# Patient Record
Sex: Male | Born: 1935 | Race: White | Hispanic: No | Marital: Married | Smoking: Former smoker
Health system: Southern US, Community
[De-identification: ages and names within clinical notes are randomized; demographics above are authoritative.]

## PROBLEM LIST (undated history)

## (undated) DIAGNOSIS — K219 Gastro-esophageal reflux disease without esophagitis: Secondary | ICD-10-CM

## (undated) DIAGNOSIS — M1990 Unspecified osteoarthritis, unspecified site: Secondary | ICD-10-CM

## (undated) DIAGNOSIS — F0390 Unspecified dementia without behavioral disturbance: Secondary | ICD-10-CM

## (undated) DIAGNOSIS — A048 Other specified bacterial intestinal infections: Secondary | ICD-10-CM

## (undated) DIAGNOSIS — K224 Dyskinesia of esophagus: Secondary | ICD-10-CM

## (undated) DIAGNOSIS — F419 Anxiety disorder, unspecified: Secondary | ICD-10-CM

## (undated) DIAGNOSIS — K449 Diaphragmatic hernia without obstruction or gangrene: Secondary | ICD-10-CM

## (undated) DIAGNOSIS — I2510 Atherosclerotic heart disease of native coronary artery without angina pectoris: Secondary | ICD-10-CM

## (undated) DIAGNOSIS — E039 Hypothyroidism, unspecified: Secondary | ICD-10-CM

## (undated) DIAGNOSIS — J449 Chronic obstructive pulmonary disease, unspecified: Secondary | ICD-10-CM

## (undated) DIAGNOSIS — I509 Heart failure, unspecified: Secondary | ICD-10-CM

## (undated) DIAGNOSIS — D369 Benign neoplasm, unspecified site: Secondary | ICD-10-CM

## (undated) DIAGNOSIS — E119 Type 2 diabetes mellitus without complications: Secondary | ICD-10-CM

## (undated) DIAGNOSIS — Z9981 Dependence on supplemental oxygen: Secondary | ICD-10-CM

## (undated) DIAGNOSIS — R1310 Dysphagia, unspecified: Secondary | ICD-10-CM

## (undated) DIAGNOSIS — R0602 Shortness of breath: Secondary | ICD-10-CM

## (undated) DIAGNOSIS — K8689 Other specified diseases of pancreas: Secondary | ICD-10-CM

## (undated) DIAGNOSIS — N200 Calculus of kidney: Secondary | ICD-10-CM

## (undated) DIAGNOSIS — I209 Angina pectoris, unspecified: Secondary | ICD-10-CM

## (undated) DIAGNOSIS — K52839 Microscopic colitis, unspecified: Secondary | ICD-10-CM

## (undated) DIAGNOSIS — K648 Other hemorrhoids: Secondary | ICD-10-CM

## (undated) DIAGNOSIS — I739 Peripheral vascular disease, unspecified: Secondary | ICD-10-CM

## (undated) DIAGNOSIS — J189 Pneumonia, unspecified organism: Secondary | ICD-10-CM

## (undated) DIAGNOSIS — K635 Polyp of colon: Secondary | ICD-10-CM

## (undated) DIAGNOSIS — D649 Anemia, unspecified: Secondary | ICD-10-CM

## (undated) DIAGNOSIS — I255 Ischemic cardiomyopathy: Secondary | ICD-10-CM

## (undated) DIAGNOSIS — K222 Esophageal obstruction: Secondary | ICD-10-CM

## (undated) DIAGNOSIS — I10 Essential (primary) hypertension: Secondary | ICD-10-CM

## (undated) DIAGNOSIS — Z7409 Other reduced mobility: Secondary | ICD-10-CM

## (undated) DIAGNOSIS — C801 Malignant (primary) neoplasm, unspecified: Secondary | ICD-10-CM

## (undated) DIAGNOSIS — E785 Hyperlipidemia, unspecified: Secondary | ICD-10-CM

## (undated) HISTORY — DX: Polyp of colon: K63.5

## (undated) HISTORY — DX: Hyperlipidemia, unspecified: E78.5

## (undated) HISTORY — DX: Unspecified dementia without behavioral disturbance: F03.90

## (undated) HISTORY — DX: Unspecified osteoarthritis, unspecified site: M19.90

## (undated) HISTORY — PX: COLON SURGERY: SHX602

## (undated) HISTORY — DX: Malignant (primary) neoplasm, unspecified: C80.1

## (undated) HISTORY — DX: Calculus of kidney: N20.0

## (undated) HISTORY — DX: Other specified diseases of pancreas: K86.89

## (undated) HISTORY — PX: CATARACT EXTRACTION, BILATERAL: SHX1313

## (undated) HISTORY — DX: Other hemorrhoids: K64.8

## (undated) HISTORY — DX: Microscopic colitis, unspecified: K52.839

## (undated) HISTORY — DX: Esophageal obstruction: K22.2

## (undated) HISTORY — DX: Diaphragmatic hernia without obstruction or gangrene: K44.9

## (undated) HISTORY — DX: Other specified bacterial intestinal infections: A04.8

## (undated) HISTORY — DX: Dyskinesia of esophagus: K22.4

## (undated) HISTORY — DX: Pneumonia, unspecified organism: J18.9

## (undated) HISTORY — DX: Peripheral vascular disease, unspecified: I73.9

## (undated) HISTORY — PX: APPENDECTOMY: SHX54

## (undated) HISTORY — DX: Benign neoplasm, unspecified site: D36.9

## (undated) HISTORY — DX: Essential (primary) hypertension: I10

---

## 2001-02-24 DIAGNOSIS — C801 Malignant (primary) neoplasm, unspecified: Secondary | ICD-10-CM

## 2001-02-24 HISTORY — PX: HEMICOLECTOMY: SHX854

## 2001-02-24 HISTORY — DX: Malignant (primary) neoplasm, unspecified: C80.1

## 2001-04-24 HISTORY — PX: COLONOSCOPY: SHX174

## 2001-04-24 HISTORY — PX: ESOPHAGOGASTRODUODENOSCOPY: SHX1529

## 2001-04-27 ENCOUNTER — Inpatient Hospital Stay (HOSPITAL_COMMUNITY): Admission: RE | Admit: 2001-04-27 | Discharge: 2001-05-07 | Payer: Self-pay | Admitting: Family Medicine

## 2001-04-27 ENCOUNTER — Encounter: Payer: Self-pay | Admitting: Family Medicine

## 2001-04-27 ENCOUNTER — Ambulatory Visit (HOSPITAL_COMMUNITY): Admission: RE | Admit: 2001-04-27 | Discharge: 2001-04-27 | Payer: Self-pay | Admitting: Family Medicine

## 2002-06-06 ENCOUNTER — Ambulatory Visit (HOSPITAL_COMMUNITY): Admission: RE | Admit: 2002-06-06 | Discharge: 2002-06-06 | Payer: Self-pay | Admitting: Neurology

## 2002-06-06 ENCOUNTER — Encounter: Payer: Self-pay | Admitting: Neurology

## 2003-12-07 ENCOUNTER — Ambulatory Visit (HOSPITAL_COMMUNITY): Admission: RE | Admit: 2003-12-07 | Discharge: 2003-12-07 | Payer: Self-pay | Admitting: Family Medicine

## 2003-12-18 ENCOUNTER — Ambulatory Visit (HOSPITAL_COMMUNITY): Admission: RE | Admit: 2003-12-18 | Discharge: 2003-12-18 | Payer: Self-pay | Admitting: Urology

## 2005-04-30 ENCOUNTER — Inpatient Hospital Stay (HOSPITAL_COMMUNITY): Admission: EM | Admit: 2005-04-30 | Discharge: 2005-05-03 | Payer: Self-pay | Admitting: Emergency Medicine

## 2005-05-20 ENCOUNTER — Ambulatory Visit (HOSPITAL_COMMUNITY): Admission: RE | Admit: 2005-05-20 | Discharge: 2005-05-20 | Payer: Self-pay | Admitting: Family Medicine

## 2005-07-23 ENCOUNTER — Ambulatory Visit (HOSPITAL_COMMUNITY): Admission: RE | Admit: 2005-07-23 | Discharge: 2005-07-23 | Payer: Self-pay | Admitting: Family Medicine

## 2008-01-14 ENCOUNTER — Ambulatory Visit (HOSPITAL_COMMUNITY): Admission: RE | Admit: 2008-01-14 | Discharge: 2008-01-14 | Payer: Self-pay | Admitting: Family Medicine

## 2008-12-24 ENCOUNTER — Emergency Department (HOSPITAL_COMMUNITY): Admission: EM | Admit: 2008-12-24 | Discharge: 2008-12-24 | Payer: Self-pay | Admitting: Emergency Medicine

## 2009-02-09 ENCOUNTER — Ambulatory Visit (HOSPITAL_COMMUNITY): Admission: RE | Admit: 2009-02-09 | Discharge: 2009-02-09 | Payer: Self-pay | Admitting: Urology

## 2009-05-30 ENCOUNTER — Ambulatory Visit: Payer: Self-pay | Admitting: Orthopedic Surgery

## 2009-05-30 ENCOUNTER — Telehealth: Payer: Self-pay | Admitting: Orthopedic Surgery

## 2009-05-30 DIAGNOSIS — M1710 Unilateral primary osteoarthritis, unspecified knee: Secondary | ICD-10-CM

## 2009-06-06 ENCOUNTER — Ambulatory Visit (HOSPITAL_COMMUNITY): Admission: RE | Admit: 2009-06-06 | Discharge: 2009-06-06 | Payer: Self-pay | Admitting: Internal Medicine

## 2009-06-27 ENCOUNTER — Ambulatory Visit (HOSPITAL_COMMUNITY): Admission: RE | Admit: 2009-06-27 | Discharge: 2009-06-27 | Payer: Self-pay | Admitting: Family Medicine

## 2009-06-27 ENCOUNTER — Telehealth: Payer: Self-pay | Admitting: Orthopedic Surgery

## 2009-08-13 ENCOUNTER — Ambulatory Visit: Payer: Self-pay | Admitting: Gastroenterology

## 2009-08-13 ENCOUNTER — Encounter: Payer: Self-pay | Admitting: Internal Medicine

## 2009-08-13 DIAGNOSIS — Z8619 Personal history of other infectious and parasitic diseases: Secondary | ICD-10-CM

## 2009-08-13 DIAGNOSIS — C189 Malignant neoplasm of colon, unspecified: Secondary | ICD-10-CM

## 2009-08-20 ENCOUNTER — Encounter: Payer: Self-pay | Admitting: Internal Medicine

## 2009-08-23 ENCOUNTER — Ambulatory Visit (HOSPITAL_COMMUNITY): Admission: RE | Admit: 2009-08-23 | Discharge: 2009-08-23 | Payer: Self-pay | Admitting: Family Medicine

## 2009-08-30 ENCOUNTER — Encounter: Payer: Self-pay | Admitting: Gastroenterology

## 2009-09-04 ENCOUNTER — Ambulatory Visit: Payer: Self-pay | Admitting: Internal Medicine

## 2009-09-04 ENCOUNTER — Ambulatory Visit (HOSPITAL_COMMUNITY): Admission: RE | Admit: 2009-09-04 | Discharge: 2009-09-04 | Payer: Self-pay | Admitting: Internal Medicine

## 2009-09-04 HISTORY — PX: COLONOSCOPY: SHX174

## 2009-09-07 ENCOUNTER — Encounter (INDEPENDENT_AMBULATORY_CARE_PROVIDER_SITE_OTHER): Payer: Self-pay

## 2009-10-23 ENCOUNTER — Ambulatory Visit: Payer: Self-pay | Admitting: Internal Medicine

## 2009-10-23 DIAGNOSIS — K5289 Other specified noninfective gastroenteritis and colitis: Secondary | ICD-10-CM

## 2009-12-25 ENCOUNTER — Encounter: Payer: Self-pay | Admitting: Orthopedic Surgery

## 2010-01-22 ENCOUNTER — Encounter: Payer: Self-pay | Admitting: Urgent Care

## 2010-01-23 ENCOUNTER — Encounter: Payer: Self-pay | Admitting: Gastroenterology

## 2010-01-29 ENCOUNTER — Encounter (INDEPENDENT_AMBULATORY_CARE_PROVIDER_SITE_OTHER): Payer: Self-pay

## 2010-02-05 ENCOUNTER — Telehealth (INDEPENDENT_AMBULATORY_CARE_PROVIDER_SITE_OTHER): Payer: Self-pay

## 2010-03-26 NOTE — Assessment & Plan Note (Signed)
Summary: 30 MIN EVAL/BILAT KNEE PAIN/BRING'G FILMS/BLUE MED+MC DISC/CAF   Visit Type:  new patient Referring Provider:  self Primary Provider:  Dr. Renard Matter  CC:  bilateral knee pain.Marland Kitchen  History of Present Illness: a 77 year old male seen in [place], and they said he couldn't have surgery because he was a diabetic.  He presents with bilateral knee pain RIGHT greater than LEFT.  He complains of 6/10 pain with sharp throbbing sensation which is constant.  It came on gradually.  It is associated with swelling in the knees increased pain with walking decreased pain with rest.  He has already been treated with Synvisc injections as well as cortisone injection.  He comes in for evaluation for possible knee replacement.  Meds: Crestor, Metformin, Avapro, Metoprolol, Nifedipine, ASA, Alleviate, Plavix, Fish oil, Pyripostigmine bromide.      Allergies (verified): No Known Drug Allergies  Past History:  Past Medical History: htn diabetes  Past Surgical History: colon cancer  Family History: FH of Cancer:  Family History of Diabetes Family History Coronary Heart Disease male < 78 Family History of Arthritis  Social History: Patient is married.  retired no smoking no alcohol 16 oz of caffeine per day  Review of Systems Constitutional:  Complains of fatigue. Respiratory:  Complains of short of breath. Genitourinary:  Complains of frequency and urgency. Musculoskeletal:  Complains of swelling, stiffness, and muscle pain. Endocrine:  Complains of heat or cold intolerance. Psychiatric:  Complains of nervousness. HEENT:  Complains of blurred or double vision and watering.  Physical Exam  Additional Exam:  GEN: well developed, well nourished, normal grooming and hygiene, no deformity and normal body habitus.  Vital signs: Weight 205 pounds   height 5 feet 8 inches   pulse 78  respiratory rate 20 CDV: pulses are 1+ on the dorsalis pedis and posterior tibial artery.   There is no edema erythema or tenderness.  Lymph: normal lymph nodes   Skin: no rashes, skin lesions or open sores   NEURO: normal coordination, reflexes, sensation.   Psyche: awake, alert and oriented. Mood normal   Gait: he ambulates with a varus bilateral knee deformity and a cane  On inspection has bilateral varus knees.  He has minimal flexion contracture with 120 of flexion on the RIGHT and 125 on the LEFT.  Both knees are stable extensor mechanism strength is grade 5.   The upper extremities have normal appearance, ROM, strength and stability.      Impression & Recommendations:  Problem # 1:  OSTEOARTHRITIS, KNEES, BILATERAL, SEVERE (ICD-715.96) Assessment the patient reported some shortness of breath.  He is diabetic.  His x-rays show varus osteoarthritis the LEFT his x-ray worse than the RIGHT they're both severe.  He has calcification of the popliteal artery.  His feet look good especially being a diabetic.  However, I would like to get a cardiology consult I think if he is stressed he shortness of breath may be worse than he actually knows.  I would also like to do an ABI test on him.  I would like him to go to the joint class.   Orders: Cardiology Referral (Cardiology) New Patient Level IV ([address])  Patient Instructions: 1)  Go to joint replacement class  2)  Get ABI done on legs  3)  Cardiolgy Consult: SOB,  preop for TKA  4)  Return after cardiology consult

## 2010-03-26 NOTE — Letter (Signed)
Summary: History form  Previous notes brought by the patient   Imported By: Jacklynn Ganong 05/31/2009 16:10:43  _____________________________________________________________________  External Attachment:    Type:   Image     Comment:   External Document

## 2010-03-26 NOTE — Letter (Signed)
Summary: [facility] medical record request  [facility] medical record request   Imported By: Jacklynn Ganong 01/24/2010 13:54:33  _____________________________________________________________________  External Attachment:    Type:   Image     Comment:   External Document

## 2010-03-26 NOTE — Letter (Signed)
Summary: Previous noted brought by the patient  Previous noted brought by the patient   Imported By: Jacklynn Ganong 05/31/2009 16:11:52  _____________________________________________________________________  External Attachment:    Type:   Image     Comment:   External Document

## 2010-03-26 NOTE — Letter (Signed)
Summary: records from dr. Renard Matter  records from dr. Renard Matter   Imported By: Rosine Beat 08/20/2009 11:49:06  _____________________________________________________________________  External Attachment:    Type:   Image     Comment:   External Document

## 2010-03-26 NOTE — Letter (Signed)
Summary: insurance card-[facility]bs  insurance card-[facility]bs   Imported By: Rosine Beat 08/13/2009 16:35:39  _____________________________________________________________________  External Attachment:    Type:   Image     Comment:   External Document

## 2010-03-26 NOTE — Letter (Signed)
Summary: Plan of Care, Need to Discuss  [facility]  [address]   [place], [place] [address]   Phone: [phone]  Fax: [phone]    January 29, 2010  Howard [name] [address] [place], [place]  [address] Jun 06, 1929   Dear Howard [name],   We are writing this letter to inform you of treatment plans and/or discuss your plan of care.  We have tried several times to contact you; however, we have yet to reach you.  We ask that you please contact our office for follow-up on your gastrointestinal issues.  We can  be reached at [phone] to schedule an appointment, or to speak with someone regarding your health care needs.  Please do not neglect your health.   Sincerely,    Cloria Spring LPN  [facility] Ph: [phone]    Fax: [phone]

## 2010-03-26 NOTE — Medication Information (Signed)
Summary: BUDESINIDE CAP 3MG   BUDESINIDE CAP 3MG    Imported By: Rexene Alberts 01/23/2010 10:13:06  _____________________________________________________________________  External Attachment:    Type:   Image     Comment:   External Document  Appended Document: BUDESINIDE CAP 3MG  see addendum from yesterday

## 2010-03-26 NOTE — Progress Notes (Signed)
Summary: ABI appointment   Phone Note Outgoing Call   Call placed by: Waldon Reining,  May 30, 2009 2:37 PM Call placed to: Patient Action Taken: Appt scheduled Summary of Call: I scheduled the patient for ABI of both legs at [facility] on 06-06-09 at 9:30. Patient is aware of the appointment.

## 2010-03-26 NOTE — Progress Notes (Signed)
Summary: can he get an injection?  Phone Note Call from Patient   Summary of Call: Jesua Tamblyn ([birth year]) has had to postpone the cardiology consult due to some stomach problems.  Wants to know if he could get a cortisone  injection while he he waiting on the cardiology clearance for his knee surgery.  According to his son, his prmary care doctor has ordered  some test for his stomach problems and he is not sure when he will get to see the cardiologist. Phone # [phone] Initial call taken by: Jacklynn Ganong,  Jun 27, 2009 2:11 PM  Follow-up for Phone Call        no but he can get a pain reliever  Pharmacy ??  Follow-up by: Fuller Canada MD,  Jun 28, 2009 8:20 AM  Additional Follow-up for Phone Call Additional follow up Details #1::        Left a message to call the office Additional Follow-up by: Jacklynn Ganong,  Jun 28, 2009 3:10 PM    Additional Follow-up for Phone Call Additional follow up Details #2::    York Spaniel he did not need any more pain medicine, he is already taking Oxycodone 5/325 prescribed by Dr.McInnis Follow-up by: Jacklynn Ganong,  Jun 29, 2009 9:24 AM

## 2010-03-26 NOTE — Assessment & Plan Note (Signed)
Summary: CONSULT FOR TCS,DIARRHEA/SS   Visit Type:  Initial Consult Referring Provider:  McInnis Primary Care Provider:  McInnis  Chief Complaint:  diarrhea.  History of Present Illness: Howard [name] is a pleasant 77 y/o WM, patient of Dr. Renard Matter, who presents for further evaluation of recent diarrhea and need for TCS. He had problems with diarrhea for several months. He states it has resolved now. No melena, brbpr. Denies heartburn, dysphagia, anorexia, abd pain. He has lost 48 pounds, intentionally. CT A/P with contrast on 06/27/09-->liver cysts, heavy aortoiliac calcifications, malrotation of right kidney, with several small calcifications, heavy calcifications of splenic and renal arteries. Patients last TCS was in 2003 when he was diagnosed with colon cancer.  Labs 07/17/09: LFTs normal  Current Medications (verified): 1)  Crestor 20 Mg Tabs (Rosuvastatin Calcium) .... Take 1 Tablet By Mouth Once A Day 2)  Metformin Hcl 500 Mg Tabs (Metformin Hcl) .... Take 1 Tablet By Mouth Two Times A Day 3)  Avapro 150 Mg Tabs (Irbesartan) .... Take 1 Tablet By Mouth Once A Day 4)  Metoprolol Tartrate 25 Mg Tabs (Metoprolol Tartrate) .... Take 1 Tablet By Mouth Two Times A Day 5)  Aspir-Trin 325 Mg Tbec (Aspirin) .... Take 1 Tablet By Mouth Once A Day 6)  Cinnamon 500 Mg Tabs (Cinnamon) .... Take 1 Tablet By Mouth Two Times A Day 7)  Fish Oil 1000 Mg Caps (Omega-3 Fatty Acids) .... Take 1 Tablet By Mouth Two Times A Day 8)  Plavix 75 Mg Tabs (Clopidogrel Bisulfate) .... Take 1 Tablet By Mouth Once A Day 9)  Pyridostigmine Bromide 60 Mg Tabs (Pyridostigmine Bromide) .... As Directed 10)  Nifedipine 30 Mg Xr24h-Tab (Nifedipine) .... One By Mouth Daily 11)  Simvastatin 40 Mg Tabs (Simvastatin) .... One By Mouth Daily 12)  Ativan 0.5 Mg Tabs (Lorazepam) 13)  Lisinopril 10 Mg Tabs (Lisinopril) .... One By Mouth Daily  Allergies (verified): No Known Drug Allergies  Past History:  Past Medical  History: Diabetes Type 2 Hypertension Pneumonia 2007 Hyperlipidemia Adenocarcinoma colon 2003 PVD - ABI 0.78 right and 0.80 left 4/11 TCS, 3/03, Dr. Rehman-->proximal trv colon fungating ulcerated mass-invasive adenocarcinoma, multiple medium diverticula EGD, 3/03-->erosive gastritis, duodenitis, H.Pylori serologies positive (?treated) Osteoarthritis of knees Kidney stone On Plavix, per patient, started by Dr. Renard Matter. No h/o CVA, stents. H/O PVD.  Past Surgical History: Right hemicolectomy 2003 Appendectomy  Family History: Family History of Diabetes, Cancer, Coronary Heart Disease male < 45 Father, deceased age 20, MI Brother, CABG Sister, deceased, aneurysm No FH of CRC.  Social History: 16 oz of caffeine per day Retired from [employer]er, fashion/clothing.  Tobacco Use - Former, quit 07 Alcohol Use - no Married. 2 children.  Review of Systems General:  Complains of weight loss; denies fever, chills, sweats, anorexia, fatigue, and weakness. Eyes:  Denies vision loss. ENT:  Denies nasal congestion, sore throat, hoarseness, and difficulty swallowing. CV:  Denies chest pains, angina, palpitations, dyspnea on exertion, and peripheral edema. Resp:  Denies dyspnea at rest, dyspnea with exercise, cough, sputum, and wheezing. GI:  See HPI. GU:  Denies urinary burning and blood in urine. MS:  Complains of joint pain / LOM. Derm:  Denies rash and itching. Neuro:  Denies weakness, frequent headaches, memory loss, and confusion. Psych:  Denies depression and anxiety. Endo:  Denies unusual weight change. Heme:  Denies bruising and bleeding. Allergy:  Denies hives and rash.  Vital Signs:  Patient profile:   77 year old male Height:  69 inches Weight:      197 pounds BMI:     29.20 Temp:     97.4 degrees F oral Pulse rate:   64 / minute BP sitting:   130 / 64  (left arm) Cuff size:   regular  Vitals Entered By: Cloria Spring LPN (August 13, 2009 1:25 PM)  Physical Exam  General:  Well  developed, well nourished, no acute distress.obese.   Head:  Normocephalic and atraumatic. Eyes:  Conjunctivae pink, no scleral icterus.  Mouth:  Oropharyngeal mucosa moist, pink.  No lesions, erythema or exudate.   dentures:.   Neck:  Supple; no masses or thyromegaly. Lungs:  Clear throughout to auscultation. Heart:  Regular rate and rhythm; no murmurs, rubs,  or bruits. Abdomen:  Bowel sounds normal.  Abdomen is soft, nontender, nondistended.  No rebound or guarding.  No hepatosplenomegaly, masses or hernias.  No abdominal bruits. obese.   Rectal:  deferred until time of colonoscopy.   Extremities:  No clubbing, cyanosis, edema or deformities noted. Neurologic:  Alert and  oriented x4;  grossly normal neurologically. Skin:  Intact without significant lesions or rashes. Cervical Nodes:  No significant cervical adenopathy. Psych:  Alert and cooperative. Normal mood and affect.  Impression & Recommendations:  Problem # 1:  Hx of NEOPLASM, MALIGNANT, COLON, ADENOCARCINOMA (ICD-153.9) Recent diarrhea resolved. He has h/o CRC, no TCS since his surgery in 2003. Colonoscopy to be performed in near future.  Risks, alternatives, and benefits including but not limited to the risk of reaction to medication, bleeding, infection, and perforation were addressed.  Patient voiced understanding and provided verbal consent. Will decrease his ASA to 81mg  daily for four days before. Continue Plavix.  Orders: Consultation Level IV ([address])  Problem # 2:  HELICOBACTER PYLORI GASTRITIS, HX OF (ICD-V12.79) ?treated previously? No records of treatment in our chart on D/C summary at the time.  Orders: T-Helicobactor Pylori Antigen Stool (60454) Consultation Level IV (09811) I would like to thank Dr. Renard Matter for allowing [place] to take part in the care of this nice patient.  Appended Document: CONSULT FOR TCS,DIARRHEA/SS HOLD PLAVIX. CONTINUE ASA.  Appended Document: CONSULT FOR TCS,DIARRHEA/SS Defer ASA Plavix to  Dr. Jena Gauss who will be performing the procedure.  Appended Document: CONSULT FOR TCS,DIARRHEA/SS may remain on asa and plavix for tcs

## 2010-03-26 NOTE — Assessment & Plan Note (Signed)
Summary: FU OV IN 6 WEEKS W/RMR/MILD COLITIS/SS   Visit Type:  Follow-up Visit Primary Care Provider:  McInnis  Chief Complaint:  F/U colitis.  History of Present Illness: History of right hemicolectomy for colon cancer. 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 He is due for repeat colonoscopy in 5 years. He's responded very nicely to Entocort therapy. He's taking 2- 3 mg capsules daily -  bowel function  is improved dramatically ; having one to 2 BM's daily which is back down  to baseline.  Marland KitchenHe is veryhappy.  U Current Medications (verified): 1)  Crestor 20 Mg Tabs (Rosuvastatin Calcium) .... Take 1 Tablet By Mouth Once A Day 2)  Metformin Hcl 500 Mg Tabs (Metformin Hcl) .... Take 1 Tablet By Mouth Two Times A Day 3)  Avapro 150 Mg Tabs (Irbesartan) .... Take 1 Tablet By Mouth Once A Day 4)  Metoprolol Tartrate 25 Mg Tabs (Metoprolol Tartrate) .... Take 1 Tablet By Mouth Two Times A Day 5)  Aspir-Trin 325 Mg Tbec (Aspirin) .... Take 1 Tablet By Mouth Once A Day 6)  Cinnamon 500 Mg Tabs (Cinnamon) .... Take 1 Tablet By Mouth Two Times A Day 7)  Fish Oil 1000 Mg Caps (Omega-3 Fatty Acids) .... Take 1 Tablet By Mouth Two Times A Day 8)  Plavix 75 Mg Tabs (Clopidogrel Bisulfate) .... Take 1 Tablet By Mouth Once A Day 9)  Pyridostigmine Bromide 60 Mg Tabs (Pyridostigmine Bromide) .... As Directed 10)  Nifedipine 30 Mg Xr24h-Tab (Nifedipine) .... One By Mouth Daily 11)  Simvastatin 40 Mg Tabs (Simvastatin) .... One By Mouth Daily 12)  Ativan 0.5 Mg Tabs (Lorazepam) .... As Needed 13)  Lisinopril 10 Mg Tabs (Lisinopril) .... One By Mouth Daily 14)  Entocort Ec 3 Mg Xr24h-Cap (Budesonide) .... Two Tablets Daily  Allergies (verified): No Known Drug Allergies  Past History:  Past Medical History: Last updated: 08/13/2009 Diabetes Type  2 Hypertension Pneumonia 2007 Hyperlipidemia Adenocarcinoma colon 2003 PVD - ABI 0.78 right and 0.80 left 4/11 TCS, 3/03, Dr. Rehman-->proximal trv colon fungating ulcerated mass-invasive adenocarcinoma, multiple medium diverticula EGD, 3/03-->erosive gastritis, duodenitis, H.Pylori serologies positive (?treated) Osteoarthritis of knees Kidney stone On Plavix, per patient, started by Dr. Renard Matter. No h/o CVA, stents. H/O PVD.  Past Surgical History: Last updated: 08/13/2009 Right hemicolectomy 2003 Appendectomy  Family History: Last updated: 08/13/2009 Family History of Diabetes, Cancer, Coronary Heart Disease male < 23 Father, deceased age 59, MI Brother, CABG Sister, deceased, aneurysm No FH of CRC.  Social History: Last updated: 08/13/2009 16 oz of caffeine per day Retired from Designer, fashion/clothing.  Tobacco Use - Former, quit 07 Alcohol Use - no Married. 2 children.  Risk Factors: Smoking Status: quit (06/27/2009)  Vital Signs:  Patient profile:   77 year old male Height:      69 inches Weight:      192.50 pounds BMI:     28.53 Temp:     97.8 degrees F oral Pulse rate:   56 / minute BP sitting:   154 / 66  (left arm) Cuff size:   large  Vitals Entered By: Cloria Spring LPN (October 23, 2009 9:32 AM)  Physical Exam  General:  alert conversant gentleman no acute distress Abdomen:  abdomen is obese soft nontender without appreciable mass or organomegaly  Impression & Recommendations: Impression: Pleasant 77 year old man with a history of colon cancer requiring right hemicolectomy in  2003. Findings on recent colonoscopy reassuring. 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 Hopefully, he is in remission.  Recommendations: Repeat colonoscopy in 5 years  Taper Entocort  to 1 tablet daily daily for the next 8 weeks and then come off completely.  If he has any recurrent diarrhea after stopping Entocort, he is to let me know.  Appended Document:  Orders Update    Clinical Lists Changes  Problems: Added new problem of COLITIS (ICD-558.9) Orders: Added new Service order of Est. Patient Level III (25956) - Signed      Appended Document: BUDESINIDE CAP 3MG  Please call pt, he was to finish entocort in Oct.  Thanks  Appended Document: BUDESINIDE CAP 3MG  Called, many rings and no answer.  Appended Document: BUDESINIDE CAP 3MG  Called, machine is off. Mailing letter to call.

## 2010-03-26 NOTE — Letter (Signed)
Summary: TCS ORDER  TCS ORDER   Imported By: Ave Filter 08/13/2009 15:30:04  _____________________________________________________________________  External Attachment:    Type:   Image     Comment:   External Document

## 2010-03-26 NOTE — Progress Notes (Signed)
Summary: Referral for cardiology consult.  Phone Note Outgoing Call   Call placed by: Waldon Reining,  May 30, 2009 2:40 PM Call placed to: Specialist Action Taken: Information Sent Summary of Call: I faxed a referral for this patient to Sel[facility] Batten for a cardiology consult for preop for knee surgery.

## 2010-03-28 NOTE — Progress Notes (Signed)
Summary: phone note/ referemce to the Entocort  Phone Note Call from Patient   Caller: Patient Summary of Call: Pt called after receiving a letter to call in reference to his Entocort. He said he did not hear from [place], so he got Dr. Renard Matter to give him a prescription and he has one refill on that. He said he has just started doing better, and feels that it is now helping him more. He still has problems when he doesn't eat right. Initial call taken by: Cloria Spring LPN,  February 05, 2010 10:33 AM

## 2010-03-28 NOTE — Medication Information (Signed)
Summary: BUDESINIDE CAP 3MG   BUDESINIDE CAP 3MG    Imported By: Rexene Alberts 01/22/2010 11:06:24  _____________________________________________________________________  External Attachment:    Type:   Image     Comment:   External Document  Appended Document: BUDESINIDE CAP 3MG  Please call pt, he was to finish entocort in Oct.  Thanks  Appended Document: BUDESINIDE CAP 3MG  Called, many rings and no answer.  Appended Document: BUDESINIDE CAP 3MG  Called, machine is off. Mailing letter to call.  Appended Document: BUDESINIDE CAP 3MG  Please see phone note dated 02/05/2010.

## 2010-04-26 ENCOUNTER — Encounter: Payer: Self-pay | Admitting: Gastroenterology

## 2010-05-01 ENCOUNTER — Encounter: Payer: Self-pay | Admitting: Gastroenterology

## 2010-05-01 ENCOUNTER — Ambulatory Visit (INDEPENDENT_AMBULATORY_CARE_PROVIDER_SITE_OTHER): Payer: Medicare Other | Admitting: Gastroenterology

## 2010-05-01 DIAGNOSIS — K5289 Other specified noninfective gastroenteritis and colitis: Secondary | ICD-10-CM

## 2010-05-02 ENCOUNTER — Encounter: Payer: Self-pay | Admitting: Urgent Care

## 2010-05-07 NOTE — Assessment & Plan Note (Signed)
Summary: problems with bowels/meds not working   Vital Signs:  Patient profile:   77 year old male Height:      69 inches Weight:      200.50 pounds BMI:     29.72 Temp:     97.7 degrees F oral Pulse rate:   80 / minute BP sitting:   130 / 70  (left arm) Cuff size:   large  Vitals Entered By: Cloria Spring LPN (April 30, 1608 9:58 AM)  Visit Type:  Follow-up Visit Primary Care Provider:  McInnis  Chief Complaint:  F/U .  History of Present Illness: Howard [name] is a pleasant 77 y/o male, with h/o microscopic colitis who is here for f/u. He states he has not been able to get in touch with our office since last year. Our last contact with him was in 12/11. He was diagnosed with microscopic colitis last summer and was given Entocort. We tapered him off completely but he had the medication refilled by Dr. Renard Matter. He states the medication never worked and he actually felt worse on it. He quit it again two weeks ago. He main complaint is fecal urgency with fecal incontinence. Generally happens few times per month. BM 2-3 stools per day, sometimes solid. No nocturnal BM. No brbpr. He claims a 45 pound weight loss since 07/2009 but according to our records he has gained 8 pounds. Appetite good. No heartburn or dysphagia. No abd pain.  Current Medications (verified): 1)  Metformin Hcl 500 Mg Tabs (Metformin Hcl) .... Take 1 Tablet By Mouth Two Times A Day 2)  Metoprolol Tartrate 25 Mg Tabs (Metoprolol Tartrate) .... Take 1 Tablet By Mouth Once A Day 3)  Cinnamon 500 Mg Tabs (Cinnamon) .... Take 1 Tablet By Mouth Two Times A Day 4)  Fish Oil 1000 Mg Caps (Omega-3 Fatty Acids) .... Take 1 Tablet By Mouth Two Times A Day 5)  Plavix 75 Mg Tabs (Clopidogrel Bisulfate) .... Take 1 Tablet By Mouth Once A Day 6)  Pyridostigmine Bromide 60 Mg Tabs (Pyridostigmine Bromide) .... As Directed 7)  Nifedipine 30 Mg Xr24h-Tab (Nifedipine) .... One By Mouth Daily 8)  Simvastatin 40 Mg Tabs (Simvastatin) .... One  By Mouth Daily 9)  Ativan 0.5 Mg Tabs (Lorazepam) .... As Needed 10)  Lisinopril 10 Mg Tabs (Lisinopril) .... One By Mouth Daily 11)  Aspirin 81 Mg Tbec (Aspirin) .... One Tablet Daily  Allergies (verified): No Known Drug Allergies  Review of Systems      See HPI  Physical Exam  General:  Well developed, well nourished, no acute distress.obese.   Head:  Normocephalic and atraumatic. Eyes:  sclera nonicteric Mouth:  moist Abdomen:  Obese. Bowel sounds normal.  Abdomen is soft, nontender, nondistended.  No rebound or guarding.  No hepatosplenomegaly, masses or hernias.  No abdominal bruits.  Extremities:  No clubbing, cyanosis, edema or deformities noted. Neurologic:  Alert and  oriented x4;  grossly normal neurologically. Skin:  Intact without significant lesions or rashes. Psych:  Alert and cooperative. Normal mood and affect.   Impression & Recommendations:  Problem # 1:  COLITIS (ICD-558.9)  Ongoing issues but main concern of fecal incontinence related to fecal urgency. Patient previously responded well to Entocort per 8/11 OV note but patient denies improvement. Refuses to take Entocort again. Long discussion regarding options with the patient. At this point, offered off-label use of Asacol HD 800mg  three times a day. Samples for 20 days provided. He will call for RX  if medication helpful. Reviewed labs from PCP, recent Creatinine 0.82. OV in 3 months with Dr. Jena Gauss. He is to call sooner if symptoms do not improve.   Orders: Est. Patient Level II (74259)   Orders Added: 1)  Est. Patient Level II [56387]  Appended Document: problems with bowels/meds not working 3 MONTH F/U OPV IS IN THE COMPUTER

## 2010-05-12 LAB — GLUCOSE, CAPILLARY: Glucose-Capillary: 125 mg/dL — ABNORMAL HIGH (ref 70–99)

## 2010-05-12 LAB — FECAL LACTOFERRIN, QUANT

## 2010-05-12 LAB — STOOL CULTURE

## 2010-05-12 LAB — OVA AND PARASITE EXAMINATION

## 2010-05-12 LAB — CLOSTRIDIUM DIFFICILE EIA

## 2010-05-30 LAB — URINE CULTURE: Culture: NO GROWTH

## 2010-05-30 LAB — URINE MICROSCOPIC-ADD ON

## 2010-05-30 LAB — URINALYSIS, ROUTINE W REFLEX MICROSCOPIC
Glucose, UA: NEGATIVE mg/dL
Leukocytes, UA: NEGATIVE
Protein, ur: 100 mg/dL — AB
Specific Gravity, Urine: 1.025 (ref 1.005–1.030)
pH: 6.5 (ref 5.0–8.0)

## 2010-06-06 ENCOUNTER — Encounter: Payer: Self-pay | Admitting: Internal Medicine

## 2010-07-12 NOTE — Group Therapy Note (Signed)
[facility]  Patient:    Howard [name], Howard [name] Visit Number: [id number] MRN: [id number]          Service Type: MED Location: 2A A214 01 Attending Physician:  [name] Dictated by:   Butch Penny, M.D. Admit Date:  04/27/2001 Discharge Date: 05/07/2001                               Progress Note  SUBJECTIVE:  This patient continues to be improved.  He had his NG tube clamped.  Apparently had a reasonably good night.  OBJECTIVE:  VITAL SIGNS:  Blood pressure 138/64, respirations 24, pulse 84, temperature 98.  LUNGS:  Coarse breath sounds.  HEART:  Regular rhythm.  ABDOMEN:  Slightly distended.  ASSESSMENT:  Patient is in his third postoperative day following right hemicolectomy for removal of neoplasm in right colon.  PLAN:  Continue current regimen. Dictated by:   Butch Penny, M.D. Attending Physician:  [name] DD:  05/05/01 TD:  05/05/01 Job: [id number]
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
## 2010-07-12 NOTE — Group Therapy Note (Signed)
[facility]  Patient:    Howard [name], Howard [name] Visit Number: [id number] MRN: [id number]          Service Type: OUT Location: RAD Attending Physician:  [name] Dictated by:   Butch Penny, M.D. Proc. Date: 05/03/01 Admit Date:  04/27/2001 Discharge Date: 04/27/2001                               Progress Note  SUBJECTIVE:  This patient is in his second postoperative day following a hemicolectomy.  He did receive 2 units of blood yesterday, which he tolerated well.  OBJECTIVE:  Vital signs:  Blood pressure 157/73, respirations 20, pulse 98, temperature 99.2 degrees.  Lungs:  Clear to P&A.  Heart:  A regular rhythm. Abdomen:  Soft, nondistended.  A midline incision.  ASSESSMENT:  The patients condition is stable.  He had a hemicolectomy on Saturday, to remove a tumor from the proximal transverse colon.  PLAN:  To continue the current regimen. Dictated by:   Butch Penny, M.D. Attending Physician:  [name] DD:  05/03/01 TD:  05/03/01 Job: [id number]

## 2010-07-12 NOTE — Op Note (Signed)
[facility]  Patient:    Howard [name], Howard [name] Visit Number: [id number] MRN: [id number]          Service Type: OUT Location: RAD Attending Physician:  [name] Dictated by:   Elpidio Anis, M.D. Proc. Date: 05/01/01 Admit Date:  04/27/2001 Discharge Date: 04/27/2001   CC:         Butch Penny, M.D.  Lionel December, M.D.   Operative Report  PREOPERATIVE DIAGNOSIS:  Adenocarcinoma of colon.  POSTOPERATIVE DIAGNOSIS:  Adenocarcinoma of right colon.  OPERATION:  Right hemicolectomy.  SURGEON:  Elpidio Anis, M.D.  DESCRIPTION OF PROCEDURE:  Under general anesthesia, the patients abdomen was prepped and draped in a sterile field.  Midline incision was made. Exploration revealed a large volume of omental fat and peritoneal fat.  There was a constricting apple-core type lesion of the hepatic flexure.  There did not   seem to be any clinical nodal enlargement in the mesentery draining this area. The liver was normal.  The porta hepatis revealed no disease. Gallbladder and stomach were normal.  The right colon was mobilized along with the terminal ileum.  The right colon was transected distal to the lesion at about mid transverse colon using a GIA stapler.  The terminal ileum was transected using a GIA-60 stapler.  The mesentery was divided using LDS stapler and Kelly clamps.  Vessels were tied with double ligatures of 2-0 silk.  Anastomosis was performed using a GIA-60 stapler and a TA55 stapler.  The staple line was reinforced using running 3-0 Prolene.  Mesenteric defect was closed with interrupted silk.  Irrigation was carried out.  Gloves were changed.  Hemostasis was felt to be adequate.  The sponge, needle, instrument, and blade counts were verified as correct.  The incision was closed using #1 Prolene on the fascia and staples on the skin. The patient tolerated the procedure well.  A sterile dressing was placed.  He was awakened from anesthesia  uneventfully, transferred to a bed, and taken to the postanesthesia care unit. Dictated by:   Elpidio Anis, M.D. Attending Physician:  [name] DD:  05/01/01 TD:  05/03/01 Job: [id number]

## 2010-07-12 NOTE — H&P (Signed)
NAME:  Howard [name], Howard [name]                 ACCOUNT NO.:  [id number]   MEDICAL RECORD NO.:  [id number]          PATIENT TYPE:  INP   LOCATION:  A204                          FACILITY:  [facility]   PHYSICIAN:  Angus G. Renard Matter, MD   DATE OF BIRTH:  Dec 30, 1929   DATE OF ADMISSION:  04/30/2005  DATE OF DISCHARGE:  LH                                HISTORY & PHYSICAL   A 77 year old white male admitted through the ED with the chief complaint  being shortness of breath, cough, chills and fever which had been present  for a period of about three days. He was seen by ED physician and evaluated.  Chest x-ray showed evidence of left lower lobe infiltrate with mild  cardiomegaly.   LABORATORY DATA:  On admission, CBC:  WBC 13,000, hemoglobin 13.3,  hematocrit 38.7, 84% neutrophils, absolute granulocytes 10.9, lymphocytes  8%. Chemistries:  Sodium 135, potassium 3.7, chloride 101, CO2 30, glucose  205, BUN 10, creatinine 0.9, calcium 8.6. Pulse oximetry 94%. It was felt  the patient should be admitted to the hospital for the treatment of  bronchopneumonia. A BNP was 219.   SOCIAL HISTORY:  The patient does not smoke, stopped in 1999. Does not use  alcohol.   FAMILY HISTORY:  See previous record.   PAST MEDICAL AND SURGICAL HISTORY:  The patient does have a  history of  hypertension, hyperlipidemia. He had bronchopneumonia treated in the  hospital in 1999. Surgeries include CA of the colon in 2003.   ALLERGIES:  The patient has no known allergies.   MEDICATIONS:  1.  Crestor 20 mg daily.  2.  Aspirin 325 mg daily.   PHYSICAL EXAMINATION:  GENERAL:  Alert, white male with blood pressure  158/76, pulse 107, respirations 28, temperature 101.7.  HEENT:  Eyes:  PERRLA. TMs negative. Oropharynx benign.  NECK:  Supple. No JVD or thyroid abnormalities.  LUNGS:  Rhonchi over the left lower lobe. Diminished air movement, prolonged  expiration.  HEART:  Regular rhythm. No murmurs. No cardiomegaly.  ABDOMEN:   No palpable organs or masses.  SKIN:  Warm and dry.  EXTREMITIES:  Free of edema.  NEUROLOGICAL:  No focal deficit.   DIAGNOSIS:  Bronchopneumonia, left lower lobe. Mild cardiomegaly. Suggestion  of congestive heart failure.      Angus G. Renard Matter, MD  Electronically Signed     AGM/MEDQ  D:  04/30/2005  T:  04/30/2005  Job:  [id number]

## 2010-07-12 NOTE — Discharge Summary (Signed)
[facility]  Patient:    Howard [name], Howard [name] Visit Number: [id number] MRN: [id number]          Service Type: MED Location: 2A A214 01 Attending Physician:  [name] Dictated by:   Elpidio Anis, M.D. Admit Date:  04/27/2001 Discharge Date: 05/07/2001   CC:         Ladona Horns. Mariel Sleet, M.D.  Butch Penny, M.D.  Valera Castle, M.D.   Discharge Summary  DISCHARGE DIAGNOSES: 1. Moderately differentiated adenocarcinoma of the right colon. 2. Severe anemia. 3. Hypertension. 4. Gastroduodenitis.  SPECIAL PROCEDURES:  Right colectomy on May 01, 2001.  DISPOSITION:  The patient is discharged home in stable and satisfactory condition.  Plans were made for follow up in the office on March 21.  DISCHARGE MEDICATIONS: 1. Toprol XL 50 mg every day. 2. Zocor 20 mg q.h.s. 3. Tylox 1-2 q.4h. as needed for pain.  SUMMARY:  This 77 year old male admitted to the hospital on March 4 complaining of chest pain associated with shortness of breath.  He had weakness and dizziness and increase in fatigue over a weeks period.  He was seen by Dr. Renard Matter and admitted, after he was evaluated.  He had hemoglobin 7.2 with hematocrit of 23.9.  He had an elevated troponin of 0.9 but a normal CPK MB.  The patient was admitted and started on transfusion.  He had a CT of his chest that was basically normal except for ______.  Evaluation by cardiology was unremarkable.  He was transfused 2 units of packed cells.  He did very well after that and underwent endoscopy.  Endoscopy revealed a lesion of the hepatic flexure suggestive of extensive adenocarcinoma.  He was seen in consultation by me, shortly thereafter, and was scheduled for surgery.  He underwent surgery on March 8.  At the time of surgery he was found to have an apple core constrictive lesion at the hepatic flexure without any clinically involved nodes and no peritoneal disease.  He did well from surgery, and had  minimal blood loss.  He did well in the postoperative period, though he did receive 2 units of packed cells on the first postoperative day.  He had mild volume overload which responded to Lasix infusion with a brisk diuresis.  He had good return of bowel function on the third postoperative day and then nasogastric tube was discontinued on the fourth postoperative day.  He tolerated an advanced diet and was discharged home in satisfactory condition on March 14.  Final pathology:  Invasive, moderately differentiated adenocarcinoma with invasion into the muscularis propria and the subserosa. Dictated by:   Elpidio Anis, M.D. Attending Physician:  [name] DD:  06/22/01 TD:  06/23/01 Job: [id number]

## 2010-07-12 NOTE — Group Therapy Note (Signed)
NAME:  Howard [name], Howard [name]                 ACCOUNT NO.:  [id number]   MEDICAL RECORD NO.:  [id number]          PATIENT TYPE:  INP   LOCATION:  A204                          FACILITY:  [facility]   PHYSICIAN:  Angus G. Renard Matter, MD   DATE OF BIRTH:  1929-11-05   DATE OF PROCEDURE:  05/01/2005  DATE OF DISCHARGE:                                   PROGRESS NOTE   SUBJECTIVE:  This patient was admitted to the ED with a chief complaint of  shortness of breath, cough, chills, fever.  He was noted to have a left  lower lobe pneumonia, mild cardiomegaly, mild elevation of BNP.  The patient  remains afebrile.   OBJECTIVE:  VITAL SIGNS:  Blood pressure 156/70, respirations 24, pulse 112,  temperature 97.5.  Most recent lab work, WBC 50,100 with hemoglobin 11.8,  hematocrit 35.0, glucose 280.  There is no generalized weakness.  LUNGS:  Diminished breath sounds.  HEART:  Regular rhythm.  ABDOMEN:  No palpable organs or masses.   ASSESSMENT:  The patient was admitted with left lower lobe pneumonia,  possible CHF.   PLAN:  Continue current IV antibiotics and current medications.  Will repeat  chest x-ray tomorrow.  Continue nebulizer treatments, etc.      Angus G. Renard Matter, MD  Electronically Signed     AGM/MEDQ  D:  05/01/2005  T:  05/01/2005  Job:  [id number]

## 2010-07-12 NOTE — Discharge Summary (Signed)
NAME:  Howard [name], Howard [name]                 ACCOUNT NO.:  [id number]   MEDICAL RECORD NO.:  [id number]          PATIENT TYPE:  INP   LOCATION:  A204                          FACILITY:  [facility]   PHYSICIAN:  Catalina Pizza, M.D.        DATE OF BIRTH:  Dec 25, 1929   DATE OF ADMISSION:  04/30/2005  DATE OF DISCHARGE:  03/10/2007LH                                 DISCHARGE SUMMARY   DISCHARGE DIAGNOSES:  1.  Left lower lobe bronchopneumonia.  2.  Diabetes mellitus type 2 (new diagnosis).  3.  Cardiomegaly, question of mild congestive heart failure.  4.  Mild anemia.  5.  Shortness of breath.  6.  Hyperlipidemia.   DISCHARGE MEDICATIONS:  1.  Metoprolol 25 mg p.o. b.i.d. two times a day.  2.  Crestor 10 mg two tabs once daily.  3.  Aspirin 325 mg p.o. daily.  4.  Fish oil two tablets p.o. daily.  5.  Avelox 400 mg take one daily x7 days.  6.  Furosemide 40 mg take once daily.  7.  KCl 20 mEq take once daily.  8.  Mucinex 600 mg twice daily.  9.  Codimal DH one teaspoon q.6 h p.r.n. for cough.  10. Combivent inhaler q.6 h for wheezing and shortness of breath.  11. Lantus 10 units subcu at bedtime.   BRIEF HOSPITAL COURSE:  Howard [name] is a 77 year old white male who was  admitted through the emergency department with a chief complaint of  shortness of breath, cough and chills and fever for approximately 3 days  prior to admission was found to have left lower lobe infiltrate and mild  cardiomegaly and was admitted for further workup and treatment.  He was  started on antibiotics and lab work was attained at that time.   LAB WORK ATTAINED DURING HOSPITALIZATION AND PRIOR TO DISCHARGE:  Last CBC  shows white count of 10.1, hemoglobin of 11.1, MCV of 95.6, platelet count  of 174.  Blood cultures x2 are negative to date.  Last BMET shows a sodium  of 136, potassium 3.6, chloride of 100, CO2 of 29, glucose of 280, BUN of  15, creatinine of 1, calcium 8.8 and BNP attained during hospitalization  showed  at initial presentation was 219.   IMAGES ATTAINED DURING HOSPITALIZATION:  Two chest x-rays which showed  cardiac enlargement and persistent left lower lobe atelectasis or  infiltrate.  It does also have some probable mild right basilar atelectasis  as well.   PHYSICAL EXAMINATION:  VITALS PRIOR TO DISCHARGE:  Blood pressure of 152/97  with rate of 86, temperature of 97.8, respirations are 16-20, saturating 93-  95% on 2 L of oxygen.  CBGs over the last day have been 204, 256, and 175,  respectively.  Weight this morning is 232.4.  GENERAL:  Obese white male sitting in bed in no acute distress.  HEENT:  Pupils equal, round, react to light and accommodation.  Oropharynx  is clear.  No thyromegaly but thick neck.  No JVD appreciated.  CHEST:  Heart regular rate and rhythm.  Did  not appreciate any murmur but  distant heart sounds.  Lungs had rhonchi at the bases bilaterally with no  accessory muscle use, occasional expiratory wheeze, but good air movement  throughout.  ABDOMEN:  Protuberant, positive bowel sounds and soft, nontender.  EXTREMITIES:  Trace lower extremity edema bilaterally.  NEUROLOGIC:  No deficits appreciated.  No decrease in sensation noted.   HOSPITAL COURSE:  Problem 1. BRONCHOPNEUMONIA.  He was treated initially  with breathing treatments, Ventolin and Atrovent, along with antibiotics,  Zithromax and Rocephin.  He has remained afebrile since his admission.  We  will transition over to Avelox 400 mg once daily x7 days for a full 10-day  course of treatment.  We will also continue with Mucinex 600 mg twice daily  and Codimal DH p.r.n. for cough as well as Combivent inhaler two puffs q.6 h  for wheezing and shortness of breath.   Problem 2. NEW ONSET OF DIABETES MELLITUS TYPE 2.  His blood sugars did  reach up into 300s at one point and was started on Lantus 10 units each  night.  We will continue on this since his blood sugars have remained in the  upper 100s to 200s  range even on this regimen.  We will get the patient back  to office to further discuss and get further workup from his blood sugar  perspective.   Problem 3. MILD CONGESTIVE HEART FAILURE.  May be contributing to some of  his shortness of breath.  He did diurese well with Lasix and will continue  on once-day Lasix at this time and will likely need a further workup for his  cardiomegaly and possible heart failure with a 2-D echo as an outpatient.  He is to continue with the aspirin as well as Crestor for his other risk  factor management and continue with the metoprolol twice a day as well.   DISPOSITION:  The patient will be discharged to home today with instructions  for use of incentive spirometry, how to use the Combivent inhaler, use of  Lantus at bedtime, and to attempt a no sugar, low carbohydrate diet and  follow up with Dr. Renard Matter within the next week.  We will give instructions  to nurse to help instruct the patient.      Catalina Pizza, M.D.  Electronically Signed     ZH/MEDQ  D:  05/03/2005  T:  05/04/2005  Job:  [id number]

## 2010-07-12 NOTE — Group Therapy Note (Signed)
[facility]  Patient:    ULICE, FOLLETT Visit Number: [id number] MRN: [id number]          Service Type: OUT Location: RAD Attending Physician:  [name] Dictated by:   Butch Penny, M.D. Proc. Date: 04/30/01 Admit Date:  04/27/2001 Discharge Date: 04/27/2001                               Progress Note  SUBJECTIVE:  This patient had a colonoscopy today, and a large fungating mass was noted at the proximal transverse colon, typical of colon carcinoma.  The patient tolerated the procedure in a satisfactory manner.  He remains stable.  OBJECTIVE:  Vital signs:  Blood pressure 149/66, respirations 20, pulse 84, temperature 97.4 degrees.  Lungs:  Clear to P&A.  Heart:  A regular rhythm. Abdomen:  No palpable organs or masses.  ASSESSMENT:  The patient was admitted with anemia and chest pain.  A colonoscopy today revealed evidence of a large fungating mass at the proximal transverse colon, a 6.0 mm polyp snared from the splenic flexure, with diverticula of the sigmoid and descending colon.  PLAN:  To obtain a surgical consultation for a hemicolectomy.  The patient has been cleared by cardiology as well for the surgery. Dictated by:   Butch Penny, M.D. Attending Physician:  [name] DD:  04/30/01 TD:  05/01/01 Job: [id number]

## 2010-07-12 NOTE — Consult Note (Signed)
[facility]  Patient:    Howard [name], Howard [name] Visit Number: [id number] MRN: [id number]          Service Type: OUT Location: RAD Attending Physician:  [name] Dictated by:   Valera Castle, M.D. Admit Date:  04/27/2001 Discharge Date: 04/27/2001   CC:         Butch Penny, M.D.   Consultation Report  REFERRING PHYSICIAN:  Butch Penny, M.D.  REASON FOR CONSULTATION:  EKG changes as well as some shortness of breath.  HISTORY OF PRESENT ILLNESS:  [name] is a 77 year old white male with no previous cardiac history who was admitted yesterday with shortness of breath. His hemoglobin was 7.2 with an MCV that was microcytic. He is undergoing GI evaluation and needs clearance.  Of note, his troponin level was 0.9, but his CPK-MBs have been negative x two.  He denies any true ischemic symptoms other than dyspnea on exertion.  Chest x-ray showed a right pleural effusion with superior mediastinal fullness with a 14 mm nodule. CT scan showed this to be mediastinal fat with no evidence of an aneurysm.  LABORATORY AND ACCESSORY DATA:  His hemoglobin now is 8.8. His electrolytes are stable. His albumin and total protein are low. UA is clean with no proteinuria.  EKG shows normal sinus rhythm with early R wave progression and lateral flattening.  PAST MEDICAL HISTORY:  Significant for no history of hypertension, diabetes, COPD, previous stroke, or MI. He has had no thyroid disease. He has had no obvious bleeding and no history of peptic ulcer disease. He was hospitalized with pneumonia five years ago.  ALLERGIES:  No known drug allergies.  ADMISSION MEDICATIONS: 1. Aspirin 81 mg a day. 2. Glucosamine. 3. Vitamin E.  SOCIAL HISTORY:  He resides with his wife of 50 years in [place]. He lives in a mobile home. He has not smoked in four years. He smoked a half a pack day for 30 years. He is retired from [employer]. He enjoys gardening. He does  not drink and use any other drugs.  FAMILY HISTORY:  His father died of a heart attack when he was 77 years of age. He has two brothers; one age 53 who has had bypass surgery. He has a sister who died at age 22 with aneurysm.  PHYSICAL EXAMINATION:  GENERAL:  In no acute distress.  VITAL SIGNS:  Blood pressure 164/78, pulse 102 and sinus tachycardia, respiratory rate 20 and unlabored, temperature afebrile. Sats are normal.  HEENT:  Unremarkable. He is slightly pale.  NECK:  No JVD. Carotid upstrokes were equal bilaterally without bruits. There is no thyroid enlargement.  LUNGS:  Clear except for decreased breath sounds in the bases.  HEART:  Regular rate and rhythm with distant heart sounds.  ABDOMEN:  Soft with good bowel sounds. There was no obvious tenderness.  EXTREMITIES:  Good femoral pulses. Dorsalis pedis, posterior tibials are present as well. There is only trace edema.  RECTAL:  Per Dr. Renard Matter was normal, but he is heme-positive.  IMPRESSION: 1. Shortness of breath, mostly multifactorial I suspect. With his    risk factors and age, he probably has some coronary disease. We need to    rule out obstructive disease after his gastrointestinal evaluation. I do    not think this is urgent. 2. Obesity. 3. Remote tobacco.  RECOMMENDATION: 1. Hold aspirin. 2. Proceed with endoscopy and colonoscopy. 3. Based on echo tomorrow for LV function. 4. Adenosine Cardiolite once GI evaluation complete.Dictated by:  Valera Castle, M.D. Attending Physician:  [name] DD:  04/28/01 TD:  04/28/01 Job: [id number]

## 2010-07-12 NOTE — Group Therapy Note (Signed)
[facility]  Patient:    Howard [name], Howard [name] Visit Number: [id number] MRN: [id number]          Service Type: OUT Location: RAD Attending Physician:  [name] Dictated by:   Butch Penny, M.D. Admit Date:  04/27/2001 Discharge Date: 04/27/2001                               Progress Note  SUBJECTIVE:  This patient was admitted to the hospital yesterday with history of anterior chest pain and elevated troponin level.  He had a CT of the chest which did not show evidence of widened superior mediastinum or adenopathy in this area.  He does have cardiomegaly and small pleural effusion.  Had low hemoglobin.  On admission received 1 unit of blood.  His condition remained stable.  OBJECTIVE:  VITAL SIGNS:  Blood pressure 129/65, respirations 18, pulse 82, temperature 98.2.  LUNGS:  Clear to P&A.  HEART:  Regular rhythm.  ABDOMEN:  No palpable organs or masses.  ASSESSMENT:  Patient admitted with chest pain, possible underlying coronary artery disease.  Does have anemia of undetermined etiology.  PLAN:  Obtain cardiology consult today followed by GI consult if needed for further evaluation of anemia. Dictated by:   Butch Penny, M.D. Attending Physician:  [name] DD:  04/28/01 TD:  04/28/01 Job: [id number]

## 2010-07-12 NOTE — Group Therapy Note (Signed)
[facility]  Patient:    Howard [name], Howard [name] Visit Number: [id number] MRN: [id number]          Service Type: MED Location: 2A A214 01 Attending Physician:  [name] Dictated by:   Butch Penny, M.D. Proc. Date: 05/06/01 Admit Date:  04/27/2001 Discharge Date: 05/07/2001                               Progress Note  SUBJECTIVE:  This patient continues to do well.  His NG tube was removed.  He has had no further nausea or vomiting.  He is five days postoperative, following a right hemicolectomy by Dr. Elpidio Anis.  Apparently he is on a full liquid diet.  OBJECTIVE:  Vital signs:  Blood pressure 149/65, respirations 24, pulse 84, temperature 98.9 degrees.  Lungs:  Clear to P&A.  Heart:  Regular rhythm. Abdomen:  Slight tenderness around the incision.  ASSESSMENT:  The patient is in the fifth day postoperative, following a right hemicolectomy for treatment of invasive adenocarcinoma.  PLAN:  To continue the current regimen.Dictated by:   Butch Penny, M.D.  Attending Physician:  [name] DD:  05/06/01 TD:  05/06/01 Job: [id number]

## 2010-07-12 NOTE — Group Therapy Note (Signed)
[facility]  Patient:    MIVAAN, CORBITT Visit Number: [id number] MRN: [id number]          Service Type: OUT Location: RAD Attending Physician:  [name] Dictated by:   Butch Penny, M.D. Proc. Date: 04/29/01 Admit Date:  04/27/2001 Discharge Date: 04/27/2001                               Progress Note  SUBJECTIVE:  This patient had an EGD today by Dr. Lionel December, which did not show evidence of gastrointestinal bleeding.  He did recommend a prep for a colonoscopy, which is to be done tomorrow.  OBJECTIVE:  Vital signs:  The patients vital signs remain stable.  Blood pressure 117/69, respirations 22, pulse 72, temperature 99 degrees.  Lungs: Diminished breath sounds.  Heart:  A regular rhythm.  Abdomen:  No palpable organs or masses.  ASSESSMENT 1. The patient was admitted with lower chest pain and dyspnea. 2. The patient was profoundly anemic. 3. The patient had rectal bleeding.  A 2-D echocardiogram was done today, apparently, but I do not have the report concerning this.  LABORATORY DATA:  The most recent hemoglobin was 8.8, hematocrit 28.1.  PLAN:  Plan to continue the current regimen.  The patient is scheduled for a colonoscopy tomorrow. Dictated by:   Butch Penny, M.D. Attending Physician:  [name] DD:  04/29/01 TD:  05/01/01 Job: [id number]

## 2010-07-12 NOTE — Group Therapy Note (Signed)
NAME:  Howard [name], Howard [name]                 ACCOUNT NO.:  [id number]   MEDICAL RECORD NO.:  [id number]          PATIENT TYPE:  INP   LOCATION:  A204                          FACILITY:  [facility]   PHYSICIAN:  Angus G. Renard Matter, MD   DATE OF BIRTH:  Aug 29, 1929   DATE OF PROCEDURE:  05/02/2005  DATE OF DISCHARGE:                                   PROGRESS NOTE   SUBJECTIVE:  The patient was admitted with the chief complaint of shortness  of breath, cough, chills and fever. He was noted to have lower lobe  pneumonia, mild cardiomegaly, mildly elevated BNP. The patient remains  afebrile, still has slight cough and congestion.   OBJECTIVE:  VITAL SIGNS:  Blood pressure 152/82, respirations 20, pulse 90,  temperature 97.8. blood sugars have up from 301 to 371. His white count has  come down some to 10,100 with hemoglobin 11.1, hematocrit 33.1. Repeat chest  x-ray showed evidence of cardiomegaly with left basal atelectasis, no  infiltrate.  GENERAL:  The patient has generalized weakness.  LUNGS:  Diminished breath sounds.  HEART:  Regular rhythm.   ASSESSMENT:  The patient was admitted with bronchopneumonia which has  improved.   PLAN:  Plan to continue current IV antibiotics. Will consider discharge.      Angus G. Renard Matter, MD  Electronically Signed     AGM/MEDQ  D:  05/02/2005  T:  05/02/2005  Job:  [id number]

## 2010-07-26 ENCOUNTER — Encounter: Payer: Self-pay | Admitting: Internal Medicine

## 2010-07-26 ENCOUNTER — Other Ambulatory Visit (INDEPENDENT_AMBULATORY_CARE_PROVIDER_SITE_OTHER): Payer: Self-pay | Admitting: Internal Medicine

## 2010-07-26 ENCOUNTER — Ambulatory Visit (INDEPENDENT_AMBULATORY_CARE_PROVIDER_SITE_OTHER): Payer: Medicare Other | Admitting: Internal Medicine

## 2010-07-26 ENCOUNTER — Other Ambulatory Visit: Payer: Self-pay | Admitting: Internal Medicine

## 2010-07-26 DIAGNOSIS — R197 Diarrhea, unspecified: Secondary | ICD-10-CM | POA: Insufficient documentation

## 2010-07-26 DIAGNOSIS — R1310 Dysphagia, unspecified: Secondary | ICD-10-CM

## 2010-07-26 NOTE — Progress Notes (Signed)
Patient returns for followup. All in all, his bowel function is good most of the time i.e. he has one formed bowel movement daily he says to 3 times monthly he may get severe abdominal cramps suddenly and have several episodes of diarrhea sometimes he has incontinence. Not had any bleeding he is up-to-date on surveillance colonoscopy. History of hemicolectomy for colon cancer. So about 95% time he has one formed bowel movement daily. History of microscopic colitis he completed a course of budesonide last year. He is diabetic as well. He denies weight loss. He's not taking antidiarrheals/anti-spasmodic spirits he is on metformin he is also on Mestinon that because of her problem with ptosis involving the left which he sees a specialist at [facility].  This nice gentleman also relates esophageal dysphagia to solids according to behind his breast bone he's had it for years but notes insidiously worsening of his symptoms over the past one year. He denies ever having evaluation.  Review of Systems:  Gen: Denies any fever, chills, sweats, anorexia, fatigue, weakness, malaise, weight loss, and sleep disorder CV: Denies chest pain, angina, palpitations, syncope, orthopnea, PND, peripheral edema, and claudication. Resp: Denies dyspnea at rest, dyspnea with exercise, cough, sputum, wheezing, coughing up blood, and pleurisy. GI: Denies vomiting blood, jaundice,  Odynophagia.; o/w as in hpi Derm: Denies rash, itching, dry skin, hives, moles, warts, or unhealing ulcers.  Psych: Denies depression, anxiety, memory loss, suicidal ideation, hallucinations, paranoia, and confusion. Heme: Denies bruising, bleeding, and enlarged lymph nodes.     Physical Exam: BP 171/70  Pulse 84  Temp(Src) 97.2 F (36.2 C) (Temporal)  Ht 5\' 8"  (1.727 m)  Wt 201 lb 3.2 oz (91.264 kg)  BMI 30.59 kg/m2 General:   Alert,  Well-developed, well-nourished, pleasant and cooperative in NAD Head:  Normocephalic and atraumatic. Eyes:   Sclera clear, no icterus.   Conjunctiva pink. Mouth:  No deformity or lesions, dentition normal. Neck:  Supple; no masses or thyromegaly. Heart:  Regular rate and rhythm; no murmurs, clicks, rubs,  or gallops. Abdomen:  Soft, nontender and nondistended. No masses, hepatosplenomegaly or hernias noted. Normal bowel sounds, without guarding, and without rebound.   Msk:  Symmetrical without gross deformities. Normal posture. Pulses:  Normal pulses noted. Extremities:  Without clubbing or edema. Neurologic:  Alert and  oriented x4;  grossly normal neurologically. Skin:  Intact without significant lesions or rashes. Cervical Nodes:  No significant cervical adenopathy. Psych:  Alert and cooperative. Normal mood and affect.

## 2010-08-01 ENCOUNTER — Ambulatory Visit (HOSPITAL_COMMUNITY)
Admission: RE | Admit: 2010-08-01 | Discharge: 2010-08-01 | Disposition: A | Discharge status: Home / Self Care | Payer: Medicare Other | Source: Ambulatory Visit | Attending: Internal Medicine | Admitting: Internal Medicine

## 2010-08-01 DIAGNOSIS — K224 Dyskinesia of esophagus: Secondary | ICD-10-CM | POA: Insufficient documentation

## 2010-08-01 DIAGNOSIS — R1310 Dysphagia, unspecified: Secondary | ICD-10-CM | POA: Insufficient documentation

## 2010-08-02 ENCOUNTER — Ambulatory Visit: Payer: Medicare Other | Admitting: Internal Medicine

## 2010-08-02 NOTE — Progress Notes (Signed)
Pt aware.

## 2010-08-02 NOTE — Progress Notes (Signed)
Pt is scheduled for EGD/ED on 08/05/10- pt informed and I went over instructions on the phone and also mailed,

## 2010-08-05 ENCOUNTER — Ambulatory Visit (INDEPENDENT_AMBULATORY_CARE_PROVIDER_SITE_OTHER): Payer: Medicare Other | Admitting: Internal Medicine

## 2010-08-05 ENCOUNTER — Ambulatory Visit (HOSPITAL_COMMUNITY)
Admission: RE | Admit: 2010-08-05 | Discharge: 2010-08-05 | Disposition: A | Discharge status: Home / Self Care | Payer: Medicare Other | Source: Ambulatory Visit | Attending: Internal Medicine | Admitting: Internal Medicine

## 2010-08-05 DIAGNOSIS — Z79899 Other long term (current) drug therapy: Secondary | ICD-10-CM | POA: Insufficient documentation

## 2010-08-05 DIAGNOSIS — K222 Esophageal obstruction: Secondary | ICD-10-CM

## 2010-08-05 DIAGNOSIS — R1310 Dysphagia, unspecified: Secondary | ICD-10-CM | POA: Insufficient documentation

## 2010-08-05 DIAGNOSIS — Z7982 Long term (current) use of aspirin: Secondary | ICD-10-CM | POA: Insufficient documentation

## 2010-08-05 DIAGNOSIS — K449 Diaphragmatic hernia without obstruction or gangrene: Secondary | ICD-10-CM

## 2010-08-05 DIAGNOSIS — K529 Noninfective gastroenteritis and colitis, unspecified: Secondary | ICD-10-CM

## 2010-08-05 DIAGNOSIS — K5289 Other specified noninfective gastroenteritis and colitis: Secondary | ICD-10-CM

## 2010-08-05 DIAGNOSIS — I10 Essential (primary) hypertension: Secondary | ICD-10-CM | POA: Insufficient documentation

## 2010-08-05 HISTORY — DX: Diaphragmatic hernia without obstruction or gangrene: K44.9

## 2010-08-05 HISTORY — PX: ESOPHAGOGASTRODUODENOSCOPY: SHX1529

## 2010-08-05 HISTORY — DX: Esophageal obstruction: K22.2

## 2010-08-05 NOTE — Progress Notes (Signed)
Patient is a pleasant 77 year old and is status post hemicolectomy for colon cancer history of microscopic colitis biopsy proven who also has diabetes and has intermittent bouts of abdominal cramps postprandial nonbloody diarrhea and incontinence. However the vast majority of the time he does very well. Clinically, his microscopic colitis is in remission. He does have a foreshortened gut. Also has diabetes. Medications may be contributing to occasional diarrhea. But he is doing well most of the time on a multiple medication regimen.  Recommendations: Utilize Imodium 1 tablet when necessary episodes of diarrhea. In fact, he could try just taking Imodium once daily and see if this diminishes episodes of diarrhea. We'll also give him samples of a probiotic in the way of Align -  take one daily.  I do not see utilizing any prescription agents for occasional diarrhea at this time or making any modifications in his current medical regimen for the occasional diarrhea.  Near the end of the encounter,  he mentioned esophageal dysphagia to solids which he's had for years which is insidiously worsening.  I told him we would go ahead and do a barium pill esophagram to further evaluate. At a minimum, will plan to see him back in the office in 3 months.

## 2010-08-15 ENCOUNTER — Ambulatory Visit (HOSPITAL_COMMUNITY)
Admission: RE | Admit: 2010-08-15 | Discharge: 2010-08-15 | Disposition: A | Discharge status: Home / Self Care | Payer: Medicare Other | Source: Ambulatory Visit | Attending: Family Medicine | Admitting: Family Medicine

## 2010-08-15 ENCOUNTER — Encounter: Payer: Self-pay | Admitting: Cardiology

## 2010-08-15 ENCOUNTER — Other Ambulatory Visit (HOSPITAL_COMMUNITY): Payer: Self-pay | Admitting: Family Medicine

## 2010-08-15 DIAGNOSIS — Z87891 Personal history of nicotine dependence: Secondary | ICD-10-CM | POA: Insufficient documentation

## 2010-08-15 DIAGNOSIS — R05 Cough: Secondary | ICD-10-CM

## 2010-08-15 DIAGNOSIS — E119 Type 2 diabetes mellitus without complications: Secondary | ICD-10-CM | POA: Insufficient documentation

## 2010-08-15 DIAGNOSIS — R0602 Shortness of breath: Secondary | ICD-10-CM | POA: Insufficient documentation

## 2010-08-16 ENCOUNTER — Ambulatory Visit (HOSPITAL_COMMUNITY)
Admission: RE | Admit: 2010-08-16 | Discharge: 2010-08-16 | Disposition: A | Discharge status: Home / Self Care | Payer: Medicare Other | Source: Ambulatory Visit | Attending: Family Medicine | Admitting: Family Medicine

## 2010-08-16 DIAGNOSIS — R0989 Other specified symptoms and signs involving the circulatory and respiratory systems: Secondary | ICD-10-CM | POA: Insufficient documentation

## 2010-08-16 DIAGNOSIS — R0609 Other forms of dyspnea: Secondary | ICD-10-CM | POA: Insufficient documentation

## 2010-08-16 DIAGNOSIS — I10 Essential (primary) hypertension: Secondary | ICD-10-CM | POA: Insufficient documentation

## 2010-08-16 DIAGNOSIS — E119 Type 2 diabetes mellitus without complications: Secondary | ICD-10-CM | POA: Insufficient documentation

## 2010-08-16 DIAGNOSIS — I359 Nonrheumatic aortic valve disorder, unspecified: Secondary | ICD-10-CM

## 2010-08-26 ENCOUNTER — Encounter: Payer: Self-pay | Admitting: Cardiology

## 2010-08-26 ENCOUNTER — Ambulatory Visit (INDEPENDENT_AMBULATORY_CARE_PROVIDER_SITE_OTHER): Payer: Medicare Other | Admitting: Cardiology

## 2010-08-26 VITALS — BP 145/60 | HR 58 | Ht 68.0 in | Wt 202.0 lb

## 2010-08-26 DIAGNOSIS — I5032 Chronic diastolic (congestive) heart failure: Secondary | ICD-10-CM

## 2010-08-26 DIAGNOSIS — I509 Heart failure, unspecified: Secondary | ICD-10-CM

## 2010-08-26 DIAGNOSIS — I429 Cardiomyopathy, unspecified: Secondary | ICD-10-CM | POA: Insufficient documentation

## 2010-08-26 MED ORDER — LISINOPRIL 20 MG PO TABS: 20.0000 mg | ORAL_TABLET | Freq: Every day | ORAL | Status: DC

## 2010-08-26 NOTE — Patient Instructions (Addendum)
**Note De-Identified  Obfuscation** Your physician has recommended you make the following change in your medication: Stop taking Plavix and Procardia. Increase Lisinopril to 20mg  daily  Your physician recommends that you return for lab work in: 1 week  Your physician has requested that you start a 2 gram sodium diet, please see handout given to you today. You may use light salt (not substitute)  Your physician recommends that you schedule a follow-up appointment in: 1 week for a blood pressure check with nurse and then as needed

## 2010-08-26 NOTE — Progress Notes (Signed)
HPI Howard [name] is a delightful 77 year old white male who comes with his son today and referred by Dr. Renard Matter for the evaluation and management of new onset diastolic heart failure.  About a week and a half ago, he awoke and with shortness of breath. He sat up and went to the bathroom and got better. He denied any chest pain, palpitations presyncope or syncope. It was no nausea vomiting or diaphoresis.  He was evaluated and was told he had an enlarged heart by chest x-ray which I reviewed today. In addition he had blood work which showed an elevated BNP of 2378. The rest of his blood work was unremarkable except for slightly low hemoglobin 11.0. Potassium is 4.5 creatinine was 0.8.  He was given furosemide and potassium supplementation. He did not have any edema prior to taking this but says that he has gotten good results. He has had no more orthopnea or PND.  He does have some baseline dyspnea on exertion from a history of smoking. Emphysema was seen on his chest x-ray.  2-D echocardiogram on June 22 showed moderate left ventricular hypertrophy , ejection fraction 55-60%, mild aortic stenosis, mild mitral regurgitation, moderately enlarged left atrium no significant mitral valve disease and normal right-sided function and size.  EKG today shows some Procardia with ST segment flattening with biphasic T waves laterally  Medications reviewed and the patient is compliant. All records reviewed. Past Medical History  Diagnosis Date  . Diabetes mellitus     type II  . Hypertension   . Pneumonia 2007  . Hyperlipidemia   . Adenocarcinoma 2003    COLON  . PVD (peripheral vascular disease)     ABI 0.78 RIGHT AND 0.80 LEFT 4/11  . Osteoarthritis     OF KNEES  . Kidney stone     Past Surgical History  Procedure Date  . Hemicolectomy 2003  . Appendectomy   . Colonoscopy 04/2001  . Esophagogastroduodenoscopy 04/2001    No family history on file.  History   Social History  . Marital  Status: Married    Spouse Name: N/A    Number of Children: N/A  . Years of Education: N/A   Occupational History  . Not on file.   Social History Main Topics  . Smoking status: Never Smoker   . Smokeless tobacco: Never Used  . Alcohol Use: No  . Drug Use: No  . Sexually Active: Not on file   Other Topics Concern  . Not on file   Social History Narrative  . No narrative on file    No Known Allergies  Current Outpatient Prescriptions  Medication Sig Dispense Refill  . albuterol (PROAIR HFA) 108 (90 BASE) MCG/ACT inhaler Inhale 2 puffs into the lungs every 6 (six) hours as needed.        Marland Kitchen aspirin 325 MG tablet Take 325 mg by mouth daily.        . Cinnamon 500 MG TABS Take by mouth 2 (two) times daily.        . furosemide (LASIX) 20 MG tablet Take 20 mg by mouth 2 (two) times daily.        Marland Kitchen LORazepam (ATIVAN) 0.5 MG tablet Take 0.5 mg by mouth as needed.        . metFORMIN (GLUCOPHAGE) 500 MG tablet Take 500 mg by mouth 2 (two) times daily.        . metoprolol tartrate (LOPRESSOR) 25 MG tablet Take 25 mg by mouth daily.       Marland Kitchen  Omega-3 Fatty Acids (FISH OIL) 1000 MG CAPS Take 1 capsule by mouth daily.        . potassium chloride SA (K-DUR,KLOR-CON) 20 MEQ tablet Take 20 mEq by mouth daily.        Marland Kitchen pyridostigmine (MESTINON) 60 MG tablet Take 60 mg by mouth. AS DIRECTED       . simvastatin (ZOCOR) 40 MG tablet Take 40 mg by mouth.        . tiotropium (SPIRIVA HANDIHALER) 18 MCG inhalation capsule Place 18 mcg into inhaler and inhale daily.        Marland Kitchen DISCONTD: clopidogrel (PLAVIX) 75 MG tablet Take 75 mg by mouth daily.        Marland Kitchen DISCONTD: lisinopril (PRINIVIL,ZESTRIL) 10 MG tablet Take 10 mg by mouth daily.       Marland Kitchen DISCONTD: NIFEdipine (PROCARDIA-XL) 30 MG (OSM) 24 hr tablet Take 30 mg by mouth daily.       Marland Kitchen DISCONTD: budesonide (ENTOCORT EC) 3 MG 24 hr capsule Take 6 mg by mouth 2 (two) times daily. TAKE TWO TABLETS DAILY        . DISCONTD: irbesartan (AVAPRO) 150 MG tablet  Take 150 mg by mouth daily.        Marland Kitchen DISCONTD: rosuvastatin (CRESTOR) 20 MG tablet Take 20 mg by mouth daily.          ROS Negative other than HPI.   PE General Appearance: well developed, well nourished in no acute distress, obese, elderly HEENT: symmetrical face, PERRLA, good dentition  Neck: no JVD, thyromegaly, or adenopathy, trachea midline Chest: symmetric without deformity Cardiac: PMI non-displaced, RRR, normal S1, S2, no gallop, systolic murmur along the left sternal border S2 splits, no diastolic murmur heard. Murmur radiates into the base of the neck. Lung: clear to ausculation and percussion Vascular: Diminished pulses in the lower extremities but reasonable capillary refill. No lesions or ulcerations. No sign of DVT  Abdominal: nondistended, nontender, good bowel sounds, no HSM, no bruits Extremities: no cyanosis, clubbing or edema, no sign of DVT, no varicosities  Skin: normal color, no rashes Neuro: alert and oriented x 3, non-focal Pysch: normal affect Filed Vitals:   08/26/10 1403  BP: 145/60  Pulse: 58  Height: 5\' 8"  (1.727 m)  Weight: 202 lb (91.627 kg)  SpO2: 95%    EKG  Labs and Studies Reviewed.   No results found for this basename: WBC, HGB, HCT, MCV, PLT      Chemistry   No results found for this basename: NA, K, CL, CO2, BUN, CREATININE, GLU   No results found for this basename: CALCIUM, ALKPHOS, AST, ALT, BILITOT       No results found for this basename: CHOL   No results found for this basename: HDL   No results found for this basename: LDLCALC   No results found for this basename: TRIG   No results found for this basename: CHOLHDL   No results found for this basename: HGBA1C   No results found for this basename: ALT, AST, GGT, ALKPHOS, BILITOT   No results found for this basename: TSH

## 2010-09-02 ENCOUNTER — Ambulatory Visit (INDEPENDENT_AMBULATORY_CARE_PROVIDER_SITE_OTHER): Payer: Medicare Other

## 2010-09-02 ENCOUNTER — Other Ambulatory Visit: Payer: Self-pay | Admitting: Cardiology

## 2010-09-02 VITALS — BP 143/63 | HR 68 | Ht 68.0 in | Wt 199.0 lb

## 2010-09-02 DIAGNOSIS — I10 Essential (primary) hypertension: Secondary | ICD-10-CM

## 2010-09-03 LAB — BASIC METABOLIC PANEL
Chloride: 103 mEq/L (ref 96–112)
Glucose, Bld: 145 mg/dL — ABNORMAL HIGH (ref 70–99)
Potassium: 4.2 mEq/L (ref 3.5–5.3)
Sodium: 140 mEq/L (ref 135–145)

## 2010-09-05 NOTE — Progress Notes (Signed)
S: 1 week nurse visit B: office visit on 08/26/10, d/c plavix and procardia and increased lisinopril to 20mg  daily A: pt without any acute problems at visit R": asked to continue tx as outlined at office visit and follow up as planned

## 2010-09-09 NOTE — Op Note (Signed)
  NAME:  Howard [name], Howard [name]                 ACCOUNT NO.:  [id number]  MEDICAL RECORD NO.:  [id number]  LOCATION:  DAYP                          FACILITY:  [facility]  PHYSICIAN:  R. Roetta Sessions, MD FACP FACGDATE OF BIRTH:  08/08/1929  DATE OF PROCEDURE:  08/05/2010 DATE OF DISCHARGE:                              OPERATIVE REPORT   INDICATIONS FOR PROCEDURE:  A 77 year old gentleman with history of GERD, history of esophageal dysphagia to solids, history of microscopic colitis in remission.  Recent barium pill esophagram demonstrates esophageal dysmotility narrowing at the GE junction which obstructed passage of 13-mm pill.  Consequently, he has been offered EGD with esophageal dilation as appropriate.  Risks, benefits, limitations, alternatives and imponderables have been discussed, questions answered. Please see the documentation in the medical record.  PROCEDURE NOTE:  O2 saturation, blood pressure, pulse and respirations were monitored throughout the entire procedure.  CONSCIOUS SEDATION: 1. Versed 5 mg IV. 2. Demerol 75 mg IV in divided doses. 3. Cetacaine spray for topical pharyngeal anesthesia.  INSTRUMENT:  Pentax video chip system.  FINDINGS:  Examination of the tubular esophagus revealed noncritical Schatzki ring, otherwise esophageal mucosa appeared normal.  EGD junction easily traversed.  Stomach:  Gastric cavity was empty and insufflated well with air.  Thorough examination of the gastric mucosa including retroflexed view of the proximal stomach, esophagogastric junction demonstrated only a small hiatal hernia.  Pylorus was patent, easily traversed.  Examination of the bulb and second portion revealed no abnormalities.  THERAPEUTIC/DIAGNOSTIC MANEUVERS PERFORMED:  Scope was withdrawn.  A 56- French Maloney dilators were passed to full insertion with ease. Subsequently, 58-[facility] dilator was passed to full insertion with slight resistance.  A look back revealed the  ring had been nicely ruptured with a tear across it at the 10 o'clock position with minimal bleeding without apparent complication.  The patient tolerated the procedure well.  IMPRESSION: 1. Noncritical-appearing Schatzki ring otherwise normal esophagus,     status post dilation and disruption as described above. 2. Small hiatal hernia, otherwise normal stomach, D1 and D2.  RECOMMENDATIONS: 1. The patient needs to be on chronic acid suppression therapy and     started on Protonix 40 mg orally daily.  Prescription provided. 2. Office visit with [place] in 6 months.     Jonathon Bellows, MD Caleen Essex     RMR/MEDQ  D:  08/05/2010  T:  08/06/2010  Job:  [id number]  Electronically Signed by Lorrin Goodell M.D. on 09/09/2010 08:39:43 AM

## 2010-10-29 ENCOUNTER — Telehealth: Payer: Self-pay | Admitting: Gastroenterology

## 2010-10-29 NOTE — Telephone Encounter (Signed)
Patient has appt Friday with RMR. Saw his wife in appt today and patient asked we call something in for diarrhea. Reviewed his chart. Seen in 07/2010 by RMR (patient didn't remember this). H/O microscopic colitis. C/O pp loose stools.  Please call patient. Let him know, he should take Imodium 2mg  every morning and up to TID. Hold for constipation. Keep appt with RMR. He may consider medication for microscopic colitis if symptoms warrant.

## 2010-10-29 NOTE — Telephone Encounter (Signed)
Pt aware.

## 2010-11-01 ENCOUNTER — Ambulatory Visit (INDEPENDENT_AMBULATORY_CARE_PROVIDER_SITE_OTHER): Payer: Medicare Other | Admitting: Internal Medicine

## 2010-11-01 ENCOUNTER — Encounter: Payer: Self-pay | Admitting: Internal Medicine

## 2010-11-01 DIAGNOSIS — R1314 Dysphagia, pharyngoesophageal phase: Secondary | ICD-10-CM

## 2010-11-01 DIAGNOSIS — R1310 Dysphagia, unspecified: Secondary | ICD-10-CM

## 2010-11-01 DIAGNOSIS — K5289 Other specified noninfective gastroenteritis and colitis: Secondary | ICD-10-CM

## 2010-11-01 NOTE — Progress Notes (Signed)
Primary Care Physician:  No primary provider on file. Primary Gastroenterologist:  Dr.   Pre-Procedure History & Physical: HPI:  Howard [name] is a 77 y.o. male here for recurrent diarrhea. He has a history of microscopic colitis. History of colon cancer status post right hemicolectomy. Most recent colonoscopy demonstrated not much in way of any histologic evidence of active colitis. He came to see [place] earlier in the week along with his wife (his wife as the patient). Miss Lewis recommended he get back on Imodium as previously prescribed. He states this has not helped. He has upper to 5-6 bowel movements daily sometimes he doesn't get much warning and can have some accidents. He has not been on antibiotics this year. I saw him earlier in the year for dysphagia he underwent dilation of Schatzki's ring. He states he still has some dysphagia but symptoms don't come out very often. GERD symptoms well controlled on protonix.  Past Medical History  Diagnosis Date  . Diabetes mellitus     type II  . Hypertension   . Pneumonia 2007  . Hyperlipidemia   . Adenocarcinoma 2003    COLON  . PVD (peripheral vascular disease)     ABI 0.78 RIGHT AND 0.80 LEFT 4/11  . Osteoarthritis     OF KNEES  . Kidney stone     Past Surgical History  Procedure Date  . Hemicolectomy 2003  . Appendectomy   . Colonoscopy 04/2001  . Esophagogastroduodenoscopy 04/2001    Prior to Admission medications   Medication Sig Start Date End Date Taking? Authorizing Provider  albuterol (PROAIR HFA) 108 (90 BASE) MCG/ACT inhaler Inhale 2 puffs into the lungs every 6 (six) hours as needed.     Yes Historical Provider, MD  aspirin 325 MG tablet Take 325 mg by mouth daily.     Yes Historical Provider, MD  Cinnamon 500 MG TABS Take by mouth 2 (two) times daily.     Yes Historical Provider, MD  furosemide (LASIX) 20 MG tablet Take 20 mg by mouth 2 (two) times daily.     Yes Historical Provider, MD  lisinopril (PRINIVIL,ZESTRIL) 20 MG  tablet Take 1 tablet (20 mg total) by mouth daily. 08/26/10 08/26/11 Yes Valera Castle, MD  loperamide (IMODIUM) 2 MG capsule Take 2 mg by mouth as needed.  10/29/10  Yes Historical Provider, MD  LORazepam (ATIVAN) 0.5 MG tablet Take 0.5 mg by mouth as needed.     Yes Historical Provider, MD  metFORMIN (GLUCOPHAGE) 500 MG tablet Take 500 mg by mouth 2 (two) times daily.     Yes Historical Provider, MD  metoprolol (LOPRESSOR) 50 MG tablet Take 50 mg by mouth 2 (two) times daily.  10/30/10  Yes Historical Provider, MD  metoprolol tartrate (LOPRESSOR) 25 MG tablet Take 25 mg by mouth daily.    Yes Historical Provider, MD  Omega-3 Fatty Acids (FISH OIL) 1000 MG CAPS Take 1 capsule by mouth daily.     Yes Historical Provider, MD  pantoprazole (PROTONIX) 40 MG tablet  10/04/10  Yes Historical Provider, MD  potassium chloride SA (K-DUR,KLOR-CON) 20 MEQ tablet Take 20 mEq by mouth daily.     Yes Historical Provider, MD  pyridostigmine (MESTINON) 60 MG tablet Take 60 mg by mouth. AS DIRECTED    Yes Historical Provider, MD  simvastatin (ZOCOR) 40 MG tablet Take 40 mg by mouth.     Yes Historical Provider, MD  tiotropium (SPIRIVA) 18 MCG inhalation capsule Place 18 mcg into inhaler and  inhale daily.     Yes Historical Provider, MD    Allergies as of 11/01/2010  . (No Known Allergies)    No family history on file.  History   Social History  . Marital Status: Married    Spouse Name: N/A    Number of Children: N/A  . Years of Education: N/A   Occupational History  . Not on file.   Social History Main Topics  . Smoking status: Never Smoker   . Smokeless tobacco: Never Used  . Alcohol Use: No  . Drug Use: No  . Sexually Active: Not on file   Other Topics Concern  . Not on file   Social History Narrative  . No narrative on file    Review of Systems: See HPI, otherwise negative ROS  Physical Exam: BP 124/59  Pulse 63  Temp(Src) 97 F (36.1 C) (Temporal)  Ht 5\' 8"  (1.727 m)  Wt 198 lb 3.2 oz  (89.903 kg)  BMI 30.14 kg/m2 General:   Alert,  Well-developed, well-nourished, pleasant and cooperative in NAD Head:  Normocephalic and atraumatic. Eyes:  Sclera clear, no icterus.   Conjunctiva pink. Ears:  Normal auditory acuity. Nose:  No deformity, discharge,  or lesions. Mouth:  No deformity or lesions, dentition normal. Neck:  Supple; no masses or thyromegaly. Lungs:  Clear throughout to auscultation.   No wheezes, crackles, or rhonchi. No acute distress. Heart:  Regular rate and rhythm; no murmurs, clicks, rubs,  or gallops. Abdomen:  Soft, nontender and nondistended. No masses, hepatosplenomegaly or hernias noted. Normal bowel sounds, without guarding, and without rebound.   Msk:  Symmetrical without gross deformities. Normal posture. Pulses:  Normal pulses noted. Extremities:  Without clubbing or edema. Neurologic:  Alert and  oriented x4;  grossly normal neurologically. Skin:  Intact without significant lesions or rashes. Cervical Nodes:  No significant cervical adenopathy. Psych:  Alert and cooperative. Normal mood and affect.  Impression/Plan:

## 2010-11-01 NOTE — Patient Instructions (Signed)
Take 2 (3 mg) Entocort tablets daily for the next 6 weeks.   Return appointment to see me in 6 weeks   Call if diarrhea not much better in the next 7-10 days

## 2010-12-20 ENCOUNTER — Ambulatory Visit (INDEPENDENT_AMBULATORY_CARE_PROVIDER_SITE_OTHER): Payer: Medicare Other | Admitting: Internal Medicine

## 2010-12-20 VITALS — BP 155/65 | HR 61 | Temp 98.2°F | Ht 68.0 in | Wt 194.0 lb

## 2010-12-20 DIAGNOSIS — K5289 Other specified noninfective gastroenteritis and colitis: Secondary | ICD-10-CM

## 2010-12-20 NOTE — Assessment & Plan Note (Signed)
Overall diarrhea much better with a course of Entocort 6 mg daily. He does have occasional breakthrough diarrhea but this may be more durable bowel syndrome than anything else.  Recommendations: Advised patient to decrease his Entocort to 3 mg daily and continue this regimen for 3 months until he returns for followup appointment.  Should he have any breakthrough symptoms he is urged to utilize Imodium.

## 2010-12-20 NOTE — Patient Instructions (Signed)
Decrease entocort to 1 capsule daily   Use imodium as needed for occasional diarrhea  Office visit with [place] in 3 months

## 2010-12-20 NOTE — Progress Notes (Signed)
Primary Care Physician:  No primary provider on file. Primary Gastroenterologist:  Dr. Jena Gauss  Pre-Procedure History & Physical: HPI:  Howard [name] is a 77 y.o. male here for followup of microscopic colitis. He has been on Entocort since his last visit he states it is much better he may have a bout of diarrhea and rare incontinence once every 2 weeks or so the remainder that time he does well with  1-2 formed bowel movements daily. He's been under a lot of stress recently with his wife suffering a fractured shoulder and is now in the nursing home.  Past Medical History  Diagnosis Date  . Diabetes mellitus     type II  . Hypertension   . Pneumonia 2007  . Hyperlipidemia   . Adenocarcinoma 2003    COLON  . PVD (peripheral vascular disease)     ABI 0.78 RIGHT AND 0.80 LEFT 4/11  . Osteoarthritis     OF KNEES  . Kidney stone   . Schatzki's ring 08/05/10    egd with Dr. Jena Gauss  . Hiatal hernia 08/05/10    egd with Dr. Jena Gauss  . Esophageal dysmotility   . Microscopic colitis     Past Surgical History  Procedure Date  . Hemicolectomy 2003  . Appendectomy   . Colonoscopy 04/2001  . Esophagogastroduodenoscopy 04/2001  . Esophagogastroduodenoscopy 08/05/10    schatzki ring, hiatal hernia    Prior to Admission medications   Medication Sig Start Date End Date Taking? Authorizing Provider  albuterol (PROAIR HFA) 108 (90 BASE) MCG/ACT inhaler Inhale 2 puffs into the lungs every 6 (six) hours as needed.     Yes Historical Provider, MD  aspirin 325 MG tablet Take 325 mg by mouth daily.     Yes Historical Provider, MD  budesonide (ENTOCORT EC) 3 MG 24 hr capsule Take 6 mg by mouth every morning.     Yes Historical Provider, MD  Cinnamon 500 MG TABS Take by mouth 2 (two) times daily.     Yes Historical Provider, MD  furosemide (LASIX) 20 MG tablet Take 20 mg by mouth 2 (two) times daily.     Yes Historical Provider, MD  lisinopril (PRINIVIL,ZESTRIL) 20 MG tablet Take 1 tablet (20 mg total) by  mouth daily. 08/26/10 08/26/11 Yes Valera Castle, MD  LORazepam (ATIVAN) 0.5 MG tablet Take 0.5 mg by mouth as needed.     Yes Historical Provider, MD  metFORMIN (GLUCOPHAGE) 500 MG tablet Take 500 mg by mouth 2 (two) times daily.     Yes Historical Provider, MD  metoprolol tartrate (LOPRESSOR) 25 MG tablet Take 25 mg by mouth daily.    Yes Historical Provider, MD  Omega-3 Fatty Acids (FISH OIL) 1000 MG CAPS Take 1 capsule by mouth daily.     Yes Historical Provider, MD  pantoprazole (PROTONIX) 40 MG tablet  10/04/10  Yes Historical Provider, MD  potassium chloride SA (K-DUR,KLOR-CON) 20 MEQ tablet Take 20 mEq by mouth daily.     Yes Historical Provider, MD  pyridostigmine (MESTINON) 60 MG tablet Take 60 mg by mouth. AS DIRECTED    Yes Historical Provider, MD  simvastatin (ZOCOR) 40 MG tablet Take 40 mg by mouth.     Yes Historical Provider, MD  tiotropium (SPIRIVA) 18 MCG inhalation capsule Place 18 mcg into inhaler and inhale daily.     Yes Historical Provider, MD  loperamide (IMODIUM) 2 MG capsule Take 2 mg by mouth as needed.  10/29/10   Historical Provider, MD  metoprolol (LOPRESSOR) 50 MG tablet Take 50 mg by mouth 2 (two) times daily.  10/30/10   Historical Provider, MD    Allergies as of 12/20/2010  . (No Known Allergies)    No family history on file.  History   Social History  . Marital Status: Married    Spouse Name: N/A    Number of Children: N/A  . Years of Education: N/A   Occupational History  . Not on file.   Social History Main Topics  . Smoking status: Never Smoker   . Smokeless tobacco: Never Used  . Alcohol Use: No  . Drug Use: No  . Sexually Active: Not on file   Other Topics Concern  . Not on file   Social History Narrative  . No narrative on file    Review of Systems: See HPI, otherwise negative ROS  Physical Exam: BP 155/65  Pulse 61  Temp(Src) 98.2 F (36.8 C) (Temporal)  Ht 5\' 8"  (1.727 m)  Wt 194 lb (87.998 kg)  BMI 29.50 kg/m2 General:   Alert,   Well-developed, well-nourished, pleasant and cooperative in NAD Head:  Normocephalic and atraumatic. Eyes:  Sclera clear, no icterus.   Conjunctiva pink. Ears:  Normal auditory acuity. Nose:  No deformity, discharge,  or lesions. Mouth:  No deformity or lesions, dentition normal. Neck:  Supple; no masses or thyromegaly. Lungs:  Clear throughout to auscultation.   No wheezes, crackles, or rhonchi. No acute distress. Heart:  Regular rate and rhythm; no murmurs, clicks, rubs,  or gallops. Abdomen:  Soft, nontender and nondistended. No masses, hepatosplenomegaly or hernias noted. Normal bowel sounds, without guarding, and without rebound.   Msk:  Symmetrical without gross deformities. Normal posture. Pulses:  Normal pulses noted. Extremities:  Without clubbing or edema. Neurologic:  Alert and  oriented x4;  grossly normal neurologically. Skin:  Intact without significant lesions or rashes. Cervical Nodes:  No significant cervical adenopathy. Psych:  Alert and cooperative. Normal mood and affect.  Impression/Plan:

## 2011-01-21 ENCOUNTER — Encounter: Payer: Self-pay | Admitting: Internal Medicine

## 2011-03-03 ENCOUNTER — Ambulatory Visit: Payer: Medicare Other | Admitting: Internal Medicine

## 2011-03-07 ENCOUNTER — Encounter: Payer: Self-pay | Admitting: Internal Medicine

## 2011-03-07 ENCOUNTER — Ambulatory Visit (INDEPENDENT_AMBULATORY_CARE_PROVIDER_SITE_OTHER): Payer: Medicare Other | Admitting: Internal Medicine

## 2011-03-07 VITALS — BP 170/80 | HR 64 | Temp 97.5°F | Ht 69.0 in | Wt 191.4 lb

## 2011-03-07 DIAGNOSIS — R197 Diarrhea, unspecified: Secondary | ICD-10-CM

## 2011-03-07 NOTE — Assessment & Plan Note (Signed)
History of microscopic colitis. He's had a good treatment course of entocort. Symptoms now more consistent with an element of irritable bowel syndrome. No alarm symptoms.  Recommendations: Discontinue Entocort. Begin off label use of Questran 2 g orally daily not to be taken within 2 hours of any other medications. I warned about potential for constipation. Discussed the off label use of this medication.  He is to let me know in 2 weeks via telephone how he is doing with his bowels and we will go from there. Plan for a followup appointment in 3 months

## 2011-03-07 NOTE — Patient Instructions (Signed)
Questran 2 grams daily - off label for loose stools  Stop entocort  Use Imodium as needed with breakthrough diarrhea  Office visit 3 months  Follow-up colonoscopy 2016

## 2011-03-07 NOTE — Progress Notes (Signed)
Primary Care Physician:  [name], MD Primary Gastroenterologist:  Dr. Jena Gauss  Pre-Procedure History & Physical: HPI:  Howard [name] is a 78 y.o. male here for followup of microscopic colitis. He's taking entocort 3 mg daily. He does about one or 2 days without a bowel movement and then may have a bout of subsequent incontinence and may have to 3 loose bowel movements. No blood per rectum. No dysphagia reported. Due for surveillance colonoscopy 2016.  Past Medical History  Diagnosis Date  . Diabetes mellitus     type II  . Hypertension   . Pneumonia 2007  . Hyperlipidemia   . Adenocarcinoma 2003    COLON  . PVD (peripheral vascular disease)     ABI 0.78 RIGHT AND 0.80 LEFT 4/11  . Osteoarthritis     OF KNEES  . Kidney stone   . Schatzki's ring 08/05/10    egd with Dr. Jena Gauss  . Hiatal hernia 08/05/10    egd with Dr. Jena Gauss  . Esophageal dysmotility   . Microscopic colitis   . Internal hemorrhoids   . Diverticula of colon     Past Surgical History  Procedure Date  . Hemicolectomy 2003  . Appendectomy   . Colonoscopy 04/2001  . Esophagogastroduodenoscopy 04/2001  . Esophagogastroduodenoscopy 08/05/10    schatzki ring, hiatal hernia  . Colonoscopy 09/04/09    diverticula, hemorrhoids    Prior to Admission medications   Medication Sig Start Date End Date Taking? Authorizing Provider  albuterol (PROAIR HFA) 108 (90 BASE) MCG/ACT inhaler Inhale 2 puffs into the lungs every 6 (six) hours as needed.     Yes Historical Provider, MD  aspirin 325 MG tablet Take 325 mg by mouth daily.     Yes Historical Provider, MD  budesonide (ENTOCORT EC) 3 MG 24 hr capsule Take 3 mg by mouth every morning.    Yes Historical Provider, MD  Cinnamon 500 MG TABS Take by mouth 2 (two) times daily.     Yes Historical Provider, MD  lisinopril (PRINIVIL,ZESTRIL) 20 MG tablet Take 10 mg by mouth daily. 08/26/10 08/26/11 Yes Valera Castle, MD  loperamide (IMODIUM) 2 MG capsule Take 2 mg by mouth as needed.   10/29/10  Yes Historical Provider, MD  LORazepam (ATIVAN) 0.5 MG tablet Take 0.5 mg by mouth as needed.     Yes Historical Provider, MD  metFORMIN (GLUCOPHAGE) 500 MG tablet Take 500 mg by mouth 2 (two) times daily.     Yes Historical Provider, MD  metoprolol tartrate (LOPRESSOR) 25 MG tablet Take 25 mg by mouth daily.    Yes Historical Provider, MD  Omega-3 Fatty Acids (FISH OIL) 1000 MG CAPS Take 1 capsule by mouth daily.     Yes Historical Provider, MD  potassium chloride SA (K-DUR,KLOR-CON) 20 MEQ tablet Take 20 mEq by mouth daily.     Yes Historical Provider, MD  pyridostigmine (MESTINON) 60 MG tablet Take 60 mg by mouth. AS DIRECTED    Yes Historical Provider, MD  simvastatin (ZOCOR) 40 MG tablet Take 40 mg by mouth.     Yes Historical Provider, MD  tiotropium (SPIRIVA) 18 MCG inhalation capsule Place 18 mcg into inhaler and inhale daily.     Yes Historical Provider, MD  furosemide (LASIX) 20 MG tablet Take 20 mg by mouth 2 (two) times daily.      Historical Provider, MD  pantoprazole (PROTONIX) 40 MG tablet  10/04/10   Historical Provider, MD    Allergies as of 03/07/2011  . (  No Known Allergies)    No family history on file.  History   Social History  . Marital Status: Married    Spouse Name: N/A    Number of Children: N/A  . Years of Education: N/A   Occupational History  . Not on file.   Social History Main Topics  . Smoking status: Former Games developer  . Smokeless tobacco: Never Used   Comment: Quit x 20 years  . Alcohol Use: No  . Drug Use: No  . Sexually Active: Not on file   Other Topics Concern  . Not on file   Social History Narrative  . No narrative on file    Review of Systems: See HPI, otherwise negative ROS  Physical Exam: BP 170/80  Pulse 64  Temp(Src) 97.5 F (36.4 C) (Tympanic)  Ht 5\' 9"  (1.753 m)  Wt 191 lb 6.4 oz (86.818 kg)  BMI 28.26 kg/m2 General:   Elderly Alert,  Well-developed, well-nourished, pleasant and cooperative in NAD Skin:  Intact  without significant lesions or rashes. Eyes:  Sclera clear, no icterus.   Conjunctiva pink. Ears:  Normal auditory acuity. Nose:  No deformity, discharge,  or lesions. Mouth:  No deformity or lesions. Neck:  Supple; no masses or thyromegaly. No significant cervical adenopathy. Lungs:  Clear throughout to auscultation.   No wheezes, crackles, or rhonchi. No acute distress. Heart:  Regular rate and rhythm; no murmurs, clicks, rubs,  or gallops. Abdomen: Non-distended, normal bowel sounds.  Soft and nontender without appreciable mass or hepatosplenomegaly.  Pulses:  Normal pulses noted. Extremities:  Without clubbing or edema.  Impression/Plan:

## 2011-03-11 ENCOUNTER — Ambulatory Visit (HOSPITAL_COMMUNITY)
Admission: RE | Admit: 2011-03-11 | Discharge: 2011-03-11 | Disposition: A | Discharge status: Home / Self Care | Payer: Medicare Other | Source: Ambulatory Visit | Attending: Family Medicine | Admitting: Family Medicine

## 2011-03-11 ENCOUNTER — Other Ambulatory Visit (HOSPITAL_COMMUNITY): Payer: Self-pay | Admitting: Family Medicine

## 2011-03-11 DIAGNOSIS — R079 Chest pain, unspecified: Secondary | ICD-10-CM | POA: Insufficient documentation

## 2011-03-11 DIAGNOSIS — R05 Cough: Secondary | ICD-10-CM

## 2011-03-11 DIAGNOSIS — R0609 Other forms of dyspnea: Secondary | ICD-10-CM | POA: Insufficient documentation

## 2011-03-11 DIAGNOSIS — R0600 Dyspnea, unspecified: Secondary | ICD-10-CM

## 2011-03-11 DIAGNOSIS — R0989 Other specified symptoms and signs involving the circulatory and respiratory systems: Secondary | ICD-10-CM | POA: Insufficient documentation

## 2011-03-11 DIAGNOSIS — R059 Cough, unspecified: Secondary | ICD-10-CM | POA: Insufficient documentation

## 2011-03-11 DIAGNOSIS — E119 Type 2 diabetes mellitus without complications: Secondary | ICD-10-CM | POA: Insufficient documentation

## 2011-03-11 DIAGNOSIS — R0789 Other chest pain: Secondary | ICD-10-CM

## 2011-03-11 DIAGNOSIS — I10 Essential (primary) hypertension: Secondary | ICD-10-CM | POA: Insufficient documentation

## 2011-03-12 ENCOUNTER — Other Ambulatory Visit (HOSPITAL_COMMUNITY): Payer: Self-pay | Admitting: Family Medicine

## 2011-03-12 ENCOUNTER — Ambulatory Visit (HOSPITAL_COMMUNITY)
Admission: RE | Admit: 2011-03-12 | Discharge: 2011-03-12 | Disposition: A | Discharge status: Home / Self Care | Payer: Medicare Other | Source: Ambulatory Visit | Attending: Family Medicine | Admitting: Family Medicine

## 2011-03-12 DIAGNOSIS — E119 Type 2 diabetes mellitus without complications: Secondary | ICD-10-CM | POA: Insufficient documentation

## 2011-03-12 DIAGNOSIS — J90 Pleural effusion, not elsewhere classified: Secondary | ICD-10-CM | POA: Insufficient documentation

## 2011-03-12 DIAGNOSIS — R0789 Other chest pain: Secondary | ICD-10-CM | POA: Insufficient documentation

## 2011-03-12 DIAGNOSIS — I10 Essential (primary) hypertension: Secondary | ICD-10-CM | POA: Insufficient documentation

## 2011-03-12 MED ORDER — IOHEXOL 300 MG/ML  SOLN
80.0000 mL | Freq: Once | INTRAMUSCULAR | Status: AC | PRN
  Administered 2011-03-12: 80 mL via INTRAVENOUS

## 2011-03-15 ENCOUNTER — Emergency Department (HOSPITAL_COMMUNITY): Payer: Medicare Other

## 2011-03-15 ENCOUNTER — Encounter (HOSPITAL_COMMUNITY): Payer: Self-pay

## 2011-03-15 ENCOUNTER — Other Ambulatory Visit: Payer: Self-pay

## 2011-03-15 ENCOUNTER — Emergency Department (HOSPITAL_COMMUNITY)
Admission: EM | Admit: 2011-03-15 | Discharge: 2011-03-15 | Disposition: A | Discharge status: Home / Self Care | Payer: Medicare Other | Attending: Emergency Medicine | Admitting: Emergency Medicine

## 2011-03-15 DIAGNOSIS — J90 Pleural effusion, not elsewhere classified: Secondary | ICD-10-CM

## 2011-03-15 DIAGNOSIS — I739 Peripheral vascular disease, unspecified: Secondary | ICD-10-CM | POA: Insufficient documentation

## 2011-03-15 DIAGNOSIS — M1710 Unilateral primary osteoarthritis, unspecified knee: Secondary | ICD-10-CM | POA: Insufficient documentation

## 2011-03-15 DIAGNOSIS — M25473 Effusion, unspecified ankle: Secondary | ICD-10-CM | POA: Insufficient documentation

## 2011-03-15 DIAGNOSIS — E119 Type 2 diabetes mellitus without complications: Secondary | ICD-10-CM | POA: Insufficient documentation

## 2011-03-15 DIAGNOSIS — K222 Esophageal obstruction: Secondary | ICD-10-CM | POA: Insufficient documentation

## 2011-03-15 DIAGNOSIS — M25476 Effusion, unspecified foot: Secondary | ICD-10-CM | POA: Insufficient documentation

## 2011-03-15 DIAGNOSIS — E785 Hyperlipidemia, unspecified: Secondary | ICD-10-CM | POA: Insufficient documentation

## 2011-03-15 DIAGNOSIS — Z87442 Personal history of urinary calculi: Secondary | ICD-10-CM | POA: Insufficient documentation

## 2011-03-15 DIAGNOSIS — R609 Edema, unspecified: Secondary | ICD-10-CM | POA: Insufficient documentation

## 2011-03-15 DIAGNOSIS — K449 Diaphragmatic hernia without obstruction or gangrene: Secondary | ICD-10-CM | POA: Insufficient documentation

## 2011-03-15 DIAGNOSIS — Z8701 Personal history of pneumonia (recurrent): Secondary | ICD-10-CM | POA: Insufficient documentation

## 2011-03-15 DIAGNOSIS — R0602 Shortness of breath: Secondary | ICD-10-CM | POA: Insufficient documentation

## 2011-03-15 DIAGNOSIS — Z85038 Personal history of other malignant neoplasm of large intestine: Secondary | ICD-10-CM | POA: Insufficient documentation

## 2011-03-15 DIAGNOSIS — I10 Essential (primary) hypertension: Secondary | ICD-10-CM | POA: Insufficient documentation

## 2011-03-15 LAB — COMPREHENSIVE METABOLIC PANEL
BUN: 15 mg/dL (ref 6–23)
CO2: 30 mEq/L (ref 19–32)
Chloride: 102 mEq/L (ref 96–112)
Creatinine, Ser: 0.8 mg/dL (ref 0.50–1.35)
GFR calc non Af Amer: 83 mL/min — ABNORMAL LOW (ref >90)
Total Bilirubin: 0.4 mg/dL (ref 0.3–1.2)

## 2011-03-15 LAB — CBC
HCT: 30.6 % — ABNORMAL LOW (ref 39.0–52.0)
MCV: 100.3 fL — ABNORMAL HIGH (ref 78.0–100.0)
RBC: 3.05 MIL/uL — ABNORMAL LOW (ref 4.22–5.81)
RDW: 15 % (ref 11.5–15.5)
WBC: 7.8 10*3/uL (ref 4.0–10.5)

## 2011-03-15 LAB — TROPONIN I: Troponin I: 0.36 ng/mL (ref <0.30)

## 2011-03-15 LAB — PRO B NATRIURETIC PEPTIDE: Pro B Natriuretic peptide (BNP): 6539 pg/mL — ABNORMAL HIGH (ref 0–450)

## 2011-03-15 MED ORDER — IOHEXOL 350 MG/ML SOLN
100.0000 mL | Freq: Once | INTRAVENOUS | Status: AC | PRN
  Administered 2011-03-15: 100 mL via INTRAVENOUS

## 2011-03-15 NOTE — ED Provider Notes (Signed)
History     CSN: [id number]  Arrival date & time 03/15/11  1241   First MD Initiated Contact with Patient 03/15/11 1356      Chief Complaint  Patient presents with  . Shortness of Breath    (Consider location/radiation/quality/duration/timing/severity/associated sxs/prior treatment) Patient is a 78 y.o. male presenting with shortness of breath. The history is provided by the patient and a relative.  Shortness of Breath  The current episode started 3 to 5 days ago. The problem has been unchanged. The problem is moderate. Relieved by: sitting upright. The symptoms are aggravated by a supine position. Associated symptoms include shortness of breath. Pertinent negatives include no chest pain, no chest pressure, no fever, no cough and no wheezing.   Patient does not use oxygen at home. Patient does has a history of some mild COPD.  Past Medical History  Diagnosis Date  . Diabetes mellitus     type II  . Hypertension   . Pneumonia 2007  . Hyperlipidemia   . Adenocarcinoma 2003    COLON  . PVD (peripheral vascular disease)     ABI 0.78 RIGHT AND 0.80 LEFT 4/11  . Osteoarthritis     OF KNEES  . Kidney stone   . Schatzki's ring 08/05/10    egd with Dr. Jena Gauss  . Hiatal hernia 08/05/10    egd with Dr. Jena Gauss  . Esophageal dysmotility   . Microscopic colitis   . Internal hemorrhoids   . Diverticula of colon     Past Surgical History  Procedure Date  . Hemicolectomy 2003  . Appendectomy   . Colonoscopy 04/2001  . Esophagogastroduodenoscopy 04/2001  . Esophagogastroduodenoscopy 08/05/10    schatzki ring, hiatal hernia  . Colonoscopy 09/04/09    diverticula, hemorrhoids    No family history on file.  History  Substance Use Topics  . Smoking status: Former Games developer  . Smokeless tobacco: Never Used   Comment: Quit x 20 years  . Alcohol Use: No      Review of Systems  Constitutional: Negative for fever.  HENT: Negative for congestion, neck pain and neck stiffness.   Eyes:  Negative for redness and visual disturbance.  Respiratory: Positive for shortness of breath. Negative for cough and wheezing.   Cardiovascular: Positive for leg swelling. Negative for chest pain.  Gastrointestinal: Negative for nausea, vomiting, abdominal pain and diarrhea.  Genitourinary: Negative for dysuria.  Musculoskeletal: Negative for back pain.  Neurological: Negative for headaches.  Hematological: Does not bruise/bleed easily.    Allergies  Review of patient's allergies indicates no known allergies.  Home Medications   Current Outpatient Rx  Name Route Sig Dispense Refill  . ALBUTEROL SULFATE HFA 108 (90 BASE) MCG/ACT IN AERS Inhalation Inhale 2 puffs into the lungs every 6 (six) hours as needed.      . ASPIRIN 325 MG PO TABS Oral Take 325 mg by mouth daily.      . BUDESONIDE ER 3 MG PO CP24 Oral Take 3 mg by mouth every morning.     Marland Kitchen CINNAMON 500 MG PO TABS Oral Take by mouth 2 (two) times daily.      . FUROSEMIDE 20 MG PO TABS Oral Take 20 mg by mouth 2 (two) times daily.      Marland Kitchen LISINOPRIL 20 MG PO TABS Oral Take 10 mg by mouth daily.    Marland Kitchen LOPERAMIDE HCL 2 MG PO CAPS Oral Take 2 mg by mouth as needed.     Marland Kitchen LORAZEPAM 0.5 MG  PO TABS Oral Take 0.5 mg by mouth as needed.      Marland Kitchen METFORMIN HCL 500 MG PO TABS Oral Take 500 mg by mouth 2 (two) times daily.      Marland Kitchen METOPROLOL TARTRATE 25 MG PO TABS Oral Take 25 mg by mouth daily.     Marland Kitchen FISH OIL 1000 MG PO CAPS Oral Take 1 capsule by mouth daily.      Marland Kitchen POTASSIUM CHLORIDE CRYS ER 20 MEQ PO TBCR Oral Take 20 mEq by mouth daily.      Marland Kitchen PYRIDOSTIGMINE BROMIDE 60 MG PO TABS Oral Take 60 mg by mouth. AS DIRECTED     . SIMVASTATIN 40 MG PO TABS Oral Take 40 mg by mouth.      Marland Kitchen PANTOPRAZOLE SODIUM 40 MG PO TBEC        BP 180/126  Pulse 60  Temp(Src) 98.7 F (37.1 C) (Oral)  Resp 22  Ht 5\' 8"  (1.727 m)  Wt 191 lb (86.637 kg)  BMI 29.04 kg/m2  SpO2 96%  Physical Exam  Nursing note and vitals reviewed. Constitutional: He is  oriented to person, place, and time. He appears well-developed and well-nourished.  HENT:  Head: Normocephalic and atraumatic.  Mouth/Throat: Oropharynx is clear and moist.  Eyes: Conjunctivae and EOM are normal. Pupils are equal, round, and reactive to light.  Neck: Normal range of motion. Neck supple.  Cardiovascular: Normal rate, regular rhythm and normal heart sounds.   No murmur heard. Pulmonary/Chest: Effort normal and breath sounds normal.  Abdominal: Soft. Bowel sounds are normal. There is no tenderness.  Musculoskeletal: Normal range of motion. He exhibits edema. He exhibits no tenderness.       Very slight bilateral ankle swelling.  Neurological: He is alert and oriented to person, place, and time. No cranial nerve deficit. He exhibits normal muscle tone. Coordination normal.  Skin: Skin is warm. No rash noted.    ED Course  Procedures (including critical care time)  Labs Reviewed  CBC - Abnormal; Notable for the following:    RBC 3.05 (*)    Hemoglobin 10.1 (*)    HCT 30.6 (*)    MCV 100.3 (*)    All other components within normal limits  COMPREHENSIVE METABOLIC PANEL - Abnormal; Notable for the following:    Glucose, Bld 117 (*)    Albumin 3.2 (*)    GFR calc non Af Amer 83 (*)    All other components within normal limits  PRO B NATRIURETIC PEPTIDE - Abnormal; Notable for the following:    Pro B Natriuretic peptide (BNP) 6539.0 (*)    All other components within normal limits  TROPONIN I - Abnormal; Notable for the following:    Troponin I 0.36 (*)    All other components within normal limits  TROPONIN I   Ct Angio Chest W/cm &/or Wo Cm  03/15/2011  *RADIOLOGY REPORT*  Clinical Data: Shortness of breath.  CT ANGIOGRAPHY CHEST  Technique:  Multidetector CT imaging of the chest using the standard protocol during bolus administration of intravenous contrast. Multiplanar reconstructed images including MIPs were obtained and reviewed to evaluate the vascular anatomy.   Contrast: OMNIPAQUE IOHEXOL 350 MG/ML IV SOLN  Comparison: Chest CT 3 days ago.  Chest x-ray earlier today.  Findings: Persistent bilateral pleural effusions, right slightly greater than left.  No evidence for pulmonary embolus.  Mild right greater than left lower lung atelectasis. Slight increased consolidation right base could represent early pneumonia.  Mild aneurysmal  dilatation of the ascending aorta and main pulmonary artery is stable.  Advanced coronary artery calcification is noted. Small nonpathologic mediastinal nodes.  Gynecomastia.  Small subcentimeter hepatic lesions likely cysts are redemonstrated. Calcified lower thoracic disc projects into the spinal canal.  IMPRESSION: No evidence for pulmonary embolic disease.  Slight increased consolidation right base could represent early pneumonia. Bilateral pleural effusions persist. Advanced coronary artery calcification.  Slight ascending aortic aneurysm stable.  Slight dilated proximal pulmonary artery consistent with pulmonary arterial hypertension.  Original Report Authenticated By: Elsie Stain, M.D.   Dg Chest Portable 1 View  03/15/2011  *RADIOLOGY REPORT*  Clinical Data: Persistent shortness of breath  PORTABLE CHEST - 1 VIEW  Comparison: CT chest dated 03/12/2011  Findings: Mild left basilar atelectasis with suspected small left pleural effusion.  Lungs otherwise clear.  No pneumothorax.  Enlargement of the cardiac silhouette, corresponding to prominent epicardial fat when correlating with recent CT.  IMPRESSION: Mild left basilar atelectasis with suspected small left pleural effusion.  Original Report Authenticated By: Charline Bills, M.D.   Results for orders placed during the hospital encounter of 03/15/11  CBC      Component Value Range   WBC 7.8  4.0 - 10.5 (K/uL)   RBC 3.05 (*) 4.22 - 5.81 (MIL/uL)   Hemoglobin 10.1 (*) 13.0 - 17.0 (g/dL)   HCT 19.1 (*) 47.8 - 52.0 (%)   MCV 100.3 (*) 78.0 - 100.0 (fL)   MCH 33.1  26.0 -  34.0 (pg)   MCHC 33.0  30.0 - 36.0 (g/dL)   RDW 29.5  62.1 - 30.8 (%)   Platelets 197  150 - 400 (K/uL)  COMPREHENSIVE METABOLIC PANEL      Component Value Range   Sodium 139  135 - 145 (mEq/L)   Potassium 4.1  3.5 - 5.1 (mEq/L)   Chloride 102  96 - 112 (mEq/L)   CO2 30  19 - 32 (mEq/L)   Glucose, Bld 117 (*) 70 - 99 (mg/dL)   BUN 15  6 - 23 (mg/dL)   Creatinine, Ser 6.57  0.50 - 1.35 (mg/dL)   Calcium 84.6  8.4 - 10.5 (mg/dL)   Total Protein 6.4  6.0 - 8.3 (g/dL)   Albumin 3.2 (*) 3.5 - 5.2 (g/dL)   AST 15  0 - 37 (U/L)   ALT 12  0 - 53 (U/L)   Alkaline Phosphatase 49  39 - 117 (U/L)   Total Bilirubin 0.4  0.3 - 1.2 (mg/dL)   GFR calc non Af Amer 83 (*) >90 (mL/min)   GFR calc Af Amer >90  >90 (mL/min)  PRO B NATRIURETIC PEPTIDE      Component Value Range   Pro B Natriuretic peptide (BNP) 6539.0 (*) 0 - 450 (pg/mL)  TROPONIN I      Component Value Range   Troponin I 0.36 (*) <0.30 (ng/mL)  TROPONIN I      Component Value Range   Troponin I <0.30  <0.30 (ng/mL)    Date: 03/15/2011  Rate: 64  Rhythm: normal sinus rhythm  QRS Axis: normal  Intervals: normal  ST/T Wave abnormalities: nonspecific ST changes  Conduction Disutrbances:none  Narrative Interpretation:   Old EKG Reviewed: none available    1. Pleural effusion       MDM   Patient presents with the complaint of shortness of breath patient has been seen by Dr. Megan Mans several times this week at least twice in the office once on Wednesday and Friday and maybe  even on Tuesday has had chest x-rays done twice with findings of maybe a small pleural effusion. Patient is found that he needs to sit upright or partially upright to sleep at night otherwise he gets. Short of breath has not noted any exertional shortness of breath he has not been walking around much. Dr. Megan Mans started Lasix on him 20 mg twice a day and has arranged followup with his cardiologist on Tuesday Dr. Daleen Squibb and the plan to do an echocardiogram.  Patient denies any chest pain.  Today has been extensive ossific findings mostly related to the fact that CT angiogram depicts the bilateral pleural effusions no significant change in them no evidence of PE troponin initial one was slightly elevated repeat was normal denies suspected an acute cardiac event EKG without any acute changes in the second troponin was normal not consistent with an acute event. No significant lab abnormalities. Patient on room air here in the emergency department his O2 sat is in the mid 90s.  Patient is alert in no acute distress and nontoxic patient should be a little home and continue outpatient followup as per Dr. Megan Mans is planned with Dr. Daleen Squibb.        Shelda Jakes, MD 03/15/11 2129

## 2011-03-15 NOTE — ED Notes (Signed)
Discharge time 2150. Computerized doc time different due to pt being moved to [facility].

## 2011-03-15 NOTE — ED Notes (Signed)
Pt presents with SOB since Monday. Pt has been to PMD Tuesday, Wednesday, and Friday. Pt was given and Rx for cough syrup and Lasix. Pt also had appointment for CXR on Tuesday 1/22.

## 2011-03-15 NOTE — ED Notes (Signed)
CRITICAL VALUE ALERT  Critical value received:  Troponin 0.36  Date of notification:  03/15/11  Time of notification:  16:17  Critical value read back: yes  Nurse who received alert:  L. Raquel James, RN  MD notified (1st page):  Dr. Deretha Emory  Time of first page:  16:17  MD notified (2nd page):  Time of second page:  Responding MD:  Dr. Deretha Emory  Time MD responded:  16:18

## 2011-03-15 NOTE — ED Notes (Signed)
Patient ambulatory to restroom, and back to bed. No needs or complaints voiced at this time.

## 2011-03-15 NOTE — Discharge Instructions (Signed)
Pleural Effusion The lining covering your lungs and the inside of your chest is called the pleura. Usually, the space between the 2 pleura contains no air and only a thin layer of fluid. A pleural effusion is an abnormal buildup of fluid in the pleural space. Fluid gathers when there is increased pressure in the lung vessels. This forces fluids out of the lungs and into the pleural space. Vessels may also leak fluids when there are infections, such as pneumonia, or other causes of soreness and redness (inflammation). Fluids leak into the lungs when protein in the blood is low or when certain vessels (lymphatics) are blocked. Finding a pleural effusion is important because it is usually caused by another disease. In order to treat a pleural effusion, your caregiver needs to find its cause. If left untreated, a large amount of fluid can build up and cause collapse of the lung. CAUSES   Heart failure.   Infections (pneumonia, tuberculosis), pulmonary embolism, pulmonary infarction.   Cancer (primary lung and metastatic), asbestosis.   Liver failure (cirrhosis).   Nephrotic syndrome, peritoneal dialysis, kidney problems (uremia).   Collagen vascular disease (systemic lupus erythematosis, rheumatoid arthritis).   Injury (trauma) to the chest or rupture of the digestive tube (esophagus).   Material in the chest or pleural space (hemothorax, chylothorax).   Pancreatitis.   Surgery.   Drug reactions.  SYMPTOMS  A pleural effusion can decrease the amount of space available for breathing and make you short of breath. The fluid can become infected, which may cause pain and fever. Often, the pain is worse when taking a deep breath. The underlying disease (heart failure, pneumonia, blood clot, tuberculosis, cancer) may also cause symptoms. DIAGNOSIS   Your caregiver can usually tell what is wrong by talking to you (taking a history), doing an exam, and taking a routine X-ray. If the X-ray shows  fluid in your chest, often fluid is removed from your chest with a needle for testing (diagnostic thoracentesis).   Sometimes, more specialized X-rays may be needed.   Sometimes, a small piece of tissue is removed and examined by a specialist (biopsy).  TREATMENT  Treatment varies based on what caused the pleural effusion. Treatments include:  Removing as much fluid as possible using a needle (thoracentesis) to improve the cough and shortness of breath. This is a simple procedure which can be done at bedside. The risks are bleeding, infection, collapse of a lung, or low blood pressure.   Placing a tube in the chest to drain the effusion (tube thoracostomy). This is often used when there is an infection in the fluid. This is a simple procedure which can often be done at bedside or in a clinic. The procedure may be painful. The risks are the same as using a needle to drain the fluid. The chest tube usually remains for a few days and is connected to suction to improve fluid drainage. The tube, after placement, usually does not cause much discomfort.   Surgical removal of fibrous debris in and around the pleural space (decortication). This may be done with a flexible telescope (thoracoscope) through a small or large cut (incision). This is helpful for patients who have fibrosis or scar tissue that prevents complete lung expansion. The risks are infection, blood loss, and side effects from general anesthesia.   Sometimes, a procedure called pleurodesis is done. A chest tube is placed and the fluid is drained. Next, an agent (tetracycline, talc powder) is added to the pleural space. This  causes the lung and chest wall to stick together (adhesion). This leaves no potential space for fluid to build up. The risks include infection, blood loss, and side effects from general anesthesia.   If the effusion is caused by infection, it may be treated with antibiotics and improve without draining.  HOME CARE  INSTRUCTIONS   Take any medicines exactly as prescribed.   Follow up with your caregiver as directed.   Monitor your exercise capacity (the amount of walking you can do before you get short of breath).   Do not smoke. Ask your caregiver for help quitting.  SEEK MEDICAL CARE IF:   Your exercise capacity seems to get worse or does not improve with time.   You do not recover from your illness.  SEEK IMMEDIATE MEDICAL CARE IF:   Shortness of breath or chest pain develops or gets worse.   You have an oral temperature above 102 F (38.9 C), not controlled by medicine.   You develop a new cough, especially if the mucus (phlegm) is discolored.  MAKE SURE YOU:   Understand these instructions.   Will watch your condition.   Will get help right away if you are not doing well or get worse.  Document Released: 02/10/2005 Document Revised: 10/23/2010 Document Reviewed: 10/02/2006 [facility] Patient Information 2012 [place], [place].   Will be best if you can sleep propped up. Followup with Dr. Megan Mans as scheduled and followup with cardiology Dr. Daleen Squibb for the echocardiogram is scheduled. Return for new or worse symptoms. Continue take your Lasix.

## 2011-03-18 ENCOUNTER — Ambulatory Visit (HOSPITAL_COMMUNITY)
Admission: RE | Admit: 2011-03-18 | Discharge: 2011-03-18 | Disposition: A | Discharge status: Home / Self Care | Payer: Medicare Other | Source: Ambulatory Visit | Attending: Family Medicine | Admitting: Family Medicine

## 2011-03-18 DIAGNOSIS — R0609 Other forms of dyspnea: Secondary | ICD-10-CM | POA: Insufficient documentation

## 2011-03-18 DIAGNOSIS — I059 Rheumatic mitral valve disease, unspecified: Secondary | ICD-10-CM

## 2011-03-18 DIAGNOSIS — R0989 Other specified symptoms and signs involving the circulatory and respiratory systems: Secondary | ICD-10-CM | POA: Insufficient documentation

## 2011-03-18 NOTE — Progress Notes (Signed)
*  PRELIMINARY RESULTS* Echocardiogram 2D Echocardiogram has been performed.  Conrad Alsip 03/18/2011, 1:08 PM

## 2011-03-31 ENCOUNTER — Ambulatory Visit: Payer: Medicare Other | Admitting: Adult Health

## 2011-04-02 ENCOUNTER — Ambulatory Visit (INDEPENDENT_AMBULATORY_CARE_PROVIDER_SITE_OTHER): Payer: Medicare Other | Admitting: Cardiology

## 2011-04-02 ENCOUNTER — Encounter: Payer: Self-pay | Admitting: Cardiology

## 2011-04-02 VITALS — BP 157/66 | HR 57 | Resp 18 | Ht 68.0 in | Wt 188.0 lb

## 2011-04-02 DIAGNOSIS — I5032 Chronic diastolic (congestive) heart failure: Secondary | ICD-10-CM

## 2011-04-02 DIAGNOSIS — I509 Heart failure, unspecified: Secondary | ICD-10-CM

## 2011-04-02 DIAGNOSIS — I252 Old myocardial infarction: Secondary | ICD-10-CM | POA: Insufficient documentation

## 2011-04-02 NOTE — Patient Instructions (Addendum)
Your physician recommends that you continue on your current medications as directed. Please refer to the Current Medication list given to you today.  Your physician recommends that you schedule a follow-up appointment in: 3 months  

## 2011-04-02 NOTE — Assessment & Plan Note (Signed)
He had an acute episode of diastolic heart failure requiring him to go to the emergency room as outlined above. He had been off his diuretic. He is now back on the diuretic and doing remarkably well. I've advised him not to stop this again or have anyone else stop it. I will see him back again in 6 months.

## 2011-04-02 NOTE — Assessment & Plan Note (Signed)
This was the hospital and asymptomatic. He is on good secondary preventative therapy and is not currently having any ischemic symptoms. Continue with medical therapy. He most likely has a totally occluded right coronary artery or circumflex. I will see him back in 4-6 months or close followup.

## 2011-04-02 NOTE — Progress Notes (Addendum)
HPI [name] comes in today for followup after being seen in the emergency room on January 19 for orthopnea and PND. He was found to be in congestive heart failure and was diuresed in the emergency room. Chest CT demonstrated bilateral pleural effusions but no pulmonary embolus. He did have coronary calcification.  He is now on a fluid pill twice a day. He denies any orthopnea, PND, dyspnea on exertion or edema. I saw him last year for chronic diastolic heart failure. Ejection fraction by echocardiogram was 55% at that time.  An echocardiogram on March 19, 2011 shows a new area of scar tissue and thinning of the inferior Howard [name]. Systolic function is still fairly well preserved with an ejection fraction of 50%. He has moderate concentric hypertrophy. He is a mildly thickened aortic valve but with only mild stenosis. He is a moderately dilated left atrium. His mild mitral regurgitation.  Past Medical History  Diagnosis Date  . Diabetes mellitus     type II  . Hypertension   . Pneumonia 2007  . Hyperlipidemia   . Adenocarcinoma 2003    COLON  . PVD (peripheral vascular disease)     ABI 0.78 RIGHT AND 0.80 LEFT 4/11  . Osteoarthritis     OF KNEES  . Kidney stone   . Schatzki's ring 08/05/10    egd with Dr. Jena Gauss  . Hiatal hernia 08/05/10    egd with Dr. Jena Gauss  . Esophageal dysmotility   . Microscopic colitis   . Internal hemorrhoids   . Diverticula of colon     Current Outpatient Prescriptions  Medication Sig Dispense Refill  . albuterol (PROAIR HFA) 108 (90 BASE) MCG/ACT inhaler Inhale 2 puffs into the lungs every 6 (six) hours as needed.        Marland Kitchen aspirin 325 MG tablet Take 325 mg by mouth daily.        . Cinnamon 500 MG TABS Take by mouth 2 (two) times daily.        . furosemide (LASIX) 20 MG tablet Take 20 mg by mouth 2 (two) times daily.        Marland Kitchen lisinopril (PRINIVIL,ZESTRIL) 20 MG tablet Take 10 mg by mouth daily.      Marland Kitchen loperamide (IMODIUM) 2 MG capsule Take 2 mg by mouth as  needed.       Marland Kitchen LORazepam (ATIVAN) 0.5 MG tablet Take 0.5 mg by mouth as needed.        . metFORMIN (GLUCOPHAGE) 500 MG tablet Take 500 mg by mouth 2 (two) times daily.        . metoprolol tartrate (LOPRESSOR) 25 MG tablet Take 25 mg by mouth daily.       . Omega-3 Fatty Acids (FISH OIL) 1000 MG CAPS Take 1 capsule by mouth daily.        . pantoprazole (PROTONIX) 40 MG tablet       . potassium chloride SA (K-DUR,KLOR-CON) 20 MEQ tablet Take 20 mEq by mouth daily.        Marland Kitchen pyridostigmine (MESTINON) 60 MG tablet Take 60 mg by mouth. AS DIRECTED       . simvastatin (ZOCOR) 40 MG tablet Take 40 mg by mouth.          No Known Allergies  No family history on file.  History   Social History  . Marital Status: Married    Spouse Name: N/A    Number of Children: N/A  . Years of Education: N/A   Occupational  History  . Not on file.   Social History Main Topics  . Smoking status: Former Games developer  . Smokeless tobacco: Never Used   Comment: Quit x 20 years  . Alcohol Use: No  . Drug Use: No  . Sexually Active: Not on file   Other Topics Concern  . Not on file   Social History Narrative  . No narrative on file    ROS ALL NEGATIVE EXCEPT THOSE NOTED IN HPI  PE  General Appearance: well developed, well nourished in no acute distress, elderly HEENT: symmetrical face, PERRLA, good dentition  Neck: no JVD, thyromegaly, or adenopathy, trachea midline Chest: symmetric without deformity Cardiac: PMI non-displaced, RRR, normal S1, S2, no gallop or murmur Lung: clear to ausculation and percussion, no rales Vascular: all pulses full without bruits  Abdominal: nondistended, nontender, good bowel sounds, no HSM, no bruits Extremities: no cyanosis, clubbing or edema, no sign of DVT, no varicosities  Skin: normal color, no rashes Neuro: alert and oriented x 3, non-focal Pysch: normal affect  EKG  BMET    Component Value Date/Time   NA 139 03/15/2011 1530   K 4.1 03/15/2011 1530   CL  102 03/15/2011 1530   CO2 30 03/15/2011 1530   GLUCOSE 117* 03/15/2011 1530   BUN 15 03/15/2011 1530   CREATININE 0.80 03/15/2011 1530   CREATININE 0.99 09/02/2010 0950   CALCIUM 10.1 03/15/2011 1530   GFRNONAA 83* 03/15/2011 1530   GFRAA >90 03/15/2011 1530    Lipid Panel  No results found for this basename: chol, trig, hdl, cholhdl, vldl, ldlcalc    CBC    Component Value Date/Time   WBC 7.8 03/15/2011 1530   RBC 3.05* 03/15/2011 1530   HGB 10.1* 03/15/2011 1530   HCT 30.6* 03/15/2011 1530   PLT 197 03/15/2011 1530   MCV 100.3* 03/15/2011 1530   MCH 33.1 03/15/2011 1530   MCHC 33.0 03/15/2011 1530   RDW 15.0 03/15/2011 1530

## 2011-05-27 ENCOUNTER — Encounter: Payer: Self-pay | Admitting: Internal Medicine

## 2011-06-01 ENCOUNTER — Encounter (HOSPITAL_COMMUNITY): Payer: Self-pay

## 2011-06-01 ENCOUNTER — Other Ambulatory Visit: Payer: Self-pay

## 2011-06-01 ENCOUNTER — Inpatient Hospital Stay (HOSPITAL_COMMUNITY)
Admission: EM | Admit: 2011-06-01 | Discharge: 2011-06-06 | DRG: 292 | Disposition: A | Discharge status: Home / Self Care | Payer: Medicare Other | Attending: Pulmonary Disease | Admitting: Pulmonary Disease

## 2011-06-01 ENCOUNTER — Emergency Department (HOSPITAL_COMMUNITY): Payer: Medicare Other

## 2011-06-01 DIAGNOSIS — I5033 Acute on chronic diastolic (congestive) heart failure: Principal | ICD-10-CM | POA: Diagnosis present

## 2011-06-01 DIAGNOSIS — I10 Essential (primary) hypertension: Secondary | ICD-10-CM | POA: Diagnosis present

## 2011-06-01 DIAGNOSIS — R799 Abnormal finding of blood chemistry, unspecified: Secondary | ICD-10-CM | POA: Diagnosis present

## 2011-06-01 DIAGNOSIS — J42 Unspecified chronic bronchitis: Secondary | ICD-10-CM

## 2011-06-01 DIAGNOSIS — I252 Old myocardial infarction: Secondary | ICD-10-CM

## 2011-06-01 DIAGNOSIS — J209 Acute bronchitis, unspecified: Secondary | ICD-10-CM | POA: Diagnosis present

## 2011-06-01 DIAGNOSIS — E119 Type 2 diabetes mellitus without complications: Secondary | ICD-10-CM | POA: Diagnosis present

## 2011-06-01 DIAGNOSIS — D649 Anemia, unspecified: Secondary | ICD-10-CM | POA: Diagnosis present

## 2011-06-01 DIAGNOSIS — I429 Cardiomyopathy, unspecified: Secondary | ICD-10-CM | POA: Diagnosis present

## 2011-06-01 DIAGNOSIS — R0600 Dyspnea, unspecified: Secondary | ICD-10-CM

## 2011-06-01 DIAGNOSIS — I5032 Chronic diastolic (congestive) heart failure: Secondary | ICD-10-CM

## 2011-06-01 DIAGNOSIS — I2510 Atherosclerotic heart disease of native coronary artery without angina pectoris: Secondary | ICD-10-CM | POA: Diagnosis present

## 2011-06-01 DIAGNOSIS — M1710 Unilateral primary osteoarthritis, unspecified knee: Secondary | ICD-10-CM | POA: Diagnosis present

## 2011-06-01 DIAGNOSIS — I517 Cardiomegaly: Secondary | ICD-10-CM | POA: Diagnosis present

## 2011-06-01 DIAGNOSIS — I509 Heart failure, unspecified: Secondary | ICD-10-CM | POA: Diagnosis present

## 2011-06-01 DIAGNOSIS — Z85038 Personal history of other malignant neoplasm of large intestine: Secondary | ICD-10-CM

## 2011-06-01 DIAGNOSIS — E785 Hyperlipidemia, unspecified: Secondary | ICD-10-CM | POA: Diagnosis present

## 2011-06-01 DIAGNOSIS — G7000 Myasthenia gravis without (acute) exacerbation: Secondary | ICD-10-CM | POA: Diagnosis present

## 2011-06-01 DIAGNOSIS — J440 Chronic obstructive pulmonary disease with acute lower respiratory infection: Secondary | ICD-10-CM | POA: Diagnosis present

## 2011-06-01 HISTORY — DX: Heart failure, unspecified: I50.9

## 2011-06-01 LAB — CARDIAC PANEL(CRET KIN+CKTOT+MB+TROPI)
CK, MB: 5.1 ng/mL — ABNORMAL HIGH (ref 0.3–4.0)
Relative Index: 1.6 (ref 0.0–2.5)
Relative Index: 1.6 (ref 0.0–2.5)
Total CK: 312 U/L — ABNORMAL HIGH (ref 7–232)
Total CK: 263 U/L — ABNORMAL HIGH (ref 7–232)

## 2011-06-01 LAB — PRO B NATRIURETIC PEPTIDE: Pro B Natriuretic peptide (BNP): 1473 pg/mL — ABNORMAL HIGH (ref 0–450)

## 2011-06-01 LAB — DIFFERENTIAL
Eosinophils Absolute: 0 10*3/uL (ref 0.0–0.7)
Eosinophils Relative: 1 % (ref 0–5)
Lymphocytes Relative: 21 % (ref 12–46)
Lymphs Abs: 0.9 10*3/uL (ref 0.7–4.0)
Monocytes Relative: 13 % — ABNORMAL HIGH (ref 3–12)
Neutrophils Relative %: 65 % (ref 43–77)

## 2011-06-01 LAB — HEMOGLOBIN A1C: Mean Plasma Glucose: 146 mg/dL — ABNORMAL HIGH (ref <117)

## 2011-06-01 LAB — CBC
HCT: 30.2 % — ABNORMAL LOW (ref 39.0–52.0)
Hemoglobin: 10 g/dL — ABNORMAL LOW (ref 13.0–17.0)
MCH: 31.6 pg (ref 26.0–34.0)
MCHC: 33.1 g/dL (ref 30.0–36.0)
MCV: 95.6 fL (ref 78.0–100.0)
Platelets: 135 K/uL — ABNORMAL LOW (ref 150–400)
RBC: 3.16 MIL/uL — ABNORMAL LOW (ref 4.22–5.81)
RDW: 14.7 % (ref 11.5–15.5)
WBC: 4.1 K/uL (ref 4.0–10.5)

## 2011-06-01 LAB — HEPATIC FUNCTION PANEL
ALT: 14 U/L (ref 0–53)
Albumin: 3.1 g/dL — ABNORMAL LOW (ref 3.5–5.2)
Alkaline Phosphatase: 54 U/L (ref 39–117)
Total Bilirubin: 0.3 mg/dL (ref 0.3–1.2)
Total Protein: 6 g/dL (ref 6.0–8.3)

## 2011-06-01 LAB — BASIC METABOLIC PANEL
BUN: 18 mg/dL (ref 6–23)
CO2: 26 mEq/L (ref 19–32)
GFR calc non Af Amer: 59 mL/min — ABNORMAL LOW (ref >90)
Glucose, Bld: 118 mg/dL — ABNORMAL HIGH (ref 70–99)
Potassium: 3.9 mEq/L (ref 3.5–5.1)
Sodium: 138 mEq/L (ref 135–145)

## 2011-06-01 LAB — TROPONIN I: Troponin I: 0.6 ng/mL (ref <0.30)

## 2011-06-01 LAB — GLUCOSE, CAPILLARY: Glucose-Capillary: 123 mg/dL — ABNORMAL HIGH (ref 70–99)

## 2011-06-01 MED ORDER — ACETAMINOPHEN 325 MG PO TABS
650.0000 mg | ORAL_TABLET | Freq: Four times a day (QID) | ORAL | Status: DC | PRN
  Administered 2011-06-04 – 2011-06-06 (×2): 650 mg via ORAL
  Filled 2011-06-01 (×2): qty 2

## 2011-06-01 MED ORDER — INSULIN ASPART 100 UNIT/ML ~~LOC~~ SOLN
0.0000 [IU] | Freq: Three times a day (TID) | SUBCUTANEOUS | Status: DC
  Administered 2011-06-02 – 2011-06-06 (×9): 2 [IU] via SUBCUTANEOUS

## 2011-06-01 MED ORDER — SODIUM CHLORIDE 0.9 % IJ SOLN
3.0000 mL | Freq: Two times a day (BID) | INTRAMUSCULAR | Status: DC
  Administered 2011-06-01 – 2011-06-06 (×7): 3 mL via INTRAVENOUS
  Filled 2011-06-01 (×4): qty 3

## 2011-06-01 MED ORDER — MOXIFLOXACIN HCL IN NACL 400 MG/250ML IV SOLN
400.0000 mg | INTRAVENOUS | Status: DC
  Administered 2011-06-01 – 2011-06-04 (×4): 400 mg via INTRAVENOUS
  Filled 2011-06-01 (×5): qty 250

## 2011-06-01 MED ORDER — ONDANSETRON HCL 4 MG/2ML IJ SOLN: 4.0000 mg | Freq: Four times a day (QID) | INTRAMUSCULAR | Status: DC | PRN

## 2011-06-01 MED ORDER — DOCUSATE SODIUM 100 MG PO CAPS
100.0000 mg | ORAL_CAPSULE | Freq: Two times a day (BID) | ORAL | Status: DC
  Administered 2011-06-01 – 2011-06-05 (×7): 100 mg via ORAL
  Filled 2011-06-01 (×8): qty 1

## 2011-06-01 MED ORDER — METOPROLOL TARTRATE 50 MG PO TABS
25.0000 mg | ORAL_TABLET | Freq: Every day | ORAL | Status: DC
  Administered 2011-06-01 – 2011-06-06 (×6): 25 mg via ORAL
  Filled 2011-06-01 (×5): qty 1
  Filled 2011-06-01: qty 2

## 2011-06-01 MED ORDER — SODIUM CHLORIDE 0.9 % IJ SOLN
3.0000 mL | Freq: Two times a day (BID) | INTRAMUSCULAR | Status: DC
  Administered 2011-06-01 – 2011-06-03 (×3): 3 mL via INTRAVENOUS
  Filled 2011-06-01 (×8): qty 3

## 2011-06-01 MED ORDER — ACETAMINOPHEN 650 MG RE SUPP: 650.0000 mg | Freq: Four times a day (QID) | RECTAL | Status: DC | PRN

## 2011-06-01 MED ORDER — LOPERAMIDE HCL 2 MG PO CAPS
2.0000 mg | ORAL_CAPSULE | ORAL | Status: DC | PRN
  Administered 2011-06-04 – 2011-06-06 (×4): 2 mg via ORAL
  Filled 2011-06-01 (×5): qty 1

## 2011-06-01 MED ORDER — ONDANSETRON HCL 4 MG PO TABS: 4.0000 mg | ORAL_TABLET | Freq: Four times a day (QID) | ORAL | Status: DC | PRN

## 2011-06-01 MED ORDER — FUROSEMIDE 10 MG/ML IJ SOLN
40.0000 mg | Freq: Two times a day (BID) | INTRAMUSCULAR | Status: DC
  Administered 2011-06-01 – 2011-06-05 (×8): 40 mg via INTRAVENOUS
  Filled 2011-06-01 (×9): qty 4

## 2011-06-01 MED ORDER — PANTOPRAZOLE SODIUM 40 MG PO TBEC
40.0000 mg | DELAYED_RELEASE_TABLET | Freq: Every day | ORAL | Status: DC
  Administered 2011-06-01 – 2011-06-06 (×6): 40 mg via ORAL
  Filled 2011-06-01 (×6): qty 1

## 2011-06-01 MED ORDER — LEVALBUTEROL HCL 0.63 MG/3ML IN NEBU
0.6300 mg | INHALATION_SOLUTION | Freq: Four times a day (QID) | RESPIRATORY_TRACT | Status: DC
  Administered 2011-06-01 – 2011-06-06 (×13): 0.63 mg via RESPIRATORY_TRACT
  Filled 2011-06-01 (×14): qty 3

## 2011-06-01 MED ORDER — ASPIRIN 325 MG PO TABS
325.0000 mg | ORAL_TABLET | Freq: Every day | ORAL | Status: DC
  Administered 2011-06-01 – 2011-06-06 (×6): 325 mg via ORAL
  Filled 2011-06-01 (×6): qty 1

## 2011-06-01 MED ORDER — GUAIFENESIN ER 600 MG PO TB12
1200.0000 mg | ORAL_TABLET | Freq: Two times a day (BID) | ORAL | Status: DC
  Administered 2011-06-01 – 2011-06-06 (×11): 1200 mg via ORAL
  Filled 2011-06-01 (×11): qty 2

## 2011-06-01 MED ORDER — SODIUM CHLORIDE 0.9 % IJ SOLN
3.0000 mL | INTRAMUSCULAR | Status: DC | PRN
  Filled 2011-06-01: qty 3

## 2011-06-01 MED ORDER — MOXIFLOXACIN HCL IN NACL 400 MG/250ML IV SOLN
INTRAVENOUS | Status: AC
  Filled 2011-06-01: qty 250

## 2011-06-01 MED ORDER — HYDROCODONE-ACETAMINOPHEN 5-325 MG PO TABS
1.0000 | ORAL_TABLET | ORAL | Status: DC | PRN
  Administered 2011-06-05: 2 via ORAL
  Filled 2011-06-01: qty 2

## 2011-06-01 MED ORDER — PYRIDOSTIGMINE BROMIDE 60 MG PO TABS
60.0000 mg | ORAL_TABLET | Freq: Four times a day (QID) | ORAL | Status: DC
  Administered 2011-06-02 – 2011-06-06 (×16): 60 mg via ORAL
  Filled 2011-06-01 (×34): qty 1

## 2011-06-01 MED ORDER — ALUM & MAG HYDROXIDE-SIMETH 200-200-20 MG/5ML PO SUSP: 30.0000 mL | Freq: Four times a day (QID) | ORAL | Status: DC | PRN

## 2011-06-01 MED ORDER — OMEGA-3-ACID ETHYL ESTERS 1 G PO CAPS
1.0000 g | ORAL_CAPSULE | Freq: Two times a day (BID) | ORAL | Status: DC
  Administered 2011-06-01 – 2011-06-06 (×10): 1 g via ORAL
  Filled 2011-06-01 (×10): qty 1

## 2011-06-01 MED ORDER — LORAZEPAM 1 MG PO TABS
0.5000 mg | ORAL_TABLET | Freq: Four times a day (QID) | ORAL | Status: DC | PRN
  Administered 2011-06-01 – 2011-06-05 (×7): 0.5 mg via ORAL
  Filled 2011-06-01 (×7): qty 1

## 2011-06-01 MED ORDER — LISINOPRIL 10 MG PO TABS
10.0000 mg | ORAL_TABLET | Freq: Every day | ORAL | Status: DC
  Administered 2011-06-01 – 2011-06-06 (×6): 10 mg via ORAL
  Filled 2011-06-01 (×6): qty 1

## 2011-06-01 MED ORDER — FUROSEMIDE 10 MG/ML IJ SOLN
40.0000 mg | Freq: Once | INTRAMUSCULAR | Status: AC
  Administered 2011-06-01: 40 mg via INTRAVENOUS
  Filled 2011-06-01: qty 4

## 2011-06-01 MED ORDER — INSULIN ASPART 100 UNIT/ML ~~LOC~~ SOLN: 0.0000 [IU] | Freq: Every day | SUBCUTANEOUS | Status: DC

## 2011-06-01 MED ORDER — ALBUTEROL SULFATE (5 MG/ML) 0.5% IN NEBU
5.0000 mg | INHALATION_SOLUTION | Freq: Once | RESPIRATORY_TRACT | Status: AC
  Administered 2011-06-01: 5 mg via RESPIRATORY_TRACT
  Filled 2011-06-01: qty 1

## 2011-06-01 MED ORDER — POTASSIUM CHLORIDE CRYS ER 20 MEQ PO TBCR
20.0000 meq | EXTENDED_RELEASE_TABLET | Freq: Every day | ORAL | Status: DC
  Administered 2011-06-01 – 2011-06-06 (×6): 20 meq via ORAL
  Filled 2011-06-01 (×6): qty 1

## 2011-06-01 MED ORDER — SIMVASTATIN 20 MG PO TABS
40.0000 mg | ORAL_TABLET | Freq: Every day | ORAL | Status: DC
  Administered 2011-06-02 – 2011-06-06 (×5): 40 mg via ORAL
  Filled 2011-06-01 (×2): qty 2
  Filled 2011-06-01 (×2): qty 1
  Filled 2011-06-01 (×2): qty 2

## 2011-06-01 MED ORDER — ENOXAPARIN SODIUM 40 MG/0.4ML ~~LOC~~ SOLN
40.0000 mg | SUBCUTANEOUS | Status: DC
  Administered 2011-06-01 – 2011-06-06 (×6): 40 mg via SUBCUTANEOUS
  Filled 2011-06-01 (×6): qty 0.4

## 2011-06-01 MED ORDER — SODIUM CHLORIDE 0.9 % IV SOLN: 250.0000 mL | INTRAVENOUS | Status: DC | PRN

## 2011-06-01 MED ORDER — GUAIFENESIN-DM 100-10 MG/5ML PO SYRP: 5.0000 mL | ORAL_SOLUTION | ORAL | Status: DC | PRN

## 2011-06-01 NOTE — H&P (Addendum)
Howard [name] MRN: [id number] DOB/AGE: Oct 15, 1929 78 y.o. Primary Care Physician:MCINNIS,ANGUS G, MD, MD Admit date: 06/01/2011 Chief Complaint: Shortness of breath HPI: This is an 78 year old who came to the emergency room with increased shortness of breath. He had apparently seen his primary care physician 3 times in last week and was treated but it did not seem to improve his situation. He eventually came to the emergency room. He said he had to sit up to sleep. He has been coughing nonproductively. He has not had a fever. He has not had any chest pain.  Past Medical History  Diagnosis Date  . Diabetes mellitus     type II  . Hypertension   . Pneumonia 2007  . Hyperlipidemia   . Adenocarcinoma 2003    COLON  . PVD (peripheral vascular disease)     ABI 0.78 RIGHT AND 0.80 LEFT 4/11  . Osteoarthritis     OF KNEES  . Kidney stone   . Schatzki's ring 08/05/10    egd with Dr. Jena Gauss  . Hiatal hernia 08/05/10    egd with Dr. Jena Gauss  . Esophageal dysmotility   . Microscopic colitis   . Internal hemorrhoids   . Diverticula of colon   . CHF (congestive heart failure)    Past Surgical History  Procedure Date  . Hemicolectomy 2003  . Appendectomy   . Colonoscopy 04/2001  . Esophagogastroduodenoscopy 04/2001  . Esophagogastroduodenoscopy 08/05/10    schatzki ring, hiatal hernia  . Colonoscopy 09/04/09    diverticula, hemorrhoids        No family history on file. Positive for COPD coronary disease and congestive heart failure Social History:  reports that he has quit smoking. He has never used smokeless tobacco. He reports that he does not drink alcohol or use illicit drugs.   Allergies: No Known Allergies  Medications Prior to Admission  Medication Dose Route Frequency Provider Last Rate Last Dose  . albuterol (PROVENTIL) (5 MG/ML) 0.5% nebulizer solution 5 mg  5 mg Nebulization Once [name], MD      . furosemide (LASIX) injection 40 mg  40 mg Intravenous Once [name], MD   40 mg at 06/01/11 1442   Medications Prior to Admission  Medication Sig Dispense Refill  . albuterol (PROAIR HFA) 108 (90 BASE) MCG/ACT inhaler Inhale 2 puffs into the lungs every 6 (six) hours as needed.        Marland Kitchen aspirin 325 MG tablet Take 325 mg by mouth daily.        . Cinnamon 500 MG TABS Take by mouth 2 (two) times daily.        . furosemide (LASIX) 20 MG tablet Take 20 mg by mouth 2 (two) times daily.        Marland Kitchen lisinopril (PRINIVIL,ZESTRIL) 20 MG tablet Take 10 mg by mouth daily.      Marland Kitchen loperamide (IMODIUM) 2 MG capsule Take 2 mg by mouth as needed.       Marland Kitchen LORazepam (ATIVAN) 0.5 MG tablet Take 0.5 mg by mouth as needed.        . metFORMIN (GLUCOPHAGE) 500 MG tablet Take 500 mg by mouth 2 (two) times daily.        . metoprolol tartrate (LOPRESSOR) 25 MG tablet Take 25 mg by mouth daily.       . Omega-3 Fatty Acids (FISH OIL) 1000 MG CAPS Take 1 capsule by mouth daily.        . pantoprazole (PROTONIX)  40 MG tablet       . potassium chloride SA (K-DUR,KLOR-CON) 20 MEQ tablet Take 20 mEq by mouth daily.        Marland Kitchen pyridostigmine (MESTINON) 60 MG tablet Take 60 mg by mouth. AS DIRECTED       . simvastatin (ZOCOR) 40 MG tablet Take 40 mg by mouth.             ZOX:WRUEA from the symptoms mentioned above,there are no other symptoms referable to all systems reviewed.  Physical Exam: Blood pressure 144/57, pulse 65, temperature 98.7 F (37.1 C), temperature source Oral, resp. rate 23, height 5\' 8"  (1.727 m), weight 85.73 kg (189 lb), SpO2 96.00%. He is an elderly male who is awake and alert and does not appear to be in acute distress. His pupils are reactive. His nose and throat clear. His neck is supple without masses he does not have definite JVD while he is upright. His chest shows decreased breath sounds. He has some rales in the bases. His heart is regular without gallop. His abdomen is soft without masses bowel sounds are present and active. He has trace edema of the lower  extremities. His central nervous system examination is grossly intact    Basename 06/01/11 1145  WBC 4.1  NEUTROABS 2.7  HGB 10.0*  HCT 30.2*  MCV 95.6  PLT 135*    Basename 06/01/11 1145  NA 138  K 3.9  CL 103  CO2 26  GLUCOSE 118*  BUN 18  CREATININE 1.13  CALCIUM 8.9  MG --  lablast2(ast:2,ALT:2,alkphos:2,bilitot:2,prot:2,albumin:2)@    No results found for this or any previous visit (from the past 240 hour(s)).   Dg Chest Portable 1 View  06/01/2011  *RADIOLOGY REPORT*  Clinical Data: Cough, congestion and wheezing.  PORTABLE CHEST - 1 VIEW  Comparison: 03/15/2011  Findings: Stable cardiomegaly and mediastinal widening which on prior CT has been demonstrated to represent lipomatosis.  No pulmonary edema or focal pulmonary consolidation.  No significant pleural effusions.  IMPRESSION: Stable cardiomegaly and mediastinal lipomatosis.  No acute findings.  Original Report Authenticated By: Reola Calkins, M.D.   Impression: With an elevated BNP he could be treated as if he has congestive heart failure. He says he was told he had CHF when he saw a cardiologist. He has acute bronchitis as well I think. He is not clear but I think he has myasthenia gravis as well based on his medication list and the fact that he says that he had problems with his eyelids drooping Active Problems:  * No active hospital problems. *      Plan: He will be treated with antibiotics inhaled bronchodilators and diuretics. He will have echocardiogram in the morning      Howard [name] L Pager [phone]  06/01/2011, 2:58 PM

## 2011-06-01 NOTE — ED Notes (Signed)
Pt reports cold symptoms x 1 week.  Has wet sounding cough but says cough has not been productive.  Saw his pcp Friday and started him on antibiotics for bronchitis.  Pt says is still very congested and is having difficulty breathing.  Unsure if has had fever or not.   Pt says cannot tolerate laying down due to worsening SOB.

## 2011-06-01 NOTE — ED Notes (Addendum)
CRITICAL VALUE ALERT  Critical value received:  Troponin of 0.60  Date of notification:  06/01/2011  Time of notification:  1236  Critical value read back:yes  Nurse who received alert:  GJ  MD notified (1st page):  [place]  Time of first page:  1237  MD notified (2nd page):  Time of second page:  Responding MD:  Estell Harpin  Time MD responded:  1238

## 2011-06-01 NOTE — ED Notes (Signed)
Report called to 3A

## 2011-06-01 NOTE — ED Provider Notes (Signed)
History  Scribed for [name], MD, the patient was seen in room [facility]. This chart was scribed by Candelaria Stagers. The patient's care started at 11:53 AM    CSN: [id number]  Arrival date & time 06/01/11  1117   First MD Initiated Contact with Patient 06/01/11 1149      Chief Complaint  Patient presents with  . Cough  . Shortness of Breath     Patient is a 78 y.o. male presenting with cough and shortness of breath.  Cough This is a new problem. The current episode started more than 1 week ago. The problem occurs constantly. The problem has been gradually worsening. The cough is non-productive. There has been no fever. Associated symptoms include chills, shortness of breath and wheezing. Treatments tried: tylenol, antibiotic. The treatment provided no relief. He is not a smoker.  Shortness of Breath  Associated symptoms include shortness of breath and wheezing. Pertinent negatives include no fever and no cough.   Howard [name] is a 78 y.o. male who presents to the Emergency Department complaining of SOB and cough that started about one week ago.  Pt was seen by his PCP Dr. Renard Matter three times this week and was given and antibiotic.  Sx have not improved.  He is experiencing wheezing and congestion as well and denies fever.  He has taken tylenol with no relief.  He has h/o colon cancer.   Dr. Daleen Squibb is cardiologist  Past Medical History  Diagnosis Date  . Diabetes mellitus     type II  . Hypertension   . Pneumonia 2007  . Hyperlipidemia   . Adenocarcinoma 2003    COLON  . PVD (peripheral vascular disease)     ABI 0.78 RIGHT AND 0.80 LEFT 4/11  . Osteoarthritis     OF KNEES  . Kidney stone   . Schatzki's ring 08/05/10    egd with Dr. Jena Gauss  . Hiatal hernia 08/05/10    egd with Dr. Jena Gauss  . Esophageal dysmotility   . Microscopic colitis   . Internal hemorrhoids   . Diverticula of colon   . CHF (congestive heart failure)     Past Surgical History  Procedure Date    . Hemicolectomy 2003  . Appendectomy   . Colonoscopy 04/2001  . Esophagogastroduodenoscopy 04/2001  . Esophagogastroduodenoscopy 08/05/10    schatzki ring, hiatal hernia  . Colonoscopy 09/04/09    diverticula, hemorrhoids    No family history on file.  History  Substance Use Topics  . Smoking status: Former Games developer  . Smokeless tobacco: Never Used   Comment: Quit x 20 years  . Alcohol Use: No      Review of Systems  Constitutional: Positive for chills. Negative for fever.  Respiratory: Positive for shortness of breath and wheezing. Negative for cough.   All other systems reviewed and are negative.    Allergies  Review of patient's allergies indicates no known allergies.  Home Medications   Current Outpatient Rx  Name Route Sig Dispense Refill  . ALBUTEROL SULFATE HFA 108 (90 BASE) MCG/ACT IN AERS Inhalation Inhale 2 puffs into the lungs every 6 (six) hours as needed.      . ASPIRIN 325 MG PO TABS Oral Take 325 mg by mouth daily.      Marland Kitchen CINNAMON 500 MG PO TABS Oral Take by mouth 2 (two) times daily.      . FUROSEMIDE 20 MG PO TABS Oral Take 20 mg by mouth 2 (two) times daily.      Marland Kitchen  LISINOPRIL 20 MG PO TABS Oral Take 10 mg by mouth daily.    Marland Kitchen LOPERAMIDE HCL 2 MG PO CAPS Oral Take 2 mg by mouth as needed.     Marland Kitchen LORAZEPAM 0.5 MG PO TABS Oral Take 0.5 mg by mouth as needed.      Marland Kitchen METFORMIN HCL 500 MG PO TABS Oral Take 500 mg by mouth 2 (two) times daily.      Marland Kitchen METOPROLOL TARTRATE 25 MG PO TABS Oral Take 25 mg by mouth daily.     Marland Kitchen FISH OIL 1000 MG PO CAPS Oral Take 1 capsule by mouth daily.      Marland Kitchen PANTOPRAZOLE SODIUM 40 MG PO TBEC      . POTASSIUM CHLORIDE CRYS ER 20 MEQ PO TBCR Oral Take 20 mEq by mouth daily.      Marland Kitchen PYRIDOSTIGMINE BROMIDE 60 MG PO TABS Oral Take 60 mg by mouth. AS DIRECTED     . SIMVASTATIN 40 MG PO TABS Oral Take 40 mg by mouth.        BP 139/59  Pulse 67  Temp(Src) 98.7 F (37.1 C) (Oral)  Resp 22  Ht 5\' 8"  (1.727 m)  Wt 189 lb (85.73 kg)   BMI 28.74 kg/m2  SpO2 95%  Physical Exam  Nursing note and vitals reviewed. Constitutional: He is oriented to person, place, and time. He appears well-developed and well-nourished.  HENT:  Head: Normocephalic and atraumatic.  Eyes: EOM are normal. Right eye exhibits no discharge. Left eye exhibits no discharge.  Neck: Normal range of motion. Neck supple.  Pulmonary/Chest: He has wheezes (bilaterally).  Abdominal: There is no tenderness.       Healed abdominal scar  Musculoskeletal: Normal range of motion.  Neurological: He is alert and oriented to person, place, and time.  Skin: Skin is warm and dry.  Psychiatric: He has a normal mood and affect. His behavior is normal.    ED Course  Procedures   DIAGNOSTIC STUDIES: Oxygen Saturation is 95% on room air, normal by my interpretation.    COORDINATION OF CARE:  12:00PM Ordered: Pro b natriuretic peptide (BNP) ; Hepatic function panel ; Troponin I  2:21 PM Recheck: Discussed results with pt and course of care.  Still experiencing minimal wheezing.    Labs Reviewed  CBC - Abnormal; Notable for the following:    RBC 3.16 (*)    Hemoglobin 10.0 (*)    HCT 30.2 (*)    Platelets 135 (*)    All other components within normal limits  DIFFERENTIAL - Abnormal; Notable for the following:    Monocytes Relative 13 (*)    All other components within normal limits  BASIC METABOLIC PANEL - Abnormal; Notable for the following:    Glucose, Bld 118 (*)    GFR calc non Af Amer 59 (*)    GFR calc Af Amer 69 (*)    All other components within normal limits  PRO B NATRIURETIC PEPTIDE - Abnormal; Notable for the following:    Pro B Natriuretic peptide (BNP) 1473.0 (*)    All other components within normal limits  HEPATIC FUNCTION PANEL - Abnormal; Notable for the following:    Albumin 3.1 (*)    All other components within normal limits  TROPONIN I - Abnormal; Notable for the following:    Troponin I 0.60 (*)    All other components within  normal limits   Dg Chest Portable 1 View  06/01/2011  *RADIOLOGY REPORT*  Clinical  Data: Cough, congestion and wheezing.  PORTABLE CHEST - 1 VIEW  Comparison: 03/15/2011  Findings: Stable cardiomegaly and mediastinal widening which on prior CT has been demonstrated to represent lipomatosis.  No pulmonary edema or focal pulmonary consolidation.  No significant pleural effusions.  IMPRESSION: Stable cardiomegaly and mediastinal lipomatosis.  No acute findings.  Original Report Authenticated By: Reola Calkins, M.D.     No diagnosis found.    Date: 06/01/2011  Rate: 65  Rhythm: normal sinus rhythm  QRS Axis: normal  Intervals: normal  ST/T Wave abnormalities: nonspecific ST changes  Conduction Disutrbances:none  Narrative Interpretation:   Old EKG Reviewed: none available   MDM  The chart was scribed for me under my direct supervision.  I personally performed the history, physical, and medical decision making and all procedures in the evaluation of this patient.[name], MD 06/01/11 [phone]

## 2011-06-02 ENCOUNTER — Encounter (HOSPITAL_COMMUNITY): Payer: Self-pay | Admitting: Adult Health

## 2011-06-02 ENCOUNTER — Other Ambulatory Visit: Payer: Self-pay

## 2011-06-02 DIAGNOSIS — R0989 Other specified symptoms and signs involving the circulatory and respiratory systems: Secondary | ICD-10-CM

## 2011-06-02 DIAGNOSIS — J42 Unspecified chronic bronchitis: Secondary | ICD-10-CM | POA: Diagnosis present

## 2011-06-02 DIAGNOSIS — I517 Cardiomegaly: Secondary | ICD-10-CM

## 2011-06-02 DIAGNOSIS — R0609 Other forms of dyspnea: Secondary | ICD-10-CM

## 2011-06-02 LAB — GLUCOSE, CAPILLARY
Glucose-Capillary: 143 mg/dL — ABNORMAL HIGH (ref 70–99)
Glucose-Capillary: 143 mg/dL — ABNORMAL HIGH (ref 70–99)
Glucose-Capillary: 132 mg/dL — ABNORMAL HIGH (ref 70–99)

## 2011-06-02 LAB — CARDIAC PANEL(CRET KIN+CKTOT+MB+TROPI): Troponin I: 0.78 ng/mL (ref <0.30)

## 2011-06-02 NOTE — Progress Notes (Signed)
CRITICAL VALUE ALERT  Critical value received:  Troponin 0.78  Date of notification:  06-02-2011  Time of notification:  0819  Critical value read back:yes  Nurse who received alert:  Sophronia Simas RN   MD notified (1st page):  [place]  Time of first page:  [phone]  MD notified (2nd page):  Time of second page:  Responding MD:  [place]is  Time MD responded:  [phone] called office

## 2011-06-02 NOTE — Progress Notes (Signed)
*  PRELIMINARY RESULTS* Echocardiogram 2D Echocardiogram has been performed.  Howard [name] 06/02/2011, 11:30 AM

## 2011-06-02 NOTE — Progress Notes (Signed)
NAME:  Howard [name], Howard [name]                 ACCOUNT NO.:  [id number]  MEDICAL RECORD NO.:  [id number]  LOCATION:  A329                          FACILITY:  [facility]  PHYSICIAN:  Fontella Shan G. Renard Matter, MD   DATE OF BIRTH:  03-27-1929  DATE OF PROCEDURE:  06/02/2011 DATE OF DISCHARGE:                                PROGRESS NOTE   SUBJECTIVE:  This patient came into the emergency room with shortness of breath.  He has known diastolic congestive heart failure, had elevated BNP as well as acute bronchitis.  Chest x-ray shows stable cardiomegaly. No acute findings.  His cardiac markers are slightly elevated.  ProBNP was 1473.  OBJECTIVE:  VITAL SIGNS:  Blood pressure 180/80, respirations 20, pulse 82, temperature 97.9. LUNGS:  Rhonchi bilaterally. HEART:  Regular rhythm. ABDOMEN:  No palpable organs or masses.  ASSESSMENT:  The patient was admitted with what was felt to be bronchitis.  He does have a history of congestive heart failure and diabetes.  PLAN:  To continue current IV antibiotic Avelox 400 mg Howard [name], Lasix 40 mg every 12 hours, and nebulization.  We will obtain Cardiology consult.     Luwana Butrick G. Renard Matter, MD     AGM/MEDQ  D:  06/02/2011  T:  06/02/2011  Job:  [id number]

## 2011-06-02 NOTE — Progress Notes (Signed)
Critical Troponin level called to physician.  Physician stated to continue to monitor patient.

## 2011-06-02 NOTE — Plan of Care (Signed)
Problem: Phase I Progression Outcomes Goal: Voiding-avoid urinary catheter unless indicated Outcome: Not Met (add Reason) Foley placed on 4/7.

## 2011-06-02 NOTE — Progress Notes (Signed)
UR Chart Review Completed  

## 2011-06-02 NOTE — Consult Note (Signed)
CARDIOLOGY CONSULT NOTE  Patient ID: Howard [name] MRN: [id number] DOB/AGE: Feb 20, 1930 78 y.o.  Admit date: 06/01/2011 Referring Physician: Juanetta Gosling Primary Larwance Sachs, MD, MD Primary Cardiologist: Valera Castle Reason for Consultation: Dyspnea and Positive Troponin Active Problems:  Chronic diastolic congestive heart failure  Bronchitis, chronic  HPI: Howard [name] is an 78 year old patient of Dr. Elijah Birk Garyn Waguespack with known history of mixed CHF, chronic bronchitis, history of colon cancer, who presented to the emergency room after 3-4 days of increasing shortness of breath and PND. He was seen by his primary care physician Dr. Megan Mans the Friday prior to admission with similar symptoms. He was continued on his current medication regimen and also placed on antibiotics per Dr. Megan Mans. He states over the last 2 days. He was unable to sleep through the night without having to get up and sit in a chair in order to breathe. He had frequent coughing, but denied any chills, fever, or or dizziness. He apparently has chronic shortness of breath, but this became progressively worse. I while in the emergency room patient's blood pressure was 139/59 with a heart rate of 67 and an SpO2 of 93%. Chest x-ray revealed stable cardiomegaly with mediastinal lipomatosis. No evidence of CHF or pulmonary edema. BNP was elevated at 1473. He was treated with IV Lasix 40 mg and also albuterol inhaler. He states he is feeling better, but he remains congested with frequent coughing. It was noted during cycling of cardiac enzymes that his cardiac enzymes were elevated with troponin of 0.69 0.80 and 0.78 respectively.    He had been seen in the past by Dr. Daleen Squibb in February 2013 and was recommended to have a stress test for evaluation for coronary artery disease with multiple cardiovascular risk factors and strong family history. The patient did not followup and have these tests completed. He did have an echocardiogram completed  in January revealing EF of 45% with akinesis of the inferior Jamaury Gumz with thinning indicative of myocardial infarction. We are asked for further recommendations in this setting. He denies any chest pain. He denies any palpitations.  Review of systems complete and found to be negative unless listed above   Past Medical History  Diagnosis Date  . Diabetes mellitus     type II  . Hypertension   . Pneumonia 2007  . Hyperlipidemia   . Adenocarcinoma 2003    COLON  . PVD (peripheral vascular disease)     ABI 0.78 RIGHT AND 0.80 LEFT 4/11  . Osteoarthritis     OF KNEES  . Kidney stone   . Schatzki's ring 08/05/10    egd with Dr. Jena Gauss  . Hiatal hernia 08/05/10    egd with Dr. Jena Gauss  . Esophageal dysmotility   . Microscopic colitis   . Internal hemorrhoids   . Diverticula of colon   . CHF (congestive heart failure)     Family History  Problem Relation Age of Onset  . Coronary artery disease Father   . Coronary artery disease Brother     CABG  . Coronary artery disease Brother     History   Social History  . Marital Status: Married    Spouse Name: N/A    Number of Children: N/A  . Years of Education: N/A   Occupational History  . Not on file.   Social History Main Topics  . Smoking status: Former Games developer  . Smokeless tobacco: Never Used   Comment: Quit x 20 years  . Alcohol Use: No  .  Drug Use: No  . Sexually Active: Not on file   Other Topics Concern  . Not on file   Social History Narrative   Lives in [place]. Married. Wife in [place].Son lives with him    Past Surgical History  Procedure Date  . Hemicolectomy 2003  . Appendectomy   . Colonoscopy 04/2001  . Esophagogastroduodenoscopy 04/2001  . Esophagogastroduodenoscopy 08/05/10    schatzki ring, hiatal hernia  . Colonoscopy 09/04/09    diverticula, hemorrhoids     Prescriptions prior to admission  Medication Sig Dispense Refill  . albuterol (PROAIR HFA) 108 (90 BASE) MCG/ACT inhaler Inhale 2 puffs into the  lungs every 6 (six) hours as needed.        Marland Kitchen aspirin 325 MG tablet Take 325 mg by mouth daily.        . Cinnamon 500 MG TABS Take by mouth 2 (two) times daily.        . furosemide (LASIX) 20 MG tablet Take 20 mg by mouth 2 (two) times daily.        Marland Kitchen lisinopril (PRINIVIL,ZESTRIL) 20 MG tablet Take 10 mg by mouth daily.      Marland Kitchen loperamide (IMODIUM) 2 MG capsule Take 2 mg by mouth as needed.       Marland Kitchen LORazepam (ATIVAN) 0.5 MG tablet Take 0.5 mg by mouth as needed.        . metFORMIN (GLUCOPHAGE) 500 MG tablet Take 500 mg by mouth 2 (two) times daily.        . metoprolol tartrate (LOPRESSOR) 25 MG tablet Take 25 mg by mouth daily.       . Omega-3 Fatty Acids (FISH OIL) 1000 MG CAPS Take 1 capsule by mouth daily.        . pantoprazole (PROTONIX) 40 MG tablet       . potassium chloride SA (K-DUR,KLOR-CON) 20 MEQ tablet Take 20 mEq by mouth daily.        Marland Kitchen pyridostigmine (MESTINON) 60 MG tablet Take 60 mg by mouth. AS DIRECTED       . simvastatin (ZOCOR) 40 MG tablet Take 40 mg by mouth.         Echocardiogram: 03/18/2011 Left ventricle: The cavity size was mildly dilated. There was mild to moderate concentric hypertrophy. Systolic function was mildly reduced. The estimated ejection fraction was in the range of 45% to 50%. Akinesis and thinning of the inferior myocardium; consistent with infarction. - Aortic valve: Moderately calcified annulus. Trileaflet; mildly thickened, mildly calcified leaflets. Cusp separation was mildly reduced. There was very mild stenosis. Trivial regurgitation. Valve area: 1.23cm^2(VTI). Valve area: 1.15cm^2 (Vmax). - Mitral valve: Mild regurgitation. - Left atrium: The atrium was mildly to moderately dilated. - Atrial septum: The septum bowed from left to right, consistent with increased left atrial pressure. No defect or patent foramen ovale was identified.   Physical Exam: Blood pressure 180/80, pulse 82, temperature 97.9 F (36.6 C), temperature source Oral,  resp. rate 20, height 5\' 8"  (1.727 m), weight 189 lb 1.4 oz (85.77 kg), SpO2 93.00%.   General: Well developed, well nourished, in no acute distress Head: Eyes PERRLA, No xanthomas.   Normal cephalic and atramatic Dentures  Lungs:Inspiratory/expiratory wheezes and rhonchi, prolonged expiratory phase. Frequent coughing Heart: HRRR S1 S2, difficult to auscultated with coarse loud lung sounds. 1/6 systolic murmur..  Pulses are 2+ & equal.            No carotid bruit. No JVD.  No abdominal bruits. No femoral bruits.  Abdomen: Bowel sounds are positive, abdomen soft and non-tender without masses or                  Hernia's noted. Msk:  Back normal, normal gait. Normal strength and tone for age. Extremities: No clubbing, cyanosis or edema.  DP +1 Neuro: Alert and oriented X 3. Psych:  Good affect, responds appropriately  Labs:   Lab Results  Component Value Date   WBC 4.1 06/01/2011   HGB 10.0* 06/01/2011   HCT 30.2* 06/01/2011   MCV 95.6 06/01/2011   PLT 135* 06/01/2011     Lab 06/01/11 1145  NA 138  K 3.9  CL 103  CO2 26  BUN 18  CREATININE 1.13  CALCIUM 8.9  PROT 6.0  BILITOT 0.3  ALKPHOS 54  ALT 14  AST 27  GLUCOSE 118*   BNP: 1473.0  Lab Results  Component Value Date   CKTOTAL 217 06/02/2011   CKMB 3.6 06/02/2011   TROPONINI 0.78* 06/02/2011        Radiology: Dg Chest Portable 1 View  06/01/2011  *RADIOLOGY REPORT*  Clinical Data: Cough, congestion and wheezing.  PORTABLE CHEST - 1 VIEW  Comparison: 03/15/2011  Findings: Stable cardiomegaly and mediastinal widening which on prior CT has been demonstrated to represent lipomatosis.  No pulmonary edema or focal pulmonary consolidation.  No significant pleural effusions.  IMPRESSION: Stable cardiomegaly and mediastinal lipomatosis.  No acute findings.  Original Report Authenticated By: Reola Calkins, M.D.   UVO:[id number] sinus rhythm rate 65 bpm ST & T wave abnormality, consider inferior ischemia  ASSESSMENT AND PLAN:   1.  Acute on chronic next CHF: He is now on IV Lasix but has not diuresed a considerable  amount. He states he is breathing better. He no longer has complaints of PND and was able to sleep better last night. Echocardiogram is pending. Most recent echo in January revealed an EF of 40-45%. He has not had cardiac stress testing as requested in the past. Would like to complete this during this hospitalization once his lungs have stabilized. He would not be a candidate for Lexiscan however, a dobutamine stress Myoview would be appropriate. We will review results of echo being completed. If significant changes may consider sending for cardiac catheterization. He has multiple cardiovascular risk factors with  further cardiac testing as appropriate  2. Acute on chronic bronchitis: He is being treated with Avelox and breathing treatments. Perhaps a course of steroids would be beneficial.   Signed: Bettey Mare. Lyman Bishop NP [facility] 06/02/2011, 9:19 AM Co-Sign MD I have taken a history, reviewed medications, allergies, PMH, SH, FH, and reviewed ROS and examined the patient.  I agree with the assessment and plan as discussed with Harriet Pho NP and the patient.  Today his Echo shows no change from study in January. If he will be compliant he can stay out of CHF and stay out of the hospital. I do not think he had an infarct, all secondary to ACDCHF. Meds reviewed and are adequate including PTA. No other recommendations.except risk stratification Myoview prior to discharge oncen he can lay flat.. Continue medical therapy for now.  Donie Moulton C. Daleen Squibb, MD, [facility] Pager:  [phone]

## 2011-06-02 NOTE — Progress Notes (Signed)
   CARE MANAGEMENT NOTE 06/02/2011  Patient:  Howard [name], Howard [name]   Account Number:  [id number]  Date Initiated:  06/02/2011  Documentation initiated by:  Sharrie Rothman  Subjective/Objective Assessment:   Pt admitted from home with son with CHF. Pt walks with cane but requires some assistance with ADL's.     Action/Plan:   Pt would benefit from [facility] RN and PT at discharge. PT consult ordered inhouse.   Anticipated DC Date:  06/08/2011   Anticipated DC Plan:  HOME W HOME HEALTH SERVICES      DC Planning Services  CM consult      Choice offered to / List presented to:             Status of service:  In process, will continue to follow Medicare Important Message given?   (If response is "NO", the following Medicare IM given date fields will be blank) Date Medicare IM given:   Date Additional Medicare IM given:    Discharge Disposition:  HOME W HOME HEALTH SERVICES  Per UR Regulation:    If discussed at Long Length of Stay Meetings, dates discussed:    Comments:  06/02/11 1552 Arlyss Queen, RN BSN CM

## 2011-06-03 ENCOUNTER — Inpatient Hospital Stay (HOSPITAL_COMMUNITY): Payer: Medicare Other

## 2011-06-03 DIAGNOSIS — I5032 Chronic diastolic (congestive) heart failure: Secondary | ICD-10-CM

## 2011-06-03 DIAGNOSIS — D649 Anemia, unspecified: Secondary | ICD-10-CM | POA: Diagnosis present

## 2011-06-03 LAB — GLUCOSE, CAPILLARY
Glucose-Capillary: 133 mg/dL — ABNORMAL HIGH (ref 70–99)
Glucose-Capillary: 115 mg/dL — ABNORMAL HIGH (ref 70–99)
Glucose-Capillary: 148 mg/dL — ABNORMAL HIGH (ref 70–99)

## 2011-06-03 MED ORDER — SODIUM CHLORIDE 0.9 % IV SOLN
10.0000 ug/kg | INTRAVENOUS | Status: DC
  Filled 2011-06-03 (×2): qty 12

## 2011-06-03 NOTE — Progress Notes (Signed)
NAME:  Howard [name], Howard [name]                 ACCOUNT NO.:  [id number]  MEDICAL RECORD NO.:  [id number]  LOCATION:  A329                          FACILITY:  [facility]  PHYSICIAN:  Tylor Gambrill G. Renard Matter, MD   DATE OF BIRTH:  Jun 18, 1929  DATE OF PROCEDURE: DATE OF DISCHARGE:                                PROGRESS NOTE   This patient had a fairly comfortable night with less shortness of breath.  He is being treated for acute-on-chronic bronchitis, CHF with low ejection fraction 45% and previous MI.  A 2D echo showed no changes from previous echocardiogram, 40-45% ejection fraction.  EKG showed inferior wall ischemia.  The patient has slightly elevated troponins. He was seen in consultation by [facility], Dr. Daleen Squibb.  OBJECTIVE:  VITAL SIGNS:  Blood pressure 136/62, respirations 20, pulse 83, temp 97.7. LUNGS:  Diminished breath sounds. HEART:  Regular rhythm. ABDOMEN:  No palpable organs or masses. EXTREMITIES:  Do not show evidence of edema.  ASSESSMENT:  The patient has acute-on-chronic bronchitis, does have coronary artery disease with previous myocardial infarction, diastolic congestive heart failure, and diabetes.  PLAN:  To continue IV antibiotics, Avelox 400 mg daily, Lasix 40 mg every 12 hours and nebulization.  Cardiology will decide on Myoview stress test prior to discharge and take current regimen.     Damauri Minion G. Renard Matter, MD     AGM/MEDQ  D:  06/03/2011  T:  06/03/2011  Job:  [id number]

## 2011-06-03 NOTE — Progress Notes (Addendum)
SUBJECTIVE:Feeling better. No chest pain or PND overnight.  Modest cough without significant sputum production.  Active Problems:  Chronic diastolic congestive heart failure  Bronchitis, chronic  LABS: Basic Metabolic Panel:  Basename 06/01/11 1145  NA 138  K 3.9  CL 103  CO2 26  GLUCOSE 118*  BUN 18  CREATININE 1.13  CALCIUM 8.9  MG --  PHOS --   Liver Function Tests:  Basename 06/01/11 1145  AST 27  ALT 14  ALKPHOS 54  BILITOT 0.3  PROT 6.0  ALBUMIN 3.1*   CBC:  Basename 06/01/11 1145  WBC 4.1  NEUTROABS 2.7  HGB 10.0*  HCT 30.2*  MCV 95.6  PLT 135*   Cardiac Enzymes:  Basename 06/02/11 0705 06/01/11 2308 06/01/11 1615  CKTOTAL 217 263* 312*  CKMB 3.6 4.2* 5.1*  CKMBINDEX -- -- --  TROPONINI 0.78* 0.80* 0.69*    Basename 06/01/11 1615  HGBA1C 6.7*   RADIOLOGY: Dg Chest Portable 1 View  06/01/2011  *RADIOLOGY REPORT*  Clinical Data: Cough, congestion and wheezing.  PORTABLE CHEST - 1 VIEW  Comparison: 03/15/2011  Findings: Stable cardiomegaly and mediastinal widening which on prior CT has been demonstrated to represent lipomatosis.  No pulmonary edema or focal pulmonary consolidation.  No significant pleural effusions.  IMPRESSION: Stable cardiomegaly and mediastinal lipomatosis.  No acute findings.  Original Report Authenticated By: Reola Calkins, M.D.   Echocardiogram: Completed but results pending.  PHYSICAL EXAM BP 136/62  Pulse 83  Temp(Src) 97.7 F (36.5 C) (Oral)  Resp 20  Ht 5\' 8"  (1.727 m)  Wt 189 lb 1.4 oz (85.77 kg)  BMI 28.75 kg/m2  SpO2 94% General: Well developed, well nourished, in no acute distress. HOH Head: Eyes ,esotropia on left Without apparent useful vision,  Normal cephalic and atramatic  Lungs:Inspriratory and expiratory wheezes with bibasilar crackles.  No coughing Heart: HRRR S1 S2,Distant .  Pulses are 2+ & equal.            No carotid bruit. No JVD.  No abdominal bruits. No femoral bruits. Abdomen: Bowel sounds  are positive, abdomen soft and non-tender without masses or                  Hernia's noted. Msk:  Back normal, normal gait. Normal strength and tone for age. Extremities: No clubbing, cyanosis or edema.  DP +1 Neuro: Alert and oriented X 3.HOH Psych:  Good affect, responds appropriately  TELEMETRY: Reviewed telemetry pt in: SR   ASSESSMENT AND PLAN:  1. Acute on Chronic Mixed CHF: He is improving in status. Awaiting echo results for LV fx changes. He continues to diurese but I/O not documented, nor is wt. This will be requested. Plan for dobutamine stress myoview in am as requested by Dr. Daleen Squibb. Will recheck CXR.  2. Chronic Bronchitis; Continues antibiotics and breathing treatments. May consider steroid dose pack as he continues significant wheezes.   Bettey Mare. Lyman Bishop NP [facility] 06/03/2011, 10:11 AM  Cardiology Attending Patient interviewed and examined. Discussed with Joni Reining, NP.  Above note annotated and modified based upon my findings.  Diagnosis is uncertain.  Some features of his illness are consistent with congestive heart failure, and there is moderate elevation of BNP level, but exam is benign, weight is not increased and chest x-ray on admission showed no evidence of CHF.  He is diuresed 2 L with apparent improvement, but has also received antibiotics, bronchodilators and steroids.   Alternative diagnosis is acute bronchitis.  Had modest tonic elevation of troponin could relate to either process.  Pharmacologic stress test is planned to exclude high-risk coronary disease.  Manifestations of bronchospasm has improved over the course of the day without a significant diuresis recorded.  If wheezing persists, metoprolol will be discontinued.  [place] Bing, MD 06/03/2011, 10:25 PM

## 2011-06-03 NOTE — Evaluation (Signed)
Physical Therapy Evaluation Patient Details Name: Howard [name] MRN: [id number] DOB: 16-Jan-1930 Today's Date: 06/03/2011  Problem List:  Patient Active Problem List  Diagnoses  . NEOPLASM, MALIGNANT, COLON, ADENOCARCINOMA  . COLITIS  . Osteoarthrosis, unspecified whether generalized or localized, lower leg  . HELICOBACTER PYLORI GASTRITIS, HX OF  . Dysphagia  . Diarrhea  . Chronic diastolic congestive heart failure  . Old MI (myocardial infarction)  . Bronchitis, chronic    Past Medical History:  Past Medical History  Diagnosis Date  . Diabetes mellitus     type II  . Hypertension   . Pneumonia 2007  . Hyperlipidemia   . Adenocarcinoma 2003    COLON  . PVD (peripheral vascular disease)     ABI 0.78 RIGHT AND 0.80 LEFT 4/11  . Osteoarthritis     OF KNEES  . Kidney stone   . Schatzki's ring 08/05/10    egd with Dr. Jena Gauss  . Hiatal hernia 08/05/10    egd with Dr. Jena Gauss  . Esophageal dysmotility   . Microscopic colitis   . Internal hemorrhoids   . Diverticula of colon   . CHF (congestive heart failure)    Past Surgical History:  Past Surgical History  Procedure Date  . Hemicolectomy 2003  . Appendectomy   . Colonoscopy 04/2001  . Esophagogastroduodenoscopy 04/2001  . Esophagogastroduodenoscopy 08/05/10    schatzki ring, hiatal hernia  . Colonoscopy 09/04/09    diverticula, hemorrhoids    PT Assessment/Plan/Recommendation PT Assessment Clinical Impression Statement: Pt states that he is feeling much better since the initiation of tx.  He appears to be close to functional baseline, but definately needs a walker for gait. PT Recommendation/Assessment: Patent does not need any further PT services No Skilled PT: Patient at baseline level of functioning PT Recommendation Follow Up Recommendations: No PT follow up Equipment Recommended: None recommended by PT PT Goals     PT Evaluation Precautions/Restrictions  Precautions Required Braces or Orthoses:  No Restrictions Weight Bearing Restrictions: No Prior Functioning  Home Living Lives With: Son (wife is in [place]) Receives Help From: Family Type of Home: House Home Layout: One level Home Access: Ramped entrance Bathroom Shower/Tub: Engineer, manufacturing systems: Standard Bathroom Accessibility: Yes Home Adaptive Equipment: Walker - rolling;Straight cane Prior Function Level of Independence: Independent with basic ADLs;Independent with homemaking with ambulation;Independent with gait;Independent with transfers;Requires assistive device for independence Driving: No Vocation: Retired Producer, television/film/video: Awake/alert Overall Cognitive Status: Appears within functional limits for tasks assessed Sensation/Coordination Sensation Light Touch: Appears Intact Proprioception: Appears Intact Coordination Gross Motor Movements are Fluid and Coordinated: Yes Fine Motor Movements are Fluid and Coordinated: Yes Extremity Assessment RUE Assessment RUE Assessment: Within Functional Limits LUE Assessment LUE Assessment: Within Functional Limits RLE Assessment RLE Assessment: Within Functional Limits LLE Assessment LLE Assessment: Within Functional Limits Mobility (including Balance) Bed Mobility Bed Mobility: No Transfers Transfers: Yes Sit to Stand: 6: Modified independent (Device/Increase time) Stand to Sit: 6: Modified independent (Device/Increase time) Ambulation/Gait Ambulation/Gait: Yes Ambulation/Gait Assistance: 6: Modified independent (Device/Increase time) (with walker) Ambulation Distance (Feet): 200 Feet Assistive device: Rolling walker Gait Pattern: Within Functional Limits Gait velocity: WNL Stairs: No Wheelchair Mobility Wheelchair Mobility: No  Posture/Postural Control Posture/Postural Control: No significant limitations Balance Balance Assessed: No Exercise    End of Session PT - End of Session Equipment Utilized During Treatment: Gait  belt Activity Tolerance: Patient tolerated treatment well Patient left: in chair;with call bell in reach;with bed alarm set Nurse Communication: Mobility status for  transfers;Mobility status for ambulation General Behavior During Session: [facility] for tasks performed Cognition: [facility] for tasks performed  [name] 06/03/2011, 4:37 PM

## 2011-06-04 ENCOUNTER — Encounter (HOSPITAL_COMMUNITY): Payer: Self-pay | Admitting: Cardiology

## 2011-06-04 ENCOUNTER — Inpatient Hospital Stay (HOSPITAL_COMMUNITY): Payer: Medicare Other

## 2011-06-04 ENCOUNTER — Encounter (HOSPITAL_COMMUNITY): Payer: Self-pay

## 2011-06-04 DIAGNOSIS — I2510 Atherosclerotic heart disease of native coronary artery without angina pectoris: Secondary | ICD-10-CM

## 2011-06-04 DIAGNOSIS — I509 Heart failure, unspecified: Secondary | ICD-10-CM

## 2011-06-04 DIAGNOSIS — J42 Unspecified chronic bronchitis: Secondary | ICD-10-CM

## 2011-06-04 DIAGNOSIS — I252 Old myocardial infarction: Secondary | ICD-10-CM

## 2011-06-04 LAB — BASIC METABOLIC PANEL
Calcium: 9 mg/dL (ref 8.4–10.5)
GFR calc Af Amer: 74 mL/min — ABNORMAL LOW (ref >90)
GFR calc non Af Amer: 64 mL/min — ABNORMAL LOW (ref >90)
Glucose, Bld: 139 mg/dL — ABNORMAL HIGH (ref 70–99)
Potassium: 3.8 mEq/L (ref 3.5–5.1)
Sodium: 141 mEq/L (ref 135–145)

## 2011-06-04 LAB — GLUCOSE, CAPILLARY
Glucose-Capillary: 141 mg/dL — ABNORMAL HIGH (ref 70–99)
Glucose-Capillary: 154 mg/dL — ABNORMAL HIGH (ref 70–99)
Glucose-Capillary: 151 mg/dL — ABNORMAL HIGH (ref 70–99)

## 2011-06-04 MED ORDER — SODIUM CHLORIDE 0.9 % IJ SOLN
INTRAMUSCULAR | Status: AC
  Administered 2011-06-04: 10 mL via INTRAVENOUS
  Filled 2011-06-04: qty 10

## 2011-06-04 MED ORDER — DOBUTAMINE IN D5W 4-5 MG/ML-% IV SOLN
INTRAVENOUS | Status: AC
  Administered 2011-06-04: 5 ug/kg/min via INTRAVENOUS
  Filled 2011-06-04: qty 250

## 2011-06-04 MED ORDER — TECHNETIUM TC 99M TETROFOSMIN IV KIT
30.0000 | PACK | Freq: Once | INTRAVENOUS | Status: AC | PRN
  Administered 2011-06-04: 28 via INTRAVENOUS

## 2011-06-04 MED ORDER — TECHNETIUM TC 99M TETROFOSMIN IV KIT
10.0000 | PACK | Freq: Once | INTRAVENOUS | Status: AC | PRN
  Administered 2011-06-04: 9 via INTRAVENOUS

## 2011-06-04 NOTE — Progress Notes (Signed)
Patient seen and examined. Breathing more comfortably. Followup echocardiogram reveals LVEF of 45-50% with inferoposterior hypokinesis. He has been managed for bronchitis and also possible acute on chronic combined heart failure symptoms.   On examination he is afebrile, blood pressure 129/57, heart rate 69 in sinus rhythm, out more than then by 1900 cc. Lungs exhibit diminished breath sounds throughout, cardiac exam with distant regular heart sounds, no murmur.  Blood work reviewed with potassium 3.8, BUN 21, creatinine 1.0.  Today he proceeds with a dobutamine Myoview for assessment of underlying ischemia in order to help guide further treatment.  Jonelle Sidle, M.D., F.A.C.C.

## 2011-06-04 NOTE — Progress Notes (Signed)
Did extensive teaching with patient, including printouts, regarding congestive heart failure, what it means and how to manage at home.  Patient stated he understood and will let us know if he has questions.  Stressed the importance of weighing at home and he will need scales before he leaves as this is new onset CHF and his scales are broken that he currently has.      Reece Packer, RN

## 2011-06-04 NOTE — Progress Notes (Signed)
Subjective:  No cardiac complaints. Underwent dobutamine Myoview today. Patient tolerated it well. Breathing easier.  Objective:  Vital Signs in the last 24 hours: Temp:  [97.6 F (36.4 C)-98.3 F (36.8 C)] 98 F (36.7 C) (04/10 0641) Pulse Rate:  [57-69] 69  (04/10 0641) Resp:  [20] 20  (04/10 0641) BP: (106-129)/(50-57) 129/57 mmHg (04/10 0641) SpO2:  [90 %-99 %] 95 % (04/10 0752) Weight:  [190 lb 7.6 oz (86.4 kg)-191 lb 2.2 oz (86.7 kg)] 191 lb 2.2 oz (86.7 kg) (04/10 0500)  Intake/Output from previous day: 04/09 0701 - 04/10 0700 In: 865 [P.O.:600; I.V.:3; IV Piggyback:262] Out: 2800 [Urine:2800] Intake/Output from this shift:    Physical Exam: NECK: Without JVD, HJR, or bruit LUNGS:Decreased breath sounds but Clear anterior, posterior, lateral HEART: Regular rate and rhythm, no murmur, gallop, rub, bruit, thrill, or heave EXTREMITIES: Without cyanosis, clubbing, or edema   Lab Results:  Basename 06/01/11 1145  WBC 4.1  HGB 10.0*  PLT 135*    Basename 06/04/11 0602 06/01/11 1145  NA 141 138  K 3.8 3.9  CL 101 103  CO2 30 26  GLUCOSE 139* 118*  BUN 21 18  CREATININE 1.06 1.13    Basename 06/02/11 0705 06/01/11 2308  TROPONINI 0.78* 0.80*   Hepatic Function Panel  Basename 06/01/11 1145  PROT 6.0  ALBUMIN 3.1*  AST 27  ALT 14  ALKPHOS 54  BILITOT 0.3  BILIDIR <0.1  IBILI NOT CALCULATED   No results found for this basename: CHOL in the last 72 hours No results found for this basename: PROTIME in the last 72 hours  Imaging:2Decho 03/18/11 - Left ventricle: Inferobasal and distal septal hypokinesis The cavity size was normal. Wall thickness was increased in a pattern of mild LVH. Systolic function was normal. The estimated ejection fraction was in the range of 50% to 55%. - Aortic valve: Moderately calcified annulus. Moderately calcified leaflets. There was mild stenosis.  2Decho 06/03/11: Study Conclusions  - Left ventricle: The cavity size  was normal. Moderate LVH   of the sepum and anterior walls. Inferior and posterior   myocardium is thinned and hypokinetic. Systolic function   was mildly reduced. The estimated ejection fraction was in   the range of 45% to 50%. Hypokinesis of the inferior and   posterior myocardium. - Aortic valve: Moderately calcified annulus. Mildly   thickened, mildly calcified leaflets. Trivial   regurgitation. Valve area: 2.21cm^2(VTI). Valve area:   1.79cm^2 (Vmax). - Mitral valve: Calcified annulus. - Right ventricle: The cavity size was normal. Wall   thickness was mildly increased.   Cardiac Studies:  Assessment/Plan:  #1 acute on chronic heart failure. Has diuresed over 2 L. Scheduled for dobutamine stress test today. Repeat 2-D echo EF 45-50% with hypokinesis in the inferior and posterior myocardium. #2 acute on chronic bronchitis treated with steroids antibiotics and bronchodilators.  LOS: 3 days    Jacolyn Reedy 06/04/2011, 8:54 AM

## 2011-06-04 NOTE — Progress Notes (Signed)
Stress Lab Nurses Notes - Howard [name] 06/04/2011  Reason for doing test: Dyspnea Type of test: Dobutamine Myoview Nurse performing test: Parke Poisson, RN Nuclear Medicine Tech: Lou Cal Echo Tech: Not Applicable MD performing test: Ival Bible & Jacolyn Reedy PA Family MD: [facility] Test explained and consent signed: yes IV started: IV in progress from floor and No redness or edema Symptoms: SOB Treatment/Intervention: None Reason test stopped: reached target HR After recovery IV was: Left intact (rate of 20), No redness or edema and Saline Lock flushed Patient to return to Nuc. Med at :13:15 Patient discharged: Transported back to room 329 via wc Patient's Condition upon discharge was: stable Comments: During test  BP 100/50 & HR 140. Recovery BP 110/52 & HR 84.  Symptoms resolved in recovery. Erskine Speed T

## 2011-06-04 NOTE — Progress Notes (Signed)
NAME:  Howard [name], Howard [name]                 ACCOUNT NO.:  [id number]  MEDICAL RECORD NO.:  [id number]  LOCATION:  A329                          FACILITY:  [facility]  PHYSICIAN:  Cassadi Purdie G. Renard Matter, MD   DATE OF BIRTH:  [birth year]  DATE OF PROCEDURE: DATE OF DISCHARGE:                                PROGRESS NOTE   The patient had a fairly comfortable night with less shortness of breath.  He continues to be treated for acute on chronic bronchitis, CHF with low ejection fraction of 45%, and  previous MI.  A 2-D echo showed no changes from previous echocardiogram, 40-45% ejection fraction.  The patient is apparently scheduled for stress test today.  He is being seen by Cardiology.  OBJECTIVE:  VITAL SIGNS:  Blood pressure 106/50, pulse 57, respirations 20, temperature 97.6.  LUNGS:  Diminished breath sounds. HEART:  Regular rhythm. ABDOMEN:  No palpable organs or masses.  No peripheral edema.  ASSESSMENT:  The patient continues to be treated for above-stated problems.  He continues to diurese.  His most recent chemistries were within normal range.  PLAN:  To proceed with stress testing today.  We will repeat chemistries.     Howard [name] G. Renard Matter, MD     AGM/MEDQ  D:  06/04/2011  T:  06/04/2011  Job:  [id number]

## 2011-06-04 NOTE — Plan of Care (Signed)
Problem: Consults Goal: General Medical Patient Education See Patient Education Module for specific education.  Outcome: Completed/Met Date Met:  06/04/11 Patient given information on CHF

## 2011-06-05 LAB — GLUCOSE, CAPILLARY
Glucose-Capillary: 121 mg/dL — ABNORMAL HIGH (ref 70–99)
Glucose-Capillary: 105 mg/dL — ABNORMAL HIGH (ref 70–99)

## 2011-06-05 LAB — BASIC METABOLIC PANEL
BUN: 26 mg/dL — ABNORMAL HIGH (ref 6–23)
Calcium: 8.7 mg/dL (ref 8.4–10.5)
Creatinine, Ser: 1.18 mg/dL (ref 0.50–1.35)
GFR calc Af Amer: 65 mL/min — ABNORMAL LOW (ref >90)
GFR calc non Af Amer: 56 mL/min — ABNORMAL LOW (ref >90)
Glucose, Bld: 109 mg/dL — ABNORMAL HIGH (ref 70–99)
Potassium: 3.5 mEq/L (ref 3.5–5.1)

## 2011-06-05 MED ORDER — MOXIFLOXACIN HCL 400 MG PO TABS
400.0000 mg | ORAL_TABLET | Freq: Every day | ORAL | Status: DC
  Administered 2011-06-05 – 2011-06-06 (×2): 400 mg via ORAL
  Filled 2011-06-05 (×2): qty 1

## 2011-06-05 MED ORDER — FUROSEMIDE 40 MG PO TABS
40.0000 mg | ORAL_TABLET | Freq: Two times a day (BID) | ORAL | Status: DC
  Administered 2011-06-05 – 2011-06-06 (×3): 40 mg via ORAL
  Filled 2011-06-05 (×3): qty 1

## 2011-06-05 NOTE — Progress Notes (Signed)
PHARMACIST - PHYSICIAN COMMUNICATION DR:   Mylo Red CONCERNING: Antibiotic IV to Oral Route Change Policy  RECOMMENDATION: This patient is receiving Avelox by the intravenous route.  Based on criteria approved by the Pharmacy and Therapeutics Committee, the antibiotic(s) is/are being converted to the equivalent oral dose form(s).   DESCRIPTION: These criteria include:  Patient being treated for a respiratory tract infection, urinary tract infection, or cellulitis  The patient is not neutropenic and does not exhibit a GI malabsorption state  The patient is eating (either orally or via tube) and/or has been taking other orally administered medications for a least 24 hours  The patient is improving clinically and has a Tmax < 100.5  If you have questions about this conversion, please contact the Pharmacy Department  [x]   [phone] )  [facility] []   [phone] )  [facility]  []   [phone] )  [facility] []   [phone] )  [facility]    S. Margo Aye, PharmD

## 2011-06-05 NOTE — Progress Notes (Signed)
NAME:  Howard [name], Howard [name]                 ACCOUNT NO.:  [id number]  MEDICAL RECORD NO.:  [id number]  LOCATION:  A329                          FACILITY:  [facility]  PHYSICIAN:  Laurin Morgenstern G. Renard Matter, MD   DATE OF BIRTH:  [birth year]  DATE OF PROCEDURE: DATE OF DISCHARGE:                                PROGRESS NOTE   This patient had a fairly comfortable night with less shortness of breath.  He has been treated for acute-on-chronic bronchitis, CHF with low ejection fraction, and previous MI.  He did have stress testing yesterday by Cardiology.  Dobutamine Myoview report still pending.  OBJECTIVE:  VITAL SIGNS:  Blood pressure 134/67, respirations 20, pulse 75, temp 98.2. LUNGS:  Diminished breath sounds. HEART:  Regular rhythm. ABDOMEN:  No palpable organs or masses. EXTREMITIES:  Free of edema.  ASSESSMENT:  The patient has acute-on-chronic bronchitis.  Does have coronary artery disease with previous myocardial infarction, diastolic congestive heart failure, and diabetes.  PLAN:  To continue IV antibiotics, Avelox 400 mg daily, Lasix 40 mg every 12 hours with nebulization.     Murial Beam G. Renard Matter, MD     AGM/MEDQ  D:  06/05/2011  T:  06/05/2011  Job:  [id number]

## 2011-06-06 DIAGNOSIS — I428 Other cardiomyopathies: Secondary | ICD-10-CM

## 2011-06-06 LAB — BASIC METABOLIC PANEL
BUN: 27 mg/dL — ABNORMAL HIGH (ref 6–23)
Calcium: 8.9 mg/dL (ref 8.4–10.5)
Creatinine, Ser: 1.12 mg/dL (ref 0.50–1.35)
GFR calc Af Amer: 70 mL/min — ABNORMAL LOW (ref >90)
GFR calc non Af Amer: 60 mL/min — ABNORMAL LOW (ref >90)

## 2011-06-06 LAB — GLUCOSE, CAPILLARY
Glucose-Capillary: 115 mg/dL — ABNORMAL HIGH (ref 70–99)
Glucose-Capillary: 140 mg/dL — ABNORMAL HIGH (ref 70–99)
Glucose-Capillary: 116 mg/dL — ABNORMAL HIGH (ref 70–99)

## 2011-06-06 NOTE — Progress Notes (Signed)
Pt discharged with instructions and prescriptions discussed with him and his son.  They both verbalized understanding and all question answered.  Pt left the floor via w/c in stable condition.

## 2011-06-06 NOTE — Progress Notes (Signed)
Pt evaluated for long term disease management services with [facility] Care Management program (formerly [facility]) as a benefit of [employer]. RN case manager will do a post d/c transition of care call and monthly home visits for assessments for CAD, DM, CHF and other needs after acute visits are completed by [facility].  Brooke Bonito C. Roena Malady, RN, MS, CCM [facility] Liaison/Assistant Clinical Director [facility] Management Program # [phone]

## 2011-06-06 NOTE — Progress Notes (Addendum)
SUBJECTIVE:Feels better. No complaints of chest pain breathing better.  Active Problems:  Chronic diastolic congestive heart failure  Bronchitis, chronic  Anemia   LABS: Basic Metabolic Panel:  Basename 06/06/11 0548 06/05/11 0526  NA 139 139  K 3.6 3.5  CL 104 103  CO2 28 27  GLUCOSE 113* 109*  BUN 27* 26*  CREATININE 1.12 1.18  CALCIUM 8.9 8.7  MG -- --  PHOS -- --    RADIOLOGY: Nm Myocar Single W/spect W/wall Motion And Ef  06/04/2011  Ordering Physician: Joni Reining  Reading Physician: Jonelle Sidle  Clinical Data: 78 year old male with history of bronchitis, acute on chronic combined congestive heart failure symptoms, and LVEF of 45% with evidence of previous inferior scar and underlying CAD.  He is referred for the assessment of ischemia.  NUCLEAR MEDICINE STRESS MYOVIEW STUDY WITH SPECT AND LEFT VENTRICULAR EJECTION FRACTION   IMPRESSION: Abnormal dobutamine Myoview as outlined.  The patient experienced no chest pain and was hemodynamically stable, however there were abnormal ST-segment changes as noted, and also frequent atrial and ventricular ectopy including a burst of NSVT. Artifact noted including arms down imaging with diaphragmatic attenuation. Perfusion defects are consistent with underlying CAD, apparent ischemic cardiomyopathy with inferolateral scar, and ischemia noted within the anteroseptal region as well as mid to basal portion of the lateral wall. LVEF calculated at 35% with more prominent hypokinesis in the anteroapical and inferoseptal distribution.  Original Report Authenticated By: Belinda Fisher     PHYSICAL EXAM BP 129/64  Pulse 74  Temp(Src) 97.2 F (36.2 C) (Oral)  Resp 20  Ht 5\' 8"  (1.727 m)  Wt 190 lb 7.6 oz (86.4 kg)  BMI 28.96 kg/m2  SpO2 95% General: Well developed, well nourished, in no acute distress Head: Eyes PERRLA,  positive xanthomas.   Normal cephalic and atramatic  Lungs: Clear bilaterally to auscultation and percussion. Heart:  HRRR S1 S2, 1/6 systolic murmur.  .  Pulses are 2+ & equal.            No carotid bruit. No JVD.  No abdominal bruits. No femoral bruits. Abdomen: Bowel sounds are positive, abdomen soft and non-tender without masses or                  Hernia's noted. Msk:  Back normal, normal gait. Normal strength and tone for age. Extremities: No clubbing, cyanosis or edema.  DP +1 Neuro: Alert and oriented X 3. Psych:  Good affect, responds appropriately  TELEMETRY: Reviewed telemetry pt in Sinus rhythm rate in the 70's.  ASSESSMENT AND PLAN:  1. Systolic Dysfunction: Ejection fraction was in the range of 45% to 50%. Hypokinesis of the inferior and  posterior myocardium.No evidence of fluid overload on exam today. His wt is stable. Continue PO lasix 40 mg BID, and lisinopril along with metoprolol. Can consider changing to coreg is systolic dysfunction.  2. CAD: Abnormal stress test with ischemia noted within the anteroseptal region as well as mid to basal portion of the lateral wall. LVEF calculated at 35%. Consideration for cardiac cath will be discussed with Dr. Dietrich Pates.   3. Bronchitis: Breathing status is stabilized. He is transitioned to po abx per Dr. Renard Matter.Bettey Mare. Lyman Bishop NP [facility] 06/06/2011, 10:44 AM   Cardiology Attending Patient interviewed and examined. Discussed with Joni Reining, NP.  Above note annotated and modified based upon my findings.  Pharmacologic stress nuclear study shows findings suggestive of ischemic etiology for patient's cardiomyopathy. Since there  was no acute coronary syndrome associated with this admission, patient can be discharged with plans for outpatient cardiology followup.  Continue treatment with aspirin, diuretic, ACE inhibitor, statin and beta blocker.  Dr. Diona Browner will determine whether or cardiac catheterization and consideration of revascularization is appropriate.  [place] Bing, MD 06/06/2011, 5:46 PM

## 2011-06-06 NOTE — Discharge Instructions (Signed)
[facility] for registered nurse and physical therapy. 

## 2011-06-06 NOTE — Progress Notes (Signed)
NAME:  Howard [name], Howard [name]                 ACCOUNT NO.:  [id number]  MEDICAL RECORD NO.:  [id number]  LOCATION:  A329                          FACILITY:  [facility]  PHYSICIAN:  Zorian Gunderman G. Renard Matter, MD   DATE OF BIRTH:  17-Jun-1929  DATE OF PROCEDURE: DATE OF DISCHARGE:                                PROGRESS NOTE   This patient remains fairly comfortable with less of shortness of breath.  He has acute-on-chronic bronchitis, CHF with low ejection fraction and previous MI.  He had stress testing. Dobutamine Myoview report is pending.  OBJECTIVE:  VITAL SIGNS:  Blood pressure of 129/64, respirations 20, pulse 74, temp 97.2. LUNGS:  Diminished breath sounds. HEART:  Regular rhythm. ABDOMEN:  Soft.  No palpable organs or masses. EXTREMITIES:  Free of edema.  ASSESSMENT:  The patient has acute-on-chronic bronchitis.  Does have coronary artery disease with previous myocardial infarction, diastolic congestive heart failure and diabetes.  PLAN:  To continue current IV antibiotics, IV Lasix every 12 hours, and nebulization.     [name], MD     AGM/MEDQ  D:  06/06/2011  T:  06/06/2011  Job:  [id number]

## 2011-06-06 NOTE — Progress Notes (Signed)
   CARE MANAGEMENT NOTE 06/06/2011  Patient:  Howard [name], Howard [name]   Account Number:  [id number]  Date Initiated:  06/02/2011  Documentation initiated by:  Sharrie Rothman  Subjective/Objective Assessment:   Pt admitted from home with son with CHF. Pt walks with cane but requires some assistance with ADL's.     Action/Plan:   Pt would benefit from [facility] RN and PT at discharge. PT consult ordered inhouse.   Anticipated DC Date:  06/08/2011   Anticipated DC Plan:  HOME W HOME HEALTH SERVICES      DC Planning Services  CM consult      [facility] Choice  HOME HEALTH   Choice offered to / List presented to:  C-1 Patient        HH arranged  HH-1 RN  HH-2 PT      [facility] agency  [facility]   Status of service:  Completed, signed off Medicare Important Message given?   (If response is "NO", the following Medicare IM given date fields will be blank) Date Medicare IM given:   Date Additional Medicare IM given:    Discharge Disposition:  HOME W HOME HEALTH SERVICES  Per UR Regulation:    If discussed at Long Length of Stay Meetings, dates discussed:    Comments:  06/06/11 1516 Arlyss Queen, RN BSN CM Pt possible discharge 06/06/11. Orders for home health received and carried out. Pt chose [facility] for RN and PT. Alroy Bailiff of [facility] is aware and will collect information from the chart. NO DME needs at this time. Pt room air sats 95%. Pt and pts nurse is aware of discharge arrangements.  06/02/11 1552 Arlyss Queen, RN BSN CM

## 2011-06-06 NOTE — Discharge Summary (Signed)
NAME:  Howard, [name]                 ACCOUNT NO.:  [id number]  MEDICAL RECORD NO.:  [id number]  LOCATION:  A329                          FACILITY:  [facility]  PHYSICIAN:  Garett Tetzloff G. Renard Matter, MD   DATE OF BIRTH:  12-Jan-1930  DATE OF ADMISSION:  06/01/2011 DATE OF DISCHARGE:  04/12/2013LH                              DISCHARGE SUMMARY   DIAGNOSES:  Acute-on-chronic bronchitis, coronary artery disease, congestive heart failure with ischemic cardiomyopathy, ejection fraction 35%, non-insulin-dependent diabetes, hypertension, degenerative joint disease of the knees, history of myasthenia gravis.  The patient's condition is stable and improved at the time of his discharge.  This 78 year old came to the emergency room with increasing shortness of breath.  He had been seen previously in the office and treated for upper respiratory type symptoms.  He came into the ED, had to sit up to sleep and then coughing nonproductively.  He had not had any chest pain or fever.  He was thought to be in congestive heart failure as well as he had acute bronchitis and prior history of myasthenia gravis.  A chest x- ray on admission showed stable cardiomegaly and mediastinal lipomatosis, but no acute findings.  The patient was admitted to med/surg bed.  PHYSICAL EXAMINATION:  GENERAL:  On admission, the patient was alert and in mild respiratory distress. VITAL SIGNS:  His blood pressure 144/57, pulse 65, temperature 98.7, respirations 23. HEENT:  Negative. NECK:  Supple.  No JVD or thyroid abnormalities.  No evidence of definite JVD in upright position. LUNGS:  Diminished breath sounds, rales at bases. HEART:  Regular rhythm.  No murmurs.  No obvious cardiomegaly.  ABDOMEN: Soft.  No palpable organs or masses. EXTREMITIES:  The patient has trace edema in lower extremities.  LABORATORY DATA:  On admission, CBC, WBC 4100 with hemoglobin 10.0, hematocrit 30.2.  Chemistries on admission, sodium 138, potassium  3.9, chloride 103, CO2 of 26, glucose 118, BUN 18, creatinine 1.13, calcium 8.9.  CPK-MB on admission, 3.6, CK total 217, troponin 0.78.  ProBNP 1473.  Subsequent chemistries on June 06, 2011, sodium 116, potassium 3.6, chloride 104, CO2 of 28, BUN 27, creatinine 1.12, calcium 8.9, glucose 113, alkaline phosphatase 54, albumin 3.1, AST 27, ALT 14, total protein 6.0, bilirubin less than 0.1.  X-RAYS:  Chest x-ray on admission stable, cardiomegaly with mediastinal lipomatosis.  No acute findings.  Report on dobutamine Myoview, abnormal dobutamine Myoview, abnormal ST-segment changes.  Frequent atrial and ventricular ectopy.  Perfusion defects consistent with underlying coronary artery disease, apparent ischemic cardiomyopathy with inferolateral scar and ischemia noted within the anterior septal region as well as admitted to basal portion of the lateral wall.  LVEF calculated at 35% with more prominent hypokinesis in the anterior apical and inferior septal distribution.  HOSPITAL COURSE:  The patient on admission was placed on the following medications.  Aspirin 325 mg daily, docusate sodium 100 mg b.i.d., enoxaparin 40 mg subcu every 24 hours, Lasix 40 mg b.i.d., Mucinex 1200 mg b.i.d., insulin aspart 0-5 units subcutaneously at bedtime, and insulin 2 units t.i.d., levalbuterol 0.63 mg nebulization every 6 hours, lisinopril 10 mg daily, metoprolol 25 mg daily, pantoprazole 40 mg daily, simvastatin 40  mg daily, pyridostigmine 60 mg every 6 hours, omega-3 acid ethyl esters 1 g b.i.d.  The patient is also placed on Avelox 1 mg daily.  The patient also had an IV started on normal saline KVO rate, and throughout hospitalization he had p.r.n. medications, acetaminophen 650 mg every 6 hours p.r.n., guanfacine 5 mL every 4 hours p.r.n., hydrocodone/acetaminophen 2 tablets every 4 hours p.r.n., lorazepam 0.5 mg p.r.n. and on this regimen, the patient gradually improved throughout his hospital  stay.  He was seen in consultation by Cardiology, had arrangements for him to have dobutamine Myoview stress test what was done.  Stress test was completed towards the latter part of the hospitalization and was abnormal.  During the stress test, the patient experienced no chest pain, was hemodynamically stable.  However, there were abnormal ST-segment changes, frequent atrial and ventricular ectopy during the NSVT.  Profusion defects were consistent with underlying coronary artery disease, apparent ischemic cardiomyopathy, and inferolateral scar with ischemia noted within the anterior septal region as well as mid to basal portion of the lateral wall.  LVEF calculated at 35% with more prominent hypokinesis and anterior apical and inferior septal distribution.  Cardiology felt the patient was stable enough to be discharged after 5 days hospitalization.  During this period of time, he diuresed.  His blood sugars were monitored and remained in a fairly decent range.  He was given insulin as needed periodically and continued on Avelox.  His cough and congestion improved.  Towards latter part of hospital stay, he had minimal edema. He remained on nasal O2.  Towards the latter part of hospitalization, he was able to ambulate in the room and in the hall.  He did have a Foley catheter, and this was removed at the time of discharge.  Cardiology recommended that he follow up with them as an outpatient.  It is a possibility he might need cardiac catheterization.  The patient was discharged on the following medications: 1. Proair HFA as needed. 2. Aspirin 81 mg daily. 3. Cinnamon 500 mg daily. 4. Fish oil. 5. Omega-3 fatty acids 1000 mg b.i.d. 6. Furosemide 20 mg b.i.d. 7. Hycodan syrup a teaspoon q.4 h. p.r.n. 8. Levaquin 500 mg daily. 9. Lisinopril 20 mg daily. 10.Lorazepam 0.5 mg every 4 hours p.r.n. 11.Metformin 500 mg daily. 12.Metoprolol 50 mg b.i.d. 13.Pantoprazole 40 mg  daily. 14.Potassium chloride 20 mEq daily. 15.Mestinon 60 mg b.i.d. 16.Simvastatin 40 mg daily.  The patient was stable and improved at the time of his discharge.     Amaranta Mehl G. Renard Matter, MD     AGM/MEDQ  D:  06/06/2011  T:  06/06/2011  Job:  [id number]

## 2011-06-10 NOTE — Progress Notes (Signed)
Discharge summary sent to payer through [facility]  

## 2011-06-24 ENCOUNTER — Telehealth: Payer: Self-pay

## 2011-06-24 NOTE — Telephone Encounter (Signed)
Pt called- he stated he was having an episode of diarrhea every 2-3 days. Wanted to know if there was something we could call in for him to make it stop until his ov on 07/04/11. Per RMR last ov note, pt should have been taking [place]. Asked pt if he was still taking it and did it help when he was taking it. Pt said it was helping when he was taking it and he hasn't taken it in awhile. Advised pt to try it again, went over the fact that he could not take it within 2 hours of his other medications multiple times, so pt is aware when he cannot take it. Pt said he would try it and call if it doesn't help.

## 2011-06-24 NOTE — Discharge Summary (Signed)
NAME:  Howard [name], Howard [name]                 ACCOUNT NO.:  [id number]  MEDICAL RECORD NO.:  [id number]  LOCATION:  A329                          FACILITY:  [facility]  PHYSICIAN:  Uriel Horkey G. Renard Matter, MD   DATE OF BIRTH:  10/21/1929  DATE OF ADMISSION:  06/01/2011 DATE OF DISCHARGE:  04/12/2013LH                              DISCHARGE SUMMARY   ADDENDUM  The patient was given Levaquin, which was a hand written prescription given to the patient at the time of his discharge.  This was to be filled at [facility].  The patient was instructed to continue this medication until it was discontinued as an outpatient.     Sandee Bernath G. Renard Matter, MD     AGM/MEDQ  D:  06/24/2011  T:  06/24/2011  Job:  [id number]

## 2011-07-04 ENCOUNTER — Encounter: Payer: Self-pay | Admitting: Internal Medicine

## 2011-07-04 ENCOUNTER — Ambulatory Visit (INDEPENDENT_AMBULATORY_CARE_PROVIDER_SITE_OTHER): Payer: Medicare Other | Admitting: Internal Medicine

## 2011-07-04 VITALS — BP 135/53 | HR 64 | Temp 97.4°F | Ht 69.0 in | Wt 191.0 lb

## 2011-07-04 DIAGNOSIS — K589 Irritable bowel syndrome without diarrhea: Secondary | ICD-10-CM

## 2011-07-04 DIAGNOSIS — R197 Diarrhea, unspecified: Secondary | ICD-10-CM

## 2011-07-04 DIAGNOSIS — K5289 Other specified noninfective gastroenteritis and colitis: Secondary | ICD-10-CM

## 2011-07-04 DIAGNOSIS — K52839 Microscopic colitis, unspecified: Secondary | ICD-10-CM

## 2011-07-04 DIAGNOSIS — K580 Irritable bowel syndrome with diarrhea: Secondary | ICD-10-CM

## 2011-07-04 NOTE — Patient Instructions (Addendum)
Increase Questran to 1 scoop daily  Office visit in 4 months

## 2011-07-04 NOTE — Assessment & Plan Note (Addendum)
Patient has a history of microscopic colitis and IBS-D.  Colitis symptoms felt to have settled down. We are now dealing more or less with an element of IBS-D. He has responded to low dose off label Questran. He is tolerating it very well. I believe we can bring him up to 4 g daily and get a better response. History of adenocarcinoma of the colon status post right hemicolectomy; due for surveillance colonoscopy 2016. Recommendations: Increase cholestyramine to one scoop or 4 g orally daily. Patient is admonished not to take this medication within 2 hours of any of his other medications. Plan to see him back in 4 months and when necessary. If this is not satisfactory in further improving his bowel symptoms, he is to let me know in the interim.

## 2011-07-04 NOTE — Progress Notes (Signed)
Primary Care Physician:  [name], MD, MD Primary Gastroenterologist:  Dr.   Pre-Procedure History & Physical: HPI:  Howard [name] is a 78 y.o. male here for followup of microscopic colitis and IBS-D. Patient tells me his recent hospitalized with acute myocardial infarction [place]. He was hospitalized for a  Week.  Taking one half a scoop of Questran daily. He had stopped it and called in recently; we recommended him going back on it because of some diarrhea. Has 1 to 3-4 formed to semi-formed bowel movements daily. Occasionally has urgency to get to the bathroom. No accidents. Previously had significant diarrhea felt to be due to microscopic colitis. He responded nicely to a course of Entocort. Last colonoscopy in 2011. History of colon cancer. He will be due for surveillance colonoscopy in 2016.Marland Kitchen Dysphagia improved since he underwent dilation of Schatzki's ring last year.  Past Medical History  Diagnosis Date  . Diabetes mellitus     type II  . Hypertension   . Pneumonia 2007  . Hyperlipidemia   . Adenocarcinoma 2003    COLON  . PVD (peripheral vascular disease)     ABI 0.78 RIGHT AND 0.80 LEFT 4/11  . Osteoarthritis     OF KNEES  . Kidney stone   . Schatzki's ring 08/05/10    egd with Dr. Jena Gauss  . Hiatal hernia 08/05/10    egd with Dr. Jena Gauss  . Esophageal dysmotility   . Microscopic colitis   . Internal hemorrhoids   . Diverticula of colon   . CHF (congestive heart failure)     Past Surgical History  Procedure Date  . Hemicolectomy 2003  . Appendectomy   . Colonoscopy 04/2001    Dr. Karilyn Cota- colon carcinoma  . Esophagogastroduodenoscopy 04/2001    Dr. Karilyn Cota  . Esophagogastroduodenoscopy 08/05/10    schatzki ring, hiatal hernia  . Colonoscopy 09/04/09    diverticula, hemorrhoids    Prior to Admission medications   Medication Sig Start Date End Date Taking? Authorizing Provider  albuterol (PROAIR HFA) 108 (90 BASE) MCG/ACT inhaler Inhale 2 puffs into the lungs  every 6 (six) hours as needed.     Yes Historical Provider, MD  aspirin EC 81 MG tablet Take 81 mg by mouth daily.   Yes Historical Provider, MD  Cinnamon 500 MG capsule Take 1,000 mg by mouth daily.   Yes Historical Provider, MD  fish oil-omega-3 fatty acids 1000 MG capsule Take 1 g by mouth 2 (two) times daily.   Yes Historical Provider, MD  furosemide (LASIX) 20 MG tablet Take 20 mg by mouth 2 (two) times daily.     Yes Historical Provider, MD  HYDROcodone-homatropine (HYCODAN) 5-1.5 MG/5ML syrup Take 5 mLs by mouth every 4 (four) hours as needed. For cough   Yes Historical Provider, MD  lisinopril (PRINIVIL,ZESTRIL) 20 MG tablet Take 20 mg by mouth daily.   Yes Historical Provider, MD  loperamide (IMODIUM) 2 MG capsule Take 2 mg by mouth as needed.  05/19/11  Yes Historical Provider, MD  LORazepam (ATIVAN) 0.5 MG tablet Take 0.5 mg by mouth every 4 (four) hours as needed. For anxiety   Yes Historical Provider, MD  metFORMIN (GLUCOPHAGE) 500 MG tablet Take 500 mg by mouth daily with breakfast.   Yes Historical Provider, MD  metoprolol (LOPRESSOR) 50 MG tablet Take 25 mg by mouth 2 (two) times daily.   Yes Historical Provider, MD  pantoprazole (PROTONIX) 40 MG tablet Take 40 mg by mouth daily.   Yes Historical  Provider, MD  potassium chloride SA (K-DUR,KLOR-CON) 20 MEQ tablet Take 20 mEq by mouth daily.     Yes Historical Provider, MD  pyridostigmine (MESTINON) 60 MG tablet Take 60 mg by mouth 2 (two) times daily.   Yes Historical Provider, MD  simvastatin (ZOCOR) 40 MG tablet Take 40 mg by mouth every evening.   Yes Historical Provider, MD    Allergies as of 07/04/2011  . (No Known Allergies)    Family History  Problem Relation Age of Onset  . Coronary artery disease Father   . Coronary artery disease Brother     CABG  . Coronary artery disease Brother     History   Social History  . Marital Status: Married    Spouse Name: N/A    Number of Children: N/A  . Years of Education: N/A    Occupational History  . Not on file.   Social History Main Topics  . Smoking status: Former Games developer  . Smokeless tobacco: Never Used   Comment: Quit x 20 years  . Alcohol Use: No  . Drug Use: No  . Sexually Active: Not on file   Other Topics Concern  . Not on file   Social History Narrative   Lives in [place]. Married. Wife in [place].Son lives with him    Review of Systems: See HPI, otherwise negative ROS  Physical Exam: BP 135/53  Pulse 64  Temp(Src) 97.4 F (36.3 C) (Temporal)  Ht 5\' 9"  (1.753 m)  Wt 191 lb (86.637 kg)  BMI 28.21 kg/m2 General:   Alert,  Well-developed, well-nourished, pleasant and cooperative in NAD Skin:  Intact without significant lesions or rashes. Eyes:  Sclera clear, no icterus.   Conjunctiva pink. Neck:  Supple; no masses or thyromegaly. No significant cervical adenopathy. Abdomen: Non-distended, normal bowel sounds.  Soft and nontender without appreciable mass or hepatosplenomegaly.  Pulses:  Normal pulses noted. Extremities:  Without clubbing or edema.

## 2011-07-07 ENCOUNTER — Encounter: Payer: Self-pay | Admitting: Adult Health

## 2011-07-07 ENCOUNTER — Ambulatory Visit (INDEPENDENT_AMBULATORY_CARE_PROVIDER_SITE_OTHER): Payer: Medicare Other | Admitting: Adult Health

## 2011-07-07 VITALS — BP 164/63 | HR 67 | Resp 18 | Ht 68.0 in | Wt 188.0 lb

## 2011-07-07 DIAGNOSIS — R9430 Abnormal result of cardiovascular function study, unspecified: Secondary | ICD-10-CM

## 2011-07-07 DIAGNOSIS — I428 Other cardiomyopathies: Secondary | ICD-10-CM

## 2011-07-07 DIAGNOSIS — I2510 Atherosclerotic heart disease of native coronary artery without angina pectoris: Secondary | ICD-10-CM

## 2011-07-07 DIAGNOSIS — I10 Essential (primary) hypertension: Secondary | ICD-10-CM

## 2011-07-07 DIAGNOSIS — Z7901 Long term (current) use of anticoagulants: Secondary | ICD-10-CM

## 2011-07-07 MED ORDER — CARVEDILOL 6.25 MG PO TABS: 6.2500 mg | ORAL_TABLET | Freq: Two times a day (BID) | ORAL | Status: DC

## 2011-07-07 NOTE — Assessment & Plan Note (Signed)
I have discussed the results of his stress test and echocardiogram with [name]. I have also gone over this case with Dr.Wall on site. It is determined that he will benefit from diagnostic cardiac cath for further evaluation of CAD with possible intervention. I have changed his metoprolol to Coreg 6.25mg  BID in the setting of significant systolic dysfunction of 35%. Howard [name] He will continue ACE inhibitor and statin. He is advised of cardiac cath procedure along with risks and benefits to include, CVA, MI and death. He verbalizes understanding and is willing to proceed. A phone call is made to his son to explain the need to proceed with cardiac cath. He is scheduled for Wed, May 15,2013 with Dr. Gala Romney.

## 2011-07-07 NOTE — Progress Notes (Signed)
 HPI: [name] is a pleasant 78 y/o patient of Dr. Wall we are seeing on hospital follow-up after admission for systolic CHF, ischemic cardiomyopathy, hypertension. He also has a history of NIDDM, DJD, and myasthenia gravis. During hospitalization echocardiogram demonstrated significant deterioration of EF from 50% in 02/2011 to 35% with prominent hypokinesis of the anterior, apical, and inferior septal distribution. He was found to have mild AoV stenosis with valve area of 1.23, dobutamine stress test demonstrated inferolateral scare and ischemia within the anterior septal region and basal portion of the lateral wall. He was appropriately diuresed and placed on home diuretics and ACE inhibitor.    Since discharge he has continued to have mild DOE, and intermittent chest pain. He has home health nurses who set up his medications at home for the week. He is medically compliant. His son lives with him and takes him to appointments. He denies LEE or dizziness.   No Known Allergies  Current Outpatient Prescriptions  Medication Sig Dispense Refill  . albuterol (PROAIR HFA) 108 (90 BASE) MCG/ACT inhaler Inhale 2 puffs into the lungs every 6 (six) hours as needed.        . aspirin EC 81 MG tablet Take 81 mg by mouth daily.      . CHOLESTYRAMINE PO Take by mouth daily.      . Cinnamon 500 MG capsule Take 1,000 mg by mouth daily.      . fish oil-omega-3 fatty acids 1000 MG capsule Take 1 g by mouth daily.       . furosemide (LASIX) 20 MG tablet Take 20 mg by mouth 2 (two) times daily.        . HYDROcodone-homatropine (HYCODAN) 5-1.5 MG/5ML syrup Take 5 mLs by mouth every 4 (four) hours as needed. For cough       . lisinopril (PRINIVIL,ZESTRIL) 20 MG tablet Take 20 mg by mouth daily.      . loperamide (IMODIUM) 2 MG capsule Take 2 mg by mouth as needed.       . LORazepam (ATIVAN) 0.5 MG tablet Take 0.5 mg by mouth every 4 (four) hours as needed. For anxiety       . metFORMIN (GLUCOPHAGE) 500 MG tablet  Take 500 mg by mouth daily with breakfast.      . pantoprazole (PROTONIX) 40 MG tablet Take 40 mg by mouth daily.      . potassium chloride SA (K-DUR,KLOR-CON) 20 MEQ tablet Take 20 mEq by mouth daily.        . pyridostigmine (MESTINON) 60 MG tablet Take 60 mg by mouth 2 (two) times daily.      . simvastatin (ZOCOR) 40 MG tablet Take 40 mg by mouth every evening.      . carvedilol (COREG) 6.25 MG tablet Take 1 tablet (6.25 mg total) by mouth 2 (two) times daily.  180 tablet  3    Past Medical History  Diagnosis Date  . Diabetes mellitus     type II  . Hypertension   . Pneumonia 2007  . Hyperlipidemia   . Adenocarcinoma 2003    COLON  . PVD (peripheral vascular disease)     ABI 0.78 RIGHT AND 0.80 LEFT 4/11  . Osteoarthritis     OF KNEES  . Kidney stone   . Schatzki's ring 08/05/10    egd with Dr. Rourk  . Hiatal hernia 08/05/10    egd with Dr. Rourk  . Esophageal dysmotility   . Microscopic colitis   . Internal   hemorrhoids   . Diverticula of colon   . CHF (congestive heart failure)     Past Surgical History  Procedure Date  . Hemicolectomy 2003  . Appendectomy   . Colonoscopy 04/2001    Dr. Rehman- colon carcinoma  . Esophagogastroduodenoscopy 04/2001    Dr. Rehman  . Esophagogastroduodenoscopy 08/05/10    schatzki ring, hiatal hernia  . Colonoscopy 09/04/09    diverticula, hemorrhoids    Family History  Problem Relation Age of Onset  . Coronary artery disease Father   . Coronary artery disease Brother     CABG  . Coronary artery disease Brother     History   Social History  . Marital Status: Married    Spouse Name: N/A    Number of Children: N/A  . Years of Education: N/A   Occupational History  . Not on file.   Social History Main Topics  . Smoking status: Former Smoker  . Smokeless tobacco: Never Used   Comment: Quit x 20 years  . Alcohol Use: No  . Drug Use: No  . Sexually Active: Not on file   Other Topics Concern  . Not on file   Social  History Narrative   Lives in [place]. Married. Wife in [place].Son lives with him    ROS:Review of systems complete and found to be negative unless listed above   PHYSICAL EXAM BP 164/63  Pulse 67  Resp 18  Ht 5' 8" (1.727 m)  Wt 188 lb (85.276 kg)  BMI 28.59 kg/m2  General: Well developed, well nourished, in no acute distress Head: Left eyelid drooping, Right eye normal, No xanthomas.   Normal cephalic and atramatic  Lungs: Clear bilaterally to auscultation and percussion with diminished breath sounds in the bases.. Heart: HRRR S1 S2,1/2 systolic murmur, .  Pulses are 2+ & equal.            No carotid bruit. No JVD.  No abdominal bruits. No femoral bruits. Abdomen: Bowel sounds are positive, abdomen soft and non-tender without masses or  Hernia's noted. Msk:  Back normal, slow  Gait, uses cane for ambulation.Diminsihed strength and tone for age. Extremities: No clubbing, cyanosis or edema.  DP +1 Neuro: Alert and oriented X 3.Hard of hearing Psych:  Good affect, responds appropriately   ASSESSMENT AND PLAN   

## 2011-07-07 NOTE — Patient Instructions (Addendum)
**Note De-Identified  Obfuscation** Your physician has requested that you have a cardiac catheterization. Cardiac catheterization is used to diagnose and/or treat various heart conditions. Doctors may recommend this procedure for a number of different reasons. The most common reason is to evaluate chest pain. Chest pain can be a symptom of coronary artery disease (CAD), and cardiac catheterization can show whether plaque is narrowing or blocking your heart's arteries. This procedure is also used to evaluate the valves, as well as measure the blood flow and oxygen levels in different parts of your heart. For further information please visit [email or web address] Please follow instruction sheet, as given.  Your physician has recommended you make the following change in your medication: stop taking Lisinopril and start taking Doxazosin 2 mg daily   Your physician recommends that you return for lab work in: today  Your physician recommends that you schedule a follow-up appointment in: June 5

## 2011-07-08 LAB — BASIC METABOLIC PANEL
BUN: 16 mg/dL (ref 6–23)
Chloride: 104 mEq/L (ref 96–112)
Creat: 0.85 mg/dL (ref 0.50–1.35)

## 2011-07-08 LAB — CBC WITH DIFFERENTIAL/PLATELET
Basophils Absolute: 0 10*3/uL (ref 0.0–0.1)
Basophils Relative: 0 % (ref 0–1)
Eosinophils Absolute: 0.2 10*3/uL (ref 0.0–0.7)
Lymphs Abs: 1.6 10*3/uL (ref 0.7–4.0)
MCH: 30 pg (ref 26.0–34.0)
Neutrophils Relative %: 68 % (ref 43–77)
Platelets: 210 10*3/uL (ref 150–400)
RBC: 3.47 MIL/uL — ABNORMAL LOW (ref 4.22–5.81)

## 2011-07-08 LAB — PROTIME-INR: Prothrombin Time: 13.3 seconds (ref 11.6–15.2)

## 2011-07-09 ENCOUNTER — Encounter (HOSPITAL_BASED_OUTPATIENT_CLINIC_OR_DEPARTMENT_OTHER)
Admission: RE | Disposition: A | Discharge status: Home / Self Care | Payer: Self-pay | Source: Ambulatory Visit | Attending: Cardiovascular Disease

## 2011-07-09 ENCOUNTER — Inpatient Hospital Stay (HOSPITAL_BASED_OUTPATIENT_CLINIC_OR_DEPARTMENT_OTHER)
Admission: RE | Admit: 2011-07-09 | Discharge: 2011-07-09 | Disposition: A | Discharge status: Home / Self Care | Payer: Medicare Other | Source: Ambulatory Visit | Attending: Cardiovascular Disease | Admitting: Cardiovascular Disease

## 2011-07-09 DIAGNOSIS — I10 Essential (primary) hypertension: Secondary | ICD-10-CM | POA: Insufficient documentation

## 2011-07-09 DIAGNOSIS — I2589 Other forms of chronic ischemic heart disease: Secondary | ICD-10-CM | POA: Insufficient documentation

## 2011-07-09 DIAGNOSIS — E119 Type 2 diabetes mellitus without complications: Secondary | ICD-10-CM | POA: Insufficient documentation

## 2011-07-09 DIAGNOSIS — Z7901 Long term (current) use of anticoagulants: Secondary | ICD-10-CM

## 2011-07-09 DIAGNOSIS — I2510 Atherosclerotic heart disease of native coronary artery without angina pectoris: Secondary | ICD-10-CM | POA: Insufficient documentation

## 2011-07-09 DIAGNOSIS — I5022 Chronic systolic (congestive) heart failure: Secondary | ICD-10-CM | POA: Insufficient documentation

## 2011-07-09 DIAGNOSIS — I509 Heart failure, unspecified: Secondary | ICD-10-CM | POA: Insufficient documentation

## 2011-07-09 SURGERY — JV LEFT HEART CATHETERIZATION WITH CORONARY ANGIOGRAM
Anesthesia: Moderate Sedation

## 2011-07-09 MED ORDER — SODIUM CHLORIDE 0.9 % IJ SOLN: 3.0000 mL | INTRAMUSCULAR | Status: DC | PRN

## 2011-07-09 MED ORDER — SODIUM CHLORIDE 0.9 % IV SOLN: INTRAVENOUS | Status: AC

## 2011-07-09 MED ORDER — ACETAMINOPHEN 325 MG PO TABS: 650.0000 mg | ORAL_TABLET | ORAL | Status: DC | PRN

## 2011-07-09 MED ORDER — DIAZEPAM 5 MG PO TABS
5.0000 mg | ORAL_TABLET | ORAL | Status: AC
  Administered 2011-07-09: 5 mg via ORAL

## 2011-07-09 MED ORDER — ONDANSETRON HCL 4 MG/2ML IJ SOLN: 4.0000 mg | Freq: Four times a day (QID) | INTRAMUSCULAR | Status: DC | PRN

## 2011-07-09 MED ORDER — SODIUM CHLORIDE 0.9 % IJ SOLN: 3.0000 mL | Freq: Two times a day (BID) | INTRAMUSCULAR | Status: DC

## 2011-07-09 MED ORDER — CLOPIDOGREL BISULFATE 300 MG PO TABS
600.0000 mg | ORAL_TABLET | Freq: Once | ORAL | Status: AC
  Administered 2011-07-09: 600 mg via ORAL

## 2011-07-09 MED ORDER — ASPIRIN 81 MG PO CHEW
324.0000 mg | CHEWABLE_TABLET | ORAL | Status: AC
  Administered 2011-07-09: 324 mg via ORAL

## 2011-07-09 MED ORDER — SODIUM CHLORIDE 0.9 % IV SOLN: INTRAVENOUS | Status: DC

## 2011-07-09 MED ORDER — SODIUM CHLORIDE 0.9 % IV SOLN
250.0000 mL | INTRAVENOUS | Status: DC | PRN
Start: 2011-07-09 — End: 2011-07-09

## 2011-07-09 NOTE — CV Procedure (Signed)
   Cardiac Catheterization Operative Report  JAJA SWITALSKI [id number] 5/15/20138:30 AM [name], MD, MD  Procedure Performed:  1. Left Heart Catheterization 2. Selective Coronary Angiography 3. Measurement of left ventricular pressures  Operator: Verne Carrow, MD  Indication: New cardiomyopathy, stress test with anterior and apical ischemia. Chest pain.                                       Procedure Details: The risks, benefits, complications, treatment options, and expected outcomes were discussed with the patient. The patient and/or family concurred with the proposed plan, giving informed consent. The patient was brought to the outpatient cath lab after IV hydration was begun and oral premedication was given. The patient was further sedated with Valium. The right groin was prepped and draped in the usual manner. Using the modified Seldinger access technique, a 4 French sheath was placed in the right femoral artery. I had trouble engaging the left main so I upsized to a 5 [place] long sheath. A 3DRC was used to engage the RCA. A JL5 was used to engage the left main artery.  A pigtail catheter was used to measure left ventricular pressures.   There were no immediate complications. The patient was taken to the recovery area in stable condition.   Hemodynamic Findings: Central aortic pressure: 164/58 Left ventricular pressure: 164/16/19  Angiographic Findings:  Left main:  Ostial 20% stenosis.   Left Anterior Descending Artery: Large caliber vessel that courses the apex. The proximal and mid segment have mild disease. The first diagonal is very small in caliber and has a 95% stenosis. This is too small for PCI. The second diagonal branch is small in caliber and has mild plaque disease.   Circumflex Artery: Moderate sized vessel with eccentric, calcified 60% stenosis in the proximal segment.   Right Coronary Artery: Large dominant vessel with diffuse calcification  throughout the proximal and mid segment. The mid vessel has a 99% stenosis and is heavily calcified. The distal vessel has serial 50% stenoses. The PLA is moderate sized and has ostial 50% stenosis. The PDA is moderate sized with mild plaque disease.   Left Ventricular Angiogram: Not performed.   Impression: 1. Severe single vessel CAD with heavily calcified stenosis in the mid RCA 2. Ischemic cardiomyopathy  Recommendations: I will plan PCI of the mid RCA lesion in a staged fashion next week. I will need to use a rotablator atherectomy to pretreat this lesion. I will arrange for Thursday Jul 17, 2011 at 10:30 am in the main cath lab. Will load with Plavix today. Continue ASA. I will manage the other moderate lesions medically at this time.        Complications:  None. The patient tolerated the procedure well.

## 2011-07-09 NOTE — Interval H&P Note (Signed)
History and Physical Interval Note:  07/09/2011 8:19 AM  Howard [name]  has presented today for surgery, with the diagnosis of cp  The various methods of treatment have been discussed with the patient and family. After consideration of risks, benefits and other options for treatment, the patient has consented to  Procedure(s) (LRB): JV LEFT HEART CATHETERIZATION WITH CORONARY ANGIOGRAM (N/A) as a surgical intervention .  The patients' history has been reviewed, patient examined, no change in status, stable for surgery.  I have reviewed the patients' chart and labs.  Questions were answered to the patient's satisfaction.     Howard [name]

## 2011-07-09 NOTE — OR Nursing (Signed)
Site level 1, pressure held for additional 15 mins.

## 2011-07-09 NOTE — Progress Notes (Signed)
Bedrest begins again @ 1000.

## 2011-07-09 NOTE — H&P (View-Only) (Signed)
HPI: Mr. Fritze is a pleasant 78 y/o patient of Dr. Daleen Squibb we are seeing on hospital follow-up after admission for systolic CHF, ischemic cardiomyopathy, hypertension. He also has a history of NIDDM, DJD, and myasthenia gravis. During hospitalization echocardiogram demonstrated significant deterioration of EF from 50% in 02/2011 to 35% with prominent hypokinesis of the anterior, apical, and inferior septal distribution. He was found to have mild AoV stenosis with valve area of 1.23, dobutamine stress test demonstrated inferolateral scare and ischemia within the anterior septal region and basal portion of the lateral wall. He was appropriately diuresed and placed on home diuretics and ACE inhibitor.    Since discharge he has continued to have mild DOE, and intermittent chest pain. He has home health nurses who set up his medications at home for the week. He is medically compliant. His son lives with him and takes him to appointments. He denies LEE or dizziness.   No Known Allergies  Current Outpatient Prescriptions  Medication Sig Dispense Refill  . albuterol (PROAIR HFA) 108 (90 BASE) MCG/ACT inhaler Inhale 2 puffs into the lungs every 6 (six) hours as needed.        Marland Kitchen aspirin EC 81 MG tablet Take 81 mg by mouth daily.      . CHOLESTYRAMINE PO Take by mouth daily.      . Cinnamon 500 MG capsule Take 1,000 mg by mouth daily.      . fish oil-omega-3 fatty acids 1000 MG capsule Take 1 g by mouth daily.       . furosemide (LASIX) 20 MG tablet Take 20 mg by mouth 2 (two) times daily.        Marland Kitchen HYDROcodone-homatropine (HYCODAN) 5-1.5 MG/5ML syrup Take 5 mLs by mouth every 4 (four) hours as needed. For cough       . lisinopril (PRINIVIL,ZESTRIL) 20 MG tablet Take 20 mg by mouth daily.      Marland Kitchen loperamide (IMODIUM) 2 MG capsule Take 2 mg by mouth as needed.       Marland Kitchen LORazepam (ATIVAN) 0.5 MG tablet Take 0.5 mg by mouth every 4 (four) hours as needed. For anxiety       . metFORMIN (GLUCOPHAGE) 500 MG tablet  Take 500 mg by mouth daily with breakfast.      . pantoprazole (PROTONIX) 40 MG tablet Take 40 mg by mouth daily.      . potassium chloride SA (K-DUR,KLOR-CON) 20 MEQ tablet Take 20 mEq by mouth daily.        Marland Kitchen pyridostigmine (MESTINON) 60 MG tablet Take 60 mg by mouth 2 (two) times daily.      . simvastatin (ZOCOR) 40 MG tablet Take 40 mg by mouth every evening.      . carvedilol (COREG) 6.25 MG tablet Take 1 tablet (6.25 mg total) by mouth 2 (two) times daily.  180 tablet  3    Past Medical History  Diagnosis Date  . Diabetes mellitus     type II  . Hypertension   . Pneumonia 2007  . Hyperlipidemia   . Adenocarcinoma 2003    COLON  . PVD (peripheral vascular disease)     ABI 0.78 RIGHT AND 0.80 LEFT 4/11  . Osteoarthritis     OF KNEES  . Kidney stone   . Schatzki's ring 08/05/10    egd with Dr. Jena Gauss  . Hiatal hernia 08/05/10    egd with Dr. Jena Gauss  . Esophageal dysmotility   . Microscopic colitis   . Internal  hemorrhoids   . Diverticula of colon   . CHF (congestive heart failure)     Past Surgical History  Procedure Date  . Hemicolectomy 2003  . Appendectomy   . Colonoscopy 04/2001    Dr. Karilyn Cota- colon carcinoma  . Esophagogastroduodenoscopy 04/2001    Dr. Karilyn Cota  . Esophagogastroduodenoscopy 08/05/10    schatzki ring, hiatal hernia  . Colonoscopy 09/04/09    diverticula, hemorrhoids    Family History  Problem Relation Age of Onset  . Coronary artery disease Father   . Coronary artery disease Brother     CABG  . Coronary artery disease Brother     History   Social History  . Marital Status: Married    Spouse Name: N/A    Number of Children: N/A  . Years of Education: N/A   Occupational History  . Not on file.   Social History Main Topics  . Smoking status: Former Games developer  . Smokeless tobacco: Never Used   Comment: Quit x 20 years  . Alcohol Use: No  . Drug Use: No  . Sexually Active: Not on file   Other Topics Concern  . Not on file   Social  History Narrative   Lives in [place]. Married. Wife in [place].Son lives with him    ZOX:WRUEAV of systems complete and found to be negative unless listed above   PHYSICAL EXAM BP 164/63  Pulse 67  Resp 18  Ht 5\' 8"  (1.727 m)  Wt 188 lb (85.276 kg)  BMI 28.59 kg/m2  General: Well developed, well nourished, in no acute distress Head: Left eyelid drooping, Right eye normal, No xanthomas.   Normal cephalic and atramatic  Lungs: Clear bilaterally to auscultation and percussion with diminished breath sounds in the bases.Marland Kitchen Heart: HRRR S1 S2,1/2 systolic murmur, .  Pulses are 2+ & equal.            No carotid bruit. No JVD.  No abdominal bruits. No femoral bruits. Abdomen: Bowel sounds are positive, abdomen soft and non-tender without masses or  Hernia's noted. Msk:  Back normal, slow  Gait, uses cane for ambulation.Diminsihed strength and tone for age. Extremities: No clubbing, cyanosis or edema.  DP +1 Neuro: Alert and oriented X 3.Hard of hearing Psych:  Good affect, responds appropriately   ASSESSMENT AND PLAN

## 2011-07-09 NOTE — OR Nursing (Signed)
Discharge instructions reviewed and signed, pt stated understanding, ambulated in hall without difficulty, site level 0, transported to son's car via wheelchair 

## 2011-07-09 NOTE — OR Nursing (Signed)
Tegaderm dressing applied, site level 0, bedrest begins at 0920 

## 2011-07-09 NOTE — OR Nursing (Signed)
Dr McAlhany at bedside to discuss results and treatment plan with pt and family 

## 2011-07-09 NOTE — OR Nursing (Signed)
Meal served 

## 2011-07-09 NOTE — Discharge Instructions (Signed)
Hold metformin for 48 hours post cath and then hold two days before PCI next week. Hold Protonix, Start Plavix 75 mg po Qdaily.

## 2011-07-11 ENCOUNTER — Telehealth: Payer: Self-pay | Admitting: Cardiovascular Disease

## 2011-07-11 ENCOUNTER — Other Ambulatory Visit: Payer: Self-pay

## 2011-07-11 ENCOUNTER — Encounter (HOSPITAL_COMMUNITY): Payer: Self-pay | Admitting: Pharmacy Technician

## 2011-07-11 MED ORDER — DOXAZOSIN MESYLATE 2 MG PO TABS: 2.0000 mg | ORAL_TABLET | Freq: Every day | ORAL | Status: DC

## 2011-07-11 NOTE — Telephone Encounter (Signed)
New msg Pt's son called. He was talking with you and said he also takes plavix 75 mg daily

## 2011-07-11 NOTE — Telephone Encounter (Signed)
**Note De-Identified  Obfuscation** Notified Sadie at [facility]. pharmacy that I updated pt's medications record in his chart. Son states that a pharmacist called him to check medications list and he forgot to mention Plavix 75 mg daily./LV

## 2011-07-11 NOTE — Telephone Encounter (Signed)
This pt is seen by Dr. Daleen Squibb in [place]. Will route to [facility] in [place]

## 2011-07-14 ENCOUNTER — Telehealth: Payer: Self-pay | Admitting: Adult Health

## 2011-07-14 NOTE — Telephone Encounter (Signed)
**Note De-Identified  Obfuscation** Pt. states that he was given instructions after cath on 5-15 telling him what to do and not to do before PCI of RCA lesion on 5-23. He states his son has instructions but is not there at this time. Pt. Is advised to wait until his son returns home and is he does not have instruction sheet to call us back./LV

## 2011-07-14 NOTE — Telephone Encounter (Signed)
Patient has questions about meds to take prior to having "heart surgery" on Thursday.Marland Kitchen / tg

## 2011-07-15 ENCOUNTER — Telehealth: Payer: Self-pay | Admitting: Cardiovascular Disease

## 2011-07-15 NOTE — Telephone Encounter (Signed)
**Note De-Identified  Obfuscation** Pt. and his son state that they were given an instruction sheet after cath on 5-15 for upcoming cardiac surgery. I answered all questions and advised them to follow instruction sheet, they both verbalized understanding./LV

## 2011-07-15 NOTE — Telephone Encounter (Signed)
Pt sees Dr. Daleen Squibb in [place].  Will route to [facility]

## 2011-07-15 NOTE — Telephone Encounter (Signed)
New msg Pt wants to know about what meds he is supposed to take or not to take. Please call

## 2011-07-17 ENCOUNTER — Encounter (HOSPITAL_COMMUNITY): Payer: Self-pay | Admitting: General Practice

## 2011-07-17 ENCOUNTER — Ambulatory Visit (HOSPITAL_COMMUNITY)
Admission: RE | Admit: 2011-07-17 | Discharge: 2011-07-18 | Disposition: A | Discharge status: Home / Self Care | Payer: Medicare Other | Source: Ambulatory Visit | Attending: Cardiovascular Disease | Admitting: Cardiovascular Disease

## 2011-07-17 ENCOUNTER — Encounter (HOSPITAL_COMMUNITY)
Admission: RE | Disposition: A | Discharge status: Home / Self Care | Payer: Self-pay | Source: Ambulatory Visit | Attending: Cardiovascular Disease

## 2011-07-17 DIAGNOSIS — I10 Essential (primary) hypertension: Secondary | ICD-10-CM | POA: Insufficient documentation

## 2011-07-17 DIAGNOSIS — I2510 Atherosclerotic heart disease of native coronary artery without angina pectoris: Secondary | ICD-10-CM

## 2011-07-17 DIAGNOSIS — R0602 Shortness of breath: Secondary | ICD-10-CM

## 2011-07-17 DIAGNOSIS — E785 Hyperlipidemia, unspecified: Secondary | ICD-10-CM | POA: Insufficient documentation

## 2011-07-17 DIAGNOSIS — I255 Ischemic cardiomyopathy: Secondary | ICD-10-CM | POA: Insufficient documentation

## 2011-07-17 DIAGNOSIS — E119 Type 2 diabetes mellitus without complications: Secondary | ICD-10-CM | POA: Insufficient documentation

## 2011-07-17 DIAGNOSIS — I200 Unstable angina: Secondary | ICD-10-CM | POA: Insufficient documentation

## 2011-07-17 HISTORY — DX: Anxiety disorder, unspecified: F41.9

## 2011-07-17 HISTORY — DX: Angina pectoris, unspecified: I20.9

## 2011-07-17 HISTORY — PX: PERCUTANEOUS CORONARY STENT INTERVENTION (PCI-S): SHX5485

## 2011-07-17 HISTORY — DX: Type 2 diabetes mellitus without complications: E11.9

## 2011-07-17 HISTORY — DX: Chronic obstructive pulmonary disease, unspecified: J44.9

## 2011-07-17 HISTORY — PX: ATHERECTOMY: SHX47

## 2011-07-17 HISTORY — DX: Gastro-esophageal reflux disease without esophagitis: K21.9

## 2011-07-17 HISTORY — DX: Ischemic cardiomyopathy: I25.5

## 2011-07-17 HISTORY — DX: Atherosclerotic heart disease of native coronary artery without angina pectoris: I25.10

## 2011-07-17 HISTORY — PX: CORONARY ANGIOPLASTY: SHX604

## 2011-07-17 HISTORY — DX: Shortness of breath: R06.02

## 2011-07-17 LAB — GLUCOSE, CAPILLARY: Glucose-Capillary: 98 mg/dL (ref 70–99)

## 2011-07-17 LAB — CBC
HCT: 28.9 % — ABNORMAL LOW (ref 39.0–52.0)
MCH: 31.1 pg (ref 26.0–34.0)
MCHC: 32.5 g/dL (ref 30.0–36.0)
MCV: 95.7 fL (ref 78.0–100.0)
RDW: 15.5 % (ref 11.5–15.5)
WBC: 6.3 10*3/uL (ref 4.0–10.5)

## 2011-07-17 LAB — BASIC METABOLIC PANEL
BUN: 13 mg/dL (ref 6–23)
Chloride: 105 mEq/L (ref 96–112)
Creatinine, Ser: 0.87 mg/dL (ref 0.50–1.35)
GFR calc Af Amer: >90 mL/min (ref >90)

## 2011-07-17 LAB — POCT ACTIVATED CLOTTING TIME: Activated Clotting Time: 523 seconds

## 2011-07-17 SURGERY — PERCUTANEOUS CORONARY STENT INTERVENTION (PCI-S)
Anesthesia: LOCAL

## 2011-07-17 MED ORDER — SIMVASTATIN 40 MG PO TABS
40.0000 mg | ORAL_TABLET | Freq: Every evening | ORAL | Status: DC
  Administered 2011-07-17: 40 mg via ORAL
  Filled 2011-07-17 (×2): qty 1

## 2011-07-17 MED ORDER — SODIUM CHLORIDE 0.9 % IJ SOLN: 3.0000 mL | Freq: Two times a day (BID) | INTRAMUSCULAR | Status: DC

## 2011-07-17 MED ORDER — ACETAMINOPHEN 325 MG PO TABS
650.0000 mg | ORAL_TABLET | ORAL | Status: DC | PRN
  Administered 2011-07-17: 650 mg via ORAL
  Filled 2011-07-17: qty 2

## 2011-07-17 MED ORDER — ASPIRIN 81 MG PO CHEW
324.0000 mg | CHEWABLE_TABLET | ORAL | Status: AC
  Administered 2011-07-17: 324 mg via ORAL
  Filled 2011-07-17: qty 4

## 2011-07-17 MED ORDER — CLOPIDOGREL BISULFATE 75 MG PO TABS
75.0000 mg | ORAL_TABLET | ORAL | Status: AC
  Administered 2011-07-17: 75 mg via ORAL
  Filled 2011-07-17: qty 1

## 2011-07-17 MED ORDER — INSULIN ASPART 100 UNIT/ML ~~LOC~~ SOLN: 0.0000 [IU] | Freq: Three times a day (TID) | SUBCUTANEOUS | Status: DC

## 2011-07-17 MED ORDER — PYRIDOSTIGMINE BROMIDE 60 MG PO TABS
60.0000 mg | ORAL_TABLET | Freq: Two times a day (BID) | ORAL | Status: DC
  Administered 2011-07-17: 60 mg via ORAL
  Filled 2011-07-17 (×3): qty 1

## 2011-07-17 MED ORDER — MIDAZOLAM HCL 2 MG/2ML IJ SOLN
INTRAMUSCULAR | Status: AC
  Filled 2011-07-17: qty 2

## 2011-07-17 MED ORDER — LIDOCAINE HCL (PF) 1 % IJ SOLN
INTRAMUSCULAR | Status: AC
  Filled 2011-07-17: qty 30

## 2011-07-17 MED ORDER — ASPIRIN EC 81 MG PO TBEC
81.0000 mg | DELAYED_RELEASE_TABLET | Freq: Every day | ORAL | Status: DC
  Filled 2011-07-17: qty 1

## 2011-07-17 MED ORDER — CLOPIDOGREL BISULFATE 75 MG PO TABS
75.0000 mg | ORAL_TABLET | Freq: Every day | ORAL | Status: DC
  Filled 2011-07-17: qty 1

## 2011-07-17 MED ORDER — SODIUM CHLORIDE 0.9 % IV SOLN: INTRAVENOUS | Status: AC

## 2011-07-17 MED ORDER — FENTANYL CITRATE 0.05 MG/ML IJ SOLN
INTRAMUSCULAR | Status: AC
  Filled 2011-07-17: qty 2

## 2011-07-17 MED ORDER — BIVALIRUDIN 250 MG IV SOLR
INTRAVENOUS | Status: AC
  Filled 2011-07-17: qty 250

## 2011-07-17 MED ORDER — DOXAZOSIN MESYLATE 2 MG PO TABS
2.0000 mg | ORAL_TABLET | Freq: Every day | ORAL | Status: DC
  Administered 2011-07-17: 2 mg via ORAL
  Filled 2011-07-17 (×2): qty 1

## 2011-07-17 MED ORDER — SODIUM CHLORIDE 0.9 % IJ SOLN: 3.0000 mL | INTRAMUSCULAR | Status: DC | PRN

## 2011-07-17 MED ORDER — ONDANSETRON HCL 4 MG/2ML IJ SOLN
4.0000 mg | Freq: Four times a day (QID) | INTRAMUSCULAR | Status: DC | PRN
  Administered 2011-07-17: 4 mg via INTRAVENOUS
  Filled 2011-07-17: qty 2

## 2011-07-17 MED ORDER — ALBUTEROL SULFATE HFA 108 (90 BASE) MCG/ACT IN AERS
2.0000 | INHALATION_SPRAY | Freq: Four times a day (QID) | RESPIRATORY_TRACT | Status: DC | PRN
  Filled 2011-07-17: qty 6.7

## 2011-07-17 MED ORDER — HEPARIN (PORCINE) IN NACL 2-0.9 UNIT/ML-% IJ SOLN
INTRAMUSCULAR | Status: AC
  Filled 2011-07-17: qty 2000

## 2011-07-17 MED ORDER — NITROGLYCERIN 0.2 MG/ML ON CALL CATH LAB
INTRAVENOUS | Status: AC
  Filled 2011-07-17: qty 1

## 2011-07-17 MED ORDER — CARVEDILOL 6.25 MG PO TABS
6.2500 mg | ORAL_TABLET | Freq: Two times a day (BID) | ORAL | Status: DC
  Administered 2011-07-17: 6.25 mg via ORAL
  Filled 2011-07-17 (×4): qty 1

## 2011-07-17 MED ORDER — SODIUM CHLORIDE 0.9 % IV SOLN
INTRAVENOUS | Status: DC
  Administered 2011-07-17: 11:00:00 via INTRAVENOUS

## 2011-07-17 MED ORDER — SODIUM CHLORIDE 0.9 % IV SOLN: 250.0000 mL | INTRAVENOUS | Status: DC | PRN

## 2011-07-17 NOTE — H&P (View-Only) (Signed)
 HPI: Howard [name] is a pleasant 78 y/o patient of Dr. Wall we are seeing on hospital follow-up after admission for systolic CHF, ischemic cardiomyopathy, hypertension. He also has a history of NIDDM, DJD, and myasthenia gravis. During hospitalization echocardiogram demonstrated significant deterioration of EF from 50% in 02/2011 to 35% with prominent hypokinesis of the anterior, apical, and inferior septal distribution. He was found to have mild AoV stenosis with valve area of 1.23, dobutamine stress test demonstrated inferolateral scare and ischemia within the anterior septal region and basal portion of the lateral wall. He was appropriately diuresed and placed on home diuretics and ACE inhibitor.    Since discharge he has continued to have mild DOE, and intermittent chest pain. He has home health nurses who set up his medications at home for the week. He is medically compliant. His son lives with him and takes him to appointments. He denies LEE or dizziness.   No Known Allergies  Current Outpatient Prescriptions  Medication Sig Dispense Refill  . albuterol (PROAIR HFA) 108 (90 BASE) MCG/ACT inhaler Inhale 2 puffs into the lungs every 6 (six) hours as needed.        . aspirin EC 81 MG tablet Take 81 mg by mouth daily.      . CHOLESTYRAMINE PO Take by mouth daily.      . Cinnamon 500 MG capsule Take 1,000 mg by mouth daily.      . fish oil-omega-3 fatty acids 1000 MG capsule Take 1 g by mouth daily.       . furosemide (LASIX) 20 MG tablet Take 20 mg by mouth 2 (two) times daily.        . HYDROcodone-homatropine (HYCODAN) 5-1.5 MG/5ML syrup Take 5 mLs by mouth every 4 (four) hours as needed. For cough       . lisinopril (PRINIVIL,ZESTRIL) 20 MG tablet Take 20 mg by mouth daily.      . loperamide (IMODIUM) 2 MG capsule Take 2 mg by mouth as needed.       . LORazepam (ATIVAN) 0.5 MG tablet Take 0.5 mg by mouth every 4 (four) hours as needed. For anxiety       . metFORMIN (GLUCOPHAGE) 500 MG tablet  Take 500 mg by mouth daily with breakfast.      . pantoprazole (PROTONIX) 40 MG tablet Take 40 mg by mouth daily.      . potassium chloride SA (K-DUR,KLOR-CON) 20 MEQ tablet Take 20 mEq by mouth daily.        . pyridostigmine (MESTINON) 60 MG tablet Take 60 mg by mouth 2 (two) times daily.      . simvastatin (ZOCOR) 40 MG tablet Take 40 mg by mouth every evening.      . carvedilol (COREG) 6.25 MG tablet Take 1 tablet (6.25 mg total) by mouth 2 (two) times daily.  180 tablet  3    Past Medical History  Diagnosis Date  . Diabetes mellitus     type II  . Hypertension   . Pneumonia 2007  . Hyperlipidemia   . Adenocarcinoma 2003    COLON  . PVD (peripheral vascular disease)     ABI 0.78 RIGHT AND 0.80 LEFT 4/11  . Osteoarthritis     OF KNEES  . Kidney stone   . Schatzki's ring 08/05/10    egd with Dr. Rourk  . Hiatal hernia 08/05/10    egd with Dr. Rourk  . Esophageal dysmotility   . Microscopic colitis   . Internal   hemorrhoids   . Diverticula of colon   . CHF (congestive heart failure)     Past Surgical History  Procedure Date  . Hemicolectomy 2003  . Appendectomy   . Colonoscopy 04/2001    Dr. Rehman- colon carcinoma  . Esophagogastroduodenoscopy 04/2001    Dr. Rehman  . Esophagogastroduodenoscopy 08/05/10    schatzki ring, hiatal hernia  . Colonoscopy 09/04/09    diverticula, hemorrhoids    Family History  Problem Relation Age of Onset  . Coronary artery disease Father   . Coronary artery disease Brother     CABG  . Coronary artery disease Brother     History   Social History  . Marital Status: Married    Spouse Name: N/A    Number of Children: N/A  . Years of Education: N/A   Occupational History  . Not on file.   Social History Main Topics  . Smoking status: Former Smoker  . Smokeless tobacco: Never Used   Comment: Quit x 20 years  . Alcohol Use: No  . Drug Use: No  . Sexually Active: Not on file   Other Topics Concern  . Not on file   Social  History Narrative   Lives in [place]. Married. Wife in [place].Son lives with him    ROS:Review of systems complete and found to be negative unless listed above   PHYSICAL EXAM BP 164/63  Pulse 67  Resp 18  Ht 5' 8" (1.727 m)  Wt 188 lb (85.276 kg)  BMI 28.59 kg/m2  General: Well developed, well nourished, in no acute distress Head: Left eyelid drooping, Right eye normal, No xanthomas.   Normal cephalic and atramatic  Lungs: Clear bilaterally to auscultation and percussion with diminished breath sounds in the bases.. Heart: HRRR S1 S2,1/2 systolic murmur, .  Pulses are 2+ & equal.            No carotid bruit. No JVD.  No abdominal bruits. No femoral bruits. Abdomen: Bowel sounds are positive, abdomen soft and non-tender without masses or  Hernia's noted. Msk:  Back normal, slow  Gait, uses cane for ambulation.Diminsihed strength and tone for age. Extremities: No clubbing, cyanosis or edema.  DP +1 Neuro: Alert and oriented X 3.Hard of hearing Psych:  Good affect, responds appropriately   ASSESSMENT AND PLAN   

## 2011-07-17 NOTE — Progress Notes (Signed)
Site area: left groin  Site Prior to Removal:  Level 0  Pressure Applied For 30 MINUTES    Minutes Beginning at 1515  Manual:   yes  Patient Status During Pull:  Stable   Post Pull Groin Site:  Level 0  Post Pull Instructions Given:  yes  Post Pull Pulses Present:  yes  Dressing Applied:  yes  Comments:  Veinous sheath pulled; oozing prior to pull; held total of 30 minutes with achievement of hemostasis.

## 2011-07-17 NOTE — Interval H&P Note (Signed)
History and Physical Interval Note:  07/17/2011 11:02 AM  Howard [name]  has presented today for PCI of the RCA with rotablator atherectomy with the diagnosis of CAD/UA. Outpatient cath in Epic.  The various methods of treatment have been discussed with the patient and family. After consideration of risks, benefits and other options for treatment, the patient has consented to  Procedure(s) (LRB): PERCUTANEOUS CORONARY STENT INTERVENTION (PCI-S) (N/A) as a surgical intervention .  The patients' history has been reviewed, patient examined, no change in status, stable for surgery.  I have reviewed the patients' chart and labs.  Questions were answered to the patient's satisfaction.     [name]

## 2011-07-17 NOTE — CV Procedure (Addendum)
   Cardiac Catheterization Operative Report  Howard [name] [id number] 5/23/20131:04 PM [name], MD, MD  Procedure Performed:  1. PTCA/DES x 1 mid RCA 2. Rotablator atherectomy 3. Temporary pacemaker  Operator: Verne Carrow, MD  Indication:   Pt with unstable angina. Cardiac cath last week with severe stenosis mid RCA. Staged PCI of the RCA today with plans for Rotablator atherectomy.                                      Procedure Details: The risks, benefits, complications, treatment options, and expected outcomes were discussed with the patient. The patient and/or family concurred with the proposed plan, giving informed consent. The patient was brought to the cath lab after IV hydration was begun and oral premedication was given. The patient was further sedated with Versed and Fentanyl. The left groin was prepped and draped in the usual manner. Using the modified Seldinger access technique, a 7 French sheath was placed in the left femoral artery. A 6 French sheath was placed in the left femoral vein. A single electrode pacing wire was advanced into the RV. I then engaged the RCA with a 7 [place] JR-4 guiding catheter. A bolus of Angiomax was given and a drip was started. When the ACT was greater than 200, I passed a floppy Rota wire down the RCA. We then used a 1.5 mm Rota burr and treated with mid RCA heavily calcified area. I then used a 2.0 mm Rota burr to treat the mid RCA. A 2.5 x 15 mm balloon was inflated twice in the mid RCA. I then placed a 3.0 x 24 Promus Element DES in the mid RCA. This was post-dilated with a 3.25 x 15 mm [place] balloon x 2. I had to use a Cougar IC wire to get the post-dilatation balloon into the stent. There was an excellent result. There was goof flow into the distal vessel. I placed a 8 French Angioseal in the left femoral artery.   There were no immediate complications. The patient was taken to the recovery area in stable condition.   Hemodynamic  Findings: Central aortic pressure: 162/68  Impression: 1. Successful PTCA/DES/Rotablator atherectomy RCA   Recommendations: Will continue ASA and Plavix for at least one year. Continue other cardiac meds. Watch for next 18 hours.        Complications:  None; patient tolerated the procedure well.                Addendum: 07/22/11: Please note that temporary pacemaker inserted during the case since this was a atherectomy of the RCA which is high risk for heart block. Pacer activated once during atherectomy for bradycardia.

## 2011-07-18 ENCOUNTER — Encounter (HOSPITAL_COMMUNITY): Payer: Self-pay | Admitting: Nurse Practitioner

## 2011-07-18 DIAGNOSIS — E785 Hyperlipidemia, unspecified: Secondary | ICD-10-CM | POA: Insufficient documentation

## 2011-07-18 DIAGNOSIS — R0789 Other chest pain: Secondary | ICD-10-CM | POA: Insufficient documentation

## 2011-07-18 DIAGNOSIS — I10 Essential (primary) hypertension: Secondary | ICD-10-CM | POA: Insufficient documentation

## 2011-07-18 DIAGNOSIS — E119 Type 2 diabetes mellitus without complications: Secondary | ICD-10-CM | POA: Insufficient documentation

## 2011-07-18 DIAGNOSIS — I255 Ischemic cardiomyopathy: Secondary | ICD-10-CM | POA: Insufficient documentation

## 2011-07-18 DIAGNOSIS — I200 Unstable angina: Secondary | ICD-10-CM

## 2011-07-18 DIAGNOSIS — I2510 Atherosclerotic heart disease of native coronary artery without angina pectoris: Secondary | ICD-10-CM

## 2011-07-18 LAB — BASIC METABOLIC PANEL
Chloride: 103 mEq/L (ref 96–112)
Creatinine, Ser: 1.01 mg/dL (ref 0.50–1.35)
GFR calc Af Amer: 79 mL/min — ABNORMAL LOW (ref >90)
Potassium: 4.4 mEq/L (ref 3.5–5.1)
Sodium: 139 mEq/L (ref 135–145)

## 2011-07-18 LAB — CBC
MCV: 96.4 fL (ref 78.0–100.0)
Platelets: 174 10*3/uL (ref 150–400)
RBC: 2.77 MIL/uL — ABNORMAL LOW (ref 4.22–5.81)
RDW: 15.5 % (ref 11.5–15.5)
WBC: 6.1 10*3/uL (ref 4.0–10.5)

## 2011-07-18 MED ORDER — NITROGLYCERIN 0.4 MG SL SUBL: 0.4000 mg | SUBLINGUAL_TABLET | SUBLINGUAL | Status: DC | PRN

## 2011-07-18 MED ORDER — METFORMIN HCL 500 MG PO TABS: 500.0000 mg | ORAL_TABLET | Freq: Every day | ORAL | Status: DC

## 2011-07-18 MED FILL — Dextrose Inj 5%: INTRAVENOUS | Qty: 1000 | Status: AC

## 2011-07-18 NOTE — Progress Notes (Signed)
CARDIAC REHAB PHASE I   PRE:  Rate/Rhythm: 72 SR  BP:  Supine: 135/57  Sitting:   Standing:    SaO2:   MODE:  Ambulation: 300 ft   POST:  Rate/Rhythem: 96 SR  BP:  Supine:   Sitting: 130/45  Standing:    SaO2:  [id number] Assisted X 1 and used his cane to ambulate. Gait steady, slow pace. Pt only c/o with walking is his "bad knees".VS stable. Pt to recliner after walk with call light in reach. Completed discharge education with pt. He has HH RN that fills his medication box. States this is a hugh help for him.Pt states that his son lives there but he is unable to help him much,other than to drive him appointments. Pt does his own cooking, but he has a lot of trouble with this due to having visual problems.  Howard [name]

## 2011-07-18 NOTE — Discharge Summary (Signed)
Patient ID: Howard [name],  MRN: [id number], DOB/AGE: 1929/02/26 78 y.o.  Admit date: 07/17/2011 Discharge date: 07/18/2011  Primary Care Provider: [name], MD Primary Cardiologist: T. Wall, MD  Discharge Diagnoses Principal Problem:  *Unstable angina Active Problems:  CAD (coronary artery disease)  **s/p Rotablator atherectomy with DES of the RCA this admission.  Hypertension  Hyperlipidemia  Ischemic cardiomyopathy  Type II diabetes mellitus  Myasthenia Gravis Allergies No Known Allergies  Procedures  Percutaneous Coronary Intervention 07/17/2011  The mid-RCA was treated with rotablator atherectomy followed by placement of a 3.0 x 24mm Promus Element DES. _____________  History of Present Illness  78 y/o male who was recently was seen in clinic following admission for CHF and abnormal dobutamine myoview.  He continued to c/o mild doe and intermittent chest pain and subsequently underwent outpt cath on 5/15 showing severe RCA dzs.  He was started on asa and plavix and arrangements were made for him to return for PCI of the RCA.  Hospital Course  Pt presented [facility] cath lab and underwent successful rotablator atherectomy followed by drug eluting stent placement within the mid-RCA.  It should be noted that post-procedure he has shown lateral TWI on ECG however he has been ambulating w/o limitations or recurrent symptoms and will be discharged home today in good condition.   Discharge Vitals Blood pressure 135/57, pulse 70, temperature 98.5 F (36.9 C), temperature source Oral, resp. rate 20, height 5' 8.5" (1.74 m), weight 190 lb 14.7 oz (86.6 kg), SpO2 94.00%.  Filed Weights   07/17/11 1005 07/17/11 1021 07/18/11 0415  Weight: 191 lb (86.637 kg) 191 lb (86.637 kg) 190 lb 14.7 oz (86.6 kg)   Labs  CBC  Basename 07/18/11 0420 07/17/11 1030  WBC 6.1 6.3  NEUTROABS -- --  HGB 8.6* 9.4*  HCT 26.7* 28.9*  MCV 96.4 95.7  PLT 174 190   Basic Metabolic  Panel  Basename 07/18/11 0420 07/17/11 1030  NA 139 141  K 4.4 4.2  CL 103 105  CO2 28 27  GLUCOSE 108* 114*  BUN 16 13  CREATININE 1.01 0.87  CALCIUM 8.8 9.1  MG -- --  PHOS -- --   Disposition  Pt is being discharged home today in good condition.  Follow-up Plans & Appointments  Follow-up Information    Follow up with Valera Castle, MD on 07/30/2011. (9:30 AM)    Contact information:   [facility] [phone]       Follow up with [name], MD. (as scheduled)    Contact information:   [address] [place] [address] [phone]         Discharge Medications  Medication List  As of 07/18/2011  9:11 AM   TAKE these medications         aspirin EC 81 MG tablet   Take 81 mg by mouth daily.      carvedilol 6.25 MG tablet   Commonly known as: COREG   Take 6.25 mg by mouth 2 (two) times daily.      CHOLESTYRAMINE PO   Take 1 packet by mouth daily.      Cinnamon 500 MG capsule   Take 1,000 mg by mouth daily.      clopidogrel 75 MG tablet   Commonly known as: PLAVIX   Take 75 mg by mouth daily.      doxazosin 2 MG tablet   Commonly known as: CARDURA   Take 1 tablet (2  mg total) by mouth at bedtime.      fish oil-omega-3 fatty acids 1000 MG capsule   Take 1 g by mouth daily.      furosemide 20 MG tablet   Commonly known as: LASIX   Take 20 mg by mouth 2 (two) times daily.      HYDROcodone-homatropine 5-1.5 MG/5ML syrup   Commonly known as: HYCODAN   Take 5 mLs by mouth every 4 (four) hours as needed. For cough      loperamide 2 MG capsule   Commonly known as: IMODIUM   Take 2 mg by mouth as needed. For runny stools      LORazepam 0.5 MG tablet   Commonly known as: ATIVAN   Take 0.5 mg by mouth every 4 (four) hours as needed. For anxiety      metFORMIN 500 MG tablet   Commonly known as: GLUCOPHAGE   Take 1 tablet (500 mg total) by mouth daily.      nitroGLYCERIN 0.4 MG SL tablet   Commonly known as:  NITROSTAT   Place 1 tablet (0.4 mg total) under the tongue every 5 (five) minutes as needed for chest pain.      pantoprazole 40 MG tablet   Commonly known as: PROTONIX   Take 40 mg by mouth daily.      potassium chloride SA 20 MEQ tablet   Commonly known as: K-DUR,KLOR-CON   Take 20 mEq by mouth daily.      PROAIR HFA 108 (90 BASE) MCG/ACT inhaler   Generic drug: albuterol   Inhale 2 puffs into the lungs every 6 (six) hours as needed. For shortness of breath      pyridostigmine 60 MG tablet   Commonly known as: MESTINON   Take 60 mg by mouth 2 (two) times daily.      simvastatin 40 MG tablet   Commonly known as: ZOCOR   Take 40 mg by mouth every evening.           Outstanding Labs/Studies  None  Duration of Discharge Encounter   Greater than 30 minutes including physician time.  Signed, Nicolasa Ducking NP 07/18/2011, 9:11 AM   Pt seen this am. Full note in chart. cdm

## 2011-07-18 NOTE — Progress Notes (Signed)
Patient Name: Howard [name] Date of Encounter: 07/18/2011    Principal Problem:  *Unstable angina Active Problems:  CAD (coronary artery disease)  Hypertension  Hyperlipidemia  Ischemic cardiomyopathy  Type II diabetes mellitus    SUBJECTIVE  Denies chest pain, feeling much better.  CURRENT MEDS    . aspirin  324 mg Oral Pre-Cath  . aspirin EC  81 mg Oral Daily  . bivalirudin      . bivalirudin      . carvedilol  6.25 mg Oral BID WC  . clopidogrel  75 mg Oral Pre-Cath  . clopidogrel  75 mg Oral Q breakfast  . doxazosin  2 mg Oral QHS  . fentaNYL      . heparin      . insulin aspart  0-9 Units Subcutaneous TID WC  . lidocaine      . midazolam      . nitroGLYCERIN      . pyridostigmine  60 mg Oral BID  . simvastatin  40 mg Oral QPM  . DISCONTD: sodium chloride  3 mL Intravenous Q12H    OBJECTIVE  Filed Vitals:   08/05/2011 2009 07/18/11 0000 07/18/11 0415 07/18/11 0750  BP: 163/67 113/45 129/51 135/57  Pulse: 76 69 69 70  Temp: 98.5 F (36.9 C) 98.5 F (36.9 C) 98.6 F (37 C) 98.5 F (36.9 C)  TempSrc: Oral Oral Oral Oral  Resp: 19 18 17 20   Height:      Weight:   190 lb 14.7 oz (86.6 kg)   SpO2:    94%    Intake/Output Summary (Last 24 hours) at 07/18/11 0840 Last data filed at 08/05/2011 2100  Gross per 24 hour  Intake    895 ml  Output      0 ml  Net    895 ml   Filed Weights   08-05-11 1005 2011-08-05 1021 07/18/11 0415  Weight: 191 lb (86.637 kg) 191 lb (86.637 kg) 190 lb 14.7 oz (86.6 kg)    PHYSICAL EXAM  General: Pleasant, NAD. Neuro: Alert and oriented X 3. Limited ROM (R)knee. Psych: Normal affect. HEENT:  Hard of hearing (L) ear. (L) eye droop  Neck: Supple without bruits or JVD. Lungs:  Resp regular and unlabored, CTA. Heart: RRR no s3, s4, or murmurs. Abdomen: Soft, non-tender, obese, BS + x 4.  Extremities: No clubbing, cyanosis or edema. DP/PT/Radials 2+ and equal bilaterally.  R groin markedly ecchymotic, L groin w/o bleeding,  bruit, hematoma.  Accessory Clinical Findings  CBC  Basename 07/18/11 0420 2011-08-05 1030  WBC 6.1 6.3  NEUTROABS -- --  HGB 8.6* 9.4*  HCT 26.7* 28.9*  MCV 96.4 95.7  PLT 174 190   Basic Metabolic Panel  Basename 07/18/11 0420 08-05-2011 1030  NA 139 141  K 4.4 4.2  CL 103 105  CO2 28 27  GLUCOSE 108* 114*  BUN 16 13  CREATININE 1.01 0.87  CALCIUM 8.8 9.1  MG -- --  PHOS -- --   TELE  RSR  ECG  RSR with lateral TWI - more pronounced than on 08/05/2022 ECG.  ASSESSMENT AND PLAN  1.  Unstable angina/CAD:  S/P PCI/DES to RCA yesterday.  ECG with TWI laterally - more pronounced than immediately post-procedure but patient is pain free and ambulating w/o difficulty.  Plan to d/c today on current meds.  Will provide Rx for SL NTG also.  2.  HTN- stable on coreg and cardura  3.  Hyperlipidemia-managed with  Statin.  4.  DM:  Hold metformin for 48 hrs.  5.  Dispo:  D/c today. Pt has follow-up appointment on June 5th in [place].  Signed, Nicolasa Ducking NP   I have personally seen and examined this patient.  I agree with the assessment and plan as outlined above. Ok to d/c home today. Doing well post PCI.   [name] 2:33 PM 07/18/2011

## 2011-07-18 NOTE — Discharge Instructions (Signed)
***  PLEASE REMEMBER TO BRING ALL OF YOUR MEDICATIONS TO EACH OF YOUR FOLLOW-UP OFFICE VISITS.  Groin Site Care Refer to this sheet in the next few weeks. These instructions provide you with information on caring for yourself after your procedure. Your caregiver may also give you more specific instructions. Your treatment has been planned according to current medical practices, but problems sometimes occur. Call your caregiver if you have any problems or questions after your procedure. HOME CARE INSTRUCTIONS  You may shower 24 hours after the procedure. Remove the bandage (dressing) and gently wash the site with plain soap and water. Gently pat the site dry.   Do not apply powder or lotion to the site.   Do not sit in a bathtub, swimming pool, or whirlpool for 5 to 7 days.   No bending, squatting, or lifting anything over 10 pounds (4.5 kg) as directed by your caregiver.   Inspect the site at least twice daily.  NO HEAVY LIFTING OR SEXUAL ACTIVITY X 7 DAYS. NO DRIVING X 2-3 DAYS. NO SOAKING BATHS, HOT TUBS, POOLS, ETC., X 7 DAYS.  What to expect:  Any bruising will usually fade within 1 to 2 weeks.   Blood that collects in the tissue (hematoma) may be painful to the touch. It should usually decrease in size and tenderness within 1 to 2 weeks.  SEEK IMMEDIATE MEDICAL CARE IF:  You have unusual pain at the groin site or down the affected leg.   You have redness, warmth, swelling, or pain at the groin site.   You have drainage (other than a small amount of blood on the dressing).   You have chills.   You have a fever or persistent symptoms for more than 72 hours.   You have a fever and your symptoms suddenly get worse.   Your leg becomes pale, cool, tingly, or numb.  You have heavy bleeding from the site. Hold pressure on the site. Marland Kitchen

## 2011-07-18 NOTE — Progress Notes (Signed)
Patient is active with [facility] Care Management.  We will provide a transition of care call and resume services. For any additional questions or new referrals please contact Anibal Henderson BSN RN [facility] Liaison at [phone].

## 2011-07-30 ENCOUNTER — Ambulatory Visit (INDEPENDENT_AMBULATORY_CARE_PROVIDER_SITE_OTHER): Payer: Medicare Other | Admitting: Cardiology

## 2011-07-30 ENCOUNTER — Encounter: Payer: Self-pay | Admitting: Cardiology

## 2011-07-30 VITALS — BP 135/66 | HR 67 | Resp 18 | Ht 68.0 in | Wt 188.0 lb

## 2011-07-30 DIAGNOSIS — I2589 Other forms of chronic ischemic heart disease: Secondary | ICD-10-CM

## 2011-07-30 DIAGNOSIS — I2510 Atherosclerotic heart disease of native coronary artery without angina pectoris: Secondary | ICD-10-CM

## 2011-07-30 DIAGNOSIS — I255 Ischemic cardiomyopathy: Secondary | ICD-10-CM

## 2011-07-30 DIAGNOSIS — I200 Unstable angina: Secondary | ICD-10-CM

## 2011-07-30 DIAGNOSIS — I252 Old myocardial infarction: Secondary | ICD-10-CM

## 2011-07-30 MED ORDER — IRON 325 (65 FE) MG PO TABS: 325.0000 mg | ORAL_TABLET | Freq: Two times a day (BID) | ORAL | Status: DC

## 2011-07-30 NOTE — Assessment & Plan Note (Signed)
His symptoms of shortness of breath have improved since stent placement. No change in medications. I'll see him for close followup in 3 months. I released him to drive locally only.

## 2011-07-30 NOTE — Patient Instructions (Signed)
**Note De-identified  Obfuscation** Your physician recommends that you continue on your current medications as directed. Please refer to the Current Medication list given to you today.  Your physician recommends that you schedule a follow-up appointment in: 3 months  

## 2011-07-30 NOTE — Progress Notes (Signed)
HPI Howard [name] returns today for close followup after coronary intervention. He received a  drug-eluting stent to the right coronary artery. Since that time, his breathing has improved. He denies any chest pain.  Other than some bruising from his interventions he has no complaints. He denies orthopnea, PND.  He is compliant with his meds.  Past Medical History  Diagnosis Date  . Hypertension   . Hyperlipidemia   . Adenocarcinoma 2003    COLON  . PVD (peripheral vascular disease)     ABI 0.78 RIGHT AND 0.80 LEFT 4/11  . Osteoarthritis     OF KNEES  . Kidney stone   . Schatzki's ring 08/05/10    egd with Dr. Jena Gauss  . Hiatal hernia 08/05/10    egd with Dr. Jena Gauss  . Esophageal dysmotility   . Microscopic colitis   . Internal hemorrhoids   . Diverticula of colon   . CHF (congestive heart failure)   . Coronary artery stenosis     a. 06/2011 Cath: LM 20, LAD min irregs, D1 95 - small,  LCX 60p, RCA 61m, 50d, PLA 50ost;  b. 06/2011 PCI/Rota  RCA  -> 3.0x7mm Promus Element DES  . Angina   . COPD (chronic obstructive pulmonary disease)   . Asthma   . Pneumonia ~ 2005; 09/2005  . Shortness of breath 07/17/11    "laying down"  . Type II diabetes mellitus   . GERD (gastroesophageal reflux disease)   . Anxiety   . Ischemic cardiomyopathy     a. 05/2011 Echo: EF 45-50%, inf/post HK    Current Outpatient Prescriptions  Medication Sig Dispense Refill  . albuterol (PROAIR HFA) 108 (90 BASE) MCG/ACT inhaler Inhale 2 puffs into the lungs every 6 (six) hours as needed. For shortness of breath      . aspirin EC 81 MG tablet Take 81 mg by mouth daily.      . carvedilol (COREG) 6.25 MG tablet Take 6.25 mg by mouth 2 (two) times daily.      . CHOLESTYRAMINE PO Take 1 packet by mouth daily.       . Cinnamon 500 MG capsule Take 1,000 mg by mouth daily.      . clopidogrel (PLAVIX) 75 MG tablet Take 75 mg by mouth daily.      Marland Kitchen doxazosin (CARDURA) 2 MG tablet Take 1 tablet (2 mg total) by mouth at  bedtime.  30 tablet  6  . fish oil-omega-3 fatty acids 1000 MG capsule Take 1 g by mouth daily.       . furosemide (LASIX) 20 MG tablet Take 20 mg by mouth 2 (two) times daily.       Marland Kitchen HYDROcodone-homatropine (HYCODAN) 5-1.5 MG/5ML syrup Take 5 mLs by mouth every 4 (four) hours as needed. For cough      . loperamide (IMODIUM) 2 MG capsule Take 2 mg by mouth as needed. For runny stools      . LORazepam (ATIVAN) 0.5 MG tablet Take 0.5 mg by mouth every 4 (four) hours as needed. For anxiety       . metFORMIN (GLUCOPHAGE) 500 MG tablet Take 1 tablet (500 mg total) by mouth daily.      . nitroGLYCERIN (NITROSTAT) 0.4 MG SL tablet Place 1 tablet (0.4 mg total) under the tongue every 5 (five) minutes as needed for chest pain.  25 tablet  3  . pantoprazole (PROTONIX) 40 MG tablet Take 40 mg by mouth daily.      Marland Kitchen  potassium chloride SA (K-DUR,KLOR-CON) 20 MEQ tablet Take 20 mEq by mouth daily.       Marland Kitchen pyridostigmine (MESTINON) 60 MG tablet Take 60 mg by mouth 2 (two) times daily.      . simvastatin (ZOCOR) 40 MG tablet Take 40 mg by mouth every evening.        No Known Allergies  Family History  Problem Relation Age of Onset  . Coronary artery disease Father   . Coronary artery disease Brother     CABG  . Coronary artery disease Brother     History   Social History  . Marital Status: Married    Spouse Name: N/A    Number of Children: N/A  . Years of Education: N/A   Occupational History  . Not on file.   Social History Main Topics  . Smoking status: Former Smoker -- 20 years    Types: Cigarettes    Quit date: 02/24/1997  . Smokeless tobacco: Never Used  . Alcohol Use: No  . Drug Use: No  . Sexually Active: Not Currently   Other Topics Concern  . Not on file   Social History Narrative   Lives in [place]. Married. Wife in [place].Son lives with him    ROS ALL NEGATIVE EXCEPT THOSE NOTED IN HPI  PE  General Appearance: well developed, well nourished in no acute distress,  overweight HEENT: symmetrical face, PERRLA Neck: no JVD, thyromegaly, or adenopathy, trachea midline Chest: symmetric without deformity Cardiac: PMI non-displaced, RRR, normal S1, S2, no gallop 2/6 systolic murmur left lower sternal border Lung: clear to ausculation and percussion Vascular: Pulses difficult to palpate in the lower extremities but his feet are warm Abdominal: nondistended, nontender, good bowel sounds, no HSM, no bruits Extremities: no cyanosis, clubbing or edema, no sign of DVT, no varicosities  Skin: normal color, no rashes Neuro: alert and oriented x 3, non-focal Pysch: normal affect  EKG  BMET    Component Value Date/Time   NA 139 07/18/2011 0420   K 4.4 07/18/2011 0420   CL 103 07/18/2011 0420   CO2 28 07/18/2011 0420   GLUCOSE 108* 07/18/2011 0420   BUN 16 07/18/2011 0420   CREATININE 1.01 07/18/2011 0420   CREATININE 0.85 07/07/2011 1419   CALCIUM 8.8 07/18/2011 0420   GFRNONAA 68* 07/18/2011 0420   GFRAA 79* 07/18/2011 0420    Lipid Panel  No results found for this basename: chol, trig, hdl, cholhdl, vldl, ldlcalc    CBC    Component Value Date/Time   WBC 6.1 07/18/2011 0420   RBC 2.77* 07/18/2011 0420   HGB 8.6* 07/18/2011 0420   HCT 26.7* 07/18/2011 0420   PLT 174 07/18/2011 0420   MCV 96.4 07/18/2011 0420   MCH 31.0 07/18/2011 0420   MCHC 32.2 07/18/2011 0420   RDW 15.5 07/18/2011 0420   LYMPHSABS 1.6 07/07/2011 1419   MONOABS 0.8 07/07/2011 1419   EOSABS 0.2 07/07/2011 1419   BASOSABS 0.0 07/07/2011 1419

## 2011-07-30 NOTE — Progress Notes (Signed)
**Note De-Identified Alle Difabio Obfuscation** Addended by: Demetrios Loll on: 07/30/2011 02:42 PM   Modules accepted: Orders

## 2011-08-01 ENCOUNTER — Telehealth: Payer: Self-pay | Admitting: Internal Medicine

## 2011-08-01 NOTE — Telephone Encounter (Signed)
He needs the iron due to anemia. I would recommend he cut back on cholestyramine to 1/2 scoop or 2g daily. He should hold if no BM in 24 hours. He should call us if increased diarrhea.

## 2011-08-01 NOTE — Telephone Encounter (Signed)
Pt aware, he will call us if he has any problems.

## 2011-08-01 NOTE — Telephone Encounter (Signed)
Patient called his home health nurse came out and seen where Dr. Jena Gauss has the patient taking Cholestyramine and Dr. Daleen Squibb ( cardiologist) has him taking Ferrous and she advised him not to be taking both medications because it could cause constipation, please advise?

## 2011-08-19 ENCOUNTER — Other Ambulatory Visit: Payer: Self-pay | Admitting: Cardiology

## 2011-08-19 ENCOUNTER — Other Ambulatory Visit: Payer: Self-pay | Admitting: Internal Medicine

## 2011-08-20 ENCOUNTER — Telehealth: Payer: Self-pay | Admitting: Cardiology

## 2011-08-20 DIAGNOSIS — D649 Anemia, unspecified: Secondary | ICD-10-CM

## 2011-08-20 NOTE — Telephone Encounter (Signed)
**Note De-Identified  Obfuscation** Pt. encouraged to finish taking Iron as he only has another week left to take, he states he will try. Pt. advised to have CBC drawn next week to see if anemia has improved. CBC ordered and faxed to [facility] and reminder letter mailed to pt's address./LV

## 2011-08-20 NOTE — Telephone Encounter (Signed)
States that patient was prescribed Iron BID but can no take it twice a day.  Wants to know if it is okay to take it once a day. / tg

## 2011-08-25 LAB — CBC WITH DIFFERENTIAL/PLATELET
Eosinophils Absolute: 0.2 10*3/uL (ref 0.0–0.7)
Eosinophils Relative: 3 % (ref 0–5)
Lymphs Abs: 1.4 10*3/uL (ref 0.7–4.0)
MCH: 32 pg (ref 26.0–34.0)
MCHC: 33.9 g/dL (ref 30.0–36.0)
MCV: 94.4 fL (ref 78.0–100.0)
Monocytes Relative: 12 % (ref 3–12)
Platelets: 175 10*3/uL (ref 150–400)
RBC: 3.22 MIL/uL — ABNORMAL LOW (ref 4.22–5.81)

## 2011-08-27 ENCOUNTER — Other Ambulatory Visit: Payer: Self-pay

## 2011-08-27 MED ORDER — THERA VITAL M PO TABS: 1.0000 | ORAL_TABLET | Freq: Every day | ORAL | Status: DC

## 2011-09-15 ENCOUNTER — Telehealth: Payer: Self-pay | Admitting: *Deleted

## 2011-09-15 NOTE — Telephone Encounter (Signed)
Howard [name] called today. He has a question regarding one of his medications and needs a refill on it, cholestyramine. Please call him back. Thanks.

## 2011-09-16 ENCOUNTER — Other Ambulatory Visit: Payer: Self-pay

## 2011-09-16 MED ORDER — CHOLESTYRAMINE 4 GM/DOSE PO POWD: 2.0000 g | Freq: Every day | ORAL | Status: DC

## 2011-09-16 NOTE — Telephone Encounter (Signed)
Please let pt know this was refilled.

## 2011-09-17 NOTE — Telephone Encounter (Signed)
Pt is aware.  

## 2011-10-14 ENCOUNTER — Encounter: Payer: Self-pay | Admitting: Cardiology

## 2011-10-14 ENCOUNTER — Ambulatory Visit (INDEPENDENT_AMBULATORY_CARE_PROVIDER_SITE_OTHER): Payer: Medicare Other | Admitting: Cardiology

## 2011-10-14 VITALS — BP 150/70 | HR 92 | Ht 68.5 in | Wt 193.0 lb

## 2011-10-14 DIAGNOSIS — I2510 Atherosclerotic heart disease of native coronary artery without angina pectoris: Secondary | ICD-10-CM

## 2011-10-14 NOTE — Assessment & Plan Note (Signed)
His ischemic equivalent of dyspnea remains improved after PCI. He'll continue with his medications and followup with [place] in 6 months. No changes made in his meds today.

## 2011-10-14 NOTE — Progress Notes (Signed)
HPI [name] returns for close followup after PCI to his right coronary artery. His breathing remains improved and he is driving locally. He is on iron per my request to restore iron levels. Is having no angina. His ischemic equivalent was dyspnea.  His meds are being sorted out by his pharmacy. His son is using with him but is not with him today. He seems to be compliant.  Past Medical History  Diagnosis Date  . Hypertension   . Hyperlipidemia   . Adenocarcinoma 2003    COLON  . PVD (peripheral vascular disease)     ABI 0.78 RIGHT AND 0.80 LEFT 4/11  . Osteoarthritis     OF KNEES  . Kidney stone   . Schatzki's ring 08/05/10    egd with Dr. Jena Gauss  . Hiatal hernia 08/05/10    egd with Dr. Jena Gauss  . Esophageal dysmotility   . Microscopic colitis   . Internal hemorrhoids   . Diverticula of colon   . CHF (congestive heart failure)   . Coronary artery stenosis     a. 06/2011 Cath: LM 20, LAD min irregs, D1 95 - small,  LCX 60p, RCA 66m, 50d, PLA 50ost;  b. 06/2011 PCI/Rota  RCA  -> 3.0x58mm Promus Element DES  . Angina   . COPD (chronic obstructive pulmonary disease)   . Asthma   . Pneumonia ~ 2005; 09/2005  . Shortness of breath 07/17/11    "laying down"  . Type II diabetes mellitus   . GERD (gastroesophageal reflux disease)   . Anxiety   . Ischemic cardiomyopathy     a. 05/2011 Echo: EF 45-50%, inf/post HK    Current Outpatient Prescriptions  Medication Sig Dispense Refill  . albuterol (PROAIR HFA) 108 (90 BASE) MCG/ACT inhaler Inhale 2 puffs into the lungs every 6 (six) hours as needed. For shortness of breath      . aspirin EC 81 MG tablet Take 81 mg by mouth daily.      . carvedilol (COREG) 6.25 MG tablet Take 6.25 mg by mouth 2 (two) times daily.      . cholestyramine (QUESTRAN) 4 GM/DOSE powder Take 0.5 packets (2 g total) by mouth daily.  378 g  12  . Cinnamon 500 MG capsule Take 1,000 mg by mouth daily.      . clopidogrel (PLAVIX) 75 MG tablet Take 75 mg by mouth daily.       Marland Kitchen doxazosin (CARDURA) 2 MG tablet Take 1 tablet (2 mg total) by mouth at bedtime.  30 tablet  6  . Ferrous Sulfate (IRON) 325 (65 FE) MG TABS Take 325 mg by mouth 2 (two) times daily.  60 each  0  . fish oil-omega-3 fatty acids 1000 MG capsule Take 1 g by mouth daily.       . furosemide (LASIX) 20 MG tablet Take 20 mg by mouth 2 (two) times daily.       Marland Kitchen HYDROcodone-homatropine (HYCODAN) 5-1.5 MG/5ML syrup Take 5 mLs by mouth every 4 (four) hours as needed. For cough      . loperamide (IMODIUM) 2 MG capsule Take 2 mg by mouth as needed. For runny stools      . LORazepam (ATIVAN) 0.5 MG tablet Take 0.5 mg by mouth every 4 (four) hours as needed. For anxiety       . metFORMIN (GLUCOPHAGE) 500 MG tablet Take 1 tablet (500 mg total) by mouth daily.      . Multiple Vitamins-Minerals (MULTIVITAMIN) tablet Take  1 tablet by mouth daily.  30 tablet  0  . nitroGLYCERIN (NITROSTAT) 0.4 MG SL tablet Place 1 tablet (0.4 mg total) under the tongue every 5 (five) minutes as needed for chest pain.  25 tablet  3  . potassium chloride SA (K-DUR,KLOR-CON) 20 MEQ tablet Take 20 mEq by mouth daily.       Marland Kitchen PROTONIX 40 MG tablet TAKE 1 TABLET BY MOUTH ONCE A DAY.  30 each  11  . pyridostigmine (MESTINON) 60 MG tablet Take 60 mg by mouth 2 (two) times daily.      . simvastatin (ZOCOR) 40 MG tablet Take 40 mg by mouth every evening.        No Known Allergies  Family History  Problem Relation Age of Onset  . Coronary artery disease Father   . Coronary artery disease Brother     CABG  . Coronary artery disease Brother     History   Social History  . Marital Status: Married    Spouse Name: N/A    Number of Children: N/A  . Years of Education: N/A   Occupational History  . Not on file.   Social History Main Topics  . Smoking status: Former Smoker -- 20 years    Types: Cigarettes    Quit date: 02/24/1997  . Smokeless tobacco: Never Used  . Alcohol Use: No  . Drug Use: No  . Sexually Active: Not  Currently   Other Topics Concern  . Not on file   Social History Narrative   Lives in [place]. Married. Wife in [place].Son lives with him    ROS ALL NEGATIVE EXCEPT THOSE NOTED IN HPI  PE  General Appearance: well developed, well nourished in no acute distress, overweight HEENT: symmetrical face, PERRLA,  Neck: no JVD, thyromegaly, or adenopathy, trachea midline Chest: symmetric without deformity Cardiac: PMI non-displaced, RRR, normal S1, S2, no gallop or murmur Lung: clear to ausculation and percussion Vascular: all pulses full without bruits  Abdominal: nondistended, nontender, good bowel sounds, no HSM, no bruits Extremities: no cyanosis, clubbing or edema, no sign of DVT, no varicosities  Skin: normal color, no rashes Neuro: alert and oriented x 3, non-focal Pysch: normal affect  EKG Not repeated BMET    Component Value Date/Time   NA 139 07/18/2011 0420   K 4.4 07/18/2011 0420   CL 103 07/18/2011 0420   CO2 28 07/18/2011 0420   GLUCOSE 108* 07/18/2011 0420   BUN 16 07/18/2011 0420   CREATININE 1.01 07/18/2011 0420   CREATININE 0.85 07/07/2011 1419   CALCIUM 8.8 07/18/2011 0420   GFRNONAA 68* 07/18/2011 0420   GFRAA 79* 07/18/2011 0420    Lipid Panel  No results found for this basename: chol, trig, hdl, cholhdl, vldl, ldlcalc    CBC    Component Value Date/Time   WBC 5.4 08/25/2011 0720   RBC 3.22* 08/25/2011 0720   HGB 10.3* 08/25/2011 0720   HCT 30.4* 08/25/2011 0720   PLT 175 08/25/2011 0720   MCV 94.4 08/25/2011 0720   MCH 32.0 08/25/2011 0720   MCHC 33.9 08/25/2011 0720   RDW 15.8* 08/25/2011 0720   LYMPHSABS 1.4 08/25/2011 0720   MONOABS 0.6 08/25/2011 0720   EOSABS 0.2 08/25/2011 0720   BASOSABS 0.0 08/25/2011 0720

## 2011-10-14 NOTE — Patient Instructions (Addendum)
Your physician recommends that you schedule a follow-up appointment in: 6 months  

## 2011-10-22 ENCOUNTER — Other Ambulatory Visit: Payer: Self-pay | Admitting: Cardiology

## 2011-10-22 MED ORDER — IRON 325 (65 FE) MG PO TABS: 325.0000 mg | ORAL_TABLET | Freq: Two times a day (BID) | ORAL | Status: DC

## 2011-10-22 NOTE — Telephone Encounter (Signed)
PT HAS BEEN ON THIS OVER COUNTER, BUT THEY ARE MAILING TO HIM SO THEY NEED A RX FOR IT.

## 2011-11-11 ENCOUNTER — Encounter: Payer: Self-pay | Admitting: *Deleted

## 2011-11-18 ENCOUNTER — Other Ambulatory Visit: Payer: Self-pay | Admitting: Adult Health

## 2011-12-05 ENCOUNTER — Encounter: Payer: Self-pay | Admitting: Internal Medicine

## 2011-12-05 ENCOUNTER — Ambulatory Visit (INDEPENDENT_AMBULATORY_CARE_PROVIDER_SITE_OTHER): Payer: Medicare Other | Admitting: Internal Medicine

## 2011-12-05 VITALS — BP 125/53 | HR 61 | Temp 97.6°F | Ht 68.0 in | Wt 191.6 lb

## 2011-12-05 DIAGNOSIS — K589 Irritable bowel syndrome without diarrhea: Secondary | ICD-10-CM

## 2011-12-05 NOTE — Patient Instructions (Addendum)
Continue Questran one half scoop daily  See Dr. Renard Matter after 2 PM today to see about coming off metformin.  Office visit here in 6 weeks  Continue protonix 40 mg orally daily

## 2011-12-05 NOTE — Progress Notes (Signed)
Primary Care Physician:  [name], MD Primary Gastroenterologist:  Dr.   Pre-Procedure History & Physical: HPI:  Howard [name] is a 78 y.o. male here for intermittent feculent contents/diarrhea. Howard [name] does very well most of the time he has 1-2 formed bowel movements daily and about once every 2-3 weeks he states he has abdominal cramps strong urge to have a bowel movement and sometimes doesn't make it in time. Again, he does very well the remainder of the time. Taking cholestyramine one half scoop daily as off label treatment of his condition. History of microscopic colitis on almost specific therapy for that and he this time. Doing very well as far as reflux symptoms does with Protonix.  No dysphagia symptoms. Past Medical History  Diagnosis Date  . Hypertension   . Hyperlipidemia   . Adenocarcinoma 2003    COLON  . PVD (peripheral vascular disease)     ABI 0.78 RIGHT AND 0.80 LEFT 4/11  . Osteoarthritis     OF KNEES  . Kidney stone   . Schatzki's ring 08/05/10    egd with Dr. Jena Gauss  . Hiatal hernia 08/05/10    egd with Dr. Jena Gauss  . Esophageal dysmotility   . Microscopic colitis   . Internal hemorrhoids   . Diverticula of colon   . CHF (congestive heart failure)   . Coronary artery stenosis     a. 06/2011 Cath: LM 20, LAD min irregs, D1 95 - small,  LCX 60p, RCA 109m, 50d, PLA 50ost;  b. 06/2011 PCI/Rota  RCA  -> 3.0x44mm Promus Element DES  . Angina   . COPD (chronic obstructive pulmonary disease)   . Asthma   . Pneumonia ~ 2005; 09/2005  . Shortness of breath 07/17/11    "laying down"  . Type II diabetes mellitus   . GERD (gastroesophageal reflux disease)   . Anxiety   . Ischemic cardiomyopathy     a. 05/2011 Echo: EF 45-50%, inf/post HK    Past Surgical History  Procedure Date  . Hemicolectomy 2003  . Appendectomy   . Colonoscopy 04/2001    Dr. Karilyn Cota- colon carcinoma  . Esophagogastroduodenoscopy 04/2001    Dr. Karilyn Cota  . Esophagogastroduodenoscopy 08/05/10   Dr. Rinaldo Ratel ring, hiatal hernia  . Colonoscopy 09/04/09    Dr. Chauncy Passy, hemorrhoids  . Atherectomy 07/17/11  . Coronary angioplasty 07/17/11    rotablator  . Colon surgery   . Cataract extraction, bilateral     Prior to Admission medications   Medication Sig Start Date End Date Taking? Authorizing Provider  albuterol (PROAIR HFA) 108 (90 BASE) MCG/ACT inhaler Inhale 2 puffs into the lungs every 6 (six) hours as needed. For shortness of breath   Yes Historical Provider, MD  aspirin EC 81 MG tablet Take 81 mg by mouth daily.   Yes Historical Provider, MD  cholestyramine Lanetta Inch) 4 GM/DOSE powder Take 0.5 packets (2 g total) by mouth daily. 09/16/11 09/15/12 Yes Nira Retort, NP  Cinnamon 500 MG capsule Take 1,000 mg by mouth daily.   Yes Historical Provider, MD  clopidogrel (PLAVIX) 75 MG tablet Take 75 mg by mouth daily.   Yes Historical Provider, MD  COREG 6.25 MG tablet TAKE 1 TABLET BY MOUTH 2 TIMES DAILY. 11/18/11  Yes Gaylord Shih, MD  doxazosin (CARDURA) 2 MG tablet Take 1 tablet (2 mg total) by mouth at bedtime. 07/11/11 07/10/12 Yes Jodelle Gross, NP  Ferrous Sulfate (IRON) 325 (65 FE) MG TABS Take 325 mg by  mouth 2 (two) times daily. 10/22/11  Yes Gaylord Shih, MD  fish oil-omega-3 fatty acids 1000 MG capsule Take 1 g by mouth daily.    Yes Historical Provider, MD  furosemide (LASIX) 20 MG tablet Take 20 mg by mouth 2 (two) times daily.    Yes Historical Provider, MD  HYDROcodone-homatropine (HYCODAN) 5-1.5 MG/5ML syrup Take 5 mLs by mouth every 4 (four) hours as needed. For cough   Yes Historical Provider, MD  loperamide (IMODIUM) 2 MG capsule Take 2 mg by mouth as needed. For runny stools 05/19/11  Yes Historical Provider, MD  LORazepam (ATIVAN) 0.5 MG tablet Take 0.5 mg by mouth every 4 (four) hours as needed. For anxiety    Yes Historical Provider, MD  metFORMIN (GLUCOPHAGE) 500 MG tablet Take 1 tablet (500 mg total) by mouth daily. 07/18/11  Yes Ok Anis, NP  Multiple Vitamins-Minerals (MULTIVITAMIN) tablet Take 1 tablet by mouth daily. 08/27/11 08/26/12 Yes Gaylord Shih, MD  nitroGLYCERIN (NITROSTAT) 0.4 MG SL tablet Place 1 tablet (0.4 mg total) under the tongue every 5 (five) minutes as needed for chest pain. 07/18/11 07/17/12 Yes Ok Anis, NP  potassium chloride SA (K-DUR,KLOR-CON) 20 MEQ tablet Take 20 mEq by mouth daily.    Yes Historical Provider, MD  PROTONIX 40 MG tablet TAKE 1 TABLET BY MOUTH ONCE A DAY. 08/19/11  Yes Tiffany Kocher, PA  pyridostigmine (MESTINON) 60 MG tablet Take 60 mg by mouth 2 (two) times daily.   Yes Historical Provider, MD  simvastatin (ZOCOR) 40 MG tablet Take 40 mg by mouth every evening.   Yes Historical Provider, MD    Allergies as of 12/05/2011  . (No Known Allergies)    Family History  Problem Relation Age of Onset  . Coronary artery disease Father   . Coronary artery disease Brother     CABG  . Coronary artery disease Brother     History   Social History  . Marital Status: Married    Spouse Name: N/A    Number of Children: N/A  . Years of Education: N/A   Occupational History  . Not on file.   Social History Main Topics  . Smoking status: Former Smoker -- 20 years    Types: Cigarettes    Quit date: 02/24/1997  . Smokeless tobacco: Never Used  . Alcohol Use: No  . Drug Use: No  . Sexually Active: Not Currently   Other Topics Concern  . Not on file   Social History Narrative   Lives in [place]. Married. Wife in [place].Son lives with him    Review of Systems: See HPI, otherwise negative ROS  Physical Exam: BP 125/53  Pulse 61  Temp 97.6 F (36.4 C) (Temporal)  Ht 5\' 8"  (1.727 m)  Wt 191 lb 9.6 oz (86.909 kg)  BMI 29.13 kg/m2 General:   Alert,  Well-developed, well-nourished, pleasant and cooperative in NAD Skin:  Intact without significant lesions or rashes. Eyes:  Sclera clear, no icterus.   Conjunctiva pink. Ears:  Normal auditory acuity. Nose:  No  deformity, discharge,  or lesions. Mouth:  No deformity or lesions. Neck:  Supple; no masses or thyromegaly. No significant cervical adenopathy. Lungs:  Clear throughout to auscultation.   No wheezes, crackles, or rhonchi. No acute distress. Heart:  Regular rate and rhythm; no murmurs, clicks, rubs,  or gallops. Abdomen: Non-distended, normal bowel sounds.  Soft and nontender without appreciable mass or hepatosplenomegaly.  Pulses:  Normal pulses noted.  Extremities:  Without clubbing or edema.  Impression/Plan:  History of microscopic colitis and irritable bowel syndrome-D. Patient is reporting normal bowel function most of the time but he has rare episodes of abdominal cramps and "blow outs" with fecal incontinence. This is obviously very disturbing for this nice gentleman. If he had ongoing colitis, we would expect loose bowels and episodes on a regular, daily basis. I feel he has more Irritable bowel syndrome rather than colitis symptoms. Metformin may also be a contributing factor.  Recommendations: A drug holiday holding metformin. I called Dr. Renard Matter and spoke with him. He will need of other measures for glycemic control. Dr. Renard Matter ask if the patient can stop by his office after 2 PM today to be assessed. He is to continue the off label use of Questran one half scoop daily.  Will plan to see him back in the office in 4 weeks.

## 2011-12-16 ENCOUNTER — Other Ambulatory Visit: Payer: Self-pay

## 2011-12-16 ENCOUNTER — Other Ambulatory Visit: Payer: Self-pay | Admitting: Adult Health

## 2011-12-16 MED ORDER — CLOPIDOGREL BISULFATE 75 MG PO TABS: 75.0000 mg | ORAL_TABLET | Freq: Every day | ORAL | Status: AC

## 2012-01-16 ENCOUNTER — Encounter: Payer: Self-pay | Admitting: Internal Medicine

## 2012-01-16 ENCOUNTER — Ambulatory Visit (INDEPENDENT_AMBULATORY_CARE_PROVIDER_SITE_OTHER): Payer: Medicare Other | Admitting: Internal Medicine

## 2012-01-16 VITALS — BP 134/59 | HR 76 | Temp 97.6°F | Ht 68.0 in | Wt 193.0 lb

## 2012-01-16 DIAGNOSIS — R197 Diarrhea, unspecified: Secondary | ICD-10-CM

## 2012-01-16 DIAGNOSIS — R159 Full incontinence of feces: Secondary | ICD-10-CM

## 2012-01-16 DIAGNOSIS — K219 Gastro-esophageal reflux disease without esophagitis: Secondary | ICD-10-CM

## 2012-01-16 NOTE — Patient Instructions (Signed)
Continue questran 1 pack daily  Continue Protonix  Daily  Office visit in 6 months

## 2012-01-16 NOTE — Progress Notes (Signed)
Primary Care Physician:  [name], MD Primary Gastroenterologist:  Dr. Jena Gauss  Pre-Procedure History & Physical: HPI:  Howard [name] is a 78 y.o. male here for followup of intermittent fecal incontinence and diarrhea. Since Glucophage was stopped, patient has had a marked improvement in bowel function.. Patient having 1-2 bowel movements daily. Take Questran one pack. Reflux symptoms well controlled on Protonix. No history of recurrent dysphagia thus far. History of colon cancer surveillance examination 2000 11:30 be due for followup exam 2016  Past Medical History  Diagnosis Date  . Hypertension   . Hyperlipidemia   . Adenocarcinoma 2003    COLON  . PVD (peripheral vascular disease)     ABI 0.78 RIGHT AND 0.80 LEFT 4/11  . Osteoarthritis     OF KNEES  . Kidney stone   . Schatzki's ring 08/05/10    egd with Dr. Jena Gauss  . Hiatal hernia 08/05/10    egd with Dr. Jena Gauss  . Esophageal dysmotility   . Microscopic colitis   . Internal hemorrhoids   . Diverticula of colon   . CHF (congestive heart failure)   . Coronary artery stenosis     a. 06/2011 Cath: LM 20, LAD min irregs, D1 95 - small,  LCX 60p, RCA 70m, 50d, PLA 50ost;  b. 06/2011 PCI/Rota  RCA  -> 3.0x50mm Promus Element DES  . Angina   . COPD (chronic obstructive pulmonary disease)   . Asthma   . Pneumonia ~ 2005; 09/2005  . Shortness of breath 07/17/11    "laying down"  . Type II diabetes mellitus   . GERD (gastroesophageal reflux disease)   . Anxiety   . Ischemic cardiomyopathy     a. 05/2011 Echo: EF 45-50%, inf/post HK    Past Surgical History  Procedure Date  . Hemicolectomy 2003  . Appendectomy   . Colonoscopy 04/2001    Dr. Karilyn Cota- colon carcinoma  . Esophagogastroduodenoscopy 04/2001    Dr. Karilyn Cota  . Esophagogastroduodenoscopy 08/05/10    Dr. Rinaldo Ratel ring, hiatal hernia  . Colonoscopy 09/04/09    Dr. Chauncy Passy, hemorrhoids  . Atherectomy 07/17/11  . Coronary angioplasty 07/17/11    rotablator  .  Colon surgery   . Cataract extraction, bilateral     Prior to Admission medications   Medication Sig Start Date End Date Taking? Authorizing Provider  albuterol (PROAIR HFA) 108 (90 BASE) MCG/ACT inhaler Inhale 2 puffs into the lungs every 6 (six) hours as needed. For shortness of breath   Yes Historical Provider, MD  aspirin EC 81 MG tablet Take 81 mg by mouth daily.   Yes Historical Provider, MD  cholestyramine Lanetta Inch) 4 GM/DOSE powder Take 0.5 packets (2 g total) by mouth daily. 09/16/11 09/15/12 Yes Nira Retort, NP  Cinnamon 500 MG capsule Take 1,000 mg by mouth daily.   Yes Historical Provider, MD  clopidogrel (PLAVIX) 75 MG tablet Take 1 tablet (75 mg total) by mouth daily. 12/16/11  Yes Kathleene Hazel, MD  COREG 6.25 MG tablet TAKE 1 TABLET BY MOUTH 2 TIMES DAILY. 11/18/11  Yes Gaylord Shih, MD  doxazosin (CARDURA) 2 MG tablet TAKE ONE TABLET BY MOUTH AT BEDTIME. 12/16/11  Yes Jodelle Gross, NP  Ferrous Sulfate (IRON) 325 (65 FE) MG TABS Take 325 mg by mouth 2 (two) times daily. 10/22/11  Yes Gaylord Shih, MD  fish oil-omega-3 fatty acids 1000 MG capsule Take 1 g by mouth daily.    Yes Historical Provider, MD  furosemide (LASIX)  20 MG tablet Take 20 mg by mouth 2 (two) times daily.    Yes Historical Provider, MD  GLIPIZIDE XL 5 MG 24 hr tablet Take 5 mg by mouth daily.  01/13/12  Yes Historical Provider, MD  HYDROcodone-homatropine (HYCODAN) 5-1.5 MG/5ML syrup Take 5 mLs by mouth every 4 (four) hours as needed. For cough   Yes Historical Provider, MD  loperamide (IMODIUM) 2 MG capsule Take 2 mg by mouth as needed. For runny stools 05/19/11  Yes Historical Provider, MD  LORazepam (ATIVAN) 0.5 MG tablet Take 0.5 mg by mouth every 4 (four) hours as needed. For anxiety    Yes Historical Provider, MD  Multiple Vitamins-Minerals (MULTIVITAMIN) tablet Take 1 tablet by mouth daily. 08/27/11 08/26/12 Yes Gaylord Shih, MD  nitroGLYCERIN (NITROSTAT) 0.4 MG SL tablet Place 1 tablet (0.4 mg  total) under the tongue every 5 (five) minutes as needed for chest pain. 07/18/11 07/17/12 Yes Ok Anis, NP  potassium chloride SA (K-DUR,KLOR-CON) 20 MEQ tablet Take 20 mEq by mouth daily.    Yes Historical Provider, MD  PROTONIX 40 MG tablet TAKE 1 TABLET BY MOUTH ONCE A DAY. 08/19/11  Yes Tiffany Kocher, PA  pyridostigmine (MESTINON) 60 MG tablet Take 60 mg by mouth 2 (two) times daily.   Yes Historical Provider, MD  simvastatin (ZOCOR) 40 MG tablet Take 40 mg by mouth every evening.   Yes Historical Provider, MD  metFORMIN (GLUCOPHAGE) 500 MG tablet Take 1 tablet (500 mg total) by mouth daily. 07/18/11   Ok Anis, NP    Allergies as of 01/16/2012  . (No Known Allergies)    Family History  Problem Relation Age of Onset  . Coronary artery disease Father   . Coronary artery disease Brother     CABG  . Coronary artery disease Brother     History   Social History  . Marital Status: Married    Spouse Name: N/A    Number of Children: N/A  . Years of Education: N/A   Occupational History  . Not on file.   Social History Main Topics  . Smoking status: Former Smoker -- 20 years    Types: Cigarettes    Quit date: 02/24/1997  . Smokeless tobacco: Never Used  . Alcohol Use: No  . Drug Use: No  . Sexually Active: Not Currently   Other Topics Concern  . Not on file   Social History Narrative   Lives in [place]. Mar[place]. Wife in [place].Son lives with him    Review of Systems: See HPI, otherwise negative ROS  Physical Exam: BP 134/59  Pulse 76  Temp 97.6 F (36.4 C) (Temporal)  Ht 5\' 8"  (1.727 m)  Wt 193 lb (87.544 kg)  BMI 29.35 kg/m2 General:   Elderly, somewhat frail gentleman ;pleasant and cooperative in NAD Skin:  Intact without significant lesions or rashes. Eyes:  Sclera clear, no icterus.   Conjunctiva pink. Ears:  Normal auditory acuity. Nose:  No deformity, discharge,  or lesions. Mouth:  No deformity or lesions. Neck:  Supple; no masses  or thyromegaly. No significant cervical adenopathy. Lungs:  Clear throughout to auscultation.   No wheezes, crackles, or rhonchi. No acute distress. Heart:  Regular rate and rhythm; with 1-2/6 systolic ejection murmur ;no murmurs, clicks, rubs,  or gallops. Abdomen: Non-distended, normal bowel sounds.  Soft and nontender without appreciable mass or hepatosplenomegaly.  Pulses:  Normal pulses noted. Extremities:  Without clubbing or edema.  Impression/Plan:  78 year old gentleman with  intermittent fecal incontinence and diarrhea much improved since cessation of Glucophage each. Cholestyramine appear to be making a positive difference as well. Reflux symptoms well controlled on Protonix.   Recommendations: Continue present regimen of cholestyramine and protonix.. Unless something comes up, plan to see this nice gentleman back in 6 months.

## 2012-02-24 ENCOUNTER — Telehealth: Payer: Self-pay | Admitting: Urgent Care

## 2012-02-24 NOTE — Telephone Encounter (Signed)
Dr Renard Matter' office and patient is aware of OV on 02/26/12 at 1130 with AS

## 2012-02-24 NOTE — Telephone Encounter (Signed)
Phone call from Dr Renard Matter He would like pt seen for melena & anemia this week Darl Pikes informed to schedule urgent OV Thanks

## 2012-02-26 ENCOUNTER — Encounter: Payer: Self-pay | Admitting: Gastroenterology

## 2012-02-26 ENCOUNTER — Ambulatory Visit (INDEPENDENT_AMBULATORY_CARE_PROVIDER_SITE_OTHER): Payer: Medicare Other | Admitting: Gastroenterology

## 2012-02-26 VITALS — BP 112/47 | HR 70 | Temp 98.0°F | Ht 64.0 in | Wt 192.6 lb

## 2012-02-26 DIAGNOSIS — K921 Melena: Secondary | ICD-10-CM

## 2012-02-26 DIAGNOSIS — D649 Anemia, unspecified: Secondary | ICD-10-CM

## 2012-02-26 NOTE — Patient Instructions (Addendum)
We have set you up for an upper endoscopy with Dr. Jena Gauss in the near future.  You may need further procedures in the future, but I would like to get this done first.  Seek medical attention if large amounts of black, tarry stool, weakness, fatigue, severe abdominal pain, throwing up blood.

## 2012-02-26 NOTE — Progress Notes (Signed)
Referring Provider: McInnis, Angus G, MD Primary Care Physician:  MCINNIS,ANGUS G, MD Primary Gastroenterologist: Dr. Rourk    Chief Complaint  Patient presents with  . Anemia    HPI:   79-year-old male with hx of microscopic colitis and IBS-D, last seen late Nov 2013 by Dr. Rourk for fecal incontinence and diarrhea. At that time, he had noted improvement after stopping Glucophage and using Questran. Last TCS in July 2011 by Dr. Rourk with diverticula and hemorrhoids; due for surveillance secondary to colon adenocarcinoma in 2016. Last EGD in June 2012 with Schatzki's ring and hiatal hernia.   Presents today at the request of Dr. McInnis with concerns for new-onset melena, Hgb 8.9, heme + stool. Last Hgb in epic 10.27 August 2011. Iron low at 21. Ferritin 96, normal.      Went to doctor last Friday, then Mon and Tues for abdominal cramping, diarrhea, nausea.Prescribed Bentyl last Monday for lower abdominal cramping, which has now improved.  Diarrhea resolved. Nausea improved, intermittent. Cramping improved. Upon review of chart, appears issues with cramping in past; this is not a new finding. Pt notes melena for past few days. No bright red blood. No pain with eating. Feels weak.    Past Medical History  Diagnosis Date  . Hypertension   . Hyperlipidemia   . Adenocarcinoma 2003    COLON  . PVD (peripheral vascular disease)     ABI 0.78 RIGHT AND 0.80 LEFT 4/11  . Osteoarthritis     OF KNEES  . Kidney stone   . Schatzki's ring 08/05/10    egd with Dr. Rourk  . Hiatal hernia 08/05/10    egd with Dr. Rourk  . Esophageal dysmotility   . Microscopic colitis   . Internal hemorrhoids   . Diverticula of colon   . CHF (congestive heart failure)   . Coronary artery stenosis     a. 06/2011 Cath: LM 20, LAD min irregs, D1 95 - small,  LCX 60p, RCA 99m, 50d, PLA 50ost;  b. 06/2011 PCI/Rota  RCA  -> 3.0x24mm Promus Element DES  . Angina   . COPD (chronic obstructive pulmonary disease)   . Asthma    . Pneumonia ~ 2005; 09/2005  . Shortness of breath 07/17/11    "laying down"  . Type II diabetes mellitus   . GERD (gastroesophageal reflux disease)   . Anxiety   . Ischemic cardiomyopathy     a. 05/2011 Echo: EF 45-50%, inf/post HK    Past Surgical History  Procedure Date  . Hemicolectomy 2003  . Appendectomy   . Colonoscopy 04/2001    Dr. Rehman- colon carcinoma  . Esophagogastroduodenoscopy 04/2001    Dr. Rehman  . Esophagogastroduodenoscopy 08/05/10    Dr. Rourk-schatzki ring, hiatal hernia  . Colonoscopy 09/04/09    Dr. Rourk-diverticula, hemorrhoids  . Atherectomy 07/17/11  . Coronary angioplasty 07/17/11    rotablator  . Colon surgery   . Cataract extraction, bilateral     Current Outpatient Prescriptions  Medication Sig Dispense Refill  . albuterol (PROAIR HFA) 108 (90 BASE) MCG/ACT inhaler Inhale 2 puffs into the lungs every 6 (six) hours as needed. For shortness of breath      . aspirin EC 81 MG tablet Take 81 mg by mouth daily.      . cholestyramine (QUESTRAN) 4 GM/DOSE powder Take 0.5 packets (2 g total) by mouth daily.  378 g  12  . Cinnamon 500 MG capsule Take 1,000 mg by mouth daily.      .   clopidogrel (PLAVIX) 75 MG tablet Take 1 tablet (75 mg total) by mouth daily.  90 tablet  3  . COREG 6.25 MG tablet TAKE 1 TABLET BY MOUTH 2 TIMES DAILY.  180 tablet  3  . dicyclomine (BENTYL) 10 MG capsule Take 10 mg by mouth 4 (four) times daily -  before meals and at bedtime.       . doxazosin (CARDURA) 2 MG tablet TAKE ONE TABLET BY MOUTH AT BEDTIME.  30 tablet  6  . Ferrous Sulfate (IRON) 325 (65 FE) MG TABS Take 325 mg by mouth 2 (two) times daily.  60 each  0  . fish oil-omega-3 fatty acids 1000 MG capsule Take 1 g by mouth daily.       . furosemide (LASIX) 20 MG tablet Take 20 mg by mouth 2 (two) times daily.       . GLIPIZIDE XL 5 MG 24 hr tablet Take 5 mg by mouth daily.       . HYDROcodone-homatropine (HYCODAN) 5-1.5 MG/5ML syrup Take 5 mLs by mouth every 4 (four)  hours as needed. For cough      . loperamide (IMODIUM) 2 MG capsule Take 2 mg by mouth as needed. For runny stools      . LORazepam (ATIVAN) 0.5 MG tablet Take 0.5 mg by mouth every 4 (four) hours as needed. For anxiety       . metFORMIN (GLUCOPHAGE) 500 MG tablet Take 1 tablet (500 mg total) by mouth daily.      . Multiple Vitamins-Minerals (MULTIVITAMIN) tablet Take 1 tablet by mouth daily.  30 tablet  0  . nitroGLYCERIN (NITROSTAT) 0.4 MG SL tablet Place 1 tablet (0.4 mg total) under the tongue every 5 (five) minutes as needed for chest pain.  25 tablet  3  . ondansetron (ZOFRAN) 4 MG tablet Take 4 mg by mouth every 8 (eight) hours as needed.      . potassium chloride SA (K-DUR,KLOR-CON) 20 MEQ tablet Take 20 mEq by mouth daily.       . PROTONIX 40 MG tablet TAKE 1 TABLET BY MOUTH ONCE A DAY.  30 each  11  . pyridostigmine (MESTINON) 60 MG tablet Take 60 mg by mouth 2 (two) times daily.      . simvastatin (ZOCOR) 40 MG tablet Take 40 mg by mouth every evening.        Allergies as of 02/26/2012  . (No Known Allergies)    Family History  Problem Relation Age of Onset  . Coronary artery disease Father   . Coronary artery disease Brother     CABG  . Coronary artery disease Brother     History   Social History  . Marital Status: Married    Spouse Name: N/A    Number of Children: N/A  . Years of Education: N/A   Social History Main Topics  . Smoking status: Former Smoker -- 20 years    Types: Cigarettes    Quit date: 02/24/1997  . Smokeless tobacco: Never Used  . Alcohol Use: No  . Drug Use: No  . Sexually Active: Not Currently   Other Topics Concern  . None   Social History Narrative   Lives in [place]. Married. Wife in [place].Son lives with him    Review of Systems: Negative unless otherwise mentioned in HPI.   Physical Exam: BP 112/47  Pulse 70  Temp 98 F (36.7 C) (Oral)  Ht 5' 4" (1.626 m)  Wt 192 lb 9.6   oz (87.363 kg)  BMI 33.06 kg/m2 General:   Alert and  oriented. No distress noted. Pleasant and cooperative.  Head:  Normocephalic and atraumatic. Eyes:  Conjuctiva clear without scleral icterus. Neck:  Supple, without mass or thyromegaly. Heart:  S1, S2 present without murmurs, rubs, or gallops. Regular rate and rhythm. Abdomen:  +BS, soft, non-tender and non-distended. No rebound or guarding. No HSM or masses noted. Likely ventral hernia noted.  Msk:  Symmetrical without gross deformities. Normal posture. Extremities:  Without edema. Neurologic:  Alert and  oriented x4;  grossly normal neurologically. Skin:  Intact without significant lesions or rashes. Cervical Nodes:  No significant cervical adenopathy. Psych:  Alert and cooperative. Normal mood and affect.  

## 2012-02-27 ENCOUNTER — Encounter (HOSPITAL_COMMUNITY): Payer: Self-pay | Admitting: Pharmacy Technician

## 2012-02-27 DIAGNOSIS — K921 Melena: Secondary | ICD-10-CM | POA: Insufficient documentation

## 2012-02-27 NOTE — Assessment & Plan Note (Signed)
?  multifactorial. EGD in near future.

## 2012-02-27 NOTE — Assessment & Plan Note (Signed)
79 year old male with new-onset melena, heme + stool. Drop in Hgb from 10.3 in July to now 8.9. Iron low at 21 but ferritin normal at 96. Episode of nausea, abdominal cramping, diarrhea last week now resolved, question self-limiting process and not likely related to current issue. No epigastric pain noted, but he does feel weak. He is on Plavix, which can contribute to occult GI bleeding. Anemia appears multifactorial. He does have a remote hx of colonic adenocarcinoma, with last TCS in July 2011. Due for surveillance in 2016. At this point, hold off on lower GI evaluation as he has no evidence of hematochezia; likely upper GI source. However, with his hx, I did explain to the patient that he may need a colonoscopy sooner than 2016. Pt and son both agreeable.   Proceed with upper endoscopy in the near future with Dr. Jena Gauss. The risks, benefits, and alternatives have been discussed in detail with patient. They have stated understanding and desire to proceed.

## 2012-03-01 ENCOUNTER — Encounter (HOSPITAL_COMMUNITY)
Admission: RE | Disposition: A | Discharge status: Home / Self Care | Payer: Self-pay | Source: Ambulatory Visit | Attending: Internal Medicine

## 2012-03-01 ENCOUNTER — Encounter (HOSPITAL_COMMUNITY): Payer: Self-pay

## 2012-03-01 ENCOUNTER — Ambulatory Visit (HOSPITAL_COMMUNITY)
Admission: RE | Admit: 2012-03-01 | Discharge: 2012-03-01 | Disposition: A | Discharge status: Home / Self Care | Payer: Medicare Other | Source: Ambulatory Visit | Attending: Internal Medicine | Admitting: Internal Medicine

## 2012-03-01 DIAGNOSIS — K921 Melena: Secondary | ICD-10-CM

## 2012-03-01 DIAGNOSIS — K449 Diaphragmatic hernia without obstruction or gangrene: Secondary | ICD-10-CM | POA: Insufficient documentation

## 2012-03-01 DIAGNOSIS — Z01812 Encounter for preprocedural laboratory examination: Secondary | ICD-10-CM | POA: Insufficient documentation

## 2012-03-01 DIAGNOSIS — Z85038 Personal history of other malignant neoplasm of large intestine: Secondary | ICD-10-CM | POA: Insufficient documentation

## 2012-03-01 DIAGNOSIS — K2940 Chronic atrophic gastritis without bleeding: Secondary | ICD-10-CM | POA: Insufficient documentation

## 2012-03-01 DIAGNOSIS — E119 Type 2 diabetes mellitus without complications: Secondary | ICD-10-CM | POA: Insufficient documentation

## 2012-03-01 DIAGNOSIS — A048 Other specified bacterial intestinal infections: Secondary | ICD-10-CM

## 2012-03-01 DIAGNOSIS — I10 Essential (primary) hypertension: Secondary | ICD-10-CM | POA: Insufficient documentation

## 2012-03-01 DIAGNOSIS — D649 Anemia, unspecified: Secondary | ICD-10-CM

## 2012-03-01 DIAGNOSIS — K2960 Other gastritis without bleeding: Secondary | ICD-10-CM

## 2012-03-01 HISTORY — PX: ESOPHAGOGASTRODUODENOSCOPY: SHX5428

## 2012-03-01 HISTORY — DX: Other specified bacterial intestinal infections: A04.8

## 2012-03-01 LAB — CBC WITH DIFFERENTIAL/PLATELET
Basophils Absolute: 0 10*3/uL (ref 0.0–0.1)
Basophils Relative: 0 % (ref 0–1)
HCT: 30.7 % — ABNORMAL LOW (ref 39.0–52.0)
MCHC: 32.9 g/dL (ref 30.0–36.0)
Monocytes Absolute: 1.1 10*3/uL — ABNORMAL HIGH (ref 0.1–1.0)
Neutro Abs: 5 10*3/uL (ref 1.7–7.7)
Neutrophils Relative %: 65 % (ref 43–77)
Platelets: 244 10*3/uL (ref 150–400)
RDW: 14.8 % (ref 11.5–15.5)
WBC: 7.6 10*3/uL (ref 4.0–10.5)

## 2012-03-01 SURGERY — EGD (ESOPHAGOGASTRODUODENOSCOPY)
Anesthesia: Moderate Sedation

## 2012-03-01 MED ORDER — ONDANSETRON HCL 4 MG/2ML IJ SOLN
INTRAMUSCULAR | Status: DC | PRN
  Administered 2012-03-01: 4 mg via INTRAVENOUS

## 2012-03-01 MED ORDER — SODIUM CHLORIDE 0.45 % IV SOLN
INTRAVENOUS | Status: DC
Start: 2012-03-01 — End: 2012-03-01
  Administered 2012-03-01: 13:00:00 via INTRAVENOUS

## 2012-03-01 MED ORDER — SIMETHICONE 40 MG/0.6ML PO SUSP
ORAL | Status: DC | PRN
  Administered 2012-03-01: 14:00:00

## 2012-03-01 MED ORDER — MIDAZOLAM HCL 5 MG/5ML IJ SOLN
INTRAMUSCULAR | Status: AC
  Filled 2012-03-01: qty 10

## 2012-03-01 MED ORDER — MEPERIDINE HCL 100 MG/ML IJ SOLN
INTRAMUSCULAR | Status: DC | PRN
  Administered 2012-03-01 (×2): 25 mg via INTRAVENOUS

## 2012-03-01 MED ORDER — MIDAZOLAM HCL 5 MG/5ML IJ SOLN
INTRAMUSCULAR | Status: DC | PRN
  Administered 2012-03-01: 2 mg via INTRAVENOUS
  Administered 2012-03-01: 1 mg via INTRAVENOUS

## 2012-03-01 MED ORDER — BUTAMBEN-TETRACAINE-BENZOCAINE 2-2-14 % EX AERO
INHALATION_SPRAY | CUTANEOUS | Status: DC | PRN
  Administered 2012-03-01: 2 via TOPICAL

## 2012-03-01 MED ORDER — MEPERIDINE HCL 100 MG/ML IJ SOLN
INTRAMUSCULAR | Status: AC
  Filled 2012-03-01: qty 1

## 2012-03-01 MED ORDER — ONDANSETRON HCL 4 MG/2ML IJ SOLN
INTRAMUSCULAR | Status: AC
  Filled 2012-03-01: qty 2

## 2012-03-01 NOTE — H&P (View-Only) (Signed)
Referring Provider: [name], MD Primary Care Physician:  [name], MD Primary Gastroenterologist: Dr. Jena Gauss    Chief Complaint  Patient presents with  . Anemia    HPI:   79 year old male with hx of microscopic colitis and IBS-D, last seen late Nov 2013 by Dr. Jena Gauss for fecal incontinence and diarrhea. At that time, he had noted improvement after stopping Glucophage and using Questran. Last TCS in July 2011 by Dr. Jena Gauss with diverticula and hemorrhoids; due for surveillance secondary to colon adenocarcinoma in 2016. Last EGD in June 2012 with Schatzki's ring and hiatal hernia.   Presents today at the request of Dr. Renard Matter with concerns for new-onset melena, Hgb 8.9, heme + stool. Last Hgb in epic 10.27 August 2011. Iron low at 21. Ferritin 96, normal.      Went to doctor last Friday, then [place] and Tues for abdominal cramping, diarrhea, nausea.Prescribed Bentyl last Monday for lower abdominal cramping, which has now improved.  Diarrhea resolved. Nausea improved, intermittent. Cramping improved. Upon review of chart, appears issues with cramping in past; this is not a new finding. Pt notes melena for past few days. No bright red blood. No pain with eating. Feels weak.    Past Medical History  Diagnosis Date  . Hypertension   . Hyperlipidemia   . Adenocarcinoma 2003    COLON  . PVD (peripheral vascular disease)     ABI 0.78 RIGHT AND 0.80 LEFT 4/11  . Osteoarthritis     OF KNEES  . Kidney stone   . Schatzki's ring 08/05/10    egd with Dr. Jena Gauss  . Hiatal hernia 08/05/10    egd with Dr. Jena Gauss  . Esophageal dysmotility   . Microscopic colitis   . Internal hemorrhoids   . Diverticula of colon   . CHF (congestive heart failure)   . Coronary artery stenosis     a. 06/2011 Cath: LM 20, LAD min irregs, D1 95 - small,  LCX 60p, RCA 65m, 50d, PLA 50ost;  b. 06/2011 PCI/Rota  RCA  -> 3.0x65mm Promus Element DES  . Angina   . COPD (chronic obstructive pulmonary disease)   . Asthma    . Pneumonia ~ 2005; 09/2005  . Shortness of breath 07/17/11    "laying down"  . Type II diabetes mellitus   . GERD (gastroesophageal reflux disease)   . Anxiety   . Ischemic cardiomyopathy     a. 05/2011 Echo: EF 45-50%, inf/post HK    Past Surgical History  Procedure Date  . Hemicolectomy 2003  . Appendectomy   . Colonoscopy 04/2001    Dr. Karilyn Cota- colon carcinoma  . Esophagogastroduodenoscopy 04/2001    Dr. Karilyn Cota  . Esophagogastroduodenoscopy 08/05/10    Dr. Rinaldo Ratel ring, hiatal hernia  . Colonoscopy 09/04/09    Dr. Chauncy Passy, hemorrhoids  . Atherectomy 07/17/11  . Coronary angioplasty 07/17/11    rotablator  . Colon surgery   . Cataract extraction, bilateral     Current Outpatient Prescriptions  Medication Sig Dispense Refill  . albuterol (PROAIR HFA) 108 (90 BASE) MCG/ACT inhaler Inhale 2 puffs into the lungs every 6 (six) hours as needed. For shortness of breath      . aspirin EC 81 MG tablet Take 81 mg by mouth daily.      . cholestyramine (QUESTRAN) 4 GM/DOSE powder Take 0.5 packets (2 g total) by mouth daily.  378 g  12  . Cinnamon 500 MG capsule Take 1,000 mg by mouth daily.      Marland Kitchen  clopidogrel (PLAVIX) 75 MG tablet Take 1 tablet (75 mg total) by mouth daily.  90 tablet  3  . COREG 6.25 MG tablet TAKE 1 TABLET BY MOUTH 2 TIMES DAILY.  180 tablet  3  . dicyclomine (BENTYL) 10 MG capsule Take 10 mg by mouth 4 (four) times daily -  before meals and at bedtime.       Marland Kitchen doxazosin (CARDURA) 2 MG tablet TAKE ONE TABLET BY MOUTH AT BEDTIME.  30 tablet  6  . Ferrous Sulfate (IRON) 325 (65 FE) MG TABS Take 325 mg by mouth 2 (two) times daily.  60 each  0  . fish oil-omega-3 fatty acids 1000 MG capsule Take 1 g by mouth daily.       . furosemide (LASIX) 20 MG tablet Take 20 mg by mouth 2 (two) times daily.       Marland Kitchen GLIPIZIDE XL 5 MG 24 hr tablet Take 5 mg by mouth daily.       Marland Kitchen HYDROcodone-homatropine (HYCODAN) 5-1.5 MG/5ML syrup Take 5 mLs by mouth every 4 (four)  hours as needed. For cough      . loperamide (IMODIUM) 2 MG capsule Take 2 mg by mouth as needed. For runny stools      . LORazepam (ATIVAN) 0.5 MG tablet Take 0.5 mg by mouth every 4 (four) hours as needed. For anxiety       . metFORMIN (GLUCOPHAGE) 500 MG tablet Take 1 tablet (500 mg total) by mouth daily.      . Multiple Vitamins-Minerals (MULTIVITAMIN) tablet Take 1 tablet by mouth daily.  30 tablet  0  . nitroGLYCERIN (NITROSTAT) 0.4 MG SL tablet Place 1 tablet (0.4 mg total) under the tongue every 5 (five) minutes as needed for chest pain.  25 tablet  3  . ondansetron (ZOFRAN) 4 MG tablet Take 4 mg by mouth every 8 (eight) hours as needed.      . potassium chloride SA (K-DUR,KLOR-CON) 20 MEQ tablet Take 20 mEq by mouth daily.       Marland Kitchen PROTONIX 40 MG tablet TAKE 1 TABLET BY MOUTH ONCE A DAY.  30 each  11  . pyridostigmine (MESTINON) 60 MG tablet Take 60 mg by mouth 2 (two) times daily.      . simvastatin (ZOCOR) 40 MG tablet Take 40 mg by mouth every evening.        Allergies as of 02/26/2012  . (No Known Allergies)    Family History  Problem Relation Age of Onset  . Coronary artery disease Father   . Coronary artery disease Brother     CABG  . Coronary artery disease Brother     History   Social History  . Marital Status: Married    Spouse Name: N/A    Number of Children: N/A  . Years of Education: N/A   Social History Main Topics  . Smoking status: Former Smoker -- 20 years    Types: Cigarettes    Quit date: 02/24/1997  . Smokeless tobacco: Never Used  . Alcohol Use: No  . Drug Use: No  . Sexually Active: Not Currently   Other Topics Concern  . None   Social History Narrative   Lives in [place]. Married. Wife in [place].Son lives with him    Review of Systems: Negative unless otherwise mentioned in HPI.   Physical Exam: BP 112/47  Pulse 70  Temp 98 F (36.7 C) (Oral)  Ht 5\' 4"  (1.626 m)  Wt 192 lb 9.6  oz (87.363 kg)  BMI 33.06 kg/m2 General:   Alert and  oriented. No distress noted. Pleasant and cooperative.  Head:  Normocephalic and atraumatic. Eyes:  Conjuctiva clear without scleral icterus. Neck:  Supple, without mass or thyromegaly. Heart:  S1, S2 present without murmurs, rubs, or gallops. Regular rate and rhythm. Abdomen:  +BS, soft, non-tender and non-distended. No rebound or guarding. No HSM or masses noted. Likely ventral hernia noted.  Msk:  Symmetrical without gross deformities. Normal posture. Extremities:  Without edema. Neurologic:  Alert and  oriented x4;  grossly normal neurologically. Skin:  Intact without significant lesions or rashes. Cervical Nodes:  No significant cervical adenopathy. Psych:  Alert and cooperative. Normal mood and affect.

## 2012-03-01 NOTE — Op Note (Signed)
[facility] [address] [place], [address]   ENDOSCOPY PROCEDURE REPORT  PATIENT: Howard, [name]  MR#: [id number] BIRTHDATE: 08/17/1929 , 82  yrs. old GENDER: Male ENDOSCOPIST: R.  Roetta Sessions, MD FACP FACG REFERRED BY:  Butch Penny, M.D. PROCEDURE DATE:  03/01/2012 PROCEDURE:     EGD with gastric biopsy  INDICATIONS:     Recent episode of melena- self limiting. On Plavix. Hemoglobin in the 8 range recently; was 10.3 in July of 2013.; Repeat H&H today 10.1 and 30.7; Hemoccult positive. History of colon cancer last colonoscopy nearly 4 years ago.  INFORMED CONSENT:   The risks, benefits, limitations, alternatives and imponderables have been discussed.  The potential for biopsy, esophogeal dilation, etc. have also been reviewed.  Questions have been answered.  All parties agreeable.  Please see the history and physical in the medical record for more information.  MEDICATIONS:   Versed 2 mg IV and Demerol 50 mg IV in divided doses.  DESCRIPTION OF PROCEDURE:   The Pentax Gastroscope [id number] endoscope was introduced through the mouth and advanced to the second portion of the duodenum without difficulty or limitations. The mucosal surfaces were surveyed very carefully during advancement of the scope and upon withdrawal.  Retroflexion view of the proximal stomach and esophagogastric junction was performed.      FINDINGS: Normal esophagus. Stomach empty. Small hiatal hernia. Pyloric channel and antral erosions; no ulcer or infiltrating process. Normal first and second portion of the duodenum.  THERAPEUTIC / DIAGNOSTIC MANEUVERS PERFORMED:  Biopsies of the eroded gastric mucosa taken to check for H. pylori.   COMPLICATIONS:  None  IMPRESSION:  Gastric erosions of uncertain significance-status post biopsy. Small hiatal hernia.  RECOMMENDATIONS: Continue Plavix. Continue Protonix 40 mg orally daily. Followup on pathology. I suspect we will bring the  patient back in the near future for a colonoscopy given his last exam was a good 4 years ago and he has a history of colon cancer. A bleeding lesion in the small bowel is not excluded either at  this point in time. Further recommendations to follow.    _______________________________ R. Roetta Sessions, MD FACP [facility] eSigned:  R. Roetta Sessions, MD FACP [facility] 03/01/2012 2:07 PM     CC:

## 2012-03-01 NOTE — Interval H&P Note (Signed)
History and Physical Interval Note:  03/01/2012 1:42 PM  Howard [name]  has presented today for surgery, with the diagnosis of Melena and Anemia  The various methods of treatment have been discussed with the patient and family. After consideration of risks, benefits and other options for treatment, the patient has consented to  Procedure(s) (LRB) with comments: ESOPHAGOGASTRODUODENOSCOPY (EGD) (N/A) - 1:15 as a surgical intervention .  The patient's history has been reviewed, patient examined, no change in status, stable for surgery.  I have reviewed the patient's chart and labs.  Questions were answered to the patient's satisfaction.     Howard [name]  As above. Patient has recurrent dysphagia. So, plan will be diagnostic EGD today.The risks, benefits, limitations, alternatives and imponderables have been reviewed with the patient. Potential for esophageal dilation, biopsy, etc. have also been reviewed.  Questions have been answered. All parties agreeable.

## 2012-03-01 NOTE — Progress Notes (Signed)
Faxed to PCP

## 2012-03-02 LAB — GLUCOSE, CAPILLARY: Glucose-Capillary: 90 mg/dL (ref 70–99)

## 2012-03-04 ENCOUNTER — Other Ambulatory Visit: Payer: Self-pay | Admitting: Internal Medicine

## 2012-03-04 ENCOUNTER — Encounter (HOSPITAL_COMMUNITY): Payer: Self-pay | Admitting: Internal Medicine

## 2012-03-04 ENCOUNTER — Encounter: Payer: Self-pay | Admitting: *Deleted

## 2012-03-04 ENCOUNTER — Encounter: Payer: Self-pay | Admitting: Internal Medicine

## 2012-03-04 ENCOUNTER — Telehealth: Payer: Self-pay

## 2012-03-04 DIAGNOSIS — R195 Other fecal abnormalities: Secondary | ICD-10-CM

## 2012-03-04 MED ORDER — PEG 3350-KCL-NA BICARB-NACL 420 G PO SOLR: 4000.0000 mL | ORAL | Status: DC

## 2012-03-04 NOTE — Telephone Encounter (Signed)
Pt is aware. rx called to [facility] (spoke to Collin)because they prepackage his medications. Spoke with pharmacist and they stated they would take care of getting medication to pt and making sure they remove the protonix and the zocor form his packaged medications.

## 2012-03-04 NOTE — Telephone Encounter (Signed)
I Have patient scheduled for 01/16 with RMR when patient calls back Ill confirm date & time

## 2012-03-04 NOTE — Telephone Encounter (Signed)
Patient claims he cant do trilyte because it makes him sick he cant do it can we get a different prep for him please advise?

## 2012-03-04 NOTE — Telephone Encounter (Signed)
Path and letter faxed to PCP, letter mailed to pt

## 2012-03-04 NOTE — Telephone Encounter (Signed)
Pt requested that I call back late this afternoon and inform his son of recommendations. Tried to call son- NA

## 2012-03-04 NOTE — Telephone Encounter (Signed)
Tried to call pt- Howard [name], please send letter and cc pcp Benedetto Goad, pt needs a tcs please.

## 2012-03-04 NOTE — Telephone Encounter (Signed)
   Letter from: Corbin Ade   Reason for Letter: Results Review   Send letter to patient.  Send copy of letter with path to referring provider and PCP.  Needs treatment for H. pylori, Lenore Cordia or generic equivalent x14 days. Hold for protonix and his Zocor while on treatment; then resume Need to get ahead and schedule colonoscopy this month for Hemoccult positive stool

## 2012-03-08 ENCOUNTER — Other Ambulatory Visit: Payer: Self-pay | Admitting: Internal Medicine

## 2012-03-08 MED ORDER — PEG 3350-KCL-NA BICARB-NACL 420 G PO SOLR: 4000.0000 mL | ORAL | Status: DC

## 2012-03-11 ENCOUNTER — Encounter (HOSPITAL_COMMUNITY)
Admission: RE | Disposition: A | Discharge status: Home / Self Care | Payer: Self-pay | Source: Ambulatory Visit | Attending: Internal Medicine

## 2012-03-11 ENCOUNTER — Ambulatory Visit (HOSPITAL_COMMUNITY)
Admission: RE | Admit: 2012-03-11 | Discharge: 2012-03-11 | Disposition: A | Discharge status: Home / Self Care | Payer: Medicare Other | Source: Ambulatory Visit | Attending: Internal Medicine | Admitting: Internal Medicine

## 2012-03-11 ENCOUNTER — Encounter (HOSPITAL_COMMUNITY): Payer: Self-pay | Admitting: *Deleted

## 2012-03-11 DIAGNOSIS — K921 Melena: Secondary | ICD-10-CM | POA: Insufficient documentation

## 2012-03-11 DIAGNOSIS — Z9049 Acquired absence of other specified parts of digestive tract: Secondary | ICD-10-CM | POA: Insufficient documentation

## 2012-03-11 DIAGNOSIS — J4489 Other specified chronic obstructive pulmonary disease: Secondary | ICD-10-CM | POA: Insufficient documentation

## 2012-03-11 DIAGNOSIS — R195 Other fecal abnormalities: Secondary | ICD-10-CM

## 2012-03-11 DIAGNOSIS — K620 Anal polyp: Secondary | ICD-10-CM

## 2012-03-11 DIAGNOSIS — D128 Benign neoplasm of rectum: Secondary | ICD-10-CM | POA: Insufficient documentation

## 2012-03-11 DIAGNOSIS — Z01812 Encounter for preprocedural laboratory examination: Secondary | ICD-10-CM | POA: Insufficient documentation

## 2012-03-11 DIAGNOSIS — D126 Benign neoplasm of colon, unspecified: Secondary | ICD-10-CM | POA: Insufficient documentation

## 2012-03-11 DIAGNOSIS — K621 Rectal polyp: Secondary | ICD-10-CM

## 2012-03-11 DIAGNOSIS — Z85038 Personal history of other malignant neoplasm of large intestine: Secondary | ICD-10-CM

## 2012-03-11 DIAGNOSIS — J449 Chronic obstructive pulmonary disease, unspecified: Secondary | ICD-10-CM | POA: Insufficient documentation

## 2012-03-11 HISTORY — PX: COLONOSCOPY: SHX5424

## 2012-03-11 LAB — GLUCOSE, CAPILLARY: Glucose-Capillary: 110 mg/dL — ABNORMAL HIGH (ref 70–99)

## 2012-03-11 SURGERY — COLONOSCOPY
Anesthesia: Moderate Sedation

## 2012-03-11 MED ORDER — MEPERIDINE HCL 100 MG/ML IJ SOLN
INTRAMUSCULAR | Status: DC | PRN
  Administered 2012-03-11: 50 mg via INTRAVENOUS

## 2012-03-11 MED ORDER — ONDANSETRON HCL 4 MG/2ML IJ SOLN
INTRAMUSCULAR | Status: DC | PRN
  Administered 2012-03-11: 4 mg via INTRAVENOUS

## 2012-03-11 MED ORDER — SODIUM CHLORIDE 0.45 % IV SOLN
INTRAVENOUS | Status: DC
  Administered 2012-03-11: 10:00:00 via INTRAVENOUS

## 2012-03-11 MED ORDER — MEPERIDINE HCL 100 MG/ML IJ SOLN
INTRAMUSCULAR | Status: AC
  Filled 2012-03-11: qty 2

## 2012-03-11 MED ORDER — MIDAZOLAM HCL 5 MG/5ML IJ SOLN
INTRAMUSCULAR | Status: AC
  Filled 2012-03-11: qty 10

## 2012-03-11 MED ORDER — MIDAZOLAM HCL 5 MG/5ML IJ SOLN
INTRAMUSCULAR | Status: DC | PRN
  Administered 2012-03-11: 2 mg via INTRAVENOUS

## 2012-03-11 MED ORDER — ONDANSETRON HCL 4 MG/2ML IJ SOLN
INTRAMUSCULAR | Status: AC
  Filled 2012-03-11: qty 2

## 2012-03-11 MED ORDER — STERILE WATER FOR IRRIGATION IR SOLN
Status: DC | PRN
  Administered 2012-03-11: 11:00:00

## 2012-03-11 NOTE — Op Note (Signed)
[facility] [address] [place], [address]   COLONOSCOPY PROCEDURE REPORT  PATIENT: Howard, [name]  MR#:         [id number] BIRTHDATE: Oct 12, 1929 , 82  yrs. old GENDER: Male ENDOSCOPIST: R.  Roetta Sessions, MD FACP FACG REFERRED BY:  Butch Penny, M.D. PROCEDURE DATE:  03/11/2012 PROCEDURE:     Colonoscopy with biopsy  INDICATIONS: Status post right hemicolectomy for colorectal cancer; Hemoccult-positive stool  INFORMED CONSENT:  The risks, benefits, alternatives and imponderables including but not limited to bleeding, perforation as well as the possibility of a missed lesion have been reviewed.  The potential for biopsy, lesion removal, etc. have also been discussed.  Questions have been answered.  All parties agreeable. Please see the history and physical in the medical record for more information.  MEDICATIONS: Versed 2 mg IV and Demerol 50 mg IV.  DESCRIPTION OF PROCEDURE:  After a digital rectal exam was performed, the Pentax Colonoscope [phone]  colonoscope was advanced from the anus through the rectum and colon to the area of the cecum, ileocecal valve and appendiceal orifice.  The cecum was deeply intubated.  These structures were well-seen and photographed for the record.  From the level of the cecum and ileocecal valve, the scope was slowly and cautiously withdrawn.  The mucosal surfaces were carefully surveyed utilizing scope tip deflection to facilitate fold flattening as needed.  The scope was pulled down into the rectum where a thorough examination including retroflexion was performed.    FINDINGS:  Adequate preparation. (1) diminutive polyp in the distal rectum just 2 cm in from the anal verge.  Otherwise, the remainder of the rectal mucosa appeared normal.  Pancolonic diverticulosis. Status post right hemicolectomy with surgical anastomosis readily identified. The distal 10 cm of neoterminal ileum appeared normal. There was a single  diminutive polyp in the mid descending segment; otherwise, the remainder of the colonic mucosa appeared normal aside from that mentioned above.  THERAPEUTIC / DIAGNOSTIC MANEUVERS PERFORMED:  The above-mentioned polyps were cold biopsied/removed  COMPLICATIONS: None  CECAL WITHDRAWAL TIME:  not applicable  IMPRESSION:  Status post right hemicolectomy. Pancolonic diverticulosis. Rectal and colonic polyps-removed as described above  RECOMMENDATIONS: Followup on pathology.   _______________________________ eSigned:  R. Roetta Sessions, MD FACP [facility] 03/11/2012 11:44 AM   CC:    PATIENT NAME:  Howard, [name] MR#: [id number]

## 2012-03-11 NOTE — H&P (Signed)
HPI:  79 year old male with hx of microscopic colitis and IBS-D, last seen late Nov 2013 by Dr. Jena Gauss for fecal incontinence and diarrhea. At that time, he had noted improvement after stopping Glucophage and using Questran. Last TCS in July 2011  with diverticula and hemorrhoids; due for surveillance secondary to colon adenocarcinoma in 2016. Last EGD in June 2012 with Schatzki's ring and hiatal hernia.  Presents today at the request of Dr. Renard Matter with concerns for new-onset melena, Hgb 8.9, heme + stool. Last Hgb in epic 10.27 August 2011. Iron low at 21. Ferritin 96, normal.  Went to doctor last Friday, then [place] and Tues for abdominal cramping, diarrhea, nausea.Prescribed Bentyl last Monday for lower abdominal cramping, which has now improved. Diarrhea resolved. Nausea improved, intermittent. Cramping improved. Upon review of chart, appears issues with cramping in past; this is not a new finding. Pt notes melena for past few days. No bright red blood. No pain with eating. Feels weak.   EGD recently-H. pylori gastritis. Here for colonoscopy to further evaluate Hemoccult-positive stool.  Past Medical History   Diagnosis  Date   .  Hypertension    .  Hyperlipidemia    .  Adenocarcinoma  2003     COLON   .  PVD (peripheral vascular disease)      ABI 0.78 RIGHT AND 0.80 LEFT 4/11   .  Osteoarthritis      OF KNEES   .  Kidney stone    .  Schatzki's ring  08/05/10     egd with Dr. Jena Gauss   .  Hiatal hernia  08/05/10     egd with Dr. Jena Gauss   .  Esophageal dysmotility    .  Microscopic colitis    .  Internal hemorrhoids    .  Diverticula of colon    .  CHF (congestive heart failure)    .  Coronary artery stenosis      a. 06/2011 Cath: LM 20, LAD min irregs, D1 95 - small, LCX 60p, RCA 59m, 50d, PLA 50ost; b. 06/2011 PCI/Rota RCA -> 3.0x60mm Promus Element DES   .  Angina    .  COPD (chronic obstructive pulmonary disease)    .  Asthma    .  Pneumonia  ~ 2005; 09/2005   .  Shortness of breath   07/17/11     "laying down"   .  Type II diabetes mellitus    .  GERD (gastroesophageal reflux disease)    .  Anxiety    .  Ischemic cardiomyopathy      a. 05/2011 Echo: EF 45-50%, inf/post HK    Past Surgical History   Procedure  Date   .  Hemicolectomy  2003   .  Appendectomy    .  Colonoscopy  04/2001     Dr. Karilyn Cota- colon carcinoma   .  Esophagogastroduodenoscopy  04/2001     Dr. Karilyn Cota   .  Esophagogastroduodenoscopy  08/05/10     Dr. Rinaldo Ratel ring, hiatal hernia   .  Colonoscopy  09/04/09     Dr. Chauncy Passy, hemorrhoids   .  Atherectomy  07/17/11   .  Coronary angioplasty  07/17/11     rotablator   .  Colon surgery    .  Cataract extraction, bilateral     Current Outpatient Prescriptions   Medication  Sig  Dispense  Refill   .  albuterol (PROAIR HFA) 108 (90 BASE) MCG/ACT inhaler  Inhale 2 puffs into  the lungs every 6 (six) hours as needed. For shortness of breath     .  aspirin EC 81 MG tablet  Take 81 mg by mouth daily.     .  cholestyramine (QUESTRAN) 4 GM/DOSE powder  Take 0.5 packets (2 g total) by mouth daily.  378 g  12   .  Cinnamon 500 MG capsule  Take 1,000 mg by mouth daily.     .  clopidogrel (PLAVIX) 75 MG tablet  Take 1 tablet (75 mg total) by mouth daily.  90 tablet  3   .  COREG 6.25 MG tablet  TAKE 1 TABLET BY MOUTH 2 TIMES DAILY.  180 tablet  3   .  dicyclomine (BENTYL) 10 MG capsule  Take 10 mg by mouth 4 (four) times daily - before meals and at bedtime.     Marland Kitchen  doxazosin (CARDURA) 2 MG tablet  TAKE ONE TABLET BY MOUTH AT BEDTIME.  30 tablet  6   .  Ferrous Sulfate (IRON) 325 (65 FE) MG TABS  Take 325 mg by mouth 2 (two) times daily.  60 each  0   .  fish oil-omega-3 fatty acids 1000 MG capsule  Take 1 g by mouth daily.     .  furosemide (LASIX) 20 MG tablet  Take 20 mg by mouth 2 (two) times daily.     Marland Kitchen  GLIPIZIDE XL 5 MG 24 hr tablet  Take 5 mg by mouth daily.     Marland Kitchen  HYDROcodone-homatropine (HYCODAN) 5-1.5 MG/5ML syrup  Take 5 mLs by mouth every  4 (four) hours as needed. For cough     .  loperamide (IMODIUM) 2 MG capsule  Take 2 mg by mouth as needed. For runny stools     .  LORazepam (ATIVAN) 0.5 MG tablet  Take 0.5 mg by mouth every 4 (four) hours as needed. For anxiety     .  metFORMIN (GLUCOPHAGE) 500 MG tablet  Take 1 tablet (500 mg total) by mouth daily.     .  Multiple Vitamins-Minerals (MULTIVITAMIN) tablet  Take 1 tablet by mouth daily.  30 tablet  0   .  nitroGLYCERIN (NITROSTAT) 0.4 MG SL tablet  Place 1 tablet (0.4 mg total) under the tongue every 5 (five) minutes as needed for chest pain.  25 tablet  3   .  ondansetron (ZOFRAN) 4 MG tablet  Take 4 mg by mouth every 8 (eight) hours as needed.     .  potassium chloride SA (K-DUR,KLOR-CON) 20 MEQ tablet  Take 20 mEq by mouth daily.     Marland Kitchen  PROTONIX 40 MG tablet  TAKE 1 TABLET BY MOUTH ONCE A DAY.  30 each  11   .  pyridostigmine (MESTINON) 60 MG tablet  Take 60 mg by mouth 2 (two) times daily.     .  simvastatin (ZOCOR) 40 MG tablet  Take 40 mg by mouth every evening.      Allergies as of 02/26/2012   .  (No Known Allergies)    Family History   Problem  Relation  Age of Onset   .  Coronary artery disease  Father    .  Coronary artery disease  Brother       CABG    .  Coronary artery disease  Brother     History    Social History   .  Marital Status:  Married     Spouse Name:  N/A  Number of Children:  N/A   .  Years of Education:  N/A    Social History Main Topics   .  Smoking status:  Former Smoker -- 20 years     Types:  Cigarettes     Quit date:  02/24/1997   .  Smokeless tobacco:  Never Used   .  Alcohol Use:  No   .  Drug Use:  No   .  Sexually Active:  Not Currently    Other Topics  Concern   .  None    Social History Narrative    Lives in [place]. Married. Wife in [place].Son lives with him    Review of Systems:  Negative unless otherwise mentioned in HPI.  Physical Exam:   Head: Normocephalic and atraumatic.  Eyes: Conjuctiva clear  without scleral icterus.  Neck: Supple, without mass or thyromegaly.  Heart: S1, S2 present without murmurs, rubs, or gallops. Regular rate and rhythm.  Abdomen: +BS, soft, non-tender and non-distended. No rebound or guarding. No HSM or masses noted. Likely ventral hernia noted.  Msk: Symmetrical without gross deformities. Normal posture.  Extremities: Without edema.  Neurologic: Alert and oriented x4; grossly normal neurologically.  Skin: Intact without significant lesions or rashes.  Cervical Nodes: No significant cervical adenopathy.  Psych: Alert and cooperative. Normal mood and affect.  79 year old male with new-onset melena, heme + stool. Drop in Hgb from 10.3 in July to now 8.9. Iron low at 21 but ferritin normal at 96. Episode of nausea, abdominal cramping, diarrhea last week now resolved, question self-limiting process and not likely related to current issue. No epigastric pain noted, but he does feel weak. He is on Plavix, which can contribute to occult GI bleeding. Anemia appears multifactorial. He does have a remote hx of colonic adenocarcinoma, with last TCS in July 2011. Due for surveillance in 2016. At this point, hold off on lower GI evaluation as he has no evidence of hematochezia; likely upper GI source. However, with his hx, I did explain to the patient that he may need a colonoscopy sooner than 2016. Pt and son both agreeable.  History of EGD as outlined above.   Impression:  Hemoccult-positive stool; history of colon cancer. The EGD as  findings as outlined. Colonoscopy to be performed today.The risks, benefits, limitations, alternatives and imponderables have been reviewed with the patient. Questions have been answered. All parties are agreeable.

## 2012-03-15 ENCOUNTER — Encounter (HOSPITAL_COMMUNITY): Payer: Self-pay | Admitting: Internal Medicine

## 2012-03-18 ENCOUNTER — Encounter: Payer: Self-pay | Admitting: *Deleted

## 2012-03-18 ENCOUNTER — Encounter: Payer: Self-pay | Admitting: Internal Medicine

## 2012-04-12 ENCOUNTER — Encounter: Payer: Self-pay | Admitting: Internal Medicine

## 2012-04-12 ENCOUNTER — Ambulatory Visit (INDEPENDENT_AMBULATORY_CARE_PROVIDER_SITE_OTHER): Payer: Medicare Other | Admitting: Internal Medicine

## 2012-04-12 VITALS — BP 128/62 | HR 68 | Ht 68.0 in | Wt 189.0 lb

## 2012-04-12 DIAGNOSIS — E785 Hyperlipidemia, unspecified: Secondary | ICD-10-CM

## 2012-04-12 DIAGNOSIS — I2510 Atherosclerotic heart disease of native coronary artery without angina pectoris: Secondary | ICD-10-CM

## 2012-04-12 DIAGNOSIS — I10 Essential (primary) hypertension: Secondary | ICD-10-CM

## 2012-04-12 NOTE — Patient Instructions (Addendum)
Your physician recommends that you schedule a follow-up appointment in: 6 MONTHS WITH GT

## 2012-04-12 NOTE — Assessment & Plan Note (Signed)
The patient denies anginal symptoms. He will continue his current medical therapy. 

## 2012-04-12 NOTE — Progress Notes (Signed)
HPI [name] returns today for followup. He is a very pleasant 79 year old man with a history of coronary artery disease, status post stenting just over a year ago. He has dyslipidemia, and hypertension. He is diabetic. Despite his multiple problems, he is stable from a cardiovascular perspective. His biggest complaint is loss of vision in his left eye. He denies chest pain, shortness of breath, or peripheral edema. He is bothered by arthritis and walks with a cane. No syncope. No Known Allergies   Current Outpatient Prescriptions  Medication Sig Dispense Refill  . albuterol (PROAIR HFA) 108 (90 BASE) MCG/ACT inhaler Inhale 2 puffs into the lungs every 6 (six) hours as needed. For shortness of breath      . aspirin EC 81 MG tablet Take 81 mg by mouth daily.      . carvedilol (COREG) 6.25 MG tablet Take 6.25 mg by mouth 2 (two) times daily.      . cholestyramine (QUESTRAN) 4 GM/DOSE powder Take 2 g by mouth daily.      . Cinnamon 500 MG capsule Take 1,000 mg by mouth daily.      . clopidogrel (PLAVIX) 75 MG tablet Take 1 tablet (75 mg total) by mouth daily.  90 tablet  3  . dicyclomine (BENTYL) 10 MG capsule Take 10 mg by mouth 4 (four) times daily -  before meals and at bedtime.       Marland Kitchen doxazosin (CARDURA) 2 MG tablet Take 2 mg by mouth at bedtime.      . Ferrous Sulfate (IRON) 325 (65 FE) MG TABS Take 325 mg by mouth 2 (two) times daily.  60 each  0  . fish oil-omega-3 fatty acids 1000 MG capsule Take 1 g by mouth daily.       . furosemide (LASIX) 20 MG tablet Take 20 mg by mouth 2 (two) times daily.       Marland Kitchen GLIPIZIDE XL 5 MG 24 hr tablet Take 5 mg by mouth daily.       Marland Kitchen loperamide (IMODIUM) 2 MG capsule Take 2 mg by mouth as needed. For runny stools      . LORazepam (ATIVAN) 0.5 MG tablet Take 0.5 mg by mouth every 4 (four) hours as needed. For anxiety       . Multiple Vitamins-Minerals (MULTIVITAMIN) tablet Take 1 tablet by mouth daily.  30 tablet  0  . nitroGLYCERIN (NITROSTAT) 0.4 MG SL  tablet Place 1 tablet (0.4 mg total) under the tongue every 5 (five) minutes as needed for chest pain.  25 tablet  3  . ondansetron (ZOFRAN) 4 MG tablet Take 4 mg by mouth every 8 (eight) hours as needed. nausea      . pantoprazole (PROTONIX) 40 MG tablet Take 40 mg by mouth daily.      . potassium chloride SA (K-DUR,KLOR-CON) 20 MEQ tablet Take 20 mEq by mouth daily.       Marland Kitchen pyridostigmine (MESTINON) 60 MG tablet Take 60 mg by mouth 2 (two) times daily.      . simvastatin (ZOCOR) 40 MG tablet Take 40 mg by mouth every evening.       No current facility-administered medications for this visit.     Past Medical History  Diagnosis Date  . Hypertension   . Hyperlipidemia   . Adenocarcinoma 2003    COLON  . PVD (peripheral vascular disease)     ABI 0.78 RIGHT AND 0.80 LEFT 4/11  . Osteoarthritis     OF KNEES  .  Kidney stone   . Schatzki's ring 08/05/10    egd with Dr. Jena Gauss  . Hiatal hernia 08/05/10    egd with Dr. Jena Gauss  . Esophageal dysmotility   . Microscopic colitis   . Internal hemorrhoids   . Diverticula of colon   . CHF (congestive heart failure)   . Coronary artery stenosis     a. 06/2011 Cath: LM 20, LAD min irregs, D1 95 - small,  LCX 60p, RCA 41m, 50d, PLA 50ost;  b. 06/2011 PCI/Rota  RCA  -> 3.0x19mm Promus Element DES  . Angina   . COPD (chronic obstructive pulmonary disease)   . Asthma   . Pneumonia ~ 2005; 09/2005  . Shortness of breath 07/17/11    "laying down"  . Type II diabetes mellitus   . GERD (gastroesophageal reflux disease)   . Anxiety   . Ischemic cardiomyopathy     a. 05/2011 Echo: EF 45-50%, inf/post HK  . H. pylori infection 03/01/12    treated with prevpac    ROS:   All systems reviewed and negative except as noted in the HPI.   Past Surgical History  Procedure Laterality Date  . Hemicolectomy  2003  . Appendectomy    . Colonoscopy  04/2001    Dr. Karilyn Cota- colon carcinoma  . Esophagogastroduodenoscopy  04/2001    Dr. Karilyn Cota  .  Esophagogastroduodenoscopy  08/05/10    Dr. Rinaldo Ratel ring, hiatal hernia  . Colonoscopy  09/04/09    Dr. Chauncy Passy, hemorrhoids  . Atherectomy  07/17/11  . Coronary angioplasty  07/17/11    rotablator  . Colon surgery    . Cataract extraction, bilateral    . Esophagogastroduodenoscopy  03/01/2012    Procedure: ESOPHAGOGASTRODUODENOSCOPY (EGD);  Surgeon: Corbin Ade, MD;  Location: [facility];  Service: Endoscopy;  Laterality: N/A;  1:15  . Colonoscopy  03/11/2012    Procedure: COLONOSCOPY;  Surgeon: Corbin Ade, MD;  Location: [facility];  Service: Endoscopy;  Laterality: N/A;  10:30     Family History  Problem Relation Age of Onset  . Coronary artery disease Father   . Coronary artery disease Brother     CABG  . Coronary artery disease Brother      History   Social History  . Marital Status: Married    Spouse Name: N/A    Number of Children: N/A  . Years of Education: N/A   Occupational History  . Not on file.   Social History Main Topics  . Smoking status: Former Smoker -- 20 years    Types: Cigarettes    Quit date: 02/24/1997  . Smokeless tobacco: Never Used  . Alcohol Use: No  . Drug Use: No  . Sexually Active: Not Currently   Other Topics Concern  . Not on file   Social History Narrative   Lives in [place]. Married. Wife in [place].   Son lives with him     BP 128/62  Pulse 68  Ht 5\' 8"  (1.727 m)  Wt 189 lb (85.73 kg)  BMI 28.74 kg/m2  Physical Exam:  Chronically ill appearing elderly man, NAD HEENT: Unremarkable Neck:  7 cm JVD, no thyromegally Lungs:  Clearwith no wheezes, rales, or rhonchi. HEART:  Regular rate rhythm, no murmurs, no rubs, no clicks, heart sounds are distant. Abd:  soft, soft, obese,positive bowel sounds, no organomegally, no rebound, no guarding Ext:  2 plus pulses, no edema, no cyanosis, no clubbing Skin:  No rashes no nodules Neuro:  CN II through XII intact, motor grossly intact  EKG - sinus rhythm  with PVCs and nonspecific ST-T wave abnormality.  DEVICE  Normal device function.  See PaceArt for details.   Assess/Plan:

## 2012-04-12 NOTE — Assessment & Plan Note (Signed)
I've encouraged the patient to lose weight, and continue his statin therapy.

## 2012-05-09 ENCOUNTER — Encounter (HOSPITAL_COMMUNITY): Payer: Self-pay | Admitting: Emergency Medicine

## 2012-05-09 ENCOUNTER — Emergency Department (HOSPITAL_COMMUNITY)
Admission: EM | Admit: 2012-05-09 | Discharge: 2012-05-09 | Disposition: A | Discharge status: Home / Self Care | Payer: Medicare Other | Attending: Emergency Medicine | Admitting: Emergency Medicine

## 2012-05-09 ENCOUNTER — Emergency Department (HOSPITAL_COMMUNITY): Payer: Medicare Other

## 2012-05-09 DIAGNOSIS — R531 Weakness: Secondary | ICD-10-CM

## 2012-05-09 DIAGNOSIS — Z8679 Personal history of other diseases of the circulatory system: Secondary | ICD-10-CM | POA: Insufficient documentation

## 2012-05-09 DIAGNOSIS — Z8719 Personal history of other diseases of the digestive system: Secondary | ICD-10-CM | POA: Insufficient documentation

## 2012-05-09 DIAGNOSIS — F411 Generalized anxiety disorder: Secondary | ICD-10-CM | POA: Insufficient documentation

## 2012-05-09 DIAGNOSIS — Z9861 Coronary angioplasty status: Secondary | ICD-10-CM | POA: Insufficient documentation

## 2012-05-09 DIAGNOSIS — R5381 Other malaise: Secondary | ICD-10-CM | POA: Insufficient documentation

## 2012-05-09 DIAGNOSIS — IMO0002 Reserved for concepts with insufficient information to code with codable children: Secondary | ICD-10-CM | POA: Insufficient documentation

## 2012-05-09 DIAGNOSIS — Y9289 Other specified places as the place of occurrence of the external cause: Secondary | ICD-10-CM | POA: Insufficient documentation

## 2012-05-09 DIAGNOSIS — E785 Hyperlipidemia, unspecified: Secondary | ICD-10-CM | POA: Insufficient documentation

## 2012-05-09 DIAGNOSIS — Z8619 Personal history of other infectious and parasitic diseases: Secondary | ICD-10-CM | POA: Insufficient documentation

## 2012-05-09 DIAGNOSIS — R197 Diarrhea, unspecified: Secondary | ICD-10-CM | POA: Insufficient documentation

## 2012-05-09 DIAGNOSIS — W010XXA Fall on same level from slipping, tripping and stumbling without subsequent striking against object, initial encounter: Secondary | ICD-10-CM | POA: Insufficient documentation

## 2012-05-09 DIAGNOSIS — Z8701 Personal history of pneumonia (recurrent): Secondary | ICD-10-CM | POA: Insufficient documentation

## 2012-05-09 DIAGNOSIS — Z85038 Personal history of other malignant neoplasm of large intestine: Secondary | ICD-10-CM | POA: Insufficient documentation

## 2012-05-09 DIAGNOSIS — I509 Heart failure, unspecified: Secondary | ICD-10-CM | POA: Insufficient documentation

## 2012-05-09 DIAGNOSIS — Z87442 Personal history of urinary calculi: Secondary | ICD-10-CM | POA: Insufficient documentation

## 2012-05-09 DIAGNOSIS — Z7982 Long term (current) use of aspirin: Secondary | ICD-10-CM | POA: Insufficient documentation

## 2012-05-09 DIAGNOSIS — I10 Essential (primary) hypertension: Secondary | ICD-10-CM | POA: Insufficient documentation

## 2012-05-09 DIAGNOSIS — R112 Nausea with vomiting, unspecified: Secondary | ICD-10-CM | POA: Insufficient documentation

## 2012-05-09 DIAGNOSIS — Z7902 Long term (current) use of antithrombotics/antiplatelets: Secondary | ICD-10-CM | POA: Insufficient documentation

## 2012-05-09 DIAGNOSIS — Z79899 Other long term (current) drug therapy: Secondary | ICD-10-CM | POA: Insufficient documentation

## 2012-05-09 DIAGNOSIS — Z8669 Personal history of other diseases of the nervous system and sense organs: Secondary | ICD-10-CM | POA: Insufficient documentation

## 2012-05-09 DIAGNOSIS — Y939 Activity, unspecified: Secondary | ICD-10-CM | POA: Insufficient documentation

## 2012-05-09 DIAGNOSIS — I2510 Atherosclerotic heart disease of native coronary artery without angina pectoris: Secondary | ICD-10-CM | POA: Insufficient documentation

## 2012-05-09 DIAGNOSIS — K219 Gastro-esophageal reflux disease without esophagitis: Secondary | ICD-10-CM | POA: Insufficient documentation

## 2012-05-09 DIAGNOSIS — E119 Type 2 diabetes mellitus without complications: Secondary | ICD-10-CM | POA: Insufficient documentation

## 2012-05-09 DIAGNOSIS — J4489 Other specified chronic obstructive pulmonary disease: Secondary | ICD-10-CM | POA: Insufficient documentation

## 2012-05-09 DIAGNOSIS — R300 Dysuria: Secondary | ICD-10-CM | POA: Insufficient documentation

## 2012-05-09 DIAGNOSIS — Z87891 Personal history of nicotine dependence: Secondary | ICD-10-CM | POA: Insufficient documentation

## 2012-05-09 LAB — URINALYSIS, ROUTINE W REFLEX MICROSCOPIC
Glucose, UA: NEGATIVE mg/dL
Hgb urine dipstick: NEGATIVE
Ketones, ur: NEGATIVE mg/dL
Leukocytes, UA: NEGATIVE
Nitrite: NEGATIVE
Protein, ur: NEGATIVE mg/dL
Specific Gravity, Urine: 1.02 (ref 1.005–1.030)
Urobilinogen, UA: 0.2 mg/dL (ref 0.0–1.0)
pH: 6 (ref 5.0–8.0)

## 2012-05-09 LAB — CBC WITH DIFFERENTIAL/PLATELET
Basophils Absolute: 0 10*3/uL (ref 0.0–0.1)
Basophils Relative: 0 % (ref 0–1)
Eosinophils Absolute: 0 10*3/uL (ref 0.0–0.7)
Eosinophils Relative: 0 % (ref 0–5)
HCT: 28 % — ABNORMAL LOW (ref 39.0–52.0)
Hemoglobin: 9.4 g/dL — ABNORMAL LOW (ref 13.0–17.0)
Lymphocytes Relative: 3 % — ABNORMAL LOW (ref 12–46)
Lymphs Abs: 0.6 10*3/uL — ABNORMAL LOW (ref 0.7–4.0)
MCH: 33.2 pg (ref 26.0–34.0)
MCHC: 33.6 g/dL (ref 30.0–36.0)
MCV: 98.9 fL (ref 78.0–100.0)
Monocytes Absolute: 1.9 10*3/uL — ABNORMAL HIGH (ref 0.1–1.0)
Monocytes Relative: 11 % (ref 3–12)
Neutro Abs: 15.1 10*3/uL — ABNORMAL HIGH (ref 1.7–7.7)
Neutrophils Relative %: 86 % — ABNORMAL HIGH (ref 43–77)
Platelets: 150 10*3/uL (ref 150–400)
RBC: 2.83 MIL/uL — ABNORMAL LOW (ref 4.22–5.81)
RDW: 15.2 % (ref 11.5–15.5)
WBC: 17.6 10*3/uL — ABNORMAL HIGH (ref 4.0–10.5)

## 2012-05-09 LAB — BASIC METABOLIC PANEL
BUN: 23 mg/dL (ref 6–23)
CO2: 25 mEq/L (ref 19–32)
Calcium: 8.7 mg/dL (ref 8.4–10.5)
Chloride: 101 mEq/L (ref 96–112)
Creatinine, Ser: 1.05 mg/dL (ref 0.50–1.35)
GFR calc Af Amer: 75 mL/min — ABNORMAL LOW (ref >90)
GFR calc non Af Amer: 64 mL/min — ABNORMAL LOW (ref >90)
Glucose, Bld: 148 mg/dL — ABNORMAL HIGH (ref 70–99)
Potassium: 3.7 mEq/L (ref 3.5–5.1)
Sodium: 135 mEq/L (ref 135–145)

## 2012-05-09 MED ORDER — SODIUM CHLORIDE 0.9 % IV BOLUS (SEPSIS)
1000.0000 mL | Freq: Once | INTRAVENOUS | Status: AC
  Administered 2012-05-09: 1000 mL via INTRAVENOUS

## 2012-05-09 MED ORDER — ONDANSETRON HCL 4 MG/2ML IJ SOLN: 4.0000 mg | Freq: Once | INTRAMUSCULAR | Status: DC

## 2012-05-09 MED ORDER — ONDANSETRON HCL 4 MG/2ML IJ SOLN
4.0000 mg | Freq: Once | INTRAMUSCULAR | Status: AC
  Administered 2012-05-09: 4 mg via INTRAVENOUS
  Filled 2012-05-09: qty 2

## 2012-05-09 NOTE — ED Notes (Signed)
EMS called for fall. cbg 192. Pt c/o gen weakness since yesterday. Had had n/v/d since yesterday. EMS states pt was sob and 88%ra. 02 applied in route and cont in ED. Pt alert/orietned. C/o lower back pain from fall this am "hurts a little bit". Denies hitting head. C/o prod cough with white phlegm x 2-3 days. Nad. No resp distress noted.stabbing rlq pain. Denies urinary sx's.

## 2012-05-09 NOTE — ED Notes (Signed)
EDp aware pt wanting food.

## 2012-05-09 NOTE — ED Notes (Signed)
Per nurse's in/out cath for urine.

## 2012-05-09 NOTE — ED Provider Notes (Signed)
History    This chart was scribed for Raeford Razor, MD by Charolett Bumpers, ED Scribe. The patient was seen in room APA02/APA02. Patient's care was started at 1141.   CSN: [id number]  Arrival date & time 05/09/12  1007   First MD Initiated Contact with Patient 05/09/12 1141      Chief Complaint  Patient presents with  . Weakness    The history is provided by the patient and a relative. No language interpreter was used.   Howard [name] is a 79 y.o. male who presents to the Emergency Department via EMS complaining of gradually worsening, moderate, generalized weakness since yesterday. He fell this morning, after his knees buckled while in the bathroom. He states that he felt weak and lost his balance, causing him to fall. He hit his back upon falling, denies hitting his head. He states that he was sick with nausea, vomiting and diarrhea yesterday. He complains of some mild mid back pain near his right shoulder blade now. He denies any fever, hematemesis, bloody stools, headaches, neck pain. He states that he hasn't taken his fluid pill today and has had difficulty urinating. Son states that the pt has a h/o emphysema, bronchitis, DM and had a stent placed 4 months ago. He had a endoscopy and colonoscopy a few months ago that showed polyps.   Past Medical History  Diagnosis Date  . Hypertension   . Hyperlipidemia   . Adenocarcinoma 2003    COLON  . PVD (peripheral vascular disease)     ABI 0.78 RIGHT AND 0.80 LEFT 4/11  . Osteoarthritis     OF KNEES  . Kidney stone   . Schatzki's ring 08/05/10    egd with Dr. Jena Gauss  . Hiatal hernia 08/05/10    egd with Dr. Jena Gauss  . Esophageal dysmotility   . Microscopic colitis   . Internal hemorrhoids   . Diverticula of colon   . CHF (congestive heart failure)   . Coronary artery stenosis     a. 06/2011 Cath: LM 20, LAD min irregs, D1 95 - small,  LCX 60p, RCA 54m, 50d, PLA 50ost;  b. 06/2011 PCI/Rota  RCA  -> 3.0x26mm Promus Element DES  .  Angina   . COPD (chronic obstructive pulmonary disease)   . Asthma   . Pneumonia ~ 2005; 09/2005  . Shortness of breath 07/17/11    "laying down"  . Type II diabetes mellitus   . GERD (gastroesophageal reflux disease)   . Anxiety   . Ischemic cardiomyopathy     a. 05/2011 Echo: EF 45-50%, inf/post HK  . H. pylori infection 03/01/12    treated with prevpac    Past Surgical History  Procedure Laterality Date  . Hemicolectomy  2003  . Appendectomy    . Colonoscopy  04/2001    Dr. Karilyn Cota- colon carcinoma  . Esophagogastroduodenoscopy  04/2001    Dr. Karilyn Cota  . Esophagogastroduodenoscopy  08/05/10    Dr. Rinaldo Ratel ring, hiatal hernia  . Colonoscopy  09/04/09    Dr. Chauncy Passy, hemorrhoids  . Atherectomy  07/17/11  . Coronary angioplasty  07/17/11    rotablator  . Colon surgery    . Cataract extraction, bilateral    . Esophagogastroduodenoscopy  03/01/2012    Procedure: ESOPHAGOGASTRODUODENOSCOPY (EGD);  Surgeon: Corbin Ade, MD;  [facility];  Service: Endoscopy;  Laterality: N/A;  1:15  . Colonoscopy  03/11/2012    Procedure: COLONOSCOPY;  Surgeon: Corbin Ade, MD;  Location:  [facility];  Service: Endoscopy;  Laterality: N/A;  10:30    Family History  Problem Relation Age of Onset  . Coronary artery disease Father   . Coronary artery disease Brother     CABG  . Coronary artery disease Brother     History  Substance Use Topics  . Smoking status: Former Smoker -- 20 years    Types: Cigarettes    Quit date: 02/24/1997  . Smokeless tobacco: Never Used  . Alcohol Use: No      Review of Systems  Constitutional: Negative for fever and chills.  HENT: Negative for neck pain.   Gastrointestinal: Positive for nausea, vomiting and diarrhea. Negative for blood in stool.  Musculoskeletal: Positive for back pain.  Neurological: Positive for weakness. Negative for headaches.  All other systems reviewed and are negative.    Allergies  Review of  patient's allergies indicates no known allergies.  Home Medications   Current Outpatient Rx  Name  Route  Sig  Dispense  Refill  . albuterol (PROAIR HFA) 108 (90 BASE) MCG/ACT inhaler   Inhalation   Inhale 2 puffs into the lungs every 4 (four) hours as needed. For shortness of breath         . aspirin EC 81 MG tablet   Oral   Take 81 mg by mouth daily.         . carvedilol (COREG) 6.25 MG tablet   Oral   Take 6.25 mg by mouth 2 (two) times daily.         . cholestyramine (QUESTRAN) 4 GM/DOSE powder   Oral   Take 2 g by mouth daily.          . Cinnamon 500 MG capsule   Oral   Take 1,000 mg by mouth daily.         . clopidogrel (PLAVIX) 75 MG tablet   Oral   Take 1 tablet (75 mg total) by mouth daily.   90 tablet   3   . dicyclomine (BENTYL) 10 MG capsule   Oral   Take 20 mg by mouth 4 (four) times daily as needed (cramps).          . doxazosin (CARDURA) 2 MG tablet   Oral   Take 2 mg by mouth at bedtime.         . Ferrous Sulfate (IRON) 325 (65 FE) MG TABS   Oral   Take 325 mg by mouth 2 (two) times daily.   60 each   0     Pt. Is to take for only 1 month   . fish oil-omega-3 fatty acids 1000 MG capsule   Oral   Take 1 g by mouth daily.          . furosemide (LASIX) 20 MG tablet   Oral   Take 20 mg by mouth 2 (two) times daily.          Marland Kitchen GLIPIZIDE XL 5 MG 24 hr tablet   Oral   Take 5 mg by mouth daily.          Marland Kitchen LORazepam (ATIVAN) 0.5 MG tablet   Oral   Take 0.5 mg by mouth every 4 (four) hours as needed. For anxiety          . Multiple Vitamins-Minerals (MULTIVITAMIN) tablet   Oral   Take 1 tablet by mouth daily.   30 tablet   0   . ondansetron (ZOFRAN) 4 MG tablet   Oral  Take 4 mg by mouth every 6 (six) hours as needed for nausea. nausea         . pantoprazole (PROTONIX) 40 MG tablet   Oral   Take 40 mg by mouth daily.         . potassium chloride SA (K-DUR,KLOR-CON) 20 MEQ tablet   Oral   Take 20 mEq by  mouth daily.          . simvastatin (ZOCOR) 40 MG tablet   Oral   Take 40 mg by mouth every evening.         . loperamide (IMODIUM) 2 MG capsule   Oral   Take 2 mg by mouth as needed. For runny stools         . nitroGLYCERIN (NITROSTAT) 0.4 MG SL tablet   Sublingual   Place 1 tablet (0.4 mg total) under the tongue every 5 (five) minutes as needed for chest pain.   25 tablet   3   . pyridostigmine (MESTINON) 60 MG tablet   Oral   Take 60 mg by mouth 2 (two) times daily.           BP 106/47  Pulse 90  Temp(Src) 99.1 F (37.3 C) (Oral)  Resp 22  Ht 5\' 8"  (1.727 m)  Wt 189 lb (85.73 kg)  BMI 28.74 kg/m2  SpO2 97%  Physical Exam  Nursing note and vitals reviewed. Constitutional: He appears well-developed and well-nourished. No distress.  HENT:  Head: Normocephalic and atraumatic.  Eyes: Conjunctivae and EOM are normal. Right eye exhibits no discharge. Left eye exhibits no discharge.  Neck: Neck supple.  Cardiovascular: Normal rate and regular rhythm.  Exam reveals no gallop and no friction rub.   Murmur heard.  Systolic murmur is present  Pulmonary/Chest: Effort normal and breath sounds normal. No respiratory distress.  Abdominal: Soft. He exhibits no distension. There is no tenderness.  Musculoskeletal: He exhibits no edema and no tenderness.  Neurological: He is alert.  Skin: Skin is warm and dry.  Psychiatric: He has a normal mood and affect. His behavior is normal. Thought content normal.    ED Course  Procedures (including critical care time)  DIAGNOSTIC STUDIES: Oxygen Saturation is 97% on room air, adequate by my interpretation.    COORDINATION OF CARE:  12:52-Discussed planned course of treatment with the patient including IV fluids, Zofran, a chest x-ray, UA and blood work, who is agreeable at this time.   13:00-Medication Orders: Ondansetron (Zofran) injection 4 mg-once; Sodium chloride 0.9% bolus 1,000 mL-once  Labs Reviewed  CBC WITH  DIFFERENTIAL - Abnormal; Notable for the following:    WBC 17.6 (*)    RBC 2.83 (*)    Hemoglobin 9.4 (*)    HCT 28.0 (*)    Neutrophils Relative 86 (*)    Neutro Abs 15.1 (*)    Lymphocytes Relative 3 (*)    Lymphs Abs 0.6 (*)    Monocytes Absolute 1.9 (*)    All other components within normal limits  BASIC METABOLIC PANEL - Abnormal; Notable for the following:    Glucose, Bld 148 (*)    GFR calc non Af Amer 64 (*)    GFR calc Af Amer 75 (*)    All other components within normal limits  URINALYSIS, ROUTINE W REFLEX MICROSCOPIC - Abnormal; Notable for the following:    APPearance HAZY (*)    Bilirubin Urine SMALL (*)    All other components within normal limits   Dg Chest  2 View  05/09/2012  *RADIOLOGY REPORT*  Clinical Data: Shortness of breath, weakness and fever.  Fall.  CHEST - 2 VIEW  Comparison: 06/03/2011 and prior chest radiographs dating back to 05/20/2005.  Findings: Cardiomegaly is again noted. Mild peribronchial thickening and minimal basilar atelectasis/scarring are unchanged. There is no evidence of focal airspace disease, pulmonary edema, suspicious pulmonary nodule/mass, pleural effusion, or pneumothorax. No acute bony abnormalities are identified.  IMPRESSION: Cardiomegaly without evidence of acute cardiopulmonary disease.  Unchanged mild chronic peribronchial thickening and minimal basilar atelectasis/scarring.   Original Report Authenticated By: Harmon Pier, M.D.    I personally reviewed the patient's radiology images from the PACS system.   EKG:  Rhythm: normal sinus Vent. rate 71 BPM PR interval 206 ms QRS duration 86 ms QT/QTc 398/432 ms ST segments: NS ST changes lateral precordial leads    1. Generalized weakness       MDM  41-year-old male with generalized weakness. Etiology not clear. He is anemic, but appears to be near his baseline. Leukocytosis, but this is nonspecific. Is afebrile. He has benign abdominal exam. No increased work of breathing.  Chest x-ray wis without any acute process. EKG stable. Patient currently states he feels better. Requesting to eat. I feel he is safe for discharge at this time. Return precautions discussed. Outpatient followup.   I personally preformed the services scribed in my presence. The recorded information has been reviewed is accurate. Raeford Razor, MD.      Raeford Razor, MD 05/13/12 1320

## 2012-06-15 ENCOUNTER — Ambulatory Visit (INDEPENDENT_AMBULATORY_CARE_PROVIDER_SITE_OTHER): Payer: Medicare Other | Admitting: Diagnostic Neuroimaging

## 2012-06-15 ENCOUNTER — Encounter: Payer: Self-pay | Admitting: Diagnostic Neuroimaging

## 2012-06-15 VITALS — BP 126/59 | HR 67 | Temp 98.2°F | Ht 68.0 in | Wt 190.0 lb

## 2012-06-15 DIAGNOSIS — H4922 Sixth [abducent] nerve palsy, left eye: Secondary | ICD-10-CM

## 2012-06-15 DIAGNOSIS — G7000 Myasthenia gravis without (acute) exacerbation: Secondary | ICD-10-CM

## 2012-06-15 DIAGNOSIS — H532 Diplopia: Secondary | ICD-10-CM

## 2012-06-15 DIAGNOSIS — H4920 Sixth [abducent] nerve palsy, unspecified eye: Secondary | ICD-10-CM

## 2012-06-15 NOTE — Progress Notes (Signed)
[facility]  PATIENT: Howard [name] DOB: 1929-09-20  REFERRING CLINICIAN:  HISTORY FROM: patient REASON FOR VISIT: follow up   HISTORICAL  CHIEF COMPLAINT:  Chief Complaint  Patient presents with  . Myasthenia Gravis    HISTORY OF PRESENT ILLNESS:   UPDATE 06/14/12: Since last visit, doing about the same. No more double vision. Has tapered off mestinon, without any worsening of symptoms. Not driving much anymore. Memory poor. Fell last week at home. Not eating much.  UPDATE 12/23/11: Doing the same. Left eye drooping. Int double vision. Mestinon not helping even at 60mg  TID.  UPDATE 06/24/11: No changes. Reviewed dx and tx options again. Still with diarrhea. No breathing issues. Had "mild heart attack" earlier in April 2013.   UPDATE 06/19/10: Doing the same.  No dysphagia, dysarthria.  occ double vision when he wakes up.  Tolerating pyridostigmine.    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  He is doing well on pyridostigmine 60mg  BID.  He has intermittent swallowing difficulties denies shortness of breath, double vision or generalized weakness.   PRIOR HPI:  Followed here since 2004. At that time he presented with a sudden onset of horizontal diplopia, and exam suggested a left sixth nerve palsy. He had an MRI, which apparently was unrevealing, although that result is not available to me. He was then lost to followup. Apparently at some point he was referred to [facility], and ultimately saw Dr. Carilyn Goodpasture. He had serologic testing for acetylcholine receptor binding antibodies, which was negative, and then underwent single fiber EMG performed by Dr. Alphonzo Dublin which was felt to be abnormal, demonstrating increased jitter and some blocking suggestive of a neuromuscular junction defect, although data was limited by the patient's ability to follow directions. He returns here because he wishes  to have a care provider closer to his home in [place]. His biggest trouble is drooping of the left eye, which sometimes closes. He doesn't think his right eye droops. He has occasional diplopia, which is especially bothersome when he is reading. He has always denied jaw fatigue, dysarthria, dysphagia, or weakness of the limbs.  Today:  has held Mestinon due to diarrhea, but not clearly better- getting colonoscopy later this month- thinks eyes improved a little bit- got cortisone in R knee last wk-  REVIEW OF SYSTEMS: Full 14 system review of systems performed and notable only for shortness of breath.  ALLERGIES: No Known Allergies  HOME MEDICATIONS: Outpatient Prescriptions Prior to Visit  Medication Sig Dispense Refill  . albuterol (PROAIR HFA) 108 (90 BASE) MCG/ACT inhaler Inhale 2 puffs into the lungs every 4 (four) hours as needed. For shortness of breath      . aspirin EC 81 MG tablet Take 81 mg by mouth daily.      . carvedilol (COREG) 6.25 MG tablet Take 6.25 mg by mouth 2 (two) times daily.      . cholestyramine (QUESTRAN) 4 GM/DOSE powder Take 2 g by mouth daily.       . Cinnamon 500 MG capsule Take 1,000 mg by mouth daily.      . clopidogrel (PLAVIX) 75 MG tablet Take 1 tablet (75 mg total) by mouth daily.  90 tablet  3  . dicyclomine (BENTYL) 10 MG capsule Take 20 mg by mouth 4 (four) times daily as needed (cramps).       . doxazosin (CARDURA) 2 MG tablet Take 2 mg by mouth at bedtime.      Marland Kitchen  Ferrous Sulfate (IRON) 325 (65 FE) MG TABS Take 325 mg by mouth 2 (two) times daily.  60 each  0  . fish oil-omega-3 fatty acids 1000 MG capsule Take 1 g by mouth daily.       . furosemide (LASIX) 20 MG tablet Take 20 mg by mouth 2 (two) times daily.       Marland Kitchen GLIPIZIDE XL 5 MG 24 hr tablet Take 5 mg by mouth daily.       Marland Kitchen loperamide (IMODIUM) 2 MG capsule Take 2 mg by mouth as needed. For runny stools      . LORazepam (ATIVAN) 0.5 MG tablet Take 0.5 mg by mouth every 4 (four) hours as  needed. For anxiety       . Multiple Vitamins-Minerals (MULTIVITAMIN) tablet Take 1 tablet by mouth daily.  30 tablet  0  . nitroGLYCERIN (NITROSTAT) 0.4 MG SL tablet Place 1 tablet (0.4 mg total) under the tongue every 5 (five) minutes as needed for chest pain.  25 tablet  3  . ondansetron (ZOFRAN) 4 MG tablet Take 4 mg by mouth every 6 (six) hours as needed for nausea. nausea      . pantoprazole (PROTONIX) 40 MG tablet Take 40 mg by mouth daily.      . potassium chloride SA (K-DUR,KLOR-CON) 20 MEQ tablet Take 20 mEq by mouth daily.       . simvastatin (ZOCOR) 40 MG tablet Take 40 mg by mouth every evening.      . pyridostigmine (MESTINON) 60 MG tablet Take 60 mg by mouth 2 (two) times daily.       No facility-administered medications prior to visit.    PAST MEDICAL HISTORY: Past Medical History  Diagnosis Date  . Hypertension   . Hyperlipidemia   . Adenocarcinoma 2003    COLON  . PVD (peripheral vascular disease)     ABI 0.78 RIGHT AND 0.80 LEFT 4/11  . Osteoarthritis     OF KNEES  . Kidney stone   . Schatzki's ring 08/05/10    egd with Dr. Jena Gauss  . Hiatal hernia 08/05/10    egd with Dr. Jena Gauss  . Esophageal dysmotility   . Microscopic colitis   . Internal hemorrhoids   . Diverticula of colon   . CHF (congestive heart failure)   . Coronary artery stenosis     a. 06/2011 Cath: LM 20, LAD min irregs, D1 95 - small,  LCX 60p, RCA 54m, 50d, PLA 50ost;  b. 06/2011 PCI/Rota  RCA  -> 3.0x6mm Promus Element DES  . Angina   . COPD (chronic obstructive pulmonary disease)   . Asthma   . Pneumonia ~ 2005; 09/2005  . Shortness of breath 07/17/11    "laying down"  . Type II diabetes mellitus   . GERD (gastroesophageal reflux disease)   . Anxiety   . Ischemic cardiomyopathy     a. 05/2011 Echo: EF 45-50%, inf/post HK  . H. pylori infection 03/01/12    treated with prevpac    PAST SURGICAL HISTORY: Past Surgical History  Procedure Laterality Date  . Hemicolectomy  2003  . Appendectomy     . Colonoscopy  04/2001    Dr. Karilyn Cota- colon carcinoma  . Esophagogastroduodenoscopy  04/2001    Dr. Karilyn Cota  . Esophagogastroduodenoscopy  08/05/10    Dr. Rinaldo Ratel ring, hiatal hernia  . Colonoscopy  09/04/09    Dr. Chauncy Passy, hemorrhoids  . Atherectomy  07/17/11  . Coronary angioplasty  07/17/11  rotablator  . Colon surgery    . Cataract extraction, bilateral    . Esophagogastroduodenoscopy  03/01/2012    Procedure: ESOPHAGOGASTRODUODENOSCOPY (EGD);  Surgeon: Corbin Ade, MD;  [facility];  Service: Endoscopy;  Laterality: N/A;  1:15  . Colonoscopy  03/11/2012    Procedure: COLONOSCOPY;  Surgeon: Corbin Ade, MD;  [facility];  Service: Endoscopy;  Laterality: N/A;  10:30    FAMILY HISTORY: Family History  Problem Relation Age of Onset  . Coronary artery disease Father   . Coronary artery disease Brother     CABG  . Diabetes Brother   . Coronary artery disease Brother   . Heart Problems Mother     SOCIAL HISTORY:  History   Social History  . Marital Status: Married    Spouse Name: N/A    Number of Children: N/A  . Years of Education: N/A   Occupational History  . Not on file.   Social History Main Topics  . Smoking status: Former Smoker -- 20 years    Types: Cigarettes    Quit date: 02/25/2008  . Smokeless tobacco: Never Used  . Alcohol Use: No  . Drug Use: No  . Sexually Active: Not Currently   Other Topics Concern  . Not on file   Social History Narrative   Lives in [place]. Married. Wife in [place].   Son lives with him   Caffeine Use-     PHYSICAL EXAM  Filed Vitals:   06/15/12 1407  BP: 126/59  Pulse: 67  Temp: 98.2 F (36.8 C)  TempSrc: Oral  Height: 5\' 8"  (1.727 m)  Weight: 190 lb (86.183 kg)   Body mass index is 28.9 kg/(m^2).  EXAM: General: Patient is awake, alert and in no acute distress.  Well developed and groomed. Neck: Neck is supple. Cardiovascular: Heart is regular rate and rhythm  with no murmurs.  Neurologic Exam  Mental Status: Awake, alert.  Language is fluent and comprehension intact. Cranial Nerves: Pupils are equal and reactive to light.  Visual fields are full to confrontation.  Conjugate eye movements are full and symmetric; EXCEPT LIMITED LEFT EYE ABDUCTION AND LEFT PTOSIS.  WHEN HE CLOSES HIS RIGHT EYE, HE IS ABLE TO OPEN HIS LEFT EYE NORMALLY. Facial sensation and strength are symmetric.  Hearing is intact.  Palate elevated symmetrically and uvula is midline. Shoulder shug is symmetric.  Tongue is midline.  Motor: Normal bulk and tone.  Full strength in the upper and lower extremities.  No pronator drift. Sensory: Intact and symmetric to light touch. Coordination: No ataxia or dysmetria on finger-nose or rapid alternating movement testing. Gait and Station: Narrow based gait. LIMPS. BILATERAL KNEE BRACES. UNABLE TO STAND WITH EYES OPEN AND FEET TOGETHER. Reflexes: Deep tendon reflexes in the upper and lower extremity are present and symmetric; ABSENT AT ANKLES.   DIAGNOSTIC DATA (LABS, IMAGING, TESTING) - I reviewed patient records, labs, notes, testing and imaging myself where available.  Lab Results  Component Value Date   WBC 17.6* 05/09/2012   HGB 9.4* 05/09/2012   HCT 28.0* 05/09/2012   MCV 98.9 05/09/2012   PLT 150 05/09/2012      Component Value Date/Time   NA 135 05/09/2012 1027   K 3.7 05/09/2012 1027   CL 101 05/09/2012 1027   CO2 25 05/09/2012 1027   GLUCOSE 148* 05/09/2012 1027   BUN 23 05/09/2012 1027   CREATININE 1.05 05/09/2012 1027   CREATININE 0.85 07/07/2011 1419  CALCIUM 8.7 05/09/2012 1027   PROT 6.0 06/01/2011 1145   ALBUMIN 3.1* 06/01/2011 1145   AST 27 06/01/2011 1145   ALT 14 06/01/2011 1145   ALKPHOS 54 06/01/2011 1145   BILITOT 0.3 06/01/2011 1145   GFRNONAA 64* 05/09/2012 1027   GFRAA 75* 05/09/2012 1027   No results found for this basename: CHOL, HDL, LDLCALC, LDLDIRECT, TRIG, CHOLHDL   Lab Results  Component Value Date   HGBA1C 6.7*  06/01/2011   No results found for this basename: VITAMINB12   No results found for this basename: TSH      ASSESSMENT AND PLAN   79 year old male with previous diagnosis of seronegative, ocular myasthenia gravis. In retrospect, he had sudden onset of double vision in 2004, and initally diagnosed with left CN6 palsy. Exam now is consistent with left lateral rectus weakness, and involuntary left eye closure to supress double vision. His left ptosis resolves when he closes the right eye.   Therefore, I do not think myasthenia gravis is the correct diagnosis anymore. Prior dx of ocular MG was not solid (sudden onset sxs, seronegative, borderline findings on single fiber EMG, lack of response to mestinon).   PLAN: 1. Stay off of mestinon 2. wear eye patch (alternate eyes throughout day) to help suppress double vision 3. Follow up as needed  Suanne Marker, MD 06/15/2012, 2:39 PM Certified in Neurology, Neurophysiology and Neuroimaging  [facility] [address], [address] [place], [place] [address] [phone]

## 2012-06-15 NOTE — Patient Instructions (Signed)
Follow up as needed

## 2012-07-15 ENCOUNTER — Telehealth: Payer: Self-pay | Admitting: *Deleted

## 2012-07-15 MED ORDER — DOXAZOSIN MESYLATE 2 MG PO TABS: 2.0000 mg | ORAL_TABLET | Freq: Every day | ORAL | Status: DC

## 2012-07-15 NOTE — Telephone Encounter (Signed)
Incoming fax of patient medication refill request noted per patient pharmacy. FOR CARDURA FROM [facility] rx sent to pharmacy by e-script

## 2012-07-20 ENCOUNTER — Other Ambulatory Visit: Payer: Self-pay

## 2012-07-20 ENCOUNTER — Encounter: Payer: Self-pay | Admitting: General Practice

## 2012-07-20 ENCOUNTER — Telehealth: Payer: Self-pay

## 2012-07-20 DIAGNOSIS — R197 Diarrhea, unspecified: Secondary | ICD-10-CM

## 2012-07-20 NOTE — Telephone Encounter (Signed)
Pt called office- he started having diarrhea again last week, some dark stool at times. He feels like it is d/t his nerves (because of his wife) he had ov with Dr. Renard Matter last week and he recommended he take the Loperamide prn. Pt calling us just to make sure it is all right. He is going back for a follow up appt with Dr. Renard Matter next week. He said the loperamide is helping. And other than the diarrhea he feels ok. Please advise.

## 2012-07-20 NOTE — Telephone Encounter (Signed)
He has a history of microscopic colitis and IBS-D.  I recommend getting Cdiff PCR, stool culture, Giardia. He was recently on abx for H.pylori gastritis.  Needs OV after stool studies to set up TCS due to heme positive stool.

## 2012-07-20 NOTE — Telephone Encounter (Signed)
Pt is aware of OV with RMR ONLY on 09/03/12 at 8 am

## 2012-07-20 NOTE — Telephone Encounter (Signed)
Pt is aware, will pick up stool containers today.  Darl Pikes, please schedule ov.

## 2012-07-21 ENCOUNTER — Telehealth: Payer: Self-pay | Admitting: Internal Medicine

## 2012-07-21 NOTE — Telephone Encounter (Signed)
Opened in Error.

## 2012-07-22 ENCOUNTER — Encounter: Payer: Self-pay | Admitting: Internal Medicine

## 2012-07-22 ENCOUNTER — Telehealth: Payer: Self-pay

## 2012-07-22 LAB — GIARDIA ANTIGEN: Giardia Screen (EIA): NEGATIVE

## 2012-07-22 LAB — CLOSTRIDIUM DIFFICILE BY PCR: Toxigenic C. Difficile by PCR: DETECTED — CR

## 2012-07-22 MED ORDER — METRONIDAZOLE 500 MG PO TABS: 500.0000 mg | ORAL_TABLET | Freq: Three times a day (TID) | ORAL | Status: DC

## 2012-07-22 NOTE — Telephone Encounter (Signed)
Noted. Thanks.

## 2012-07-22 NOTE — Telephone Encounter (Signed)
Pt aware.

## 2012-07-22 NOTE — Telephone Encounter (Signed)
Spoke with RMR- he advised me to routed to [facility] for recommendations.

## 2012-07-22 NOTE — Telephone Encounter (Signed)
Howard [name] at [facility] called- pt has a +cdiff. AS ordered but is not here today.    Please advise.

## 2012-07-22 NOTE — Telephone Encounter (Signed)
Start Flagyl. Hold fish oil while on flagyl. Avoid etoh. Hold imodium. Call Monday with PR.   Route to [place] for [place].

## 2012-07-26 MED ORDER — VANCOMYCIN HCL 125 MG PO CAPS: 125.0000 mg | ORAL_CAPSULE | Freq: Four times a day (QID) | ORAL | Status: DC

## 2012-07-26 NOTE — Telephone Encounter (Signed)
Pt called this am with a progress report- he is still having diarrhea, the flagyl has not helped. He also stated he has been dizzy since he started taking it. I advised pt to stop taking it. He is requesting something different sent in to the pharmacy.

## 2012-07-26 NOTE — Telephone Encounter (Signed)
I have sent prescription for Vancomycin X 14 days to pharmacy. Flagyl has been stopped due to dizziness.

## 2012-07-26 NOTE — Addendum Note (Signed)
Addended by: Nira Retort on: 07/26/2012 11:41 AM   Modules accepted: Orders

## 2012-08-02 ENCOUNTER — Telehealth: Payer: Self-pay

## 2012-08-02 NOTE — Telephone Encounter (Signed)
Pt called- he is taking the vancomycin and his diarrhea has stopped, he is having somewhat normal stools now but they are soft. Pt is c/o dizziness when he stands up. It doesn't happen all the time, just with standing. He is not having any breathing problems, no rashes etc. He states he is drinking plenty of fluids. He has about 1 week left of medication to take. He wants to know what he should do. Please advise.

## 2012-08-02 NOTE — Telephone Encounter (Signed)
That is perfect Thanks 

## 2012-08-02 NOTE — Telephone Encounter (Signed)
Pt is already scheduled for 09/03/12 with RMR. Does it need to be any sooner than this?

## 2012-08-02 NOTE — Telephone Encounter (Signed)
Started last few days. I asked patient to stay on Vancomycin. I explained to him that I doubt Vancomycin is causing dizziness when standing. We had already changed from Flagyl to Vanc. I informed him that it was VERY important to take the full dose of medication. He then told me he is taking Meclizine, and he feels this made the dizziness worse. I asked him to follow-up with his PCP soon, as I do not feel it is a GI issue necessarily.   He would like to see Dr. Jena Gauss in the near future for routine follow-up. Please schedule him in a non-urgent slot with RMR, f/u of Cdiff.

## 2012-08-11 ENCOUNTER — Telehealth: Payer: Self-pay | Admitting: Internal Medicine

## 2012-08-11 NOTE — Telephone Encounter (Signed)
Spoke with pt- he is feeling better. He had a single episode of diarrhea today and wants to know if we think he needs to take some more vancomycin?   He finished the vanc yesterday. (AS was treating for Cdiff)  He also wants to know if he should restart the fish oil and the immodium ?   Please advise.

## 2012-08-11 NOTE — Telephone Encounter (Signed)
Would wait 2 weeks before resuming fish oil . Would use Imodium as needed for occasional loose stool, however,  I would expect the diarrhea related to C. difficile to taper on within the next day or so. If not, he is to let us know. No further vancomycin at this time. However, if diarrhea persists into next week, he should let us know

## 2012-08-11 NOTE — Telephone Encounter (Signed)
Pt is aware.  

## 2012-08-11 NOTE — Telephone Encounter (Signed)
Howard [name] please call [name] regarding his medications he has a question that he says needs to be answered urgently, please advise

## 2012-08-17 ENCOUNTER — Encounter (HOSPITAL_COMMUNITY): Payer: Self-pay | Admitting: *Deleted

## 2012-08-17 ENCOUNTER — Emergency Department (HOSPITAL_COMMUNITY): Payer: Medicare Other

## 2012-08-17 ENCOUNTER — Emergency Department (HOSPITAL_COMMUNITY)
Admission: EM | Admit: 2012-08-17 | Discharge: 2012-08-17 | Disposition: A | Discharge status: Home / Self Care | Payer: Medicare Other | Attending: Emergency Medicine | Admitting: Emergency Medicine

## 2012-08-17 ENCOUNTER — Other Ambulatory Visit: Payer: Self-pay | Admitting: Gastroenterology

## 2012-08-17 DIAGNOSIS — R509 Fever, unspecified: Secondary | ICD-10-CM

## 2012-08-17 DIAGNOSIS — R0602 Shortness of breath: Secondary | ICD-10-CM

## 2012-08-17 DIAGNOSIS — J4489 Other specified chronic obstructive pulmonary disease: Secondary | ICD-10-CM | POA: Insufficient documentation

## 2012-08-17 DIAGNOSIS — Z8701 Personal history of pneumonia (recurrent): Secondary | ICD-10-CM | POA: Insufficient documentation

## 2012-08-17 DIAGNOSIS — E119 Type 2 diabetes mellitus without complications: Secondary | ICD-10-CM | POA: Insufficient documentation

## 2012-08-17 DIAGNOSIS — Z8679 Personal history of other diseases of the circulatory system: Secondary | ICD-10-CM | POA: Insufficient documentation

## 2012-08-17 DIAGNOSIS — Z79899 Other long term (current) drug therapy: Secondary | ICD-10-CM | POA: Insufficient documentation

## 2012-08-17 DIAGNOSIS — Z8619 Personal history of other infectious and parasitic diseases: Secondary | ICD-10-CM | POA: Insufficient documentation

## 2012-08-17 DIAGNOSIS — I209 Angina pectoris, unspecified: Secondary | ICD-10-CM | POA: Insufficient documentation

## 2012-08-17 DIAGNOSIS — M1710 Unilateral primary osteoarthritis, unspecified knee: Secondary | ICD-10-CM | POA: Insufficient documentation

## 2012-08-17 DIAGNOSIS — Z87738 Personal history of other specified (corrected) congenital malformations of digestive system: Secondary | ICD-10-CM | POA: Insufficient documentation

## 2012-08-17 DIAGNOSIS — Z87442 Personal history of urinary calculi: Secondary | ICD-10-CM | POA: Insufficient documentation

## 2012-08-17 DIAGNOSIS — K219 Gastro-esophageal reflux disease without esophagitis: Secondary | ICD-10-CM | POA: Insufficient documentation

## 2012-08-17 DIAGNOSIS — R5381 Other malaise: Secondary | ICD-10-CM | POA: Insufficient documentation

## 2012-08-17 DIAGNOSIS — I509 Heart failure, unspecified: Secondary | ICD-10-CM | POA: Insufficient documentation

## 2012-08-17 DIAGNOSIS — J449 Chronic obstructive pulmonary disease, unspecified: Secondary | ICD-10-CM | POA: Insufficient documentation

## 2012-08-17 DIAGNOSIS — Z7982 Long term (current) use of aspirin: Secondary | ICD-10-CM | POA: Insufficient documentation

## 2012-08-17 DIAGNOSIS — J45909 Unspecified asthma, uncomplicated: Secondary | ICD-10-CM | POA: Insufficient documentation

## 2012-08-17 DIAGNOSIS — R5383 Other fatigue: Secondary | ICD-10-CM | POA: Insufficient documentation

## 2012-08-17 DIAGNOSIS — Z87891 Personal history of nicotine dependence: Secondary | ICD-10-CM | POA: Insufficient documentation

## 2012-08-17 DIAGNOSIS — Z8719 Personal history of other diseases of the digestive system: Secondary | ICD-10-CM | POA: Insufficient documentation

## 2012-08-17 DIAGNOSIS — F411 Generalized anxiety disorder: Secondary | ICD-10-CM | POA: Insufficient documentation

## 2012-08-17 DIAGNOSIS — IMO0002 Reserved for concepts with insufficient information to code with codable children: Secondary | ICD-10-CM | POA: Insufficient documentation

## 2012-08-17 DIAGNOSIS — E785 Hyperlipidemia, unspecified: Secondary | ICD-10-CM | POA: Insufficient documentation

## 2012-08-17 DIAGNOSIS — I10 Essential (primary) hypertension: Secondary | ICD-10-CM | POA: Insufficient documentation

## 2012-08-17 LAB — URINALYSIS, ROUTINE W REFLEX MICROSCOPIC
Bilirubin Urine: NEGATIVE
Glucose, UA: NEGATIVE mg/dL
Hgb urine dipstick: NEGATIVE
Ketones, ur: NEGATIVE mg/dL
Leukocytes, UA: NEGATIVE
Nitrite: NEGATIVE
Protein, ur: NEGATIVE mg/dL
Specific Gravity, Urine: 1.02 (ref 1.005–1.030)
Urobilinogen, UA: 0.2 mg/dL (ref 0.0–1.0)
pH: 5.5 (ref 5.0–8.0)

## 2012-08-17 LAB — BASIC METABOLIC PANEL
BUN: 22 mg/dL (ref 6–23)
CO2: 26 mEq/L (ref 19–32)
Chloride: 102 mEq/L (ref 96–112)
Creatinine, Ser: 1.05 mg/dL (ref 0.50–1.35)

## 2012-08-17 LAB — CBC WITH DIFFERENTIAL/PLATELET
HCT: 29.4 % — ABNORMAL LOW (ref 39.0–52.0)
Hemoglobin: 9.8 g/dL — ABNORMAL LOW (ref 13.0–17.0)
Lymphocytes Relative: 6 % — ABNORMAL LOW (ref 12–46)
Monocytes Absolute: 1.3 10*3/uL — ABNORMAL HIGH (ref 0.1–1.0)
Monocytes Relative: 13 % — ABNORMAL HIGH (ref 3–12)
Neutro Abs: 8 10*3/uL — ABNORMAL HIGH (ref 1.7–7.7)
WBC: 10.1 10*3/uL (ref 4.0–10.5)

## 2012-08-17 MED ORDER — FUROSEMIDE 10 MG/ML IJ SOLN
40.0000 mg | INTRAMUSCULAR | Status: AC
  Administered 2012-08-17: 40 mg via INTRAVENOUS
  Filled 2012-08-17: qty 4

## 2012-08-17 MED ORDER — ACETAMINOPHEN 325 MG PO TABS
650.0000 mg | ORAL_TABLET | Freq: Once | ORAL | Status: AC
  Administered 2012-08-17: 650 mg via ORAL
  Filled 2012-08-17: qty 2

## 2012-08-17 MED ORDER — ALBUTEROL SULFATE (5 MG/ML) 0.5% IN NEBU
2.5000 mg | INHALATION_SOLUTION | Freq: Once | RESPIRATORY_TRACT | Status: AC
  Administered 2012-08-17: 2.5 mg via RESPIRATORY_TRACT
  Filled 2012-08-17: qty 0.5

## 2012-08-17 MED ORDER — SODIUM CHLORIDE 0.9 % IV BOLUS (SEPSIS)
500.0000 mL | INTRAVENOUS | Status: AC
  Administered 2012-08-17: 500 mL via INTRAVENOUS

## 2012-08-17 NOTE — ED Notes (Signed)
EMS called to pt's house for SOB, pt states his legs are too weak for him to get up to go to the bathroom.  This is a chronic problem.

## 2012-08-17 NOTE — ED Provider Notes (Signed)
History    CSN: [id number] Arrival date & time 08/17/12  5284  First MD Initiated Contact with Patient 08/17/12 0540     Chief Complaint  Patient presents with  . Shortness of Breath  . Weakness   (Consider location/radiation/quality/duration/timing/severity/associated sxs/prior Treatment) HPI HPI Comments: Howard [name] is a 79 y.o. male brought in by ambulance, who presents to the Emergency Department complaining of awakening at 3 AM very cold with a chill. He was too weak to get up. The patient denies fever, nausea, vomiting, chest pain. He felt shortness of breath. Called EMS for help to get up, get covers and be brought to the ER.  PCP Dr. Renard Matter Past Medical History  Diagnosis Date  . Hypertension   . Hyperlipidemia   . Adenocarcinoma 2003    COLON  . PVD (peripheral vascular disease)     ABI 0.78 RIGHT AND 0.80 LEFT 4/11  . Osteoarthritis     OF KNEES  . Kidney stone   . Schatzki's ring 08/05/10    egd with Dr. Jena Gauss  . Hiatal hernia 08/05/10    egd with Dr. Jena Gauss  . Esophageal dysmotility   . Microscopic colitis   . Internal hemorrhoids   . Diverticula of colon   . CHF (congestive heart failure)   . Coronary artery stenosis     a. 06/2011 Cath: LM 20, LAD min irregs, D1 95 - small,  LCX 60p, RCA 52m, 50d, PLA 50ost;  b. 06/2011 PCI/Rota  RCA  -> 3.0x39mm Promus Element DES  . Angina   . COPD (chronic obstructive pulmonary disease)   . Asthma   . Pneumonia ~ 2005; 09/2005  . Shortness of breath 07/17/11    "laying down"  . Type II diabetes mellitus   . GERD (gastroesophageal reflux disease)   . Anxiety   . Ischemic cardiomyopathy     a. 05/2011 Echo: EF 45-50%, inf/post HK  . H. pylori infection 03/01/12    treated with prevpac   Past Surgical History  Procedure Laterality Date  . Hemicolectomy  2003  . Appendectomy    . Colonoscopy  04/2001    Dr. Karilyn Cota- colon carcinoma  . Esophagogastroduodenoscopy  04/2001    Dr. Karilyn Cota  . Esophagogastroduodenoscopy   08/05/10    Dr. Rinaldo Ratel ring, hiatal hernia  . Colonoscopy  09/04/09    Dr. Chauncy Passy, hemorrhoids  . Atherectomy  07/17/11  . Coronary angioplasty  07/17/11    rotablator  . Colon surgery    . Cataract extraction, bilateral    . Esophagogastroduodenoscopy  03/01/2012    Procedure: ESOPHAGOGASTRODUODENOSCOPY (EGD);  Surgeon: Corbin Ade, MD;  Location: [facility];  Service: Endoscopy;  Laterality: N/A;  1:15  . Colonoscopy  03/11/2012    Procedure: COLONOSCOPY;  Surgeon: Corbin Ade, MD;  Location: [facility];  Service: Endoscopy;  Laterality: N/A;  10:30   Family History  Problem Relation Age of Onset  . Coronary artery disease Father   . Coronary artery disease Brother     CABG  . Diabetes Brother   . Coronary artery disease Brother   . Heart Problems Mother    History  Substance Use Topics  . Smoking status: Former Smoker -- 20 years    Types: Cigarettes    Quit date: 02/25/2008  . Smokeless tobacco: Never Used  . Alcohol Use: No    Review of Systems  Constitutional: Positive for fever.       10 Systems reviewed and  are negative for acute change except as noted in the HPI.  HENT: Negative for congestion.   Eyes: Negative for discharge and redness.  Respiratory: Positive for shortness of breath. Negative for cough.   Cardiovascular: Negative for chest pain.  Gastrointestinal: Negative for vomiting and abdominal pain.  Musculoskeletal: Negative for back pain.  Skin: Negative for rash.  Neurological: Positive for weakness. Negative for syncope, numbness and headaches.  Psychiatric/Behavioral:       No behavior change.    Allergies  Review of patient's allergies indicates no known allergies.  Home Medications   Current Outpatient Rx  Name  Route  Sig  Dispense  Refill  . albuterol (PROAIR HFA) 108 (90 BASE) MCG/ACT inhaler   Inhalation   Inhale 2 puffs into the lungs every 4 (four) hours as needed. For shortness of breath         .  aspirin EC 81 MG tablet   Oral   Take 81 mg by mouth daily.         . carvedilol (COREG) 6.25 MG tablet   Oral   Take 6.25 mg by mouth 2 (two) times daily.         . cholestyramine (QUESTRAN) 4 GM/DOSE powder   Oral   Take 2 g by mouth daily.          . Cinnamon 500 MG capsule   Oral   Take 1,000 mg by mouth daily.         . clopidogrel (PLAVIX) 75 MG tablet   Oral   Take 1 tablet (75 mg total) by mouth daily.   90 tablet   3   . dicyclomine (BENTYL) 10 MG capsule   Oral   Take 20 mg by mouth 4 (four) times daily as needed (cramps).          . doxazosin (CARDURA) 2 MG tablet   Oral   Take 1 tablet (2 mg total) by mouth at bedtime.   30 tablet   2   . Ferrous Sulfate (IRON) 325 (65 FE) MG TABS   Oral   Take 325 mg by mouth 2 (two) times daily.   60 each   0     Pt. Is to take for only 1 month   . fish oil-omega-3 fatty acids 1000 MG capsule   Oral   Take 1 g by mouth daily.          . furosemide (LASIX) 20 MG tablet   Oral   Take 20 mg by mouth 2 (two) times daily.          Marland Kitchen GLIPIZIDE XL 5 MG 24 hr tablet   Oral   Take 5 mg by mouth daily.          Marland Kitchen loperamide (IMODIUM) 2 MG capsule   Oral   Take 2 mg by mouth as needed. For runny stools         . LORazepam (ATIVAN) 0.5 MG tablet   Oral   Take 0.5 mg by mouth every 4 (four) hours as needed. For anxiety          . metroNIDAZOLE (FLAGYL) 500 MG tablet   Oral   Take 1 tablet (500 mg total) by mouth 3 (three) times daily. Take with food.   42 tablet   0   . Multiple Vitamins-Minerals (MULTIVITAMIN) tablet   Oral   Take 1 tablet by mouth daily.   30 tablet   0   . EXPIRED:  nitroGLYCERIN (NITROSTAT) 0.4 MG SL tablet   Sublingual   Place 1 tablet (0.4 mg total) under the tongue every 5 (five) minutes as needed for chest pain.   25 tablet   3   . ondansetron (ZOFRAN) 4 MG tablet   Oral   Take 4 mg by mouth every 6 (six) hours as needed for nausea. nausea         .  pantoprazole (PROTONIX) 40 MG tablet   Oral   Take 40 mg by mouth daily.         . potassium chloride SA (K-DUR,KLOR-CON) 20 MEQ tablet   Oral   Take 20 mEq by mouth daily.          . simvastatin (ZOCOR) 40 MG tablet   Oral   Take 40 mg by mouth every evening.         . vancomycin (VANCOCIN) 125 MG capsule   Oral   Take 1 capsule (125 mg total) by mouth 4 (four) times daily.   56 capsule   0    BP 129/49  Pulse 102  Temp(Src) 101.6 F (38.7 C) (Oral)  Ht 5\' 7"  (1.702 m)  Wt 200 lb (90.719 kg)  BMI 31.32 kg/m2  SpO2 93% Physical Exam  Nursing note and vitals reviewed. Constitutional: He appears well-developed and well-nourished.  Awake, alert, nontoxic appearance.  HENT:  Head: Normocephalic and atraumatic.  Multiple Seb Ks on face and neck  Eyes: EOM are normal. Pupils are equal, round, and reactive to light.  Neck: Neck supple.  Cardiovascular: Normal rate and intact distal pulses.   Pulmonary/Chest: Effort normal and breath sounds normal. He exhibits no tenderness.  Abdominal: Soft. Bowel sounds are normal. There is no tenderness. There is no rebound.  Musculoskeletal: He exhibits no tenderness.  Baseline ROM, no obvious new focal weakness.  Neurological:  Mental status and motor strength appears baseline for patient and situation.  Skin: No rash noted.  Psychiatric: He has a normal mood and affect.    ED Course  Procedures (including critical care time) Results for orders placed during the hospital encounter of 08/17/12  CBC WITH DIFFERENTIAL      Result Value Range   WBC 10.1  4.0 - 10.5 K/uL   RBC 2.92 (*) 4.22 - 5.81 MIL/uL   Hemoglobin 9.8 (*) 13.0 - 17.0 g/dL   HCT 16.1 (*) 09.6 - 04.5 %   MCV 100.7 (*) 78.0 - 100.0 fL   MCH 33.6  26.0 - 34.0 pg   MCHC 33.3  30.0 - 36.0 g/dL   RDW 40.9  81.1 - 91.4 %   Platelets 137 (*) 150 - 400 K/uL   Neutrophils Relative % 80 (*) 43 - 77 %   Neutro Abs 8.0 (*) 1.7 - 7.7 K/uL   Lymphocytes Relative 6 (*)  12 - 46 %   Lymphs Abs 0.6 (*) 0.7 - 4.0 K/uL   Monocytes Relative 13 (*) 3 - 12 %   Monocytes Absolute 1.3 (*) 0.1 - 1.0 K/uL   Eosinophils Relative 1  0 - 5 %   Eosinophils Absolute 0.1  0.0 - 0.7 K/uL   Basophils Relative 0  0 - 1 %   Basophils Absolute 0.0  0.0 - 0.1 K/uL  BASIC METABOLIC PANEL      Result Value Range   Sodium 139  135 - 145 mEq/L   Potassium 4.1  3.5 - 5.1 mEq/L   Chloride 102  96 - 112 mEq/L  CO2 26  19 - 32 mEq/L   Glucose, Bld 160 (*) 70 - 99 mg/dL   BUN 22  6 - 23 mg/dL   Creatinine, Ser 1.61  0.50 - 1.35 mg/dL   Calcium 8.5  8.4 - 09.6 mg/dL   GFR calc non Af Amer 64 (*) >90 mL/min   GFR calc Af Amer 75 (*) >90 mL/min  URINALYSIS, ROUTINE W REFLEX MICROSCOPIC      Result Value Range   Color, Urine YELLOW  YELLOW   APPearance CLEAR  CLEAR   Specific Gravity, Urine 1.020  1.005 - 1.030   pH 5.5  5.0 - 8.0   Glucose, UA NEGATIVE  NEGATIVE mg/dL   Hgb urine dipstick NEGATIVE  NEGATIVE   Bilirubin Urine NEGATIVE  NEGATIVE   Ketones, ur NEGATIVE  NEGATIVE mg/dL   Protein, ur NEGATIVE  NEGATIVE mg/dL   Urobilinogen, UA 0.2  0.0 - 1.0 mg/dL   Nitrite NEGATIVE  NEGATIVE   Leukocytes, UA NEGATIVE  NEGATIVE   Dg Chest Portable 1 View  08/17/2012   *RADIOLOGY REPORT*  Clinical Data: Shortness of breath, weakness.  PORTABLE CHEST - 1 VIEW  Comparison: 05/09/2012  Findings: Cardiomegaly with vascular congestion.  No confluent opacities, effusions or overt edema.  No acute bony abnormality.  IMPRESSION: Cardiomegaly, vascular congestion.   Original Report Authenticated By: Charlett Nose, M.D.   [phone] Reviewed results with patient. Ordered Lasix, albuterol. Will discharge patient home.   MDM  Patient here with fever, shortness of breath and weakness. Labs are unremarkable. Chest xray with some vascular congestion only. Given lasix, albuterol. Patient feels better. Discharged home. Pt stable in ED with no significant deterioration in condition.The patient appears  reasonably screened and/or stabilized for discharge and I doubt any other medical condition or other [facility] requiring further screening, evaluation, or treatment in the ED at this time prior to discharge.  MDM Reviewed: nursing note and vitals Interpretation: labs and x-ray     Nicoletta Dress. Colon Branch, MD 08/17/12 [phone]

## 2012-08-18 ENCOUNTER — Inpatient Hospital Stay (HOSPITAL_COMMUNITY)
Admission: EM | Admit: 2012-08-18 | Discharge: 2012-08-24 | DRG: 640 | Disposition: A | Discharge status: Skilled Nursing Facility | Payer: Medicare Other | Attending: Family Medicine | Admitting: Family Medicine

## 2012-08-18 ENCOUNTER — Encounter (HOSPITAL_COMMUNITY): Payer: Self-pay | Admitting: Pharmacist

## 2012-08-18 ENCOUNTER — Encounter (HOSPITAL_COMMUNITY): Payer: Self-pay | Admitting: *Deleted

## 2012-08-18 DIAGNOSIS — R531 Weakness: Secondary | ICD-10-CM

## 2012-08-18 DIAGNOSIS — Z7982 Long term (current) use of aspirin: Secondary | ICD-10-CM

## 2012-08-18 DIAGNOSIS — Z7902 Long term (current) use of antithrombotics/antiplatelets: Secondary | ICD-10-CM

## 2012-08-18 DIAGNOSIS — E860 Dehydration: Principal | ICD-10-CM

## 2012-08-18 DIAGNOSIS — Z9861 Coronary angioplasty status: Secondary | ICD-10-CM

## 2012-08-18 DIAGNOSIS — E876 Hypokalemia: Secondary | ICD-10-CM | POA: Diagnosis not present

## 2012-08-18 DIAGNOSIS — Y92009 Unspecified place in unspecified non-institutional (private) residence as the place of occurrence of the external cause: Secondary | ICD-10-CM

## 2012-08-18 DIAGNOSIS — A048 Other specified bacterial intestinal infections: Secondary | ICD-10-CM | POA: Diagnosis present

## 2012-08-18 DIAGNOSIS — Z9049 Acquired absence of other specified parts of digestive tract: Secondary | ICD-10-CM

## 2012-08-18 DIAGNOSIS — I739 Peripheral vascular disease, unspecified: Secondary | ICD-10-CM | POA: Diagnosis present

## 2012-08-18 DIAGNOSIS — H02409 Unspecified ptosis of unspecified eyelid: Secondary | ICD-10-CM | POA: Diagnosis present

## 2012-08-18 DIAGNOSIS — J449 Chronic obstructive pulmonary disease, unspecified: Secondary | ICD-10-CM | POA: Diagnosis present

## 2012-08-18 DIAGNOSIS — I5023 Acute on chronic systolic (congestive) heart failure: Secondary | ICD-10-CM | POA: Diagnosis present

## 2012-08-18 DIAGNOSIS — W19XXXA Unspecified fall, initial encounter: Secondary | ICD-10-CM

## 2012-08-18 DIAGNOSIS — I509 Heart failure, unspecified: Secondary | ICD-10-CM | POA: Diagnosis present

## 2012-08-18 DIAGNOSIS — M1710 Unilateral primary osteoarthritis, unspecified knee: Secondary | ICD-10-CM | POA: Diagnosis present

## 2012-08-18 DIAGNOSIS — Z87891 Personal history of nicotine dependence: Secondary | ICD-10-CM

## 2012-08-18 DIAGNOSIS — K219 Gastro-esophageal reflux disease without esophagitis: Secondary | ICD-10-CM | POA: Diagnosis present

## 2012-08-18 DIAGNOSIS — I10 Essential (primary) hypertension: Secondary | ICD-10-CM | POA: Diagnosis present

## 2012-08-18 DIAGNOSIS — D696 Thrombocytopenia, unspecified: Secondary | ICD-10-CM | POA: Diagnosis present

## 2012-08-18 DIAGNOSIS — D509 Iron deficiency anemia, unspecified: Secondary | ICD-10-CM | POA: Diagnosis present

## 2012-08-18 DIAGNOSIS — R0602 Shortness of breath: Secondary | ICD-10-CM

## 2012-08-18 DIAGNOSIS — F411 Generalized anxiety disorder: Secondary | ICD-10-CM | POA: Diagnosis present

## 2012-08-18 DIAGNOSIS — E119 Type 2 diabetes mellitus without complications: Secondary | ICD-10-CM

## 2012-08-18 DIAGNOSIS — Z79899 Other long term (current) drug therapy: Secondary | ICD-10-CM

## 2012-08-18 DIAGNOSIS — Z85038 Personal history of other malignant neoplasm of large intestine: Secondary | ICD-10-CM

## 2012-08-18 DIAGNOSIS — D638 Anemia in other chronic diseases classified elsewhere: Secondary | ICD-10-CM | POA: Diagnosis present

## 2012-08-18 DIAGNOSIS — J4489 Other specified chronic obstructive pulmonary disease: Secondary | ICD-10-CM | POA: Diagnosis present

## 2012-08-18 DIAGNOSIS — K2960 Other gastritis without bleeding: Secondary | ICD-10-CM | POA: Diagnosis present

## 2012-08-18 DIAGNOSIS — E785 Hyperlipidemia, unspecified: Secondary | ICD-10-CM | POA: Diagnosis present

## 2012-08-18 DIAGNOSIS — I2510 Atherosclerotic heart disease of native coronary artery without angina pectoris: Secondary | ICD-10-CM

## 2012-08-18 DIAGNOSIS — K589 Irritable bowel syndrome without diarrhea: Secondary | ICD-10-CM | POA: Diagnosis present

## 2012-08-18 DIAGNOSIS — I2589 Other forms of chronic ischemic heart disease: Secondary | ICD-10-CM | POA: Diagnosis present

## 2012-08-18 DIAGNOSIS — R197 Diarrhea, unspecified: Secondary | ICD-10-CM | POA: Diagnosis present

## 2012-08-18 LAB — PRO B NATRIURETIC PEPTIDE: Pro B Natriuretic peptide (BNP): 6539 pg/mL — ABNORMAL HIGH (ref 0–450)

## 2012-08-18 LAB — CBC
HCT: 31.8 % — ABNORMAL LOW (ref 39.0–52.0)
Hemoglobin: 10.6 g/dL — ABNORMAL LOW (ref 13.0–17.0)
RBC: 3.17 MIL/uL — ABNORMAL LOW (ref 4.22–5.81)
WBC: 15 10*3/uL — ABNORMAL HIGH (ref 4.0–10.5)

## 2012-08-18 LAB — BASIC METABOLIC PANEL
BUN: 28 mg/dL — ABNORMAL HIGH (ref 6–23)
Chloride: 104 mEq/L (ref 96–112)
Glucose, Bld: 185 mg/dL — ABNORMAL HIGH (ref 70–99)
Potassium: 3.6 mEq/L (ref 3.5–5.1)
Sodium: 141 mEq/L (ref 135–145)

## 2012-08-18 MED ORDER — SIMVASTATIN 20 MG PO TABS
40.0000 mg | ORAL_TABLET | Freq: Every evening | ORAL | Status: DC
  Administered 2012-08-18 – 2012-08-23 (×6): 40 mg via ORAL
  Filled 2012-08-18 (×6): qty 2

## 2012-08-18 MED ORDER — ONDANSETRON HCL 4 MG PO TABS: 4.0000 mg | ORAL_TABLET | Freq: Four times a day (QID) | ORAL | Status: DC | PRN

## 2012-08-18 MED ORDER — CLOPIDOGREL BISULFATE 75 MG PO TABS
75.0000 mg | ORAL_TABLET | Freq: Every day | ORAL | Status: DC
  Administered 2012-08-18 – 2012-08-20 (×3): 75 mg via ORAL
  Filled 2012-08-18 (×3): qty 1

## 2012-08-18 MED ORDER — LORAZEPAM 0.5 MG PO TABS
0.5000 mg | ORAL_TABLET | ORAL | Status: DC | PRN
  Administered 2012-08-21 – 2012-08-22 (×2): 0.5 mg via ORAL
  Filled 2012-08-18 (×3): qty 1

## 2012-08-18 MED ORDER — SODIUM CHLORIDE 0.9 % IV SOLN
INTRAVENOUS | Status: DC
  Administered 2012-08-19 – 2012-08-22 (×4): via INTRAVENOUS

## 2012-08-18 MED ORDER — ASPIRIN EC 81 MG PO TBEC
81.0000 mg | DELAYED_RELEASE_TABLET | Freq: Every day | ORAL | Status: DC
  Administered 2012-08-18 – 2012-08-24 (×7): 81 mg via ORAL
  Filled 2012-08-18 (×7): qty 1

## 2012-08-18 MED ORDER — ACETAMINOPHEN 650 MG RE SUPP: 650.0000 mg | Freq: Four times a day (QID) | RECTAL | Status: DC | PRN

## 2012-08-18 MED ORDER — FUROSEMIDE 20 MG PO TABS
20.0000 mg | ORAL_TABLET | Freq: Two times a day (BID) | ORAL | Status: DC
  Administered 2012-08-18 – 2012-08-19 (×3): 20 mg via ORAL
  Filled 2012-08-18 (×3): qty 1

## 2012-08-18 MED ORDER — GLIPIZIDE ER 5 MG PO TB24
5.0000 mg | ORAL_TABLET | Freq: Every day | ORAL | Status: DC
  Administered 2012-08-18 – 2012-08-24 (×7): 5 mg via ORAL
  Filled 2012-08-18 (×8): qty 1

## 2012-08-18 MED ORDER — ALBUTEROL SULFATE (5 MG/ML) 0.5% IN NEBU: 2.5000 mg | INHALATION_SOLUTION | RESPIRATORY_TRACT | Status: DC | PRN

## 2012-08-18 MED ORDER — DOXAZOSIN MESYLATE 2 MG PO TABS
2.0000 mg | ORAL_TABLET | Freq: Every day | ORAL | Status: DC
  Administered 2012-08-18 – 2012-08-23 (×6): 2 mg via ORAL
  Filled 2012-08-18 (×6): qty 1

## 2012-08-18 MED ORDER — ALBUTEROL SULFATE HFA 108 (90 BASE) MCG/ACT IN AERS
2.0000 | INHALATION_SPRAY | RESPIRATORY_TRACT | Status: DC | PRN
Start: 2012-08-18 — End: 2012-08-20
  Filled 2012-08-18: qty 6.7

## 2012-08-18 MED ORDER — POTASSIUM CHLORIDE CRYS ER 20 MEQ PO TBCR
20.0000 meq | EXTENDED_RELEASE_TABLET | Freq: Every day | ORAL | Status: DC
  Administered 2012-08-18 – 2012-08-22 (×5): 20 meq via ORAL
  Filled 2012-08-18 (×5): qty 1

## 2012-08-18 MED ORDER — ALBUTEROL SULFATE (5 MG/ML) 0.5% IN NEBU
2.5000 mg | INHALATION_SOLUTION | Freq: Once | RESPIRATORY_TRACT | Status: AC
  Administered 2012-08-18: 2.5 mg via RESPIRATORY_TRACT
  Filled 2012-08-18: qty 0.5

## 2012-08-18 MED ORDER — DICYCLOMINE HCL 10 MG PO CAPS: 20.0000 mg | ORAL_CAPSULE | Freq: Four times a day (QID) | ORAL | Status: DC | PRN

## 2012-08-18 MED ORDER — SODIUM CHLORIDE 0.9 % IV SOLN
INTRAVENOUS | Status: AC
  Administered 2012-08-18: 10:00:00 via INTRAVENOUS

## 2012-08-18 MED ORDER — ACETAMINOPHEN 325 MG PO TABS
650.0000 mg | ORAL_TABLET | Freq: Four times a day (QID) | ORAL | Status: DC | PRN
  Administered 2012-08-23: 650 mg via ORAL
  Filled 2012-08-18: qty 2

## 2012-08-18 MED ORDER — ALUM & MAG HYDROXIDE-SIMETH 200-200-20 MG/5ML PO SUSP
30.0000 mL | Freq: Four times a day (QID) | ORAL | Status: DC | PRN
  Administered 2012-08-18 – 2012-08-23 (×6): 30 mL via ORAL
  Filled 2012-08-18 (×6): qty 30

## 2012-08-18 MED ORDER — CARVEDILOL 3.125 MG PO TABS
6.2500 mg | ORAL_TABLET | Freq: Two times a day (BID) | ORAL | Status: DC
  Administered 2012-08-18 – 2012-08-24 (×13): 6.25 mg via ORAL
  Filled 2012-08-18 (×13): qty 2

## 2012-08-18 MED ORDER — ONDANSETRON HCL 4 MG/2ML IJ SOLN
4.0000 mg | Freq: Four times a day (QID) | INTRAMUSCULAR | Status: DC | PRN
  Administered 2012-08-23: 4 mg via INTRAVENOUS
  Filled 2012-08-18: qty 2

## 2012-08-18 MED ORDER — ENOXAPARIN SODIUM 40 MG/0.4ML ~~LOC~~ SOLN
40.0000 mg | Freq: Every day | SUBCUTANEOUS | Status: DC
  Administered 2012-08-18 – 2012-08-24 (×7): 40 mg via SUBCUTANEOUS
  Filled 2012-08-18 (×7): qty 0.4

## 2012-08-18 NOTE — ED Notes (Signed)
Patient cleaned of stool.  

## 2012-08-18 NOTE — ED Notes (Signed)
Pt to department via [facility].  Reports SOB this evening.  Was seen in department last night for same.  Pt also had minor abdominal pain and diarrhea tonight.

## 2012-08-18 NOTE — ED Provider Notes (Signed)
History    CSN: [id number] Arrival date & time 08/18/12  0204  First MD Initiated Contact with Patient 08/18/12 0231     Chief Complaint  Patient presents with  . Shortness of Breath  . Diarrhea   (Consider location/radiation/quality/duration/timing/severity/associated sxs/prior Treatment) HPI HPI Comments: Howard [name] is a 79 y.o. male brought in by ambulance, who presents to the Emergency Department complaining of having fallen when he got up to go to the bathroom. He became short of breath. He was seen in the ER last night for being unable to get up on his own. His labs and xray was normal. O2 sats are 97% on RA. Denies fever, chills, nausea, vomiting.  PCP Dr. Renard Matter Past Medical History  Diagnosis Date  . Hypertension   . Hyperlipidemia   . Adenocarcinoma 2003    COLON  . PVD (peripheral vascular disease)     ABI 0.78 RIGHT AND 0.80 LEFT 4/11  . Osteoarthritis     OF KNEES  . Kidney stone   . Schatzki's ring 08/05/10    egd with Dr. Jena Gauss  . Hiatal hernia 08/05/10    egd with Dr. Jena Gauss  . Esophageal dysmotility   . Microscopic colitis   . Internal hemorrhoids   . Diverticula of colon   . CHF (congestive heart failure)   . Coronary artery stenosis     a. 06/2011 Cath: LM 20, LAD min irregs, D1 95 - small,  LCX 60p, RCA 64m, 50d, PLA 50ost;  b. 06/2011 PCI/Rota  RCA  -> 3.0x16mm Promus Element DES  . Angina   . COPD (chronic obstructive pulmonary disease)   . Asthma   . Pneumonia ~ 2005; 09/2005  . Shortness of breath 07/17/11    "laying down"  . Type II diabetes mellitus   . GERD (gastroesophageal reflux disease)   . Anxiety   . Ischemic cardiomyopathy     a. 05/2011 Echo: EF 45-50%, inf/post HK  . H. pylori infection 03/01/12    treated with prevpac   Past Surgical History  Procedure Laterality Date  . Hemicolectomy  2003  . Appendectomy    . Colonoscopy  04/2001    Dr. Karilyn Cota- colon carcinoma  . Esophagogastroduodenoscopy  04/2001    Dr. Karilyn Cota  .  Esophagogastroduodenoscopy  08/05/10    Dr. Rinaldo Ratel ring, hiatal hernia  . Colonoscopy  09/04/09    Dr. Chauncy Passy, hemorrhoids  . Atherectomy  07/17/11  . Coronary angioplasty  07/17/11    rotablator  . Colon surgery    . Cataract extraction, bilateral    . Esophagogastroduodenoscopy  03/01/2012    Procedure: ESOPHAGOGASTRODUODENOSCOPY (EGD);  Surgeon: Corbin Ade, MD;  Location: [facility];  Service: Endoscopy;  Laterality: N/A;  1:15  . Colonoscopy  03/11/2012    Procedure: COLONOSCOPY;  Surgeon: Corbin Ade, MD;  Location: [facility];  Service: Endoscopy;  Laterality: N/A;  10:30   Family History  Problem Relation Age of Onset  . Coronary artery disease Father   . Coronary artery disease Brother     CABG  . Diabetes Brother   . Coronary artery disease Brother   . Heart Problems Mother    History  Substance Use Topics  . Smoking status: Former Smoker -- 20 years    Types: Cigarettes    Quit date: 02/25/2008  . Smokeless tobacco: Never Used  . Alcohol Use: No    Review of Systems  Constitutional: Negative for fever.  10 Systems reviewed and are negative for acute change except as noted in the HPI.  HENT: Negative for congestion.   Eyes: Negative for discharge and redness.  Respiratory: Positive for shortness of breath. Negative for cough.   Cardiovascular: Negative for chest pain.  Gastrointestinal: Negative for vomiting and abdominal pain.  Musculoskeletal: Negative for back pain.  Skin: Negative for rash.  Neurological: Negative for syncope, numbness and headaches.  Psychiatric/Behavioral:       No behavior change.    Allergies  Review of patient's allergies indicates no known allergies.  Home Medications   Current Outpatient Rx  Name  Route  Sig  Dispense  Refill  . albuterol (PROAIR HFA) 108 (90 BASE) MCG/ACT inhaler   Inhalation   Inhale 2 puffs into the lungs every 4 (four) hours as needed. For shortness of breath         .  aspirin EC 81 MG tablet   Oral   Take 81 mg by mouth daily.         . carvedilol (COREG) 6.25 MG tablet   Oral   Take 6.25 mg by mouth 2 (two) times daily.         . cholestyramine (QUESTRAN) 4 GM/DOSE powder   Oral   Take 2 g by mouth daily.          . Cinnamon 500 MG capsule   Oral   Take 1,000 mg by mouth daily.         . clopidogrel (PLAVIX) 75 MG tablet   Oral   Take 1 tablet (75 mg total) by mouth daily.   90 tablet   3   . dicyclomine (BENTYL) 10 MG capsule   Oral   Take 20 mg by mouth 4 (four) times daily as needed (cramps).          . doxazosin (CARDURA) 2 MG tablet   Oral   Take 1 tablet (2 mg total) by mouth at bedtime.   30 tablet   2   . Ferrous Sulfate (IRON) 325 (65 FE) MG TABS   Oral   Take 325 mg by mouth 2 (two) times daily.   60 each   0     Pt. Is to take for only 1 month   . fish oil-omega-3 fatty acids 1000 MG capsule   Oral   Take 1 g by mouth daily.          . furosemide (LASIX) 20 MG tablet   Oral   Take 20 mg by mouth 2 (two) times daily.          Marland Kitchen GLIPIZIDE XL 5 MG 24 hr tablet   Oral   Take 5 mg by mouth daily.          Marland Kitchen loperamide (IMODIUM) 2 MG capsule   Oral   Take 2 mg by mouth as needed. For runny stools         . LORazepam (ATIVAN) 0.5 MG tablet   Oral   Take 0.5 mg by mouth every 4 (four) hours as needed. For anxiety          . metroNIDAZOLE (FLAGYL) 500 MG tablet   Oral   Take 1 tablet (500 mg total) by mouth 3 (three) times daily. Take with food.   42 tablet   0   . Multiple Vitamins-Minerals (MULTIVITAMIN) tablet   Oral   Take 1 tablet by mouth daily.   30 tablet   0   .  EXPIRED: nitroGLYCERIN (NITROSTAT) 0.4 MG SL tablet   Sublingual   Place 1 tablet (0.4 mg total) under the tongue every 5 (five) minutes as needed for chest pain.   25 tablet   3   . ondansetron (ZOFRAN) 4 MG tablet   Oral   Take 4 mg by mouth every 6 (six) hours as needed for nausea. nausea         .  pantoprazole (PROTONIX) 40 MG tablet   Oral   Take 40 mg by mouth daily.         . potassium chloride SA (K-DUR,KLOR-CON) 20 MEQ tablet   Oral   Take 20 mEq by mouth daily.          . simvastatin (ZOCOR) 40 MG tablet   Oral   Take 40 mg by mouth every evening.         . vancomycin (VANCOCIN) 125 MG capsule   Oral   Take 1 capsule (125 mg total) by mouth 4 (four) times daily.   56 capsule   0    BP 145/51  Pulse 116  Temp(Src) 100.4 F (38 C) (Oral)  Resp 24  Ht 5\' 7"  (1.702 m)  Wt 200 lb (90.719 kg)  BMI 31.32 kg/m2  SpO2 98% Physical Exam  Nursing note and vitals reviewed. Constitutional: He appears well-developed and well-nourished.  Awake, alert, nontoxic appearance.  HENT:  Head: Normocephalic and atraumatic.  Multiple Seb Ks on face and neck  Eyes: Right eye exhibits no discharge. Left eye exhibits no discharge.  Neck: Neck supple.  Pulmonary/Chest: Effort normal. He exhibits no tenderness.  Abdominal: Soft. There is no tenderness. There is no rebound.  Musculoskeletal: He exhibits no tenderness.  Baseline ROM, no obvious new focal weakness.  Neurological:  Mental status and motor strength appears baseline for patient and situation.  Skin: No rash noted.  Psychiatric: He has a normal mood and affect.    ED Course  Procedures (including critical care time) Results for orders placed during the hospital encounter of 08/18/12  CBC      Result Value Range   WBC 15.0 (*) 4.0 - 10.5 K/uL   RBC 3.17 (*) 4.22 - 5.81 MIL/uL   Hemoglobin 10.6 (*) 13.0 - 17.0 g/dL   HCT 16.1 (*) 09.6 - 04.5 %   MCV 100.3 (*) 78.0 - 100.0 fL   MCH 33.4  26.0 - 34.0 pg   MCHC 33.3  30.0 - 36.0 g/dL   RDW 40.9  81.1 - 91.4 %   Platelets 145 (*) 150 - 400 K/uL  BASIC METABOLIC PANEL      Result Value Range   Sodium 141  135 - 145 mEq/L   Potassium 3.6  3.5 - 5.1 mEq/L   Chloride 104  96 - 112 mEq/L   CO2 26  19 - 32 mEq/L   Glucose, Bld 185 (*) 70 - 99 mg/dL   BUN 28 (*)  6 - 23 mg/dL   Creatinine, Ser 7.82  0.50 - 1.35 mg/dL   Calcium 8.1 (*) 8.4 - 10.5 mg/dL   GFR calc non Af Amer 57 (*) >90 mL/min   GFR calc Af Amer 66 (*) >90 mL/min   Dg Chest Portable 1 View  08/17/2012   *RADIOLOGY REPORT*  Clinical Data: Shortness of breath, weakness.  PORTABLE CHEST - 1 VIEW  Comparison: 05/09/2012  Findings: Cardiomegaly with vascular congestion.  No confluent opacities, effusions or overt edema.  No acute bony abnormality.  IMPRESSION:  Cardiomegaly, vascular congestion.   Original Report Authenticated By: Charlett Nose, M.D.   No diagnosis found. [id number] Will allow patient to wait in the ER until Dr. Renard Matter comes in. 5:35 AM:  T/C to Dr. Renard Matter, case discussed, including:  HPI, pertinent PM/SHx, VS/PE, dx testing, ED course and treatment.  Agreeable to admission.  Requests to write temporary orders,  Med-surg bed.   MDM  Patient here with fall tonight when he got up to go to the bathroom. Fell against the bed. Had diarrhea. Felt shortness of breath. Labs compared with yesterday with an elevated wbc. NO change in the chest xray. Given IVF, albuterol treatment. His pulse ox remains 98%. Spoke with Dr. Renard Matter who will admit the patient. Pt stable in ED with no significant deterioration in condition.The patient appears reasonably stabilized for admission considering the current resources, flow, and capabilities available in the ED at this time, and I doubt any other [facility] requiring further screening and/or treatment in the ED prior to admission.  Coding  Reviewed nurses notes and vital signs Reviewed xray and labs Consultation with Dr. Megan Mans, PCP  Nicoletta Dress. Colon Branch, MD 08/21/12 [phone]

## 2012-08-18 NOTE — H&P (Signed)
History and Physical  Howard [name]:[id number] DOB: 04/16/1929 DOA: 08/18/2012  Referring physician: Dr. Renard Matter PCP: [name], MD   Chief Complaint: fall at home  HPI:  79 year old man presented to the emergency department after a fall at home. He complained of generalized weakness, mild abdominal pain and diarrhea. Initial evaluation was notable for mild dehydration and leukocytosis in patient was referred for admission for further evaluation of fall and diarrhea. Emergency department physician discussed with primary care physician Dr. Renard Matter who accepted patient for admission. Approximately 9:30 AM Dr. Renard Matter contacted the hospitalist service and requested the hospitalist service admit the patient in his stead.  History obtained from patient, chart review and son at bedside. At the end of May patient is diagnosed as C. difficile colitis and treated with oral vancomycin. He reports resolution of diarrhea at home and had otherwise been doing fairly well. He became generally weak 6/24, fell at home and came to the emergency department secondary to generalized weakness. No focal deficit was noted and the patient was discharged home. On the morning of admission the patient fell approximately 1 AM and was "weak as water". No new focal deficits were noted but as the patient could not stand up he came to the emergency department with his son. He has had a poor appetite and poor oral intake over the last several days. Low-grade temperature noted. No focal motor deficits noted that are new. Son did note some shortness of breath at home. Patient reports this is resolved at this time.  n the emergency department he was noted to be afebrile with low-grade temperature. Vitals otherwise unremarkable. Chemistry panel is suggested dehydration. Mild leukocytosis and thrombocytopenia were seen. Urinalysis was negative. He is admitted for observation and further evaluation.  Chart Review:  History of left  ocular nerve palsy, previously diagnosed as ocular myasthenia gravis, now down at as per review of neurology documentation.   Review of Systems:  Negative for new visual changes, sore throat, rash, new muscle aches, chest pain, dysuria, bleeding, new focal motor deficit.   Positive for low-grade temperature, shortness of breath, nausea without vomiting, mild lower abdominal pain, episode of diarrhea last night or this morning. Questionable slurred speech for 48 hours.  Past Medical History  Diagnosis Date  . Hypertension   . Hyperlipidemia   . Adenocarcinoma 2003    COLON  . PVD (peripheral vascular disease)     ABI 0.78 RIGHT AND 0.80 LEFT 4/11  . Osteoarthritis     OF KNEES  . Kidney stone   . Schatzki's ring 08/05/10    egd with Dr. Jena Gauss  . Hiatal hernia 08/05/10    egd with Dr. Jena Gauss  . Esophageal dysmotility   . Microscopic colitis   . Internal hemorrhoids   . Diverticula of colon   . CHF (congestive heart failure)   . Coronary artery stenosis     a. 06/2011 Cath: LM 20, LAD min irregs, D1 95 - small,  LCX 60p, RCA 74m, 50d, PLA 50ost;  b. 06/2011 PCI/Rota  RCA  -> 3.0x72mm Promus Element DES  . Angina   . COPD (chronic obstructive pulmonary disease)   . Asthma   . Pneumonia ~ 2005; 09/2005  . Shortness of breath 07/17/11    "laying down"  . Type II diabetes mellitus   . GERD (gastroesophageal reflux disease)   . Anxiety   . Ischemic cardiomyopathy     a. 05/2011 Echo: EF 45-50%, inf/post HK  . H. pylori  infection 03/01/12    treated with prevpac    Past Surgical History  Procedure Laterality Date  . Hemicolectomy  2003  . Appendectomy    . Colonoscopy  04/2001    Dr. Karilyn Cota- colon carcinoma  . Esophagogastroduodenoscopy  04/2001    Dr. Karilyn Cota  . Esophagogastroduodenoscopy  08/05/10    Dr. Rinaldo Ratel ring, hiatal hernia  . Colonoscopy  09/04/09    Dr. Chauncy Passy, hemorrhoids  . Atherectomy  07/17/11  . Coronary angioplasty  07/17/11    rotablator  . Colon  surgery    . Cataract extraction, bilateral    . Esophagogastroduodenoscopy  03/01/2012    Procedure: ESOPHAGOGASTRODUODENOSCOPY (EGD);  Surgeon: Corbin Ade, MD;  Location: [facility];  Service: Endoscopy;  Laterality: N/A;  1:15  . Colonoscopy  03/11/2012    Procedure: COLONOSCOPY;  Surgeon: Corbin Ade, MD;  Location: [facility];  Service: Endoscopy;  Laterality: N/A;  10:30    Social History:  reports that he quit smoking about 4 years ago. His smoking use included Cigarettes. He smoked 0.00 packs per day for 20 years. He has never used smokeless tobacco. He reports that he does not drink alcohol or use illicit drugs.  No Known Allergies  Family History  Problem Relation Age of Onset  . Coronary artery disease Father   . Coronary artery disease Brother     CABG  . Diabetes Brother   . Coronary artery disease Brother   . Heart Problems Mother      Prior to Admission medications   Medication Sig Start Date End Date Taking? Authorizing Provider  albuterol (PROAIR HFA) 108 (90 BASE) MCG/ACT inhaler Inhale 2 puffs into the lungs every 4 (four) hours as needed. For shortness of breath    Historical Provider, MD  aspirin EC 81 MG tablet Take 81 mg by mouth daily.    Historical Provider, MD  carvedilol (COREG) 6.25 MG tablet Take 6.25 mg by mouth 2 (two) times daily.    Historical Provider, MD  cholestyramine Lanetta Inch) 4 GM/DOSE powder Take 2 g by mouth daily.  09/16/11 09/15/12  Nira Retort, NP  Cinnamon 500 MG capsule Take 1,000 mg by mouth daily.    Historical Provider, MD  clopidogrel (PLAVIX) 75 MG tablet Take 1 tablet (75 mg total) by mouth daily. 12/16/11   Kathleene Hazel, MD  dicyclomine (BENTYL) 10 MG capsule Take 20 mg by mouth 4 (four) times daily as needed (cramps).  02/20/12   Historical Provider, MD  doxazosin (CARDURA) 2 MG tablet Take 1 tablet (2 mg total) by mouth at bedtime. 07/15/12   Jodelle Gross, NP  Ferrous Sulfate (IRON) 325 (65 FE) MG TABS  Take 325 mg by mouth 2 (two) times daily. 10/22/11   Gaylord Shih, MD  fish oil-omega-3 fatty acids 1000 MG capsule Take 1 g by mouth daily.     Historical Provider, MD  furosemide (LASIX) 20 MG tablet Take 20 mg by mouth 2 (two) times daily.     Historical Provider, MD  GLIPIZIDE XL 5 MG 24 hr tablet Take 5 mg by mouth daily.  01/13/12   Historical Provider, MD  loperamide (IMODIUM) 2 MG capsule Take 2 mg by mouth as needed. For runny stools 05/19/11   Historical Provider, MD  LORazepam (ATIVAN) 0.5 MG tablet Take 0.5 mg by mouth every 4 (four) hours as needed. For anxiety     Historical Provider, MD  metroNIDAZOLE (FLAGYL) 500 MG tablet Take 1  tablet (500 mg total) by mouth 3 (three) times daily. Take with food. 07/22/12   Tiffany Kocher, PA-C  Multiple Vitamins-Minerals (MULTIVITAMIN) tablet Take 1 tablet by mouth daily. 08/27/11 08/26/12  Gaylord Shih, MD  nitroGLYCERIN (NITROSTAT) 0.4 MG SL tablet Place 1 tablet (0.4 mg total) under the tongue every 5 (five) minutes as needed for chest pain. 07/18/11 07/17/12  Ok Anis, NP  ondansetron (ZOFRAN) 4 MG tablet Take 4 mg by mouth every 6 (six) hours as needed for nausea. nausea    Historical Provider, MD  pantoprazole (PROTONIX) 40 MG tablet Take 40 mg by mouth daily.    Historical Provider, MD  potassium chloride SA (K-DUR,KLOR-CON) 20 MEQ tablet Take 20 mEq by mouth daily.     Historical Provider, MD  simvastatin (ZOCOR) 40 MG tablet Take 40 mg by mouth every evening.    Historical Provider, MD  vancomycin (VANCOCIN) 125 MG capsule Take 1 capsule (125 mg total) by mouth 4 (four) times daily. 07/26/12   Nira Retort, NP   Physical Exam: Filed Vitals:   08/18/12 0400 08/18/12 0500 08/18/12 0600 08/18/12 0836  BP: 127/57 121/59 133/61 126/45  Pulse: 107 103 99 93  Temp:    98.4 F (36.9 C)  TempSrc:    Oral  Resp:      Height:    5\' 8"  (1.727 m)  Weight:    85.7 kg (188 lb 15 oz)  SpO2: 95% 94% 96% 97%   General:  appears calm and  comfortable lying in bed.   eyes: Pupils appear round and unremarkable. Left ocular nerve palsy is noted. Ptosis left is noted which patient can overcome with effort.  ENT slightly hard of hearing. Lips and tongue unremarkable. Face appears symmetric. Neck appears unremarkable Cardiovascular regular rate and rhythm. No murmur, rub, gallop. No lower extremity edema. Respiratory clear to auscultation bilaterally, no wheezes, rales, rhonchi. Normal respiratory effort. Abdomen soft, nontender, and mildly distended. No apparent pain with palpation. Skin some bruising noted to bilateral upper extremities. Psychiatric grossly normal mood and affect. Speech fluent and appropriate. Neurologic except for chronic cranial nerve palsy no focal motor deficits are noted. Musculoskeletal grossly normal tone and strength 5/5 upper and lower extremities bilaterally.  Wt Readings from Last 3 Encounters:  08/18/12 85.7 kg (188 lb 15 oz)  08/17/12 90.719 kg (200 lb)  06/15/12 86.183 kg (190 lb)    Labs on Admission:  Basic Metabolic Panel:  Recent Labs Lab 08/17/12 0532 08/18/12 0312  NA 139 141  K 4.1 3.6  CL 102 104  CO2 26 26  GLUCOSE 160* 185*  BUN 22 28*  CREATININE 1.05 1.16  CALCIUM 8.5 8.1*    CBC:  Recent Labs Lab 08/17/12 0532 08/18/12 0312  WBC 10.1 15.0*  NEUTROABS 8.0*  --   HGB 9.8* 10.6*  HCT 29.4* 31.8*  MCV 100.7* 100.3*  PLT 137* 145*    Radiological Exams on Admission: Dg Chest Portable 1 View  08/17/2012   *RADIOLOGY REPORT*  Clinical Data: Shortness of breath, weakness.  PORTABLE CHEST - 1 VIEW  Comparison: 05/09/2012  Findings: Cardiomegaly with vascular congestion.  No confluent opacities, effusions or overt edema.  No acute bony abnormality.  IMPRESSION: Cardiomegaly, vascular congestion.   Original Report Authenticated By: Charlett Nose, M.D.     Principal Problem:   Dehydration Active Problems:   Diarrhea   Type II diabetes mellitus   Fall at home    Generalized weakness   Assessment/Plan  1. Dehydration: Ultimate etiology unclear but clearly related to poor oral intake. IV fluids. Check laboratory studies in the morning. 2. Fall at home, generalized weakness: Likely secondary to dehydration. Usually only uses a cane to ambulate. Physical therapy consultation. No signs or symptoms to suggest stroke. 3. Diarrhea?: Monitor. Recently treated for C. difficile colitis. Placed on contact precautions for now. If has recurrent diarrhea, obtain stool. 4. Shortness of breath, COPD: Appears resolved this point, stable.  5. Lower abdominal pain: Significance unclear. Exam benign. Follow clinically. Urinalysis negative.  6. DIABETES mellitus type 2: Appears stable. Sliding-scale insulin. Continue glipizide. 7. GERD: Hold PPI for now.  Code Status: Full code  Family Communication: Discussed with son at bedside  Disposition Plan/Anticipated LOS: Observation, 1-2 days   Time spent: 50 minutes  Brendia Sacks, MD  [facility] Pager [phone] 08/18/2012, 10:10 AM

## 2012-08-18 NOTE — Progress Notes (Signed)
PT Cancellation Note  Patient Details Name: Howard [name] MRN: [id number] DOB: 12-19-1929   Cancelled Treatment:    Reason Eval/Treat Not Completed: Fatigue/lethargy limiting ability to participate Pt's son states that pt is far too weak to try to work with [place] today.  Will try again in the AM tomorrow.  Myrlene Broker L 08/18/2012, 11:25 AM

## 2012-08-19 DIAGNOSIS — R197 Diarrhea, unspecified: Secondary | ICD-10-CM

## 2012-08-19 DIAGNOSIS — A0472 Enterocolitis due to Clostridium difficile, not specified as recurrent: Secondary | ICD-10-CM

## 2012-08-19 LAB — CBC
MCH: 33.8 pg (ref 26.0–34.0)
MCHC: 33.2 g/dL (ref 30.0–36.0)
Platelets: 180 10*3/uL (ref 150–400)
RBC: 3.31 MIL/uL — ABNORMAL LOW (ref 4.22–5.81)

## 2012-08-19 LAB — IRON AND TIBC: Iron: 12 ug/dL — ABNORMAL LOW (ref 42–135)

## 2012-08-19 LAB — BASIC METABOLIC PANEL
Calcium: 7.7 mg/dL — ABNORMAL LOW (ref 8.4–10.5)
GFR calc Af Amer: 62 mL/min — ABNORMAL LOW (ref >90)
GFR calc non Af Amer: 54 mL/min — ABNORMAL LOW (ref >90)
Glucose, Bld: 177 mg/dL — ABNORMAL HIGH (ref 70–99)
Sodium: 141 mEq/L (ref 135–145)

## 2012-08-19 LAB — VITAMIN B12: Vitamin B-12: 565 pg/mL (ref 211–911)

## 2012-08-19 MED ORDER — FUROSEMIDE 10 MG/ML IJ SOLN
40.0000 mg | Freq: Two times a day (BID) | INTRAMUSCULAR | Status: DC
  Administered 2012-08-19 – 2012-08-20 (×2): 40 mg via INTRAVENOUS
  Filled 2012-08-19 (×2): qty 4

## 2012-08-19 MED ORDER — ALBUTEROL SULFATE (5 MG/ML) 0.5% IN NEBU
2.5000 mg | INHALATION_SOLUTION | RESPIRATORY_TRACT | Status: DC
  Administered 2012-08-19 – 2012-08-20 (×3): 2.5 mg via RESPIRATORY_TRACT
  Filled 2012-08-19 (×3): qty 0.5

## 2012-08-19 MED ORDER — PANTOPRAZOLE SODIUM 40 MG PO TBEC
40.0000 mg | DELAYED_RELEASE_TABLET | Freq: Every day | ORAL | Status: DC
  Administered 2012-08-19 – 2012-08-24 (×6): 40 mg via ORAL
  Filled 2012-08-19 (×6): qty 1

## 2012-08-19 MED ORDER — DICYCLOMINE HCL 10 MG PO CAPS: 10.0000 mg | ORAL_CAPSULE | Freq: Four times a day (QID) | ORAL | Status: DC | PRN

## 2012-08-19 MED ORDER — ALBUTEROL SULFATE (5 MG/ML) 0.5% IN NEBU
INHALATION_SOLUTION | RESPIRATORY_TRACT | Status: AC
  Filled 2012-08-19: qty 0.5

## 2012-08-19 MED ORDER — BISMUTH SUBSALICYLATE 262 MG/15ML PO SUSP
30.0000 mL | ORAL | Status: DC | PRN
  Administered 2012-08-21 – 2012-08-23 (×2): 30 mL via ORAL
  Filled 2012-08-19: qty 236

## 2012-08-19 NOTE — Care Management Note (Addendum)
    Page 1 of 2   08/24/2012     10:12:01 AM   CARE MANAGEMENT NOTE 08/24/2012  Patient:  Howard [name]   Account Number:  [id number]  Date Initiated:  08/19/2012  Documentation initiated by:  Sharrie Rothman  Subjective/Objective Assessment:   Pt admitted from home with dehydration and SOB. Pt lives with his son and has been fairly independent with ADL's. Pt has a walker and BSC for home use. Pt is active with [facility].     Action/Plan:   Pts son would like to take pt home at discharge with [facility] for PT and RN. Son is aware that if pt needs placement due to not being able to handle him that [facility] SW can assist with placement.   Anticipated DC Date:  08/21/2012   Anticipated DC Plan:  HOME W HOME HEALTH SERVICES      DC Planning Services  CM consult      [facility] Choice  HOME HEALTH   Choice offered to / List presented to:  C-4 Adult Children        HH arranged  HH-1 RN  HH-2 PT  HH-6 SOCIAL WORKER      HH agency  [facility]   Status of service:  Completed, signed off Medicare Important Message given?  YES (If response is "NO", the following Medicare IM given date fields will be blank) Date Medicare IM given:  08/20/2012 Date Additional Medicare IM given:  08/24/2012  Discharge Disposition:  HOME W HOME HEALTH SERVICES  Per UR Regulation:    If discussed at Long Length of Stay Meetings, dates discussed:   08/24/2012    Comments:  08/24/12 1010 Howard Queen, RN BSN CM Pt discharged to [facility] today. CSW to arrange discharge to facility.  08/20/12 1420 Howard Queen, RN BSN CM Pt potential discharge over the weekend. Pt HH has been arranged with [facility] for RN, PT, and CSW. [name] is aware and will collect the pts information from the chart. Pt will need to be assessed for possible need for home O2. If pt does qualify, they would like O2 to come from [place]. HH services to start within 48 hours of discharge. Pts son and pts nurse aware of  discharge arrangements.  08/19/12 1340 Howard Queen, RN BSN CM

## 2012-08-19 NOTE — Progress Notes (Signed)
NAME:  Howard [name], Howard [name]                 ACCOUNT NO.:  [id number]  MEDICAL RECORD NO.:  [id number]  LOCATION:  A321                          FACILITY:  [facility]  PHYSICIAN:  Leodan Bolyard G. Renard Matter, MD   DATE OF BIRTH:  1929-05-24  DATE OF PROCEDURE: DATE OF DISCHARGE:                                PROGRESS NOTE   This patient had a proBNP done which was elevated 6539 indicative of probable diastolic CHF.  Plan to obtain cardiology consult as well.  At the moment continue IV Lasix.  Continue to monitor daily weights, Is and Os.     Jazzlyn Huizenga G. Renard Matter, MD     AGM/MEDQ  D:  08/19/2012  T:  08/19/2012  Job:  [id number]

## 2012-08-19 NOTE — Consult Note (Signed)
REVIEWED. AWAITING CDIFF PCR.

## 2012-08-19 NOTE — Progress Notes (Signed)
NAME:  Howard [name], Howard [name]                 ACCOUNT NO.:  [id number]  MEDICAL RECORD NO.:  [id number]  LOCATION:  A321                          FACILITY:  [facility]  PHYSICIAN:  Everlina Gotts G. Renard Matter, MD   DATE OF BIRTH:  March 01, 1929  DATE OF PROCEDURE: DATE OF DISCHARGE:                                PROGRESS NOTE   SUBJECTIVE:  This patient had a fairly comfortable night.  He did have 1 loose stool.  He had been having episodes of mild abdominal pain and diarrhea.  He did have dehydration and leukocytosis.  Apparently, he had a fall at home, was brought to the ED by EMS.  He had been treated with oral vancomycin for C. difficile colitis and generalized weakness.  The patient had been treated in recent past for CHF.  OBJECTIVE:  VITAL SIGNS:  Blood pressure 109/59, respirations 20, pulse 95, temp 98.4. HEENT:  Eyes PERRLA, TM negative.  The patient has ptosis of the left eye. NECK:  Supple.  No JVD or thyroid abnormalities. HEART:  Regular rhythm.  No murmurs. LUNGS:  Clear to P and A. ABDOMEN:  No palpable organs or masses. SKIN:  Warm and dry. NEUROLOGICAL:  No focal motor deficits or sensory abnormalities.  ASSESSMENT:  The patient does have a recent treatment for a Clostridium difficile colitis, has been having episodes of diarrhea at home, did have dehydration and a fall prior to coming to the hospital.  No serious injuries.  Does have type 2 diabetes.  Gastroesophageal reflux disease.  PLAN:  To continue IV fluids.  We will obtain GI consult.     Belkys Henault G. Renard Matter, MD     AGM/MEDQ  D:  08/19/2012  T:  08/19/2012  Job:  [id number]

## 2012-08-19 NOTE — Evaluation (Signed)
Physical Therapy Evaluation Patient Details Name: Howard [name] MRN: [id number] DOB: [birth year] Today's Date: 08/19/2012 Time: 7829-5621 PT Time Calculation (min): 53 min  PT Assessment / Plan / Recommendation History of Present Illness  Pt states that he lives alone now as wife is in a NH...son checks in with him fairly often.   He recently has had a recurrence of diarrhea, dyspnea and resultant weakness.  He is now on 3 L of supplemental O2 and needs a walker for gait (he had used a cane for gait).  Clinical Impression  Pt was seen for evaluation.  He is mildly deconditioned from baseline and now needs a walker for gait.  He would benefit from HHPT at d/c and maximal home supervision from family.    PT Assessment  Patient needs continued PT services    Follow Up Recommendations  Home health PT    Does the patient have the potential to tolerate intense rehabilitation    NoN       Barriers to Discharge        Equipment Recommendations  None recommended by PT    Recommendations for Other Services     Frequency Min 3X/week    Precautions / Restrictions Precautions Precautions: Fall Restrictions Weight Bearing Restrictions: No   Pertinent Vitals/Pain       Mobility  Bed Mobility Bed Mobility: Not assessed Details for Bed Mobility Assistance: pt up in chair Transfers Transfers: Sit to Stand;Stand to Sit Sit to Stand: 4: Min guard;From chair/3-in-1;With upper extremity assist Stand to Sit: 4: Min guard;To chair/3-in-1;With upper extremity assist Ambulation/Gait Ambulation/Gait Assistance: 5: Supervision Assistive device: Rolling walker Gait Pattern: Trunk flexed Gait velocity: WNL General Gait Details: vision is impaired due to macular degeneration and he does tend to bump into things during gait Stairs: No Wheelchair Mobility Wheelchair Mobility: No    Exercises     PT Diagnosis: Difficulty walking;Generalized weakness  PT Problem List: Decreased  strength;Decreased activity tolerance;Decreased mobility;Decreased knowledge of use of DME;Decreased safety awareness;Cardiopulmonary status limiting activity PT Treatment Interventions: Gait training;Therapeutic exercise;Patient/family education     PT Goals(Current goals can be found in the care plan section) Acute Rehab PT Goals Patient Stated Goal: none stated PT Goal Formulation: With patient Time For Goal Achievement: 09/02/12 Potential to Achieve Goals: Good  Visit Information  Last PT Received On: 08/19/12 Assistance Needed: +1 History of Present Illness: Pt states that he lives alone now as wife is in a NH...son checks in with him fairly often.   He recently has had a recurrence of diarrhea, dyspnea and resultant weakness.  He is now on 3 L of supplemental O2 and needs a walker for gait (he had used a cane for gait).       Prior Functioning  Home Living Family/patient expects to be discharged to:: Private residence Living Arrangements: Children Available Help at Discharge: Family;Available PRN/intermittently Type of Home: House Home Access: Ramped entrance Home Layout: One level Home Equipment: Walker - 2 wheels;Cane - single point Prior Function Level of Independence: Independent with assistive device(s) Comments: uses a cane Communication Communication: HOH    Cognition  Cognition Arousal/Alertness: Awake/alert Behavior During Therapy: WFL for tasks assessed/performed Overall Cognitive Status: Within Functional Limits for tasks assessed Memory: Decreased short-term memory (had significant difficulty remembering phone #)    Extremity/Trunk Assessment Upper Extremity Assessment Upper Extremity Assessment: Overall WFL for tasks assessed Lower Extremity Assessment Lower Extremity Assessment: Generalized weakness Cervical / Trunk Assessment Cervical / Trunk Assessment: Kyphotic  Balance Balance Balance Assessed: Yes Static Standing Balance Static Standing -  Balance Support: No upper extremity supported Static Standing - Level of Assistance: 5: Stand by assistance  End of Session PT - End of Session Equipment Utilized During Treatment: Gait belt;Oxygen Activity Tolerance: Patient tolerated treatment well Patient left: in chair;with call bell/phone within reach;with chair alarm set Nurse Communication: Mobility status  GP Functional Assessment Tool Used: clinical judgement Functional Limitation: Mobility: Walking and moving around Mobility: Walking and Moving Around Current Status ([id number]): At least 1 percent but less than 20 percent impaired, limited or restricted Mobility: Walking and Moving Around Goal Status [phone]): 0 percent impaired, limited or restricted   [name] 08/19/2012, 12:22 PM

## 2012-08-19 NOTE — Progress Notes (Signed)
UR Chart Review Completed  

## 2012-08-19 NOTE — Consult Note (Signed)
Referring Provider: [name], MD Primary Care Physician:  [name], MD Primary Gastroenterologist:  Roetta Sessions, MD  Reason for Consultation:  Follow-up C.Diff colitis  HPI: Howard [name] is a 79 y.o. male presented to ER after a fall at home. Actually came to ER on 6/24 and 6/25. C/O weakness, SOB. In ER on the 24th, he had unremarkable labs. CXR showed cardiomegaly with vascular congestion. On the 25th, his creatinine was 1.16, BUN slightly elevated at 28. He has chronic stable anemia dating back at least to January 2013. His MCV is elevated at 101.8. No anemia panel available. His BNP was 6539. He also mild leukocytosis of 15,000. Low-grade temp of 100.4.  He was treated recently for C.Diff, started 07/22/12. Initially with Flagyl but when patient c/o no relief in diarrhea, dizziness, he was switched to vancomycin. Diarrhea had resolved but had some recurrence/intermittent loose stool again last week.  PATIENT IS VERY DIFFICULT HISTORIAN. He reports several days of feeling weak. Fell when he got up from bed to go to the bathroom. Has had some loose stool but "not that much". At baseline he has h/o microscopic colitis and IBS-D. He has had some vague abdominal discomfort. Appetite good. No heartburn, vomiting, melena, brbpr. C/O increased SOB.  Patient seen in 02/2012 with concerns for melena, Hgb 8.9, heme +. Last TCS 02/2012 by Dr. Jena Gauss showed s/p right hemicolectomy, pancolonic diverticulosis, rectal polyp and colonic tubular adenoma. Last EGD in 02/2012 showed gastric erosions (+H.pylori), haital hernia. He was treated with Prevpac.    Prior to Admission medications   Medication Sig Start Date End Date Taking? Authorizing Provider  albuterol (PROAIR HFA) 108 (90 BASE) MCG/ACT inhaler Inhale 2 puffs into the lungs every 4 (four) hours as needed. For shortness of breath   Yes Historical Provider, MD  carvedilol (COREG) 6.25 MG tablet Take 6.25 mg by mouth 2 (two) times daily.   Yes  Historical Provider, MD  cholestyramine Lanetta Inch) 4 GM/DOSE powder Take 2 g by mouth daily.  09/16/11 09/15/12 Yes Nira Retort, NP  cholestyramine light (PREVALITE) 4 G packet Take 2 g by mouth daily.   Yes Historical Provider, MD  Cinnamon 500 MG TABS Take 500 mg by mouth daily.   Yes Historical Provider, MD  clopidogrel (PLAVIX) 75 MG tablet Take 1 tablet (75 mg total) by mouth daily. 12/16/11  Yes Kathleene Hazel, MD  diazepam (VALIUM) 5 MG tablet Take 5 mg by mouth every 6 (six) hours as needed for anxiety.   Yes Historical Provider, MD  dicyclomine (BENTYL) 20 MG tablet Take 20 mg by mouth 4 (four) times daily as needed (Stomach Cramps).   Yes Historical Provider, MD  doxazosin (CARDURA) 2 MG tablet Take 1 tablet (2 mg total) by mouth at bedtime. 07/15/12  Yes Jodelle Gross, NP  Ferrous Sulfate (IRON) 325 (65 FE) MG TABS Take 325 mg by mouth 2 (two) times daily. 10/22/11  Yes Gaylord Shih, MD  furosemide (LASIX) 20 MG tablet Take 20 mg by mouth 2 (two) times daily.    Yes Historical Provider, MD  GLIPIZIDE XL 5 MG 24 hr tablet Take 5 mg by mouth daily.  01/13/12  Yes Historical Provider, MD  loperamide (IMODIUM) 2 MG capsule Take 2 mg by mouth as needed. For runny stools 05/19/11  Yes Historical Provider, MD  LORazepam (ATIVAN) 0.5 MG tablet Take 0.5 mg by mouth every 4 (four) hours as needed. For anxiety    Yes Historical Provider,  MD  meclizine (ANTIVERT) 25 MG tablet Take 25 mg by mouth 4 (four) times daily.   Yes Historical Provider, MD  ondansetron (ZOFRAN) 4 MG tablet Take 4 mg by mouth every 6 (six) hours as needed for nausea. nausea   Yes Historical Provider, MD  pantoprazole (PROTONIX) 40 MG tablet Take 40 mg by mouth daily.   Yes Historical Provider, MD  potassium chloride SA (K-DUR,KLOR-CON) 20 MEQ tablet Take 20 mEq by mouth daily.    Yes Historical Provider, MD  saxagliptin HCl (ONGLYZA) 5 MG TABS tablet Take 5 mg by mouth daily.   Yes Historical Provider, MD  simvastatin  (ZOCOR) 40 MG tablet Take 40 mg by mouth every evening.   Yes Historical Provider, MD  tiotropium (SPIRIVA) 18 MCG inhalation capsule Place 18 mcg into inhaler and inhale daily.   Yes Historical Provider, MD  zolpidem (AMBIEN) 5 MG tablet Take 5 mg by mouth at bedtime as needed for sleep.   Yes Historical Provider, MD  nitroGLYCERIN (NITROSTAT) 0.4 MG SL tablet Place 1 tablet (0.4 mg total) under the tongue every 5 (five) minutes as needed for chest pain. 07/18/11 07/17/12  Ok Anis, NP    Current Facility-Administered Medications  Medication Dose Route Frequency Provider Last Rate Last Dose  . 0.9 %  sodium chloride infusion   Intravenous Continuous Standley Brooking, MD      . acetaminophen (TYLENOL) tablet 650 mg  650 mg Oral Q6H PRN Standley Brooking, MD       Or  . acetaminophen (TYLENOL) suppository 650 mg  650 mg Rectal Q6H PRN Standley Brooking, MD      . albuterol (PROVENTIL HFA;VENTOLIN HFA) 108 (90 BASE) MCG/ACT inhaler 2 puff  2 puff Inhalation Q4H PRN Standley Brooking, MD      . alum & mag hydroxide-simeth (MAALOX/MYLANTA) 200-200-20 MG/5ML suspension 30 mL  30 mL Oral Q6H PRN Standley Brooking, MD   30 mL at 08/18/12 2011  . aspirin EC tablet 81 mg  81 mg Oral Daily Standley Brooking, MD   81 mg at 08/18/12 1100  . bismuth subsalicylate (PEPTO BISMOL) 262 MG/15ML suspension 30 mL  30 mL Oral Q1H PRN [place] Bali, MD      . carvedilol (COREG) tablet 6.25 mg  6.25 mg Oral BID Standley Brooking, MD   6.25 mg at 08/18/12 2154  . clopidogrel (PLAVIX) tablet 75 mg  75 mg Oral Daily Standley Brooking, MD   75 mg at 08/18/12 1100  . doxazosin (CARDURA) tablet 2 mg  2 mg Oral QHS Standley Brooking, MD   2 mg at 08/18/12 2154  . enoxaparin (LOVENOX) injection 40 mg  40 mg Subcutaneous Daily Standley Brooking, MD   40 mg at 08/18/12 1100  . furosemide (LASIX) tablet 20 mg  20 mg Oral BID Standley Brooking, MD   20 mg at 08/19/12 0746  . glipiZIDE (GLUCOTROL XL) 24 hr tablet 5 mg   5 mg Oral QAC breakfast Standley Brooking, MD   5 mg at 08/19/12 0746  . LORazepam (ATIVAN) tablet 0.5 mg  0.5 mg Oral Q4H PRN Standley Brooking, MD      . ondansetron [facility]) tablet 4 mg  4 mg Oral Q6H PRN Standley Brooking, MD       Or  . ondansetron [facility]) injection 4 mg  4 mg Intravenous Q6H PRN Standley Brooking, MD      . pantoprazole (  PROTONIX) EC tablet 40 mg  40 mg Oral QAC breakfast [place] Bali, MD      . potassium chloride SA (K-DUR,KLOR-CON) CR tablet 20 mEq  20 mEq Oral Daily Standley Brooking, MD   20 mEq at 08/18/12 1100  . simvastatin (ZOCOR) tablet 40 mg  40 mg Oral QPM Standley Brooking, MD   40 mg at 08/18/12 1659    Allergies as of 08/18/2012  . (No Known Allergies)    Past Medical History  Diagnosis Date  . Hypertension   . Hyperlipidemia   . Adenocarcinoma 2003    COLON  . PVD (peripheral vascular disease)     ABI 0.78 RIGHT AND 0.80 LEFT 4/11  . Osteoarthritis     OF KNEES  . Kidney stone   . Schatzki's ring 08/05/10    egd with Dr. Jena Gauss  . Hiatal hernia 08/05/10    egd with Dr. Jena Gauss  . Esophageal dysmotility   . Microscopic colitis   . Internal hemorrhoids   . Diverticula of colon   . CHF (congestive heart failure)   . Coronary artery stenosis     a. 06/2011 Cath: LM 20, LAD min irregs, D1 95 - small,  LCX 60p, RCA 83m, 50d, PLA 50ost;  b. 06/2011 PCI/Rota  RCA  -> 3.0x51mm Promus Element DES  . Angina   . COPD (chronic obstructive pulmonary disease)   . Asthma   . Pneumonia ~ 2005; 09/2005  . Shortness of breath 07/17/11    "laying down"  . Type II diabetes mellitus   . GERD (gastroesophageal reflux disease)   . Anxiety   . Ischemic cardiomyopathy     a. 05/2011 Echo: EF 45-50%, inf/post HK  . H. pylori infection 03/01/12    treated with prevpac    Past Surgical History  Procedure Laterality Date  . Hemicolectomy  2003  . Appendectomy    . Colonoscopy  04/2001    Dr. Karilyn Cota- colon carcinoma  . Esophagogastroduodenoscopy  04/2001    Dr.  Karilyn Cota  . Esophagogastroduodenoscopy  08/05/10    Dr. Rinaldo Ratel ring, hiatal hernia  . Colonoscopy  09/04/09    Dr. Chauncy Passy, hemorrhoids  . Atherectomy  07/17/11  . Coronary angioplasty  07/17/11    rotablator  . Colon surgery    . Cataract extraction, bilateral    . Esophagogastroduodenoscopy  03/01/2012    Procedure: ESOPHAGOGASTRODUODENOSCOPY (EGD);  Surgeon: Corbin Ade, MD;  [facility];  Service: Endoscopy;  Laterality: N/A;  1:15  . Colonoscopy  03/11/2012    Procedure: COLONOSCOPY;  Surgeon: Corbin Ade, MD;  Location: [facility];  Service: Endoscopy;  Laterality: N/A;  10:30    Family History  Problem Relation Age of Onset  . Coronary artery disease Father   . Coronary artery disease Brother     CABG  . Diabetes Brother   . Coronary artery disease Brother   . Heart Problems Mother     History   Social History  . Marital Status: Married    Spouse Name: N/A    Number of Children: N/A  . Years of Education: N/A   Occupational History  . Not on file.   Social History Main Topics  . Smoking status: Former Smoker -- 20 years    Types: Cigarettes    Quit date: 02/25/2008  . Smokeless tobacco: Never Used  . Alcohol Use: No  . Drug Use: No  . Sexually Active: Not Currently   Other  Topics Concern  . Not on file   Social History Narrative   Lives in [place]. Mar[place]. Wife in [place].   Son lives with him   Caffeine Use-     ROS:  General: see hpi Eyes: Negative for vision changes.  ENT: Negative for hoarseness, difficulty swallowing , nasal congestion. CV: Negative for chest pain, angina, palpitations,  peripheral edema. +DOE/SOB Respiratory: Negative for   cough, sputum, wheezing. +DOE/SOB GI: See history of present illness. GU:  Negative for dysuria, hematuria, urinary incontinence, urinary frequency, nocturnal urination.  MS: Negative for joint pain, low back pain.  Derm: Negative for rash or itching.  Neuro: Negative for  weakness, abnormal sensation, seizure, frequent headaches, memory loss, confusion. See hpi Psych: Negative for anxiety, depression, suicidal ideation, hallucinations.  Endo: Negative for unusual weight change.  Heme: Negative for bruising or bleeding. Allergy: Negative for rash or hives.       Physical Examination: Vital signs in last 24 hours: Temp:  [97.6 F (36.4 C)-98.4 F (36.9 C)] 98.4 F (36.9 C) (06/26 0453) Pulse Rate:  [61-95] 95 (06/26 0453) Resp:  [20] 20 (06/26 0453) BP: (109-126)/(45-77) 109/59 mmHg (06/26 0453) SpO2:  [91 %-100 %] 91 % (06/26 0453) Weight:  [188 lb 15 oz (85.7 kg)] 188 lb 15 oz (85.7 kg) (06/25 0836) Last BM Date: 08/18/12  General: elderly WM, SOB at rest but able to make complete sentences.  Head: Normocephalic, atraumatic.   Eyes: Conjunctiva pale, no icterus. Mouth: Oropharyngeal mucosa moist and pink , no lesions erythema or exudate. Neck: Supple without thyromegaly, masses, or lymphadenopathy.  Lungs: Clear to auscultation bilaterally.  Heart: Regular rate and rhythm, no murmurs rubs or gallops.  Abdomen: Bowel sounds are normal, nontender, nondistended, no hepatosplenomegaly or masses, no abdominal bruits or    hernia , no rebound or guarding.   Rectal: not performed Extremities: No lower extremity edema, clubbing, deformity.  Neuro: Alert and oriented x 4 , grossly normal neurologically.  Skin: Warm and dry, no rash or jaundice.   Psych: Alert and cooperative, normal mood and affect.        Intake/Output from previous day: 06/25 0701 - 06/26 0700 In: 1161.3 [P.O.:680; I.V.:481.3] Out: 130 [Urine:128; Stool:2] Intake/Output this shift:    Lab Results: CBC  Recent Labs  08/17/12 0532 08/18/12 0312 08/19/12 0502  WBC 10.1 15.0* 14.6*  HGB 9.8* 10.6* 11.2*  HCT 29.4* 31.8* 33.7*  MCV 100.7* 100.3* 101.8*  PLT 137* 145* 180   BMET  Recent Labs  08/17/12 0532 08/18/12 0312 08/19/12 0502  NA 139 141 141  K 4.1 3.6 4.5   CL 102 104 107  CO2 26 26 28   GLUCOSE 160* 185* 177*  BUN 22 28* 48*  CREATININE 1.05 1.16 1.22  CALCIUM 8.5 8.1* 7.7*   BNP 6539.  LFT No results found for this basename: BILITOT, BILIDIR, IBILI, ALKPHOS, AST, ALT, PROT, ALBUMIN,  in the last 72 hours   PT/INR No results found for this basename: LABPROT, INR,  in the last 72 hours    Imaging Studies: Dg Chest Portable 1 View  08/17/2012   *RADIOLOGY REPORT*  Clinical Data: Shortness of breath, weakness.  PORTABLE CHEST - 1 VIEW  Comparison: 05/09/2012  Findings: Cardiomegaly with vascular congestion.  No confluent opacities, effusions or overt edema.  No acute bony abnormality.  IMPRESSION: Cardiomegaly, vascular congestion.   Original Report Authenticated By: Charlett Nose, M.D.  Pierre.Alas week]   Impression: 79 year old gentleman with recent C. difficile colitis  treated with vancomycin, chronic anemia/heme positive stools (H. pylori gastritis) in January 2014 who presents with weakness, shortness of breath. He is a very difficult historian and requires redirection frequently. He denies any significant ongoing diarrhea. At baseline he has a history of microscopic colitis and IBS D. Would agree with checking for recurrent/refractory C. difficile by PCR. I suspect there is a different etiology to contribute to his overall weakness as I don't get a sense of significant dehydration from diarrhea. Agree with cardiology evaluation given above complaints, elevated BNP, etc.  With regards to anemia, he now has a macrocytosis. We'll check anemia panel. His BUN has increased/double in the last 24 hours without signs of progressive anemia or overt GI bleeding. Need to keep a close eye on this.  Plan: 1. Met 7, CBC in a.m. 2. C. difficile PCR. 3. Continue PPI. 4. Anemia panel.   LOS: 1 day   Tana Coast  08/19/2012, 8:12 AM

## 2012-08-20 DIAGNOSIS — I739 Peripheral vascular disease, unspecified: Secondary | ICD-10-CM

## 2012-08-20 DIAGNOSIS — I2510 Atherosclerotic heart disease of native coronary artery without angina pectoris: Secondary | ICD-10-CM

## 2012-08-20 DIAGNOSIS — I5043 Acute on chronic combined systolic (congestive) and diastolic (congestive) heart failure: Secondary | ICD-10-CM

## 2012-08-20 LAB — BASIC METABOLIC PANEL
BUN: 63 mg/dL — ABNORMAL HIGH (ref 6–23)
CO2: 28 mEq/L (ref 19–32)
Calcium: 7.7 mg/dL — ABNORMAL LOW (ref 8.4–10.5)
Creatinine, Ser: 1.33 mg/dL (ref 0.50–1.35)
GFR calc non Af Amer: 48 mL/min — ABNORMAL LOW (ref >90)
Glucose, Bld: 116 mg/dL — ABNORMAL HIGH (ref 70–99)
Sodium: 139 mEq/L (ref 135–145)

## 2012-08-20 LAB — CBC
HCT: 28.8 % — ABNORMAL LOW (ref 39.0–52.0)
Hemoglobin: 9.3 g/dL — ABNORMAL LOW (ref 13.0–17.0)
MCH: 32.5 pg (ref 26.0–34.0)
MCHC: 32.3 g/dL (ref 30.0–36.0)
MCV: 100.7 fL — ABNORMAL HIGH (ref 78.0–100.0)
RBC: 2.86 MIL/uL — ABNORMAL LOW (ref 4.22–5.81)

## 2012-08-20 MED ORDER — ALBUTEROL SULFATE (5 MG/ML) 0.5% IN NEBU
2.5000 mg | INHALATION_SOLUTION | Freq: Three times a day (TID) | RESPIRATORY_TRACT | Status: DC
  Administered 2012-08-20 – 2012-08-24 (×12): 2.5 mg via RESPIRATORY_TRACT
  Filled 2012-08-20 (×13): qty 0.5

## 2012-08-20 MED ORDER — ALBUTEROL SULFATE (5 MG/ML) 0.5% IN NEBU
2.5000 mg | INHALATION_SOLUTION | RESPIRATORY_TRACT | Status: DC | PRN
  Administered 2012-08-20 – 2012-08-23 (×4): 2.5 mg via RESPIRATORY_TRACT
  Filled 2012-08-20 (×2): qty 0.5

## 2012-08-20 MED ORDER — FUROSEMIDE 40 MG PO TABS
40.0000 mg | ORAL_TABLET | Freq: Two times a day (BID) | ORAL | Status: DC
  Administered 2012-08-20 – 2012-08-23 (×7): 40 mg via ORAL
  Filled 2012-08-20 (×7): qty 1

## 2012-08-20 NOTE — Progress Notes (Signed)
Patient is currently active with long-term disease management services with [facility] as a benefit of his [facility] Medicare. Patient will receive a post discharge transition of care call and continued monthly home visits for assessments and for education.  Raiford Noble, MSN-Ed, RN,BSN, [facility], [phone]

## 2012-08-20 NOTE — Progress Notes (Signed)
Patient ID: Howard [name], male   DOB: 1929/10/15, 79 y.o.   MRN: [id number]   CARDIOLOGY CONSULT NOTE  Patient ID: Howard [name] MRN: [id number], DOB/AGE: 02-08-1930   Admit date: 08/18/2012 Date of Consult: 08/20/2012   Primary Physician: [name], MD Primary Cardiologist: Juanito Doom MD  Pt. Profile  79 year old white male admitted with a fall and congestive heart failure.  Problem List  Past Medical History  Diagnosis Date  . Hypertension   . Hyperlipidemia   . Adenocarcinoma 2003    COLON  . PVD (peripheral vascular disease)     ABI 0.78 RIGHT AND 0.80 LEFT 4/11  . Osteoarthritis     OF KNEES  . Kidney stone   . Schatzki's ring 08/05/10    egd with Dr. Jena Gauss  . Hiatal hernia 08/05/10    egd with Dr. Jena Gauss  . Esophageal dysmotility   . Microscopic colitis   . Internal hemorrhoids   . Diverticula of colon   . CHF (congestive heart failure)   . Coronary artery stenosis     a. 06/2011 Cath: LM 20, LAD min irregs, D1 95 - small,  LCX 60p, RCA 11m, 50d, PLA 50ost;  b. 06/2011 PCI/Rota  RCA  -> 3.0x80mm Promus Element DES  . Angina   . COPD (chronic obstructive pulmonary disease)   . Asthma   . Pneumonia ~ 2005; 09/2005  . Shortness of breath 07/17/11    "laying down"  . Type II diabetes mellitus   . GERD (gastroesophageal reflux disease)   . Anxiety   . Ischemic cardiomyopathy     a. 05/2011 Echo: EF 45-50%, inf/post HK  . H. pylori infection 03/01/12    treated with prevpac    Past Surgical History  Procedure Laterality Date  . Hemicolectomy  2003  . Appendectomy    . Colonoscopy  04/2001    Dr. Karilyn Cota- colon carcinoma  . Esophagogastroduodenoscopy  04/2001    Dr. Karilyn Cota  . Esophagogastroduodenoscopy  08/05/10    Dr. Rinaldo Ratel ring, hiatal hernia  . Colonoscopy  09/04/09    Dr. Chauncy Passy, hemorrhoids  . Atherectomy  07/17/11  . Coronary angioplasty  07/17/11    rotablator  . Colon surgery    . Cataract extraction, bilateral    .  Esophagogastroduodenoscopy  03/01/2012    Procedure: ESOPHAGOGASTRODUODENOSCOPY (EGD);  Surgeon: Corbin Ade, MD;  [facility];  Service: Endoscopy;  Laterality: N/A;  1:15  . Colonoscopy  03/11/2012    Procedure: COLONOSCOPY;  Surgeon: Corbin Ade, MD;  [facility];  Service: Endoscopy;  Laterality: N/A;  10:30     Allergies  No Known Allergies  HPI   Howard [name] is a delightful 79 year old white male who I've been taking care of for a number of years. I am asked to consult by Dr. Renard Matter for an elevated BNP and some congestive heart failure.  He has had several falls. He says he gets lightheaded and dizzy when he stands. He has had some orthopnea and some edema. He denies any angina or chest discomfort.  He had a drug-eluting stent placed to the right coronary artery in May of 2013. His symptoms of chest discomfort and shortness of breath resolved with this procedure. His last echocardiogram showed EF of 40-45% with inferior posterior hypokinesia. He has no significant mitral regurgitation.  Becoming very frail and weak. He is a little bit confused on our interview today.  Inpatient Medications  . albuterol  2.5 mg  Nebulization TID  . aspirin EC  81 mg Oral Daily  . carvedilol  6.25 mg Oral BID  . clopidogrel  75 mg Oral Daily  . doxazosin  2 mg Oral QHS  . enoxaparin (LOVENOX) injection  40 mg Subcutaneous Daily  . furosemide  40 mg Oral BID  . glipiZIDE  5 mg Oral QAC breakfast  . pantoprazole  40 mg Oral QAC breakfast  . potassium chloride SA  20 mEq Oral Daily  . simvastatin  40 mg Oral QPM    Family History Family History  Problem Relation Age of Onset  . Coronary artery disease Father   . Coronary artery disease Brother     CABG  . Diabetes Brother   . Coronary artery disease Brother   . Heart Problems Mother      Social History History   Social History  . Marital Status: Married    Spouse Name: N/A    Number of Children: N/A  .  Years of Education: N/A   Occupational History  . Not on file.   Social History Main Topics  . Smoking status: Former Smoker -- 20 years    Types: Cigarettes    Quit date: 02/25/2008  . Smokeless tobacco: Never Used  . Alcohol Use: No  . Drug Use: No  . Sexually Active: Not Currently   Other Topics Concern  . Not on file   Social History Narrative   Lives in [place]. Married. Wife in [place].   Son lives with him   Caffeine Use-     Review of Systems  General:  No chills, fever, night sweats or weight changes.  Cardiovascular:  No chest pain, . Dermatological: No rash, lesions/masses Respiratory: No cough, dyspnea Urologic: No hematuria, dysuria Abdominal:   No nausea, vomiting, diarrhea, bright red blood per rectum, melena, or hematemesis Neurologic:  No visual changes, wkns, changes in mental status. All other systems reviewed and are otherwise negative except as noted above.  Physical Exam  Blood pressure 126/63, pulse 78, temperature 98 F (36.7 C), temperature source Oral, resp. rate 20, height 5\' 8"  (1.727 m), weight 188 lb 15 oz (85.7 kg), SpO2 93.00%.  General: Pleasant, NAD, elderly and frail, Psych: Normal affect. Neuro: Alert and oriented x2 Moves all extremities spontaneously. HEENT: Normal  Neck: Supple without bruits , minimal JVD Lungs:  Resp regular and unlabored, CTA. Heart: RRR no s3, s4, or murmurs. Abdomen: Soft, non-tender, non-distended, BS + x 4.  Extremities: No clubbing, cyanosis , minimal edema DP/PT/Radials 2+ and equal bilaterally.  Labs  No results found for this basename: CKTOTAL, CKMB, TROPONINI,  in the last 72 hours Lab Results  Component Value Date   WBC 9.8 08/20/2012   HGB 9.3* 08/20/2012   HCT 28.8* 08/20/2012   MCV 100.7* 08/20/2012   PLT 174 08/20/2012    Recent Labs Lab 08/20/12 0500  NA 139  K 4.1  CL 104  CO2 28  BUN 63*  CREATININE 1.33  CALCIUM 7.7*  GLUCOSE 116*   No results found for this basename: CHOL,  HDL, LDLCALC, TRIG   No results found for this basename: DDIMER    Radiology/Studies  Dg Chest Portable 1 View  08/17/2012   *RADIOLOGY REPORT*  Clinical Data: Shortness of breath, weakness.  PORTABLE CHEST - 1 VIEW  Comparison: 05/09/2012  Findings: Cardiomegaly with vascular congestion.  No confluent opacities, effusions or overt edema.  No acute bony abnormality.  IMPRESSION: Cardiomegaly, vascular congestion.   Original Report  Authenticated By: Charlett Nose, M.D.    ECG From March, normal sinus rhythm with nonspecific ST segment changes.   ASSESSMENT AND PLAN  #1 acute on chronic combined congestive heart failure. He is diuresed and is currently euvolemic. I changed his Lasix to 40 mg by mouth twice a day. He was on 20 mg by mouth twice a day at home. Concern with dizziness that he may be a little bit prerenal or have orthostatic hypotension. I'm going to discontinue his Cardura since this is a major side effect is contraindicated in heart failure. I do not see any prostate issues.  #2 peripheral vascular disease which is stable   #3 high risk of falling. We'll check orthostatic hypotension. We'll discontinue Cardura  #4 coronary artery disease which is stable. He is over a year out from his drug-eluting stent so would discontinue Plavix. Continue aspirin. His risk of bleeding is substantial.   Signed, Valera Castle, MD 08/20/2012, 10:48 AM

## 2012-08-20 NOTE — Progress Notes (Signed)
Subjective:  Couple of brown loose stools in last 24 hours. Lab cancelled the C.Diff PCR for unclear reasons.   Objective: Vital signs in last 24 hours: Temp:  [98 F (36.7 C)-98.3 F (36.8 C)] 98 F (36.7 C) (06/27 0500) Pulse Rate:  [78-85] 78 (06/27 0500) Resp:  [20] 20 (06/27 0500) BP: (120-134)/(63-79) 126/63 mmHg (06/27 0500) SpO2:  [93 %-95 %] 93 % (06/27 0500) Last BM Date: 08/18/12 General:   Alert,  Well-developed, well-nourished, pleasant and cooperative in NAD Head:  Normocephalic and atraumatic. Eyes:  Sclera clear, no icterus.   Abdomen:  Soft, nontender and nondistended.  Normal bowel sounds, without guarding, and without rebound.   Extremities:  1+ PE in LE without deformity. Neurologic:  Alert and  oriented x4;  grossly normal neurologically. Skin:  Intact without significant lesions or rashes. Psych:  Alert and cooperative. Normal mood and affect.  Intake/Output from previous day: 06/26 0701 - 06/27 0700 In: 1820 [P.O.:920; I.V.:900] Out: 1425 [Urine:1425] Intake/Output this shift:    Lab Results: CBC  Recent Labs  08/18/12 0312 08/19/12 0502 08/20/12 0500  WBC 15.0* 14.6* 9.8  HGB 10.6* 11.2* 9.3*  HCT 31.8* 33.7* 28.8*  MCV 100.3* 101.8* 100.7*  PLT 145* 180 174   BMET  Recent Labs  08/18/12 0312 08/19/12 0502 08/20/12 0500  NA 141 141 139  K 3.6 4.5 4.1  CL 104 107 104  CO2 26 28 28   GLUCOSE 185* 177* 116*  BUN 28* 48* 63*  CREATININE 1.16 1.22 1.33  CALCIUM 8.1* 7.7* 7.7*   Lab Results  Component Value Date   VITAMINB12 565 08/18/2012   Lab Results  Component Value Date   FOLATE >20.0 08/18/2012   Lab Results  Component Value Date   IRON 12* 08/18/2012   TIBC 214* 08/18/2012   FERRITIN 105 08/18/2012     Assessment:  79 year old gentleman with recent C. difficile colitis treated with vancomycin, chronic anemia/heme positive stools (H. pylori gastritis) in January 2014 who presents with weakness, shortness of breath. He is  a very difficult historian and requires redirection frequently. He denies any significant ongoing diarrhea. At baseline he has a history of microscopic colitis and IBS-D. Would agree with checking for recurrent/refractory C. difficile by PCR. I suspect there is a different etiology to contribute to his overall weakness as I don't get a sense of significant dehydration from diarrhea. Agree with cardiology evaluation given above complaints, elevated BNP, etc.   With regards to anemia, his B12/folate are normal. His anemia panel most c/w anemia of chronic disease. His BUN has increased/double in the last 48 hours without signs of progressive anemia or overt GI bleeding. Need to keep a close eye on this.   Plan: 1. C.Diff PCR as planned. 2. Continue PPI. 3. Monitor H/H for further drop, appears to be at baseline. 4. Monitor BUN. 5.    LOS: 2 days   Tana Coast  08/20/2012, 8:21 AM

## 2012-08-20 NOTE — Progress Notes (Signed)
NAME:  Howard [name], Howard [name]                 ACCOUNT NO.:  [id number]  MEDICAL RECORD NO.:  [id number]  LOCATION:  A321                          FACILITY:  [facility]  PHYSICIAN:  Dhilan Brauer G. Renard Matter, MD   DATE OF BIRTH:  1929-11-23  DATE OF PROCEDURE: DATE OF DISCHARGE:                                PROGRESS NOTE   SUBJECTIVE:  This patient had a fairly comfortable night.  He did have small liquid stool and continues to have episodes of mild abdominal pain and diarrhea.  He did have a fall at home and was brought to the emergency room by EMS.  He had been treated for C. difficile colitis at home with vancomycin with generalized weakness.  The patient also has been treated in the past for CHF.  He does have type 2 diabetes and gastroesophageal reflux disease.  He was seen in consultation by Gastroenterology Service, and of note he has had chronic anemia with heme-positive stool, H. pylori gastritis in January.  He does have a history of microscopic colitis and irritable bowel syndrome. Gastroenterology Service agreed that he should be seen by Cardiology because of elevated proBNP, and he does have anemia with macrocytosis, anemia panel has been ordered.  OBJECTIVE:  VITAL SIGNS:  Blood pressure 126/63, respirations 20, pulse 78, temp 98.  The patient's hemoglobin currently is 9.3, hematocrit 28.8. HEENT:  Eyes, PERRLA.  TMs, negative.  Oropharynx, benign. NECK:  Supple.  No JVD or thyroid abnormalities. LUNGS:  Clear to P and A. HEART:  Regular rhythm.  No murmurs. ABDOMEN:  No palpable organs or masses. EXTREMITIES:  Free of edema. SKIN:  Warm and dry.  ASSESSMENT:  The patient does have a recent history of a Clostridium difficile colitis, treated with vancomycin.  He has had heme-positive stool, has Helicobacter pylori gastritis.  He had a fall prior to admission but no significant injuries.  He does have an elevated BNP and possible congestive heart failure.  PLAN: 1. To obtain Cardiology  consult. 2. Continue IV Lasix. 3. Continue glipizide for diabetes.  We will continue to monitor daily     weights. 4. Continue previous meds.     Korie Brabson G. Renard Matter, MD     AGM/MEDQ  D:  08/20/2012  T:  08/20/2012  Job:  [id number]

## 2012-08-20 NOTE — Clinical Social Work Psychosocial (Signed)
Clinical Social Work Department BRIEF PSYCHOSOCIAL ASSESSMENT 08/20/2012  Patient:  Howard [name], Howard [name]     Account Number:  [id number]     Admit date:  08/18/2012  Clinical Social Worker:  Nancie Neas  Date/Time:  08/20/2012 03:45 PM  Referred by:  Care Management  Date Referred:  08/20/2012 Referred for  SNF Placement   Other Referral:   Interview type:  Patient Other interview type:   and son- Howard [name]    PSYCHOSOCIAL DATA Living Status:  FAMILY Admitted from facility:   Level of care:   Primary support name:  Howard [name] Primary support relationship to patient:  CHILD, ADULT Degree of support available:   supportive    CURRENT CONCERNS Current Concerns  Post-Acute Placement   Other Concerns:    SOCIAL WORK ASSESSMENT / PLAN CSW met with pt following PT treatment today. Pt alert and oriented. His son, Howard [name] is at bedside. Pt reports he has been living at home with his son who is in and out during the day. At baseline, pt is fairly independent with personal care. He was ambulating with a cane. His son handles most of the housework and provides transportation as pt no longer drives. Pt's wife was placed at [facility] two years ago. PT evaluated pt yesterday and recommendation was for home health. Today, pt has increased weakness and recommendation changed to SNF. CSW discussed placement process, including insurance authorization. They are well aware of this from pt's wife. Requesting [facility] if possible. Pt and Howard [name] have many Medicaid related questions. CSW recommended that they call DSS to clarify. Howard [name] reports they have spoken to Michaela Corner in the past and he will call her. SNF list provided.   Assessment/plan status:  Psychosocial Support/Ongoing Assessment of Needs Other assessment/ plan:   Information/referral to community resources:   SNF list  DSS    PATIENT'S/FAMILY'S RESPONSE TO PLAN OF CARE: Pt and son are agreeable to [facility] for rehab prior to hopeful return  home. CSW will initiate bed search and insurance authorization and follow up with bed offers. No other needs reported at this time.       Derenda Fennel, [place] [phone]

## 2012-08-20 NOTE — Clinical Social Work Placement (Signed)
Clinical Social Work Department CLINICAL SOCIAL WORK PLACEMENT NOTE 08/20/2012  Patient:  Howard [name], Howard [name]  Account Number:  [id number] Admit date:  08/18/2012  Clinical Social Worker:  Derenda Fennel, LCSW  Date/time:  08/20/2012 03:45 PM  Clinical Social Work is seeking post-discharge placement for this patient at the following level of care:   SKILLED NURSING   (*CSW will update this form in Epic as items are completed)   08/20/2012  Patient/family provided with [facility] Department of Clinical Social Work's list of facilities offering this level of care within the geographic area requested by the patient (or if unable, by the patient's family).  08/20/2012  Patient/family informed of their freedom to choose among providers that offer the needed level of care, that participate in Medicare, Medicaid or managed care program needed by the patient, have an available bed and are willing to accept the patient.  08/20/2012  Patient/family informed of [facility]' ownership interest in [facility], as well as of the fact that they are under no obligation to receive care at this facility.  PASARR submitted to EDS on 08/20/2012 PASARR number received from EDS on 08/20/2012  FL2 transmitted to all facilities in geographic area requested by pt/family on  08/20/2012 FL2 transmitted to all facilities within larger geographic area on   Patient informed that his/her managed care company has contracts with or will negotiate with  certain facilities, including the following:     Patient/family informed of bed offers received:   Patient chooses bed at  Physician recommends and patient chooses bed at    Patient to be transferred to  on   Patient to be transferred to facility by   The following physician request were entered in Epic:   Additional Comments:  Derenda Fennel, LCSW [phone]

## 2012-08-20 NOTE — Progress Notes (Signed)
Physical Therapy Treatment Patient Details Name: Howard [name] MRN: [id number] DOB: 03-03-1929 Today's Date: 08/20/2012 Time: 8469-6295 PT Time Calculation (min): 53 min  PT Assessment / Plan / Recommendation  PT Comments   Pt c/o increased fatigue.  He is having diarrhea continuing which is sapping his energy.  He is able to transfer in and out of bed with SBA but he is somewhat unstable with stance.  He was able to walk 57' with a walker and minimal assistance.  I discussed increased weakness with pt and his son.  I definitely think that pt would benefit from SNF at d/c because he is a high fall risk at this point.  Pt/son are now agreeable.  Follow Up Recommendations  SNF     Does the patient have the potential to tolerate intense rehabilitation    Barriers to Discharge        Equipment Recommendations       Recommendations for Other Services    Frequency     Progress towards PT Goals Progress towards PT goals: Not progressing toward goals - comment (frequent diarrhea causing increased weakness)  Plan Discharge plan needs to be updated    Precautions / Restrictions     Pertinent Vitals/Pain     Mobility  Transfers Transfers: Stand Pivot Transfers Sit to Stand: 4: Min guard;With upper extremity assist;From chair/3-in-1;From bed Stand to Sit: 4: Min guard;With upper extremity assist;To chair/3-in-1;To bed Stand Pivot Transfers: 4: Min guard Ambulation/Gait Ambulation/Gait Assistance: 4: Min guard Ambulation Distance (Feet): 60 Feet Assistive device: Rolling walker Ambulation/Gait Assistance Details: frequent instruction needed to remind pt to increase thoracic extension Gait Pattern: Trunk flexed;Shuffle    Exercises     PT Diagnosis:    PT Problem List:   PT Treatment Interventions:     PT Goals (current goals can now be found in the care plan section)    Visit Information  Last PT Received On: 08/20/12    Subjective Data      Cognition       Balance   End of Session PT - End of Session Equipment Utilized During Treatment: Gait belt;Oxygen Activity Tolerance: Patient limited by fatigue Patient left: in chair;with call bell/phone within reach   GP     Myrlene Broker L 08/20/2012, 2:53 PM

## 2012-08-20 NOTE — Progress Notes (Signed)
REVIEWED.  No INPATIENT GI COVERAGE from 5 pm JUN 27 to 0730 JUN 30.  

## 2012-08-21 LAB — BASIC METABOLIC PANEL
BUN: 45 mg/dL — ABNORMAL HIGH (ref 6–23)
Creatinine, Ser: 1 mg/dL (ref 0.50–1.35)
GFR calc non Af Amer: 68 mL/min — ABNORMAL LOW (ref >90)
Glucose, Bld: 93 mg/dL (ref 70–99)
Potassium: 3.3 mEq/L — ABNORMAL LOW (ref 3.5–5.1)

## 2012-08-21 NOTE — Progress Notes (Signed)
NAME:  Howard [name], Howard [name]                 ACCOUNT NO.:  [id number]  MEDICAL RECORD NO.:  [id number]  LOCATION:  A321                          FACILITY:  [facility]  PHYSICIAN:  Markeita Alicia G. Renard Matter, MD   DATE OF BIRTH:  14-Apr-1929  DATE OF PROCEDURE: DATE OF DISCHARGE:                                PROGRESS NOTE   HISTORY OF PRESENT ILLNESS:  This patient was fairly comfortable this morning.  He has not had much abdominal pain at this point.  He has had minimal diarrhea.  He does have a history of C. difficile colitis, and had been treated at home with vancomycin p.o. and was admitted with generalized weakness.  The patient also had been treated for congestive heart failure, type 2 diabetes.  He was seen by Gastroenterology Service.  He does have a history of microscopic colitis and irritable bowel syndrome.  No further workup is planned at the present by Gastroenterology Service.  The patient also has CHF and has been diuresing, is on p.o. Lasix 40 mg b.i.d.  His Cardura has been discontinued.  He is diuresed and is euvolemic.  He has peripheral vascular disease, which is stable.  Coronary artery disease, which is stable.  OBJECTIVE:  VITAL SIGNS:  Blood pressure 121/58, respirations 18, pulse 84, temp 98.3. HEENT:  Eyes PERRLA.  TMs negative.  Oropharynx benign. NECK:  Supple.  No JVD or thyroid abnormalities. LUNGS:  Clear to P and A. HEART:  Regular rhythm. ABDOMEN:  No palpable organs or masses. EXTREMITIES:  Free of edema. SKIN:  Warm and dry.  LABORATORY DATA:  The patient does have a potassium of 3.3.  ASSESSMENT:  The patient has history of Clostridium difficile colitis, treated with vancomycin.  He has heme-positive stool.  Has anemia of chronic disease.  Has recent fall, no significant injuries.  He does have elevated BNP and congestive heart failure.  PLAN:  To continue p.o. Lasix.  We will add p.o. potassium because of hypokalemia.  Continue glipizide for diabetes.  We will  continue to monitor weight.  Continue previous medications.  The patient plans to go to nursing facility at [facility] in approximately 2 days.     Amorina Doerr G. Renard Matter, MD     AGM/MEDQ  D:  08/21/2012  T:  08/21/2012  Job:  [id number]

## 2012-08-22 LAB — GLUCOSE, CAPILLARY
Glucose-Capillary: 129 mg/dL — ABNORMAL HIGH (ref 70–99)
Glucose-Capillary: 135 mg/dL — ABNORMAL HIGH (ref 70–99)

## 2012-08-22 MED ORDER — POTASSIUM CHLORIDE CRYS ER 20 MEQ PO TBCR
20.0000 meq | EXTENDED_RELEASE_TABLET | Freq: Every day | ORAL | Status: DC
  Administered 2012-08-22 – 2012-08-24 (×3): 20 meq via ORAL
  Filled 2012-08-22 (×3): qty 1

## 2012-08-22 MED ORDER — FERROUS SULFATE 325 (65 FE) MG PO TABS
325.0000 mg | ORAL_TABLET | Freq: Two times a day (BID) | ORAL | Status: DC
  Administered 2012-08-23 – 2012-08-24 (×3): 325 mg via ORAL
  Filled 2012-08-22 (×3): qty 1

## 2012-08-22 NOTE — Progress Notes (Signed)
NAME:  Howard [name], Howard [name]                 ACCOUNT NO.:  [id number]  MEDICAL RECORD NO.:  [id number]  LOCATION:  A321                          FACILITY:  [facility]  PHYSICIAN:  Taneil Lazarus G. Renard Matter, MD   DATE OF BIRTH:  10/13/1929  DATE OF PROCEDURE: DATE OF DISCHARGE:                                PROGRESS NOTE   SUBJECTIVE:  The patient had a fairly comfortable night.  He has had minimal diarrhea.  He does have a history of C. difficile colitis which has improved.  He also is a type 2 diabetic.  He has had a diagnosis of acute congestive heart failure, anemia of chronic disease, coronary artery disease which is stable.  He remains on Lasix and is diuresing.  OBJECTIVE:  VITAL SIGNS:  Blood pressure 130/47, respirations 18, pulse 70, temp 98.6. The patient does have a low serum potassium at 3.3. HEENT:  Eyes, PERRLA.  TM, negative.  Oropharynx, benign. NECK:  Supple.  No JVD or thyroid abnormalities. LUNGS:  Clear to P and A. HEART:  Regular rhythm. ABDOMEN:  No palpable organs or masses. EXTREMITIES:  Free of edema. SKIN:  Warm and dry.  ASSESSMENT: 1. Clostridium difficile colitis, improved.  Previous treatment with     vancomycin. 2. Recent fall with no significant injuries. 3. Elevated BNP.  Acute on chronic congestive heart failure. 4. Hypokalemia.  PLAN:  To continue p.o. Lasix.  We will increase p.o. dosage of potassium.  Continue to monitor diabetes.  Continue glipizide.  The patient plans to go to a nursing facility at [facility].  This will likely happen tomorrow.     Jayke Caul G. Renard Matter, MD     AGM/MEDQ  D:  08/22/2012  T:  08/22/2012  Job:  [id number]

## 2012-08-23 DIAGNOSIS — I428 Other cardiomyopathies: Secondary | ICD-10-CM

## 2012-08-23 DIAGNOSIS — I2510 Atherosclerotic heart disease of native coronary artery without angina pectoris: Secondary | ICD-10-CM

## 2012-08-23 DIAGNOSIS — R197 Diarrhea, unspecified: Secondary | ICD-10-CM

## 2012-08-23 LAB — GLUCOSE, CAPILLARY
Glucose-Capillary: 125 mg/dL — ABNORMAL HIGH (ref 70–99)
Glucose-Capillary: 120 mg/dL — ABNORMAL HIGH (ref 70–99)

## 2012-08-23 LAB — BASIC METABOLIC PANEL
BUN: 20 mg/dL (ref 6–23)
CO2: 35 mEq/L — ABNORMAL HIGH (ref 19–32)
CO2: 34 mEq/L — ABNORMAL HIGH (ref 19–32)
Calcium: 7.7 mg/dL — ABNORMAL LOW (ref 8.4–10.5)
Chloride: 102 mEq/L (ref 96–112)
GFR calc Af Amer: >90 mL/min (ref >90)
Glucose, Bld: 114 mg/dL — ABNORMAL HIGH (ref 70–99)
Potassium: 3.2 mEq/L — ABNORMAL LOW (ref 3.5–5.1)
Potassium: 4 mEq/L (ref 3.5–5.1)
Sodium: 137 mEq/L (ref 135–145)

## 2012-08-23 LAB — PRO B NATRIURETIC PEPTIDE: Pro B Natriuretic peptide (BNP): 5890 pg/mL — ABNORMAL HIGH (ref 0–450)

## 2012-08-23 LAB — CBC WITH DIFFERENTIAL/PLATELET
HCT: 26.8 % — ABNORMAL LOW (ref 39.0–52.0)
Hemoglobin: 8.8 g/dL — ABNORMAL LOW (ref 13.0–17.0)
Lymphs Abs: 1.6 10*3/uL (ref 0.7–4.0)
Monocytes Relative: 12 % (ref 3–12)
Neutro Abs: 5.3 10*3/uL (ref 1.7–7.7)
Neutrophils Relative %: 66 % (ref 43–77)
RBC: 2.68 MIL/uL — ABNORMAL LOW (ref 4.22–5.81)

## 2012-08-23 MED ORDER — POTASSIUM CHLORIDE 10 MEQ/100ML IV SOLN
10.0000 meq | INTRAVENOUS | Status: AC
  Administered 2012-08-23 (×4): 10 meq via INTRAVENOUS
  Filled 2012-08-23: qty 400

## 2012-08-23 MED ORDER — HYDROCODONE-ACETAMINOPHEN 5-325 MG PO TABS
1.0000 | ORAL_TABLET | ORAL | Status: DC | PRN
  Administered 2012-08-23: 1 via ORAL
  Filled 2012-08-23: qty 1

## 2012-08-23 MED ORDER — FUROSEMIDE 10 MG/ML IJ SOLN
40.0000 mg | Freq: Every day | INTRAMUSCULAR | Status: DC
  Administered 2012-08-24: 40 mg via INTRAVENOUS
  Filled 2012-08-23: qty 4

## 2012-08-23 NOTE — Plan of Care (Signed)
Problem: Phase II Progression Outcomes Goal: Discharge plan established Outcome: Progressing Anticipated discharge Tuesday, August 24, 2012 to [facility].

## 2012-08-23 NOTE — Progress Notes (Signed)
NAME:  Howard [name], Howard [name]                 ACCOUNT NO.:  [id number]  MEDICAL RECORD NO.:  [id number]  LOCATION:  A321                          FACILITY:  [facility]  PHYSICIAN:  Emmi Wertheim G. Renard Matter, MD   DATE OF BIRTH:  March 14, 1929  DATE OF PROCEDURE:  08/23/2012 DATE OF DISCHARGE:                                PROGRESS NOTE   SUBJECTIVE:  The patient had a fairly comfortable night again.  He does have a history of C. difficile colitis which has improved.  He is also a type 2 diabetic, which is fairly well controlled.  Has diagnosis of acute on chronic congestive heart failure, anemia of chronic disease, coronary artery disease which is stable.  He remains on Lasix and is diuresing.  OBJECTIVE:  GENERAL:  The patient is alert and oriented. VITAL SIGNS:  Blood pressure 144/57, respirations 18, pulse 69, temp 98. HEENT:  Eyes, PERRLA.  TM, negative.  Oropharynx, benign. NECK:  Supple.  No JVD or thyroid abnormalities. LUNGS:  Clear to P and A. HEART:  Regular rhythm.  No murmurs. ABDOMEN:  No palpable organs or masses.  No organomegaly.  ASSESSMENT: 1. Clostridium difficile colitis, improved.  Previous treatment with     vancomycin. 2. Recent fall with no significant injuries. 3. Elevated proBNP, acute on chronic systolic congestive heart failure     with low ejection fraction. 4. Hypokalemia, repleted. 5. Diabetes mellitus type 2, fair control.  PLAN:  To proceed with Cardiology consult today.  Discharge plans are looking at [facility] as a possible site for placement.     Castiel Lauricella G. Renard Matter, MD     AGM/MEDQ  D:  08/23/2012  T:  08/23/2012  Job:  [id number]

## 2012-08-23 NOTE — Progress Notes (Signed)
Physical Therapy Treatment Patient Details Name: Howard [name] MRN: [id number] DOB: 1929/11/30 Today's Date: 08/23/2012 Time: 8295-6213 PT Time Calculation (min): 42 min  PT Assessment / Plan / Recommendation  PT Comments   Pt reports feeling better...was up in a chair over the weekend but did not do any walking.  At the time of my arrival, O2 was set at 3 L/minute.  A trial on RA was done and pt was able to maintain O2 sat in the mid 90s at rest and with exertion.  He continues to be a fall risk and relies heavily on the walker with gait encurance of 80' causing significant fatigue.  Follow Up Recommendations        Does the patient have the potential to tolerate intense rehabilitation     Barriers to Discharge        Equipment Recommendations       Recommendations for Other Services    Frequency     Progress towards PT Goals Progress towards PT goals: Progressing toward goals  Plan Current plan remains appropriate    Precautions / Restrictions Restrictions Weight Bearing Restrictions: No   Pertinent Vitals/Pain     Mobility  Bed Mobility Bed Mobility: Not assessed Transfers Sit to Stand: 5: Supervision;From chair/3-in-1 Stand to Sit: 5: Supervision;To chair/3-in-1 Ambulation/Gait Ambulation/Gait Assistance: 4: Min guard Ambulation Distance (Feet): 80 Feet Assistive device: Rolling walker Ambulation/Gait Assistance Details: pt continues to maintain trunk in flexion...he stated that he needs to look down frequently due to diminished vision Gait Pattern: Trunk flexed General Gait Details: Pt able to ambulate on RA today with O2 sat=93%, HR 80 Stairs: No Wheelchair Mobility Wheelchair Mobility: No    Exercises General Exercises - Lower Extremity Ankle Circles/Pumps: AROM;10 reps;Both;Seated Long Arc Quad: AROM;Both;10 reps;Seated Hip ABduction/ADduction: Strengthening;Both;10 reps;Seated Hip Flexion/Marching: AROM;Both;15 reps;Seated Other Exercises Other  Exercises: sit to stand , 3 repetitions   PT Diagnosis:    PT Problem List:   PT Treatment Interventions:     PT Goals (current goals can now be found in the care plan section)    Visit Information  Last PT Received On: 08/23/12    Subjective Data      Cognition       Balance     End of Session PT - End of Session Equipment Utilized During Treatment: Gait belt Activity Tolerance: Patient tolerated treatment well Patient left: in chair;with call bell/phone within reach;with chair alarm set Nurse Communication: Mobility status   GP     Howard [name] 08/23/2012, 10:33 AM

## 2012-08-23 NOTE — Progress Notes (Addendum)
Subjective: No CP  Breathing is getting better Objective: Filed Vitals:   08/23/12 0102 08/23/12 0508 08/23/12 0724 08/23/12 0855  BP:  144/57  137/59  Pulse:  69  76  Temp:  98 F (36.7 C)    TempSrc:  Oral    Resp:  18    Height:      Weight:  197 lb 1.5 oz (89.4 kg)    SpO2: 97% 97% 98% 96%   Weight change:   Intake/Output Summary (Last 24 hours) at 08/23/12 1013 Last data filed at 08/23/12 0940  Gross per 24 hour  Intake 4624.17 ml  Output   1400 ml  Net 3224.17 ml    General: Alert, awake, oriented x3, in no acute distress Neck:  JVP is increase Heart: Regular rate and rhythm, without murmurs, rubs, gallops.  Lungs: Clear to auscultation.  No rales or wheezes. Exemities:  1+ edema.   Neuro: Grossly intact, nonfocal.   Lab Results: Results for orders placed during the hospital encounter of 08/18/12 (from the past 24 hour(s))  GLUCOSE, CAPILLARY     Status: Abnormal   Collection Time    08/22/12 11:46 AM      Result Value Range   Glucose-Capillary 129 (*) 70 - 99 mg/dL  GLUCOSE, CAPILLARY     Status: None   Collection Time    08/22/12  4:58 PM      Result Value Range   Glucose-Capillary 96  70 - 99 mg/dL  GLUCOSE, CAPILLARY     Status: Abnormal   Collection Time    08/22/12  8:28 PM      Result Value Range   Glucose-Capillary 135 (*) 70 - 99 mg/dL  BASIC METABOLIC PANEL     Status: Abnormal   Collection Time    08/23/12  5:02 AM      Result Value Range   Sodium 139  135 - 145 mEq/L   Potassium 3.2 (*) 3.5 - 5.1 mEq/L   Chloride 102  96 - 112 mEq/L   CO2 35 (*) 19 - 32 mEq/L   Glucose, Bld 130 (*) 70 - 99 mg/dL   BUN 20  6 - 23 mg/dL   Creatinine, Ser 1.61  0.50 - 1.35 mg/dL   Calcium 7.5 (*) 8.4 - 10.5 mg/dL   GFR calc non Af Amer 81 (*) >90 mL/min   GFR calc Af Amer >90  >90 mL/min  CBC WITH DIFFERENTIAL     Status: Abnormal   Collection Time    08/23/12  5:02 AM      Result Value Range   WBC 8.1  4.0 - 10.5 K/uL   RBC 2.68 (*) 4.22 - 5.81  MIL/uL   Hemoglobin 8.8 (*) 13.0 - 17.0 g/dL   HCT 09.6 (*) 04.5 - 40.9 %   MCV 100.0  78.0 - 100.0 fL   MCH 32.8  26.0 - 34.0 pg   MCHC 32.8  30.0 - 36.0 g/dL   RDW 81.1  91.4 - 78.2 %   Platelets 188  150 - 400 K/uL   Neutrophils Relative % 66  43 - 77 %   Neutro Abs 5.3  1.7 - 7.7 K/uL   Lymphocytes Relative 19  12 - 46 %   Lymphs Abs 1.6  0.7 - 4.0 K/uL   Monocytes Relative 12  3 - 12 %   Monocytes Absolute 1.0  0.1 - 1.0 K/uL   Eosinophils Relative 3  0 - 5 %   Eosinophils  Absolute 0.2  0.0 - 0.7 K/uL   Basophils Relative 0  0 - 1 %   Basophils Absolute 0.0  0.0 - 0.1 K/uL  GLUCOSE, CAPILLARY     Status: Abnormal   Collection Time    08/23/12  7:38 AM      Result Value Range   Glucose-Capillary 111 (*) 70 - 99 mg/dL    Studies/Results: @RISRSLT24 @  Medications:Reviewed   @PROBHOSP @  1.  CHF  Acute on chronic systolic CHF  Last echo several years ago LVEF was 40 to 45%.  Seen by Algis Greenhouse on Friday. Over weekend he has gotten signif IV fluid load. I think he is now a little volume overloaded I would diurese with IV lasix today  Follow BMET and BNP   Replete K   I Question if he is taking all his lasix at home as he has become hypokalemic here in hosp  2  CAD  No symptoms to suggest active angina.    3. Renal  Follow  Replete K  Check later today  LOS: 5 days   Dietrich Pates 08/23/2012, 10:13 AM

## 2012-08-23 NOTE — Progress Notes (Signed)
Pt continues to c/o of arm and abdominal pain after receiving Tylenol PRN.  Dr. Renard Matter paged.

## 2012-08-23 NOTE — Progress Notes (Signed)
Subjective: Nausea at time of visit, no vomiting. RN states he asked for different food choices at lunch. No rectal bleeding. Notes 2 stools this morning "same as always is".   Objective: Vital signs in last 24 hours: Temp:  [98 F (36.7 C)-98.5 F (36.9 C)] 98 F (36.7 C) (06/30 0508) Pulse Rate:  [64-77] 76 (06/30 0855) Resp:  [18] 18 (06/30 0508) BP: (133-144)/(54-61) 137/59 mmHg (06/30 0855) SpO2:  [95 %-98 %] 96 % (06/30 0855) Weight:  [197 lb 1.5 oz (89.4 kg)] 197 lb 1.5 oz (89.4 kg) (06/30 0508) Last BM Date: 08/22/12 General:   Alert and oriented Head:  Normocephalic and atraumatic. Abdomen:  Bowel sounds present, soft, mild TTP LLQ, non-distended. Sitting up in chair, with limited exam.  Extremities:  1+ lower extremity edema    Intake/Output from previous day: 06/29 0701 - 06/30 0700 In: 4504.2 [P.O.:1560; I.V.:2844.2; IV Piggyback:100] Out: 1900 [Urine:1900] Intake/Output this shift: Total I/O In: 480 [P.O.:480] Out: 200 [Urine:200]  Lab Results:  Recent Labs  08/23/12 0502  WBC 8.1  HGB 8.8*  HCT 26.8*  PLT 188   BMET  Recent Labs  08/21/12 0559 08/23/12 0502  NA 140 139  K 3.3* 3.2*  CL 104 102  CO2 29 35*  GLUCOSE 93 130*  BUN 45* 20  CREATININE 1.00 0.82  CALCIUM 7.7* 7.5*   CDIFF PCR Collected 6/29   Assessment: 79 year old gentleman with recent C. difficile colitis treated with vancomycin, chronic anemia/heme positive stools (H. pylori gastritis) in January 2014 who presented with weakness, shortness of breath. Cdiff PCR appears to have been collected yesterday. Has known hx of IBS-D, prior hx of microscopic colitis as well. From a GI standpoint, appears to be stable. Anemia of chronic disease without evidence of overt GI bleeding. It is encouraging his last TCS was in Jan 2014. Will await Cdiff PCR and follow peripherally for now. Appears to be at baseline overall from our standpoint.    Plan: Await Cdiff PCR Follow  peripherally Further recommendations once stool is resulted  Nira Retort, ANP-BC [facility]  2:34 PM   LOS: 5 days    08/23/2012, 12:44 PM

## 2012-08-23 NOTE — Progress Notes (Signed)
Dr. Renard Matter paged again.

## 2012-08-23 NOTE — Progress Notes (Signed)
REVIEWED. AGREE. 

## 2012-08-23 NOTE — Clinical Social Work Note (Signed)
[employer] approved SNF placement for [facility] and [place].  Patient informed and agreeable, understands that discharge may be tomorrow.  Asked to work w family to assist w transport to SNF, patient asking son to transport and sign preadmission paperwork if patient is unable to do so.  CSW left message for Caryl Asp at [facility] to return call.  Santa Genera, LCSW Clinical Social Worker [phone])

## 2012-08-23 NOTE — Progress Notes (Signed)
UR chart review completed.  

## 2012-08-23 NOTE — Clinical Social Work Placement (Signed)
    Clinical Social Work Department CLINICAL SOCIAL WORK PLACEMENT NOTE 08/23/2012  Patient:  TYREE, FLUHARTY  Account Number:  [id number] Admit date:  08/18/2012  Clinical Social Worker:  Sherrlyn Hock  Date/time:  08/20/2012 03:45 PM  Clinical Social Work is seeking post-discharge placement for this patient at the following level of care:   SKILLED NURSING   (*CSW will update this form in Epic as items are completed)   08/20/2012  Patient/family provided with [facility] Department of Clinical Social Work's list of facilities offering this level of care within the geographic area requested by the patient (or if unable, by the patient's family).  08/20/2012  Patient/family informed of their freedom to choose among providers that offer the needed level of care, that participate in Medicare, Medicaid or managed care program needed by the patient, have an available bed and are willing to accept the patient.  08/20/2012  Patient/family informed of [facility]' ownership interest in [facility], as well as of the fact that they are under no obligation to receive care at this facility.  PASARR submitted to EDS on 08/20/2012 PASARR number received from EDS on 08/20/2012  FL2 transmitted to all facilities in geographic area requested by pt/family on  08/20/2012 FL2 transmitted to all facilities within larger geographic area on   Patient informed that his/her managed care company has contracts with or will negotiate with  certain facilities, including the following:     Patient/family informed of bed offers received:  08/23/2012 Patient chooses bed at [facility] Physician recommends and patient chooses bed at    Patient to be transferred to  on   Patient to be transferred to facility by   The following physician request were entered in Epic:   Additional Comments: Cedric from [facility] authorized SNF placement, EMS auth good for 08/23/12 also  provided if needed  Santa Genera, LCSW Clinical Social Worker [phone])

## 2012-08-23 NOTE — Progress Notes (Signed)
NAME:  Howard [name], Howard [name]                 ACCOUNT NO.:  [id number]  MEDICAL RECORD NO.:  [id number]  LOCATION:  A321                          FACILITY:  [facility]  PHYSICIAN:  Brentlee Sciara G. Renard Matter, MD   DATE OF BIRTH:  Oct 21, 1929  DATE OF PROCEDURE:  08/23/2012 DATE OF DISCHARGE:                                PROGRESS NOTE   ADDENDUM:  This patient does have abnormal labs.  He does have today a low serum potassium at 3.2, and a hemoglobin of 8.8.  We will replete serum potassium and repeat BMET.     Romney Compean G. Renard Matter, MD     AGM/MEDQ  D:  08/23/2012  T:  08/23/2012  Job:  [id number]

## 2012-08-23 NOTE — Progress Notes (Signed)
Dr. Renard Matter contacted at his office.  Notified of pt's c/o right shoulder pain and abdominal pain with no relief after Tylenol.  Gave order for patient to receive Vicodin 5-325 mg 1 po every 4 hours as needed for pain.  Orders followed.

## 2012-08-24 LAB — BASIC METABOLIC PANEL
Calcium: 7.8 mg/dL — ABNORMAL LOW (ref 8.4–10.5)
Chloride: 104 mEq/L (ref 96–112)
Creatinine, Ser: 0.85 mg/dL (ref 0.50–1.35)
GFR calc Af Amer: >90 mL/min (ref >90)
Sodium: 139 mEq/L (ref 135–145)

## 2012-08-24 MED ORDER — ASPIRIN EC 81 MG PO TBEC: 81.0000 mg | DELAYED_RELEASE_TABLET | Freq: Every day | ORAL | Status: DC

## 2012-08-24 NOTE — Clinical Social Work Placement (Signed)
Clinical Social Work Department CLINICAL SOCIAL WORK PLACEMENT NOTE 08/24/2012  Patient:  Howard [name], Howard [name]  Account Number:  [id number] Admit date:  08/18/2012  Clinical Social Worker:  Derenda Fennel, LCSW  Date/time:  08/20/2012 03:45 PM  Clinical Social Work is seeking post-discharge placement for this patient at the following level of care:   SKILLED NURSING   (*CSW will update this form in Epic as items are completed)   08/20/2012  Patient/family provided with [facility] Department of Clinical Social Work's list of facilities offering this level of care within the geographic area requested by the patient (or if unable, by the patient's family).  08/20/2012  Patient/family informed of their freedom to choose among providers that offer the needed level of care, that participate in Medicare, Medicaid or managed care program needed by the patient, have an available bed and are willing to accept the patient.  08/20/2012  Patient/family informed of [facility]' ownership interest in [facility], as well as of the fact that they are under no obligation to receive care at this facility.  PASARR submitted to EDS on 08/20/2012 PASARR number received from EDS on 08/20/2012  FL2 transmitted to all facilities in geographic area requested by pt/family on  08/20/2012 FL2 transmitted to all facilities within larger geographic area on   Patient informed that his/her managed care company has contracts with or will negotiate with  certain facilities, including the following:     Patient/family informed of bed offers received:  08/23/2012 Patient chooses bed at [facility] Physician recommends and patient chooses bed at    Patient to be transferred to [facility] on  08/24/2012 Patient to be transferred to facility by family  The following physician request were entered in Epic:   Additional Comments: Cedric from [facility] authorized SNF placement,  EMS auth good for 08/23/12 also provided if needed  Derenda Fennel, LCSW [phone]

## 2012-08-24 NOTE — Discharge Summary (Signed)
NAME:  Howard [name], Howard [name]                 ACCOUNT NO.:  [id number]  MEDICAL RECORD NO.:  [id number]  LOCATION:  A321                          FACILITY:  [facility]  PHYSICIAN:  Verley Pariseau G. Inaara Tye, MD   DATE OF BIRTH:  09-Oct-1929  DATE OF ADMISSION:  08/18/2012 DATE OF DISCHARGE:  LH                              DISCHARGE SUMMARY   ADDENDUM:  The patient will be discharged on the following medications: Aspirin 81 mg daily, ProAir HFA 2 puffs every 4 hours as needed, furosemide 20 mg b.i.d., potassium chloride 20 mEq daily, Zocor 40 mg daily, Ativan 0.5 mg every 4 hours as needed, Imodium 2 mg as needed for diarrhea, ferrous sulfate 325 mg b.i.d., Plavix 75 mg daily, glipizide XL 5 mg daily, Zofran 4 mg every 6 hours as needed for nausea, Coreg 6.25 mg b.i.d., Questran 2 g daily, Cardura 2 mg daily, Bentyl 20 mg q.i.d. as needed for stomach cramps, Protonix 40 mg daily, Onglyza 5 mg daily, Ambien 5 mg at bedtime as needed for sleep, Valium 5 mg every 6 hours as needed anxiety, Spiriva HandiHaler 18 mcg daily by inhalation, cinnamon 500 mg daily, Prevalite 4 g packet 2 g daily, Antivert 25 mg q.i.d.  The patient was stable at the time of his discharge.     Issai Werling G. Renard Matter, MD     AGM/MEDQ  D:  08/24/2012  T:  08/24/2012  Job:  [id number]

## 2012-08-24 NOTE — Progress Notes (Signed)
Report called to [facility] [place] 

## 2012-08-24 NOTE — Discharge Summary (Signed)
NAME:  Howard [name], Howard [name]                 ACCOUNT NO.:  [id number]  MEDICAL RECORD NO.:  [id number]  LOCATION:  A321                          FACILITY:  [facility]  PHYSICIAN:  Ashely Goosby G. Renard Matter, MD   DATE OF BIRTH:  23-Feb-1930  DATE OF ADMISSION:  08/18/2012 DATE OF DISCHARGE:  07/01/2014LH                              DISCHARGE SUMMARY   DIAGNOSES: 1. Fall at home with no major injuries. 2. Clostridium difficile colitis, improved. 3. Dehydration. 4. Diabetes mellitus type 2. 5. Acute on chronic systolic congestive heart failure with ejection     fraction 45-50%. 6. Coronary artery disease, stable. 7. Anemia of chronic disease. 8. Iron deficiency. 9. Essential hypertension. 10.Hypokalemia.  CONDITION:  Stable at the time of his discharge.  This patient presented to the emergency room following a fall at home. He had generalized weakness and had abdominal pain and diarrhea.  He was noted to be mildly dehydrated with leukocytosis at the time of his admission.  Patient had been treated for C. difficile colitis with oral vancomycin and reported resolution of diarrhea at home.  He did have evidence of shortness of breath at the time of his admission, and chemistries indicated he had dehydration with mild leukocytosis and thrombocytopenia.  Urinalysis was negative.  PHYSICAL EXAMINATION:  VITAL SIGNS:  Blood pressure 127/57,  temp 98.4, pulse 93. GENERAL APPEARANCE:  Patient was relatively relaxed at time of admission. HEENT:  Decreased hearing. NECK:  Supple.  No JVD or thyroid abnormalities. HEART:  Regular rhythm.  No murmurs. LUNGS:  Clear to P and A. ABDOMEN:  No palpable organs or masses. SKIN:  Warm and dry. NEUROLOGIC:  Negative with exception of chronic cranial nerve palsy.  LABS ON ADMISSION:  CBC, WBC is 10,000 with hemoglobin 9.8, hematocrit 29.4.  Subsequent CBC, WBC 15,000, hemoglobin 10.6, hematocrit 31.8. Chemistries on admission, sodium 139, potassium 4.1, chloride 102,  CO2 of 26, glucose 160, BUN 22, creatinine 1.05, calcium 8.5.  Subsequent chemistries, sodium 141, potassium 3.6, chloride 104, CO2 of 26, glucose 185, BUN 28, creatinine 1.16, calcium 8.1.  Subsequent chemistries on August 23, 2012, hemoglobin 8.8, hematocrit 26.8, potassium 3.2. Chemistries on August 24, 2012, was sodium 139, potassium 4.2, chloride 104, CO2 34, BUN 17, creatinine 0.85, calcium 7.8.  Chest x-ray on admission, cardiomegaly with vascular congestion.  HOSPITAL COURSE:  The patient on admission was started on intravenous fluids.  He was continued on the following medications, Coreg 6.25 mg b.i.d., Cardura 2 mg at bedtime, ferrous sulfate 325 mg b.i.d., Glucotrol XL 5 mg with breakfast, Protonix 40 mg daily, Lasix 40 mg daily, potassium chloride 20 mEq daily, Zocor 40 mg daily.  He was given nebulizer treatments, Proventil t.i.d., continued on aspirin 81 mg daily.  He was given Zofran p.r.n. for nausea, Ativan 0.5 mg p.r.n. for restlessness. Patient had occasional loose stools during this hospitalization and abdominal pain and was seen in consultation by Gastroenterology Service that had been seeing him  previously for probable C. difficile colitis.  He had previously been on vancomycin, has known irritable bowel syndrome, has anemia of chronic disease, but no bleeding.  GI Service felt that they no longer needed to  treat the colitis.  Because of increased difficulty with his breathing and with diagnosis of acute on chronic congestive heart failure, he was given p.o. Lasix and began to diurese.  He did develop hypokalemia, which had to be repleted.  He was seen by Cardiology and they felt he had acute on chronic systolic congestive heart failure.  Previous echocardiogram showed ejection fraction 40-45%.  He felt he had gotten significant IV fluid overload and volume overload.  He was diuresed with Lasix, did develop hypokalemia, which was repleted and they felt he could  be discharged.  The patient had become more and more difficult to manage at home because of weakness, inability to walk and care for himself. Nursing facilities were recommended and social services and discharge planners did find a place for him at [facility].  He was therefore discharged to this facility.     Aina Rossbach G. Renard Matter, MD     AGM/MEDQ  D:  08/24/2012  T:  08/24/2012  Job:  [id number]

## 2012-08-24 NOTE — Clinical Social Work Note (Signed)
Pt d/c today to [facility]. Pt, facility, and pt's son, Fayrene Fearing notified. Fayrene Fearing requesting that his brother Gala Romney be called for transport. Doug agreeable and will pick up pt's prescriptions at MD office and assist pt with paperwork at facility. D/C summary faxed. [employer] notified of d/c today.   Derenda Fennel, [place] [phone]

## 2012-09-03 ENCOUNTER — Ambulatory Visit: Payer: Medicare Other | Admitting: Internal Medicine

## 2012-09-03 ENCOUNTER — Telehealth: Payer: Self-pay | Admitting: Internal Medicine

## 2012-09-03 NOTE — Telephone Encounter (Signed)
Pt was a no show

## 2012-09-29 ENCOUNTER — Ambulatory Visit: Payer: Medicare Other | Admitting: Internal Medicine

## 2012-10-05 ENCOUNTER — Ambulatory Visit (INDEPENDENT_AMBULATORY_CARE_PROVIDER_SITE_OTHER): Payer: Medicare Other | Admitting: Internal Medicine

## 2012-10-05 ENCOUNTER — Encounter: Payer: Self-pay | Admitting: Internal Medicine

## 2012-10-05 VITALS — BP 125/59 | HR 78 | Temp 98.5°F | Ht 64.0 in | Wt 190.4 lb

## 2012-10-05 DIAGNOSIS — K219 Gastro-esophageal reflux disease without esophagitis: Secondary | ICD-10-CM

## 2012-10-05 DIAGNOSIS — R197 Diarrhea, unspecified: Secondary | ICD-10-CM

## 2012-10-05 NOTE — Progress Notes (Signed)
Primary Care Physician:  [name], MD Primary Gastroenterologist:  Dr. Jena Gauss  Pre-Procedure History & Physical: HPI:  Howard [name] is a 79 y.o. male here for followup. History of C. difficile associated diarrhea back in June of this year. Treated with Flagyl and subsequently vancomycin. Patient here today tells me diarrhea resolved until 3 days ago when he's had recurrent episodes of watery nonbloody diarrhea. Had some nausea but no vomiting. They'll eat and maintain fluid intake. He has not had repeat stool studies as far as I know at this time. 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 Workup for GI bleeding earlier this year revealed H. pylori gastritis and benign colon polyps/diverticulosis.  Past Medical History  Diagnosis Date  . Hypertension   . Hyperlipidemia   . Adenocarcinoma 2003    COLON  . PVD (peripheral vascular disease)     ABI 0.78 RIGHT AND 0.80 LEFT 4/11  . Osteoarthritis     OF KNEES  . Kidney stone   . Schatzki's ring 08/05/10    egd with Dr. Jena Gauss  . Hiatal hernia 08/05/10    egd with Dr. Jena Gauss  . Esophageal dysmotility   . Microscopic colitis   . Internal hemorrhoids   . Diverticula of colon   . CHF (congestive heart failure)   . Coronary artery stenosis     a. 06/2011 Cath: LM 20, LAD min irregs, D1 95 - small,  LCX 60p, RCA 72m, 50d, PLA 50ost;  b. 06/2011 PCI/Rota  RCA  -> 3.0x27mm Promus Element DES  . Angina   . COPD (chronic obstructive pulmonary disease)   . Asthma   . Pneumonia ~ 2005; 09/2005  . Shortness of breath 07/17/11    "laying down"  . Type II diabetes mellitus   . GERD (gastroesophageal reflux disease)   . Anxiety   . Ischemic cardiomyopathy     a. 05/2011 Echo: EF 45-50%, inf/post HK  . H. pylori infection 03/01/12    treated with prevpac  . Tubular adenoma   . Hyperplastic polyp of intestine     Past Surgical History  Procedure Laterality Date  . Hemicolectomy  2003  . Appendectomy    .  Colonoscopy  04/2001    Dr. Karilyn Cota- colon carcinoma  . Esophagogastroduodenoscopy  04/2001    Dr. Karilyn Cota  . Esophagogastroduodenoscopy  08/05/10    Dr. Rinaldo Ratel ring, hiatal hernia  . Colonoscopy  09/04/09    Dr. Chauncy Passy, hemorrhoids  . Atherectomy  07/17/11  . Coronary angioplasty  07/17/11    rotablator  . Colon surgery    . Cataract extraction, bilateral    . Esophagogastroduodenoscopy  03/01/2012    Dr. Jena Gauss- gastric erosions, small hiatal hernia, +hpylori,=treated with prevpac  . Colonoscopy  03/11/2012    Dr. Huey Romans R hemicolectomy, pancolonic diverticulosis, tubular adenoma, hyperplastic polyp    Prior to Admission medications   Medication Sig Start Date End Date Taking? Authorizing Provider  albuterol (PROAIR HFA) 108 (90 BASE) MCG/ACT inhaler Inhale 2 puffs into the lungs every 4 (four) hours as needed. For shortness of breath   Yes Historical Provider, MD  Cinnamon 500 MG TABS Take 500 mg by mouth daily.   Yes Historical Provider, MD  clopidogrel (PLAVIX) 75 MG tablet Take 1 tablet (75 mg total) by mouth daily. 12/16/11  Yes Kathleene Hazel, MD  dicyclomine (BENTYL) 20 MG tablet Take 20 mg by mouth 4 (four) times daily as needed (Stomach Cramps).  Yes Historical Provider, MD  furosemide (LASIX) 20 MG tablet Take 20 mg by mouth 2 (two) times daily.    Yes Historical Provider, MD  GLIPIZIDE XL 5 MG 24 hr tablet Take 5 mg by mouth daily.  01/13/12  Yes Historical Provider, MD  hydrocortisone (ANUSOL-HC) 25 MG suppository Place 25 mg rectally 2 (two) times daily.   Yes Historical Provider, MD  loperamide (IMODIUM) 2 MG capsule Take 2 mg by mouth as needed. For runny stools 05/19/11  Yes Historical Provider, MD  LORazepam (ATIVAN) 0.5 MG tablet Take 0.5 mg by mouth every 4 (four) hours as needed. For anxiety    Yes Historical Provider, MD  meclizine (ANTIVERT) 25 MG tablet Take 25 mg by mouth 4 (four) times daily.   Yes Historical Provider, MD  ondansetron  (ZOFRAN) 4 MG tablet Take 4 mg by mouth every 6 (six) hours as needed for nausea. nausea   Yes Historical Provider, MD  pantoprazole (PROTONIX) 40 MG tablet Take 40 mg by mouth daily.   Yes Historical Provider, MD  potassium chloride SA (K-DUR,KLOR-CON) 20 MEQ tablet Take 20 mEq by mouth daily.    Yes Historical Provider, MD  saxagliptin HCl (ONGLYZA) 5 MG TABS tablet Take 5 mg by mouth daily.   Yes Historical Provider, MD  simvastatin (ZOCOR) 40 MG tablet Take 40 mg by mouth every evening.   Yes Historical Provider, MD  tiotropium (SPIRIVA) 18 MCG inhalation capsule Place 18 mcg into inhaler and inhale daily.   Yes Historical Provider, MD  zolpidem (AMBIEN) 5 MG tablet Take 5 mg by mouth at bedtime as needed for sleep.   Yes Historical Provider, MD  aspirin EC 81 MG tablet Take 1 tablet (81 mg total) by mouth daily. 08/24/12   Angus Edilia Bo, MD  carvedilol (COREG) 6.25 MG tablet Take 6.25 mg by mouth 2 (two) times daily.    Historical Provider, MD  cholestyramine Lanetta Inch) 4 GM/DOSE powder Take 2 g by mouth daily.  09/16/11 09/15/12  Nira Retort, NP  cholestyramine light (PREVALITE) 4 G packet Take 2 g by mouth daily.    Historical Provider, MD  diazepam (VALIUM) 5 MG tablet Take 5 mg by mouth every 6 (six) hours as needed for anxiety.    Historical Provider, MD  doxazosin (CARDURA) 2 MG tablet Take 1 tablet (2 mg total) by mouth at bedtime. 07/15/12   Jodelle Gross, NP  Ferrous Sulfate (IRON) 325 (65 FE) MG TABS Take 325 mg by mouth 2 (two) times daily. 10/22/11   Gaylord Shih, MD  nitroGLYCERIN (NITROSTAT) 0.4 MG SL tablet Place 1 tablet (0.4 mg total) under the tongue every 5 (five) minutes as needed for chest pain. 07/18/11 07/17/12  Ok Anis, NP    Allergies as of 10/05/2012  . (No Known Allergies)    Family History  Problem Relation Age of Onset  . Coronary artery disease Father   . Coronary artery disease Brother     CABG  . Diabetes Brother   . Coronary artery disease  Brother   . Heart Problems Mother     History   Social History  . Marital Status: Married    Spouse Name: N/A    Number of Children: N/A  . Years of Education: N/A   Occupational History  . Not on file.   Social History Main Topics  . Smoking status: Former Smoker -- 20 years    Types: Cigarettes    Quit date: 02/25/2008  .  Smokeless tobacco: Never Used  . Alcohol Use: No  . Drug Use: No  . Sexually Active: Not Currently   Other Topics Concern  . Not on file   Social History Narrative   Lives in [place]. Married. Wife in [place].   Son lives with him   Caffeine Use-    Review of Systems: See HPI, otherwise negative ROS  Physical Exam: BP 125/59  Pulse 78  Temp(Src) 98.5 F (36.9 C) (Oral)  Ht 5\' 4"  (1.626 m)  Wt 190 lb 6.4 oz (86.365 kg)  BMI 32.67 kg/m2 General:   Elderly somewhat feeble gentleman who is alert conversant oriented and otherwise in no acute distress.  Skin: Bruising of both hands. Eyes:  Sclera clear, no icterus.   Conjunctiva pink. Ears:  Normal auditory acuity. Nose:  No deformity, discharge,  or lesions. Mouth:  No deformity or lesions. Neck:  Supple; no masses or thyromegaly. No significant cervical adenopathy. Lungs:  Clear throughout to auscultation.   No wheezes, crackles, or rhonchi. No acute distress. Heart:  Regular rate and rhythm; no murmurs, clicks, rubs,  or gallops. Abdomen: Full. Positive bowel sounds soft nontender without obvious mass or organomegaly Pulses:  Normal pulses noted. Extremities:  Without clubbing or edema.  Impression:   79 year old gentleman recently suffered a bout of Clostridium difficile associated diarrhea back in June of this year now with recurrent diarrhea.  This is recurrent Clostridium difficile infection until proven otherwise. He does have a history of microscopic colitis but that has been in remission for some time. He does take Questran daily.  GERD symptoms well controlled on Protonix.  He may need  to come off of PPI therapy if his C. difficile comes back positive once again.   Recommendations:  Maintain adequate oral fluid intake  We'll send a stool sample for Clostridium difficile toxin assay as soon as possible.  You may continue taking Questran 2 g orally daily-be sure not to take his medication within 2 hours of any of his other administered medicines.  Use Imodium AD as needed for symptomatic control of diarrhea.

## 2012-10-05 NOTE — Patient Instructions (Addendum)
Maintain adequate oral fluid intake  We'll send a stool sample for Clostridium difficile toxin assay as soon as possible.  You may continue taking Questran 2 g orally daily-be sure not to take his medication within 2 hours of any of his other administered medicines.  Use Imodium AD as needed for symptomatic control of diarrhea.

## 2012-10-11 ENCOUNTER — Ambulatory Visit: Payer: Medicare Other | Admitting: Cardiology

## 2012-10-18 ENCOUNTER — Ambulatory Visit: Payer: Medicare Other | Admitting: Internal Medicine

## 2012-11-01 ENCOUNTER — Telehealth: Payer: Self-pay | Admitting: *Deleted

## 2012-11-01 NOTE — Telephone Encounter (Signed)
Spoke with pts sonFayrene Fearing- for the past 2 weeks the patient has had no appetite and even the smell of food makes him nauseous. He said he is not really vomiting, he puts the food in his mouth and then he just spits it out. Pt having a lot of heartburn as well. After not eating much for 2 weeks he is weak. Still has diarrhea 3-4x a night. Asked pts son if [facility] did the stool sample for cdiff and he stated they did and they told him it was negative.   pts son is worried about pt and wants to know if you can check his gallbladder. He doesn't think he has had it removed.

## 2012-11-01 NOTE — Telephone Encounter (Signed)
He needs an office visit with his PCP or with [place] (extender) within the next 24 hours

## 2012-11-01 NOTE — Telephone Encounter (Signed)
Spoke with Judeth Cornfield at [facility] and they can not bring patient tomorrow. They are completely booked and she said that the only way to get patient here tomorrow would cost the family a $1000 ride via rescue squad. I spoke with JL and told her the situation and then offered to bring patient in on Wednesday at 3 in [facility] urgent spot. I told Judeth Cornfield to have patient here at 230pm and she agreed.

## 2012-11-01 NOTE — Telephone Encounter (Signed)
pts son is aware. Darl Pikes, to call [facility] to see if they can bring him tomorrow.

## 2012-11-01 NOTE — Telephone Encounter (Signed)
Howard [name] called for pt, states pt's food is not wanting to stay down, pt has been throwing up, pt has also been having bad heart burn, pt has been going to bathroom about 3 or 4 times at night with stools. Please advise [phone]

## 2012-11-02 ENCOUNTER — Ambulatory Visit: Payer: Medicare Other | Admitting: Gastroenterology

## 2012-11-03 ENCOUNTER — Telehealth: Payer: Self-pay | Admitting: Internal Medicine

## 2012-11-03 ENCOUNTER — Encounter: Payer: Self-pay | Admitting: Gastroenterology

## 2012-11-03 ENCOUNTER — Ambulatory Visit (INDEPENDENT_AMBULATORY_CARE_PROVIDER_SITE_OTHER): Payer: Medicare Other | Admitting: Gastroenterology

## 2012-11-03 VITALS — BP 120/70 | HR 76 | Temp 97.6°F | Ht 68.0 in | Wt 173.6 lb

## 2012-11-03 DIAGNOSIS — E860 Dehydration: Secondary | ICD-10-CM

## 2012-11-03 DIAGNOSIS — D649 Anemia, unspecified: Secondary | ICD-10-CM

## 2012-11-03 DIAGNOSIS — R197 Diarrhea, unspecified: Secondary | ICD-10-CM

## 2012-11-03 MED ORDER — VANCOMYCIN HCL 125 MG PO CAPS: 125.0000 mg | ORAL_CAPSULE | Freq: Four times a day (QID) | ORAL | Status: DC

## 2012-11-03 MED ORDER — ONDANSETRON HCL 4 MG PO TABS: 4.0000 mg | ORAL_TABLET | Freq: Four times a day (QID) | ORAL | Status: DC | PRN

## 2012-11-03 NOTE — Assessment & Plan Note (Signed)
Recurrent diarrhea associated with vomiting, poor oral intake. Discussed at length with Dr. Jena Gauss. Patient does have some orthostasis but really has not had adequate outpatient trial of management. Discussed with patient, will try outpatient treatment for the next 24-48 hours but any time if he is unable to maintain hydration or does not respond to therapy, he will need to be hospitalized.  He has a history of microscopic colitis, IBS-D, but with history of C. difficile this would be our first concern. Not mentioned previously he does have history of adenocarcinoma of the colon, but colonoscopy is up to date.  Stat labs including CBC, CMET to be obtained. C.Diff PCR. Start oral vancomycin 125mg  QID for 14 days. OV in 2 weeks. We can decide on taper based on his response. Trial of Zofran.  We will touch base with patient once labs are available to see how he is doing.

## 2012-11-03 NOTE — Telephone Encounter (Signed)
[facility] called as soon as patient left. He is no longer with they and they received 2 prescriptions from extender. She said that we needed to forward the prescriptions to where ever he gets his meds filled now.

## 2012-11-03 NOTE — Assessment & Plan Note (Signed)
Stable chronic anemia. Likely anemia of chronic disease based on prior anemia profile. EGD/TCS up to date.  F/U on it today.

## 2012-11-03 NOTE — Progress Notes (Signed)
Primary Care Physician: [name], MD  Primary Gastroenterologist:  Roetta Sessions, MD   Chief Complaint  Patient presents with  . Abdominal Pain  . Gastrophageal Reflux  . Emesis  . Nausea    HPI: Howard [name] is a 79 y.o. male here for urgent work in. Received phone call from the son stating that the patient was having difficulties with vomiting and diarrhea at the nursing home.  Patient states he's been having problems for about 2 weeks. He was seen in the office on 10/05/2012 for complaints of diarrhea. History of C. difficile associated diarrhea back in June 2014. Initially treated with Flagyl but due to side effects was switched to vancomycin and completed a 14 day course. He had complete resolution of his diarrhea until 3 days prior to his last office visit. We tried to obtain a C. difficile PCR at the nursing home but no indications that this was ever done. 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 Previously this year had a GI bleed and was found to have H. pylori gastritis, treated.  Unable to eat solid foods for 2 weeks. Smells/looking at food, starts vomiting. According to staff, bilious emesis. Patient complains of severe heartburn. All symptoms worse at night. Mid-abdominal pain. BM 5-10 per day and states he doesn't count. No hematemsis, melena, brbpr. Stools watery, never formed. No fever. More TUMS in the last two weeks then in the last 12 years. Vomiting liquids. Cannot keep solid foods down. States he had an order for IV fluids but the nurses could not get an IV started and nothing else was done. He denies fever or chills. Complains of feeling weak. Has been over urinate today, states his urine is light yellow.    Current Outpatient Prescriptions  Medication Sig Dispense Refill  . albuterol (PROAIR HFA) 108 (90 BASE) MCG/ACT inhaler Inhale 2 puffs into the lungs every 4 (four) hours as needed. For shortness of breath      .  carvedilol (COREG) 6.25 MG tablet Take 3.125 mg by mouth 2 (two) times daily.       . cholestyramine light (PREVALITE) 4 G packet Take 2 g by mouth daily.      . Cinnamon 500 MG TABS Take 500 mg by mouth daily.      . clopidogrel (PLAVIX) 75 MG tablet Take 1 tablet (75 mg total) by mouth daily.  90 tablet  3  . dicyclomine (BENTYL) 20 MG tablet Take 20 mg by mouth 4 (four) times daily as needed (Stomach Cramps).      . furosemide (LASIX) 20 MG tablet Take 20 mg by mouth 2 (two) times daily.       Marland Kitchen GLIPIZIDE XL 5 MG 24 hr tablet Take 5 mg by mouth daily.       . hydrocortisone (ANUSOL-HC) 25 MG suppository Place 25 mg rectally 2 (two) times daily.      Marland Kitchen loperamide (IMODIUM) 2 MG capsule Take 2 mg by mouth as needed. For runny stools      . LORazepam (ATIVAN) 0.5 MG tablet Take 0.5 mg by mouth every 4 (four) hours as needed. For anxiety       . meclizine (ANTIVERT) 25 MG tablet Take 12.5 mg by mouth 3 (three) times daily as needed.       . ondansetron (ZOFRAN) 4 MG tablet Take 4 mg by mouth every 6 (six) hours as needed for nausea. nausea      .  pantoprazole (PROTONIX) 40 MG tablet Take 40 mg by mouth daily.      . potassium chloride SA (K-DUR,KLOR-CON) 20 MEQ tablet Take 20 mEq by mouth daily.       . saxagliptin HCl (ONGLYZA) 5 MG TABS tablet Take 5 mg by mouth daily.      . simvastatin (ZOCOR) 40 MG tablet Take 40 mg by mouth every evening.      . tiotropium (SPIRIVA) 18 MCG inhalation capsule Place 18 mcg into inhaler and inhale daily.      Marland Kitchen zolpidem (AMBIEN) 5 MG tablet Take 5 mg by mouth at bedtime as needed for sleep.      . nitroGLYCERIN (NITROSTAT) 0.4 MG SL tablet Place 1 tablet (0.4 mg total) under the tongue every 5 (five) minutes as needed for chest pain.  25 tablet  3   No current facility-administered medications for this visit.    Allergies as of 11/03/2012  . (No Known Allergies)   Past Surgical History  Procedure Laterality Date  . Hemicolectomy  2003  . Appendectomy     . Colonoscopy  04/2001    Dr. Karilyn Cota- colon carcinoma  . Esophagogastroduodenoscopy  04/2001    Dr. Karilyn Cota  . Esophagogastroduodenoscopy  08/05/10    Dr. Rinaldo Ratel ring, hiatal hernia  . Colonoscopy  09/04/09    Dr. Chauncy Passy, hemorrhoids  . Atherectomy  07/17/11  . Coronary angioplasty  07/17/11    rotablator  . Colon surgery    . Cataract extraction, bilateral    . Esophagogastroduodenoscopy  03/01/2012    Dr. Jena Gauss- gastric erosions, small hiatal hernia, +hpylori,=treated with prevpac  . Colonoscopy  03/11/2012    Dr. Rourk-s/p R hemicolectomy, pancolonic diverticulosis, tubular adenoma, hyperplastic polyp    ROS:  General: Weight down 8 pounds since last OV. Poor appetite. Feels weak. ENT: Negative for hoarseness, difficulty swallowing , nasal congestion. CV: Negative for chest pain, angina, palpitations, dyspnea on exertion, peripheral edema.  Respiratory: Negative for dyspnea at rest, dyspnea on exertion, cough, sputum, wheezing.  GI: See history of present illness. GU:  Negative for dysuria, hematuria, urinary incontinence, urinary frequency, nocturnal urination.  Endo: see hpi   Physical Examination:   BP 112/63  Pulse 75  Temp(Src) 97.6 F (36.4 C) (Oral)  Ht 5\' 8"  (1.727 m)  Wt 173 lb 9.6 oz (78.744 kg)  BMI 26.4 kg/m2  General: Well-nourished, well-developed in no acute distress. Accompanied by staff. Eyes: No icterus. Mouth: Oropharyngeal mucosa moist and pink , no lesions erythema or exudate. Lungs: Clear to auscultation bilaterally.  Heart: Regular rate and rhythm, no murmurs rubs or gallops.  Abdomen: Bowel sounds are normal, nontender, nondistended, no hepatosplenomegaly or masses, no abdominal bruits or hernia , no rebound or guarding.   Extremities: No lower extremity edema. No clubbing or deformities. Neuro: Alert and oriented x 4   Skin: Warm and dry, no jaundice.   Psych: Alert and cooperative, normal mood and affect.  Labs:  Last labs we  have on hand is from 09/01/2012. Hemoglobin 8.7, hematocrit 27.6, MCV 101, platelets 281,000, BUN 17, creatinine 1.06, calcium 8.2, total bilirubin 0.2, alkaline phosphatase 51, AST 20, ALT 18, albumin 2.9, hemoglobin A1c 6.2. Imaging Studies: No results found.

## 2012-11-03 NOTE — Telephone Encounter (Signed)
[facility] not [facility] so I called [facility] and talked with Soledad Gerlach and she said they would order it through there pharmacy.

## 2012-11-03 NOTE — Patient Instructions (Addendum)
1. Stat labs. 2. Stat stool test. 3. Zofran 4 mg every 6 hours as needed for nausea and vomiting. 4. Vancomycin 125 mg 4 times a day for 14 days. 5. Office visit in 2 weeks. 6. Low fiber, no dairy diet for 10 days.

## 2012-11-03 NOTE — Telephone Encounter (Signed)
I called [facility] and talked with Stanton Kidney the nurse for him. She said to [facility] [place]. I called in the two Rx that [facility] has ordered.

## 2012-11-04 ENCOUNTER — Telehealth: Payer: Self-pay | Admitting: Gastroenterology

## 2012-11-04 DIAGNOSIS — R197 Diarrhea, unspecified: Secondary | ICD-10-CM

## 2012-11-04 LAB — CBC
HGB: 11.5 g/dL
MCV: 98.3 fL
WBC: 6.3

## 2012-11-04 LAB — COMPREHENSIVE METABOLIC PANEL
ALT: 10 U/L (ref 10–40)
AST: 19 U/L
Alkaline Phosphatase: 83 U/L
BUN: 41 mg/dL — AB (ref 4–21)
Creatinine: 1.7 mg/dL — AB (ref 0.6–1.3)

## 2012-11-04 NOTE — Telephone Encounter (Signed)
Spoke with Tacey Ruiz at [facility]- gave her orders for potassium. Pt is taking zofran but they had to order vanc through their pharmacy and it is supposed to come in tonight. She said she hasnt heard any complaints from him today.  1st shift nurse called earlier today- they were unable to collect cdiff. They had collected one on 10/08/12 and 10/21/12 and both cdiffs were negative then. They will collect cdiff as soon as they can.

## 2012-11-04 NOTE — Progress Notes (Signed)
CC'd to PCP 

## 2012-11-04 NOTE — Telephone Encounter (Signed)
Received STAT labs finally.  His H/H are better. 11.5/34.8. WBC 6300. Potassium 3.3L BUN 41H Creatinine 1.67H LFTs normal except albumin 2.8L  Let's make sure he is taking Zofran and Vancomycin as ordered yesterday. Has his oral intake improved? How many stools today?  He should increase Potassium to 20 meq BID for 3 days, then go back to once daily.

## 2012-11-05 NOTE — Telephone Encounter (Signed)
Let's call and check on him Monday. Need to repeat met 7 on Monday. I'll not be here so please f/u on this for me.

## 2012-11-08 ENCOUNTER — Other Ambulatory Visit: Payer: Self-pay

## 2012-11-08 DIAGNOSIS — R197 Diarrhea, unspecified: Secondary | ICD-10-CM

## 2012-11-08 LAB — BASIC METABOLIC PANEL
Albumin: 2.9
BUN: 17 mg/dL (ref 4–21)
Total Bilirubin: 0.2 mg/dL

## 2012-11-08 LAB — CBC: platelet count: 281

## 2012-11-08 NOTE — Telephone Encounter (Signed)
Called [facility], spoke with nurse Gerlene Burdock- he stated pt was doing better, still having some loose stools but not as many. He is able to eat bland foods such as bananas etc. They are aware he needs met 7 today. Order sent to [facility].

## 2012-11-09 NOTE — Telephone Encounter (Signed)
Noted  

## 2012-11-10 NOTE — Telephone Encounter (Signed)
Please request Met-7 results.

## 2012-11-10 NOTE — Telephone Encounter (Signed)
Nurse from [facility] called earlier today to get fax number so she could send the results. We have not received them yet.

## 2012-11-11 NOTE — Telephone Encounter (Signed)
Labs from 11/08/2012. Glucose 1 await, BUN 23, creatinine 1.35, potassium 4.5. C. difficile toxins were done instead of the PCR as I ordered. This was negative on September 12.  I recommended he completed vancomycin. Office visit next week as scheduled.

## 2012-11-12 NOTE — Telephone Encounter (Signed)
pts son is aware

## 2012-11-16 ENCOUNTER — Encounter: Payer: Self-pay | Admitting: Gastroenterology

## 2012-11-16 ENCOUNTER — Ambulatory Visit (INDEPENDENT_AMBULATORY_CARE_PROVIDER_SITE_OTHER): Payer: Medicare Other | Admitting: Gastroenterology

## 2012-11-16 VITALS — BP 114/54 | HR 76 | Temp 97.6°F | Ht 66.0 in | Wt 181.4 lb

## 2012-11-16 DIAGNOSIS — R1314 Dysphagia, pharyngoesophageal phase: Secondary | ICD-10-CM

## 2012-11-16 DIAGNOSIS — R1310 Dysphagia, unspecified: Secondary | ICD-10-CM

## 2012-11-16 DIAGNOSIS — R197 Diarrhea, unspecified: Secondary | ICD-10-CM

## 2012-11-16 DIAGNOSIS — R1319 Other dysphagia: Secondary | ICD-10-CM

## 2012-11-16 DIAGNOSIS — R1011 Right upper quadrant pain: Secondary | ICD-10-CM

## 2012-11-16 DIAGNOSIS — R112 Nausea with vomiting, unspecified: Secondary | ICD-10-CM

## 2012-11-16 DIAGNOSIS — K219 Gastro-esophageal reflux disease without esophagitis: Secondary | ICD-10-CM

## 2012-11-16 MED ORDER — LANSOPRAZOLE 30 MG PO CPDR: 30.0000 mg | DELAYED_RELEASE_CAPSULE | Freq: Every day | ORAL | Status: DC

## 2012-11-16 NOTE — Patient Instructions (Addendum)
1. Stop pantoprazole. 2. Start lansoprazole 30 mg 30 minutes before breakfast daily for heartburn. 3. Barium pill esophagram to evaluate swallowing problems. 4. Abdominal ultrasound to evaluate right upper abdominal pain and vomiting.

## 2012-11-16 NOTE — Progress Notes (Signed)
Primary Care Physician: [name], MD  Primary Gastroenterologist:  Roetta Sessions, MD   Chief Complaint  Patient presents with  . Follow-up    HPI: Howard [name] is a 79 y.o. male here for two-week followup. Last seen on 11/03/2012 with reports of recurrent diarrhea. History of C. difficile in June 2014, distant history of microscopic colitis in long-term remission.  Repeat C. difficile toxin titer was negative. We actually ordered a PCR but the nursing home did a toxin test instead. He was empirically started on vancomycin prior to testing, took several days for the nursing home to get the medication. He will complete a two-week course.  Couple of BMs per day. Occasional solid stool. Lots of heartburn, taking rolaids on top of pantoprazole. Having trouble getting medications. Runs out of medications and will be off few days. No melena, brbpr. Still with vomiting. Ate couple of hotdogs. Drink water/ice. Chokes on bread. No abd pain. States the smell of food from the nursing home food tray makes him vomit. Tolerates food brought from outside. C/O dysphagia to food and pills.   Current Outpatient Prescriptions  Medication Sig Dispense Refill  . albuterol (PROAIR HFA) 108 (90 BASE) MCG/ACT inhaler Inhale 2 puffs into the lungs every 4 (four) hours as needed. For shortness of breath      . carvedilol (COREG) 6.25 MG tablet Take 3.125 mg by mouth 2 (two) times daily.       . cholestyramine light (PREVALITE) 4 G packet Take 2 g by mouth daily.      . Cinnamon 500 MG TABS Take 500 mg by mouth daily.      . clopidogrel (PLAVIX) 75 MG tablet Take 1 tablet (75 mg total) by mouth daily.  90 tablet  3  . dicyclomine (BENTYL) 20 MG tablet Take 20 mg by mouth 4 (four) times daily as needed (Stomach Cramps).      . furosemide (LASIX) 20 MG tablet Take 20 mg by mouth 2 (two) times daily.       Marland Kitchen GLIPIZIDE XL 5 MG 24 hr tablet Take 5 mg by mouth daily.       . hydrocortisone (ANUSOL-HC) 25 MG  suppository Place 25 mg rectally 2 (two) times daily.      Marland Kitchen loperamide (IMODIUM) 2 MG capsule Take 2 mg by mouth as needed. For runny stools      . LORazepam (ATIVAN) 0.5 MG tablet Take 0.5 mg by mouth every 4 (four) hours as needed. For anxiety       . meclizine (ANTIVERT) 25 MG tablet Take 12.5 mg by mouth 3 (three) times daily as needed.       . ondansetron (ZOFRAN) 4 MG tablet Take 1 tablet (4 mg total) by mouth every 6 (six) hours as needed for nausea.  30 tablet  1  . pantoprazole (PROTONIX) 40 MG tablet Take 40 mg by mouth daily.      . potassium chloride SA (K-DUR,KLOR-CON) 20 MEQ tablet Take 20 mEq by mouth daily.       . saxagliptin HCl (ONGLYZA) 5 MG TABS tablet Take 5 mg by mouth daily.      . simvastatin (ZOCOR) 40 MG tablet Take 40 mg by mouth every evening.      . tiotropium (SPIRIVA) 18 MCG inhalation capsule Place 18 mcg into inhaler and inhale daily.      . vancomycin (VANCOCIN) 125 MG capsule Take 1 capsule (125 mg total) by mouth 4 (four) times daily.  56  capsule  0  . zolpidem (AMBIEN) 5 MG tablet Take 5 mg by mouth at bedtime as needed for sleep.      . nitroGLYCERIN (NITROSTAT) 0.4 MG SL tablet Place 1 tablet (0.4 mg total) under the tongue every 5 (five) minutes as needed for chest pain.  25 tablet  3   No current facility-administered medications for this visit.    Allergies as of 11/16/2012  . (No Known Allergies)   Past Surgical History  Procedure Laterality Date  . Hemicolectomy  2003  . Appendectomy    . Colonoscopy  04/2001    Dr. Karilyn Cota- colon carcinoma  . Esophagogastroduodenoscopy  04/2001    Dr. Karilyn Cota  . Esophagogastroduodenoscopy  08/05/10    Dr. Rinaldo Ratel ring, hiatal hernia  . Colonoscopy  09/04/09    Dr. Chauncy Passy, hemorrhoids  . Atherectomy  07/17/11  . Coronary angioplasty  07/17/11    rotablator  . Colon surgery    . Cataract extraction, bilateral    . Esophagogastroduodenoscopy  03/01/2012    Dr. Jena Gauss- gastric erosions, small  hiatal hernia, +hpylori,=treated with prevpac  . Colonoscopy  03/11/2012    Dr. Rourk-s/p R hemicolectomy, pancolonic diverticulosis, tubular adenoma, hyperplastic polyp    ROS:  General: Negative for  fever, chills, fatigue, weakness. Weight up 8 pounds since OV 2 weeks ago. ENT: Negative for hoarseness, difficulty swallowing , nasal congestion. CV: Negative for chest pain, angina, palpitations, dyspnea on exertion, peripheral edema.  Respiratory: Negative for dyspnea at rest, dyspnea on exertion, cough, sputum, wheezing.  GI: See history of present illness. GU:  Negative for dysuria, hematuria, urinary incontinence, urinary frequency, nocturnal urination.  Endo: Negative for unusual weight change.    Physical Examination:   BP 114/54  Pulse 76  Temp(Src) 97.6 F (36.4 C) (Oral)  Ht 5\' 6"  (1.676 m)  Wt 181 lb 6.4 oz (82.283 kg)  BMI 29.29 kg/m2  General: Well-nourished, well-developed in no acute distress. Accompanied by [facility] Staff. Eyes: No icterus. Mouth: Oropharyngeal mucosa moist and pink , no lesions erythema or exudate. Lungs: Clear to auscultation bilaterally.  Heart: Regular rate and rhythm, no murmurs rubs or gallops.  Abdomen: Bowel sounds are normal, nondistended, no hepatosplenomegaly or masses, no abdominal bruits or hernia , no rebound or guarding. RUQ tenderness on exam.   Extremities: No lower extremity edema. No clubbing or deformities. Neuro: Alert and oriented x 4   Skin: Warm and dry, no jaundice.   Psych: Alert and cooperative, normal mood and affect.

## 2012-11-17 DIAGNOSIS — K219 Gastro-esophageal reflux disease without esophagitis: Secondary | ICD-10-CM | POA: Insufficient documentation

## 2012-11-17 NOTE — Assessment & Plan Note (Addendum)
C/O refractory heartburn worse with meals. C/o N/V related to smell with some improvement over the past two weeks. He has solid food/pill esophageal dysphagia. Last EGD 02/2012. See PSH. Diarrhea improved, ?etiology.  1. BPE. 2. Abd u/s. 3. Change PPI to pantoprazole 30mg  daily. 4. Complete Vancomycin.

## 2012-11-17 NOTE — Progress Notes (Signed)
CC'd to PCP 

## 2012-11-18 LAB — BASIC METABOLIC PANEL
C difficile toxin A/B: NEGATIVE
Creatinine: 1.4 mg/dL — AB (ref 0.6–1.3)
Potassium: 4.5 mmol/L

## 2012-11-23 ENCOUNTER — Ambulatory Visit (HOSPITAL_COMMUNITY)
Admission: RE | Admit: 2012-11-23 | Discharge: 2012-11-23 | Disposition: A | Discharge status: Home / Self Care | Payer: Medicare Other | Source: Ambulatory Visit | Attending: Gastroenterology | Admitting: Gastroenterology

## 2012-11-23 ENCOUNTER — Other Ambulatory Visit: Payer: Self-pay | Admitting: Gastroenterology

## 2012-11-23 DIAGNOSIS — K219 Gastro-esophageal reflux disease without esophagitis: Secondary | ICD-10-CM

## 2012-11-23 DIAGNOSIS — R1310 Dysphagia, unspecified: Secondary | ICD-10-CM

## 2012-11-23 DIAGNOSIS — R112 Nausea with vomiting, unspecified: Secondary | ICD-10-CM

## 2012-11-23 DIAGNOSIS — R1011 Right upper quadrant pain: Secondary | ICD-10-CM

## 2012-11-23 DIAGNOSIS — K824 Cholesterolosis of gallbladder: Secondary | ICD-10-CM | POA: Insufficient documentation

## 2012-11-25 NOTE — Progress Notes (Signed)
Quick Note:  Patient has no evidence of esophageal stricture. Esophagus is a little "lazy". He needs to use plenty of liquid to wash down food and pills. Stay upright for at least 30 minutes after taking pills or eating. ______

## 2012-11-25 NOTE — Progress Notes (Signed)
Quick Note:  He has a small polyp in his gallbladder. No evidence of acute cholecystitis. His CBD is mildly dilated for age.  Cannot rule out gallbladder dysfunction as cause of RUQ pain, N/V.   Let son know that I plan to discuss next step with Dr. Jena Gauss. He may recommend MRCP or HIDA? ______

## 2012-12-01 ENCOUNTER — Other Ambulatory Visit: Payer: Self-pay | Admitting: Gastroenterology

## 2012-12-01 DIAGNOSIS — R1011 Right upper quadrant pain: Secondary | ICD-10-CM

## 2012-12-01 DIAGNOSIS — K838 Other specified diseases of biliary tract: Secondary | ICD-10-CM

## 2012-12-01 NOTE — Progress Notes (Signed)
Scheduled for HIDA on Monday Oct 13th at 10:00 and [facility] is aware

## 2012-12-01 NOTE — Progress Notes (Signed)
Quick Note:  Per RMR.  Proceed with HIDA with fatty meal. ______

## 2012-12-06 ENCOUNTER — Encounter (HOSPITAL_COMMUNITY)
Admission: RE | Admit: 2012-12-06 | Discharge: 2012-12-06 | Disposition: A | Discharge status: Home / Self Care | Payer: Medicare Other | Source: Ambulatory Visit | Attending: Gastroenterology | Admitting: Gastroenterology

## 2012-12-06 ENCOUNTER — Encounter (HOSPITAL_COMMUNITY): Payer: Self-pay

## 2012-12-06 DIAGNOSIS — R1011 Right upper quadrant pain: Secondary | ICD-10-CM

## 2012-12-06 DIAGNOSIS — K838 Other specified diseases of biliary tract: Secondary | ICD-10-CM | POA: Insufficient documentation

## 2012-12-06 MED ORDER — TECHNETIUM TC 99M MEBROFENIN IV KIT
5.0000 | PACK | Freq: Once | INTRAVENOUS | Status: AC | PRN
  Administered 2012-12-06: 5 via INTRAVENOUS

## 2012-12-06 MED ORDER — SINCALIDE 5 MCG IJ SOLR
0.0200 ug/kg | Freq: Once | INTRAMUSCULAR | Status: AC
  Administered 2012-12-06: 1.64 ug via INTRAVENOUS

## 2012-12-07 ENCOUNTER — Telehealth: Payer: Self-pay | Admitting: Internal Medicine

## 2012-12-07 NOTE — Telephone Encounter (Signed)
Routing to [facility] 

## 2012-12-07 NOTE — Telephone Encounter (Signed)
Pt's son called to see if results were back from xray done yesterday, I told him it could be 7-10 business days before results were available and the nurse would be calling [facility] with those results.

## 2012-12-13 ENCOUNTER — Telehealth: Payer: Self-pay | Admitting: *Deleted

## 2012-12-13 NOTE — Progress Notes (Signed)
Quick Note:  Mildly diminished gb EF. Patient did not experience symptoms per radiology report. GB polyp on prior u/s and slightly dilated CBD. I will address with Dr. Jena Gauss on Wednesday. Does not appear to be the cause of his symptoms. Howard [name] to find out how patient doing with eating/diarrhea/abdominal pain. See other phone note. ______

## 2012-12-13 NOTE — Telephone Encounter (Signed)
Pt's son called wanting to know if pt's X-rays come back, pt's son would like for nurse to call him when they do. Please advise [phone]

## 2012-12-13 NOTE — Telephone Encounter (Signed)
Routing to [facility] for review. 

## 2012-12-13 NOTE — Telephone Encounter (Signed)
Please let the son know that patient's gallbladder is slightly "sluggish". He also has a gallbladder polyp. I am not sure that a surgeon would want to take it out based on these findings and patient's multiple medical problems. I want to discuss further with Dr. Jena Gauss but he has been out of town/will be back Wednesday.  How is Howard [name] doing with eating/abdominal pain/diarrhea????

## 2012-12-14 NOTE — Telephone Encounter (Signed)
Spoke with Howard [name] at [facility] and he is aware of results. He stated his eating, abd pain and diarrhea is better, he has not had any complaints lately.

## 2012-12-14 NOTE — Telephone Encounter (Signed)
Please call the son as well since he called [place] with questions.

## 2012-12-15 ENCOUNTER — Encounter: Payer: Self-pay | Admitting: Internal Medicine

## 2012-12-15 ENCOUNTER — Ambulatory Visit (INDEPENDENT_AMBULATORY_CARE_PROVIDER_SITE_OTHER): Payer: Medicare Other | Admitting: Internal Medicine

## 2012-12-15 VITALS — BP 133/66 | HR 72 | Ht 68.0 in | Wt 183.0 lb

## 2012-12-15 DIAGNOSIS — I2510 Atherosclerotic heart disease of native coronary artery without angina pectoris: Secondary | ICD-10-CM

## 2012-12-15 DIAGNOSIS — I10 Essential (primary) hypertension: Secondary | ICD-10-CM

## 2012-12-15 NOTE — Assessment & Plan Note (Signed)
The patient denies anginal symptoms. He has not had to take any nitroglycerin. He is fairly sedentary, and I encouraged him to increase his physical activity as tolerated.

## 2012-12-15 NOTE — Patient Instructions (Addendum)
Your physician recommends that you schedule a follow-up appointment in: 1 year with Dr Taylor  Your physician recommends that you continue on your current medications as directed. Please refer to the Current Medication list given to you today.   

## 2012-12-15 NOTE — Progress Notes (Signed)
HPI Howard [name] returns today for followup. He is a very pleasant 79 year old man with a history of coronary artery disease, status post stenting just over a year ago. He has dyslipidemia, and hypertension. He is diabetic. Despite his multiple problems, he is stable from a cardiovascular perspective. H He denies chest pain, shortness of breath, or peripheral edema. He is bothered by arthritis and walks with a cane. No syncope. Recently, he has been bothered by an upper inspiratory tract infection, which is improving. He denies fevers or chills. No Known Allergies   Current Outpatient Prescriptions  Medication Sig Dispense Refill  . albuterol (PROAIR HFA) 108 (90 BASE) MCG/ACT inhaler Inhale 2 puffs into the lungs every 4 (four) hours as needed. For shortness of breath      . carvedilol (COREG) 6.25 MG tablet Take 3.125 mg by mouth 2 (two) times daily.       . cholestyramine light (PREVALITE) 4 G packet Take 2 g by mouth daily.      . Cinnamon 500 MG TABS Take 500 mg by mouth daily.      . clopidogrel (PLAVIX) 75 MG tablet Take 1 tablet (75 mg total) by mouth daily.  90 tablet  3  . dicyclomine (BENTYL) 20 MG tablet Take 20 mg by mouth 4 (four) times daily as needed (Stomach Cramps).      . furosemide (LASIX) 20 MG tablet Take 20 mg by mouth 2 (two) times daily.       Marland Kitchen GLIPIZIDE XL 5 MG 24 hr tablet Take 5 mg by mouth daily.       Marland Kitchen guaiFENesin (MUCINEX) 600 MG 12 hr tablet Take 1,200 mg by mouth 2 (two) times daily.      . hydrocortisone (ANUSOL-HC) 25 MG suppository Place 25 mg rectally 2 (two) times daily.      . lansoprazole (PREVACID) 30 MG capsule Take 1 capsule (30 mg total) by mouth daily before breakfast.  30 capsule  11  . loperamide (IMODIUM) 2 MG capsule Take 2 mg by mouth as needed. For runny stools      . LORazepam (ATIVAN) 0.5 MG tablet Take 0.5 mg by mouth every 4 (four) hours as needed. For anxiety       . meclizine (ANTIVERT) 25 MG tablet Take 12.5 mg by mouth 3 (three) times  daily as needed.       . ondansetron (ZOFRAN) 4 MG tablet Take 1 tablet (4 mg total) by mouth every 6 (six) hours as needed for nausea.  30 tablet  1  . potassium chloride SA (K-DUR,KLOR-CON) 20 MEQ tablet Take 20 mEq by mouth daily.       . saxagliptin HCl (ONGLYZA) 5 MG TABS tablet Take 5 mg by mouth daily.      . simvastatin (ZOCOR) 40 MG tablet Take 40 mg by mouth every evening.      . tiotropium (SPIRIVA) 18 MCG inhalation capsule Place 18 mcg into inhaler and inhale daily.      Marland Kitchen zolpidem (AMBIEN) 5 MG tablet Take 5 mg by mouth at bedtime as needed for sleep.      . nitroGLYCERIN (NITROSTAT) 0.4 MG SL tablet Place 1 tablet (0.4 mg total) under the tongue every 5 (five) minutes as needed for chest pain.  25 tablet  3   No current facility-administered medications for this visit.     Past Medical History  Diagnosis Date  . Hypertension   . Hyperlipidemia   . Adenocarcinoma 2003  COLON  . PVD (peripheral vascular disease)     ABI 0.78 RIGHT AND 0.80 LEFT 4/11  . Osteoarthritis     OF KNEES  . Kidney stone   . Schatzki's ring 08/05/10    egd with Dr. Jena Gauss  . Hiatal hernia 08/05/10    egd with Dr. Jena Gauss  . Esophageal dysmotility   . Microscopic colitis   . Internal hemorrhoids   . Diverticula of colon   . CHF (congestive heart failure)   . Coronary artery stenosis     a. 06/2011 Cath: LM 20, LAD min irregs, D1 95 - small,  LCX 60p, RCA 63m, 50d, PLA 50ost;  b. 06/2011 PCI/Rota  RCA  -> 3.0x27mm Promus Element DES  . Angina   . COPD (chronic obstructive pulmonary disease)   . Asthma   . Pneumonia ~ 2005; 09/2005  . Shortness of breath 07/17/11    "laying down"  . Type II diabetes mellitus   . GERD (gastroesophageal reflux disease)   . Anxiety   . Ischemic cardiomyopathy     a. 05/2011 Echo: EF 45-50%, inf/post HK  . H. pylori infection 03/01/12    treated with prevpac  . Tubular adenoma   . Hyperplastic polyp of intestine     ROS:   All systems reviewed and negative  except as noted in the HPI.   Past Surgical History  Procedure Laterality Date  . Hemicolectomy  2003  . Appendectomy    . Colonoscopy  04/2001    Dr. Karilyn Cota- colon carcinoma  . Esophagogastroduodenoscopy  04/2001    Dr. Karilyn Cota  . Esophagogastroduodenoscopy  08/05/10    Dr. Rinaldo Ratel ring, hiatal hernia  . Colonoscopy  09/04/09    Dr. Chauncy Passy, hemorrhoids  . Atherectomy  07/17/11  . Coronary angioplasty  07/17/11    rotablator  . Colon surgery    . Cataract extraction, bilateral    . Esophagogastroduodenoscopy  03/01/2012    Dr. Jena Gauss- gastric erosions, small hiatal hernia, +hpylori,=treated with prevpac  . Colonoscopy  03/11/2012    Dr. Huey Romans R hemicolectomy, pancolonic diverticulosis, tubular adenoma, hyperplastic polyp     Family History  Problem Relation Age of Onset  . Coronary artery disease Father   . Coronary artery disease Brother     CABG  . Diabetes Brother   . Coronary artery disease Brother   . Heart Problems Mother      History   Social History  . Marital Status: Married    Spouse Name: N/A    Number of Children: N/A  . Years of Education: N/A   Occupational History  . Not on file.   Social History Main Topics  . Smoking status: Former Smoker -- 20 years    Types: Cigarettes    Quit date: 02/25/2008  . Smokeless tobacco: Never Used  . Alcohol Use: No  . Drug Use: No  . Sexual Activity: Not Currently   Other Topics Concern  . Not on file   Social History Narrative   Lives in [place]. Married. Wife in [place].   Son lives with him   Caffeine Use-     BP 133/66  Pulse 72  Ht 5\' 8"  (1.727 m)  Wt 183 lb (83.008 kg)  BMI 27.83 kg/m2  Physical Exam:  stable appearing elderly man, NAD HEENT: Unremarkable Neck:  7 cm JVD, no thyromegally Lungs:  Clear with no wheezes, rales, or rhonchi. HEART:  Regular rate rhythm, no murmurs, no rubs, no clicks, heart sounds are  distant. Abd:  soft, soft, obese,positive bowel sounds, no  organomegally, no rebound, no guarding Ext:  2 plus pulses, no edema, no cyanosis, no clubbing Skin:  No rashes no nodules Neuro:  CN II through XII intact, motor grossly intact    Assess/Plan:

## 2012-12-15 NOTE — Assessment & Plan Note (Signed)
Today's blood pressure is well controlled. He'll continue his current medical therapy

## 2012-12-16 NOTE — Telephone Encounter (Signed)
Please let son noted that I spoke with Dr. Jena Gauss regarding HIDA scan results. According to nursing staff at [facility] patient is doing better. Dr. Jena Gauss would like to see him in the office for followup. It is not clear that gallbladder surgery would be beneficial for him at this time.  Please schedule appointment with Dr. Benard Rink only in the next 2-4 weeks.

## 2012-12-17 NOTE — Telephone Encounter (Signed)
Tried to call son- [facility]

## 2012-12-17 NOTE — Telephone Encounter (Signed)
Left message with nurse at [facility] for patient to come on 11/11 @ 10 with RMR

## 2012-12-17 NOTE — Telephone Encounter (Signed)
Howard [name], please schedule ov with RMR in 2-4 weeks. Pt is a resident at [facility].

## 2012-12-22 NOTE — Telephone Encounter (Signed)
pts son is aware

## 2013-01-04 ENCOUNTER — Encounter: Payer: Self-pay | Admitting: Internal Medicine

## 2013-01-04 ENCOUNTER — Ambulatory Visit (INDEPENDENT_AMBULATORY_CARE_PROVIDER_SITE_OTHER): Payer: Medicare Other | Admitting: Internal Medicine

## 2013-01-04 VITALS — BP 114/57 | HR 69 | Temp 98.1°F | Wt 185.6 lb

## 2013-01-04 DIAGNOSIS — K219 Gastro-esophageal reflux disease without esophagitis: Secondary | ICD-10-CM

## 2013-01-04 DIAGNOSIS — R1314 Dysphagia, pharyngoesophageal phase: Secondary | ICD-10-CM

## 2013-01-04 NOTE — Progress Notes (Signed)
Reminder in epic °

## 2013-01-04 NOTE — Progress Notes (Signed)
Primary Care Physician:  [name], MD Primary Gastroenterologist:  Dr. Jena Gauss  Pre-Procedure History & Physical: HPI:  Howard [name] is a 79 y.o. male here for followup of GERD. Vague upper abdominal discomfort. Ultrasound demonstrated a 7 mm gallbladder polyp. HIDA scan demonstrated gallbladder EF at 28%. Recent C. difficile infection. Cholestyramine stopped last week. Barium pill esophagram demonstrates esophageal dysmotility but no mechanical obstruction.   He continues on Prevacid 30 mg daily. He denies upper abdominal pain. States he's doing well -  dysphagia has improved. He needs to be careful with larger pills. Unfortunately, he lost his wife last week after 63 years of marriage. His only complaint today is chest and sinus congestion.  He reports being slated to see Dr. Renard Matter later in the month. Past Medical History  Diagnosis Date  . Hypertension   . Hyperlipidemia   . Adenocarcinoma 2003    COLON  . PVD (peripheral vascular disease)     ABI 0.78 RIGHT AND 0.80 LEFT 4/11  . Osteoarthritis     OF KNEES  . Kidney stone   . Schatzki's ring 08/05/10    egd with Dr. Jena Gauss  . Hiatal hernia 08/05/10    egd with Dr. Jena Gauss  . Esophageal dysmotility   . Microscopic colitis   . Internal hemorrhoids   . Diverticula of colon   . CHF (congestive heart failure)   . Coronary artery stenosis     a. 06/2011 Cath: LM 20, LAD min irregs, D1 95 - small,  LCX 60p, RCA 77m, 50d, PLA 50ost;  b. 06/2011 PCI/Rota  RCA  -> 3.0x54mm Promus Element DES  . Angina   . COPD (chronic obstructive pulmonary disease)   . Asthma   . Pneumonia ~ 2005; 09/2005  . Shortness of breath 07/17/11    "laying down"  . Type II diabetes mellitus   . GERD (gastroesophageal reflux disease)   . Anxiety   . Ischemic cardiomyopathy     a. 05/2011 Echo: EF 45-50%, inf/post HK  . H. pylori infection 03/01/12    treated with prevpac  . Tubular adenoma   . Hyperplastic polyp of intestine     Past Surgical History   Procedure Laterality Date  . Hemicolectomy  2003  . Appendectomy    . Colonoscopy  04/2001    Dr. Karilyn Cota- colon carcinoma  . Esophagogastroduodenoscopy  04/2001    Dr. Karilyn Cota  . Esophagogastroduodenoscopy  08/05/10    Dr. Rinaldo Ratel ring, hiatal hernia  . Colonoscopy  09/04/09    Dr. Chauncy Passy, hemorrhoids  . Atherectomy  07/17/11  . Coronary angioplasty  07/17/11    rotablator  . Colon surgery    . Cataract extraction, bilateral    . Esophagogastroduodenoscopy  03/01/2012    Dr. Jena Gauss- gastric erosions, small hiatal hernia, +hpylori,=treated with prevpac  . Colonoscopy  03/11/2012    Dr. Huey Romans R hemicolectomy, pancolonic diverticulosis, tubular adenoma, hyperplastic polyp    Prior to Admission medications   Medication Sig Start Date End Date Taking? Authorizing Provider  albuterol (PROAIR HFA) 108 (90 BASE) MCG/ACT inhaler Inhale 2 puffs into the lungs every 4 (four) hours as needed. For shortness of breath   Yes Historical Provider, MD  carvedilol (COREG) 6.25 MG tablet Take 3.125 mg by mouth 2 (two) times daily.    Yes Historical Provider, MD  Cinnamon 500 MG TABS Take 500 mg by mouth daily.   Yes Historical Provider, MD  clopidogrel (PLAVIX) 75 MG tablet Take 1 tablet (75 mg total)  by mouth daily. 12/16/11  Yes Kathleene Hazel, MD  dicyclomine (BENTYL) 20 MG tablet Take 20 mg by mouth 4 (four) times daily as needed (Stomach Cramps).   Yes Historical Provider, MD  furosemide (LASIX) 20 MG tablet Take 20 mg by mouth 2 (two) times daily.    Yes Historical Provider, MD  GLIPIZIDE XL 5 MG 24 hr tablet Take 5 mg by mouth daily.  01/13/12  Yes Historical Provider, MD  guaiFENesin (MUCINEX) 600 MG 12 hr tablet Take 1,200 mg by mouth 2 (two) times daily.   Yes Historical Provider, MD  hydrocortisone (ANUSOL-HC) 25 MG suppository Place 25 mg rectally 2 (two) times daily.   Yes Historical Provider, MD  lansoprazole (PREVACID) 30 MG capsule Take 1 capsule (30 mg total) by  mouth daily before breakfast. 11/16/12  Yes Tiffany Kocher, PA-C  loperamide (IMODIUM) 2 MG capsule Take 2 mg by mouth as needed. For runny stools 05/19/11  Yes Historical Provider, MD  LORazepam (ATIVAN) 0.5 MG tablet Take 0.5 mg by mouth every 4 (four) hours as needed. For anxiety    Yes Historical Provider, MD  meclizine (ANTIVERT) 25 MG tablet Take 12.5 mg by mouth 3 (three) times daily as needed.    Yes Historical Provider, MD  ondansetron (ZOFRAN) 4 MG tablet Take 1 tablet (4 mg total) by mouth every 6 (six) hours as needed for nausea. 11/03/12  Yes Tiffany Kocher, PA-C  potassium chloride SA (K-DUR,KLOR-CON) 20 MEQ tablet Take 20 mEq by mouth daily.    Yes Historical Provider, MD  saxagliptin HCl (ONGLYZA) 5 MG TABS tablet Take 5 mg by mouth daily.   Yes Historical Provider, MD  simvastatin (ZOCOR) 40 MG tablet Take 40 mg by mouth every evening.   Yes Historical Provider, MD  tiotropium (SPIRIVA) 18 MCG inhalation capsule Place 18 mcg into inhaler and inhale daily.   Yes Historical Provider, MD  zolpidem (AMBIEN) 5 MG tablet Take 5 mg by mouth at bedtime as needed for sleep.   Yes Historical Provider, MD  cholestyramine light (PREVALITE) 4 G packet Take 2 g by mouth daily.    Historical Provider, MD  nitroGLYCERIN (NITROSTAT) 0.4 MG SL tablet Place 1 tablet (0.4 mg total) under the tongue every 5 (five) minutes as needed for chest pain. 07/18/11 07/17/12  Ok Anis, NP    Allergies as of 01/04/2013  . (No Known Allergies)    Family History  Problem Relation Age of Onset  . Coronary artery disease Father   . Coronary artery disease Brother     CABG  . Diabetes Brother   . Coronary artery disease Brother   . Heart Problems Mother     History   Social History  . Marital Status: Married    Spouse Name: N/A    Number of Children: N/A  . Years of Education: N/A   Occupational History  . Not on file.   Social History Main Topics  . Smoking status: Former Smoker -- 20  years    Types: Cigarettes    Quit date: 02/25/2008  . Smokeless tobacco: Never Used  . Alcohol Use: No  . Drug Use: No  . Sexual Activity: Not Currently   Other Topics Concern  . Not on file   Social History Narrative   Lives in [place]. Married. Wife in [place].   Son lives with him   Caffeine Use-    Review of Systems: See HPI, otherwise negative ROS  Physical Exam: BP  114/57  Pulse 69  Temp(Src) 98.1 F (36.7 C) (Oral)  Wt 185 lb 9.6 oz (84.188 kg) General:  Frail elderly gentleman confined to a wheelchair. He appears comfortable conversant and in no acute distress. He is accompanied by 2 nursing home attendant well-  Eyes:  Sclera clear, no icterus.   Conjunctiva pink. Ears:  Normal auditory acuity. Nose:  No deformity, discharge,  or lesions. Mouth:  No deformity or lesions. Neck:  Supple; no masses or thyromegaly. No significant cervical adenopathy. Lungs:  Clear throughout to auscultation.   No wheezes, crackles, or rhonchi. No acute distress. Heart:  Regular rate and rhythm; no murmurs, clicks, rubs,  or gallops. Abdomen: Non-distended, normal bowel sounds.  Soft and nontender without appreciable mass or hepatosplenomegaly.  Pulses:  Normal pulses noted. Extremities:  Without clubbing or edema.  Impression:  79 year old with esophageal dysmotility and vague solid/pill dysphagia doing better at this time,clinically. History of C. difficile infection. History of gallbladder polyp and low gallbladder EF but clinically doing well from a gallbladder standpoint.  Discussed the increased risk of gallbladder cancer in large and enlarging gallbladder polyps.  Bowel function back to baseline. GERD symptoms well controlled on PPI.  Recommendations:  Continue Prevacid 30 mg daily  Recommend a repeat gallbladder ultrasound to followup on polyps in 2 years. Would not pursue cholecystectomy at this time since patient is clinically doing well.  Plan office visit in 3  months.  See Dr. Renard Matter ASAP  regarding chest and sinus congestion

## 2013-01-04 NOTE — Patient Instructions (Addendum)
Continue Prevacid 30 mg daily  Repeat gallbladder ultrasound in 2 years  See Dr. Renard Matter for chest congestion   Office visit in 3 months

## 2013-04-06 ENCOUNTER — Non-Acute Institutional Stay (SKILLED_NURSING_FACILITY): Payer: Medicare Other | Admitting: Internal Medicine

## 2013-04-06 DIAGNOSIS — I255 Ischemic cardiomyopathy: Secondary | ICD-10-CM

## 2013-04-06 DIAGNOSIS — E785 Hyperlipidemia, unspecified: Secondary | ICD-10-CM

## 2013-04-06 DIAGNOSIS — R05 Cough: Secondary | ICD-10-CM

## 2013-04-06 DIAGNOSIS — D649 Anemia, unspecified: Secondary | ICD-10-CM

## 2013-04-06 DIAGNOSIS — R059 Cough, unspecified: Secondary | ICD-10-CM

## 2013-04-06 DIAGNOSIS — C189 Malignant neoplasm of colon, unspecified: Secondary | ICD-10-CM

## 2013-04-06 DIAGNOSIS — K219 Gastro-esophageal reflux disease without esophagitis: Secondary | ICD-10-CM

## 2013-04-06 DIAGNOSIS — R197 Diarrhea, unspecified: Secondary | ICD-10-CM

## 2013-04-06 DIAGNOSIS — E119 Type 2 diabetes mellitus without complications: Secondary | ICD-10-CM

## 2013-04-06 DIAGNOSIS — I2589 Other forms of chronic ischemic heart disease: Secondary | ICD-10-CM

## 2013-04-12 ENCOUNTER — Encounter: Payer: Self-pay | Admitting: Internal Medicine

## 2013-04-12 NOTE — Progress Notes (Signed)
Patient ID: Howard [name], male   DOB: December 16, 1929, 80 y.o.   MRN: [id number]   This is an acute visit.  Level care skilled.  [place].  Chief complaint-acute visit status post transfer to our service with history of CDF-coronary artery disease-irritable bowel syndrome-.  History of present illness.  Patient is a pleasant 80 year old male who was recently admitted to the nursing home about 6 months ago secondary to increased care needs weakness and recent hospitalization for a fall with generalized weakness and abdominal pain and diarrhea complicated with dehydration.  Previously patient had been treated for C. difficile with oral vancomycin.  During his hospitalization he received IV fluids and was seen by GI.  He does have a previous history of irritable bowel syndrome anemia of chronic disease-GI service felt he didn't need any further treatment for the colitis.  He was also diagnosed with acute on chronic CHF secondary to shortness of breath in the hospital.  He also was hypokalemic and this was repleted  Cardiology concluded before discharge he had acute on chronic systolic congestive heart failure with a previous echocardiogram showing an ejection fraction of 45-50 percent  thought his CHF was aggravated by the IV fluid overload he was diuresed with Lasix and apparently this improved.  It was thought he was too weak to go home and thus he has been it appears at the nursing home since early July-he is now being transferred to our service apparently because of his request.  Of note Dr. Dellia Nims previously did treat his wife whowass a long-term resident at facility and who recently passed.  Previous medical history.  History of falls at home.  History CDiF.  History irritable bowel syndrome.  History of colon carcinoma status post hemi colectomy in 2003  Diabetes2.  Acute on chronic systolic CHF ejection fraction 45-50%.  Coronary artery disease.  Anemia of  chronic disease.  Iron deficiency anemia.  Hypertension.  GERD.  History of dizziness.  Insomnia.  Anxiety.  Surgical history.  2003-history Hemicolectomy  secondary to colon carcinoma-also history of appendectomy  Bilateral cataract extractions.  Social history.  Patient recently lost his wife of 24 years she had been a long-term resident of this facility.  Previously had lived with his son.  20 year history of smoking he quit in January of 2010.  No history of alcohol or illicit drug use.  Family history.  Father deceased with a history of coronary artery disease.  Mother deceased with a history of heart problems.  Has a brother who had a history of coronary artery disease status post CABG as well as diabetes  Medications. Fasting 20 mEq daily.  Plavix 75 mg daily.  Cinnamon one capsule daily.  Spiriva inhaler 1 capaily.  Prevacid 30 mg daily.  Lasix 20 mg twice a day.  Iron 325 mg twice a day.  Coreg 3.125 mg twice a day.  Systane eyedrops twice a day into each eye.  Antivert 12.5 mg 3 times a day.  Zocor 40 mg daily.  Restoril 150 mg each bedtime when necessary.  Ambien 5 mg each bedtime when necessary.  Onglyza 5 mg daily.  Glucotrol XL 2.5 mg daily    .  Review of systems.  In general denies any fever or chills there has have occasional cough that is not very productive denies any shortness of breath.  Skin-does not complaining of any itching or rashes.  Head ears eyes nose mouth and throat-does not complaining of visual changes--has occasional  sore throat--no nasal discharge.  Respiratory- does complain of a cough does not complaining of increased shortness of breath.  Cardiac-does not complaining of chest pain does have a history of CHF.  GI-fairly extensive history as noted above but currently is denying any diarrhea abdominal pain nausea vomiting diarrhea or constipation.  GU-does not complaining of dysuria.  Muscle  skeletal does not complaining of joint pain.  Neurologic no complaints of headache dizziness or syncopal-type feelings.  Psych is not complaining of depression does have some history of an anxiety.  Physical exam.  He is afebrile pulse 70 respirations 24 blood pressure 130/84 this appears to be relatively baseline his weight also appears to be relatively stable at 203.  In general this is a pleasant elderly male in no distress sitting comfortably in his wheelchair.  His skin is warm and dry.   Eyes he does have a disconjugate gaze left eye --visual acuity appears grossly intact sclera and conjunctiva are clear.  Oropharynx clear mucous membranes moist.  Chest is clear to auscultation there is some mild expiratory congestion but this clears with cough.  Heart is regular rate and rhythm without murmur gallop or rub he has trace lower extremity edema.  Abdomen is soft nontender somewhat obese with positive bowel sounds he has a well-healed surgical scar.  Muscle skeletal moves all extremities at baseline appears to have some lower the weakness he ambulates in a wheelchair.  Neurologic is grossly intact speech is clear and do not see any lateralizing findings.  Psych he is alert and oriented x3 pleasant and appropriate.  Labs.  02/12/2013.  Hemoglobin A1c 5.6.  11/08/2012  Sodium 141 potassium 4.5 BUN 23 creatinine 1.35.  11/04/2012.  WBC 6.3 hemoglobin 11.5 platelets 204.  Liver function tests within normal limits except albumin of 2.8.  Assessment and plan.  #1-cough-lung exam was fairly benign we'll order Mucinex 600 mg twice a day for 7 days apparently patient has been receiving relief with this in the past.  Also will add Chloraseptic spray per patient's request occasional sore throat twice a day when necessary  #2-CHF-- cardiomyopathy- this appears to be stable he is on Lasix with potassium supplementation as well as a beta blocker-Will update metabolic  panel-his weights have been stable   #3 history of diarrhea-apparently this has resolved he does have significant GI issues will need followup with GI.  #4-history of diabetes type 2-this appears to be under good control on current agents blood sugars appear to be pretty consistently in the low 100s.--Will update hemoglobin A1c  #5-history coronary artery disease-he appears to be largely asymptomatic currently he is on Plavix.  #6-history of GERD he is followed by GI he is on Prevacid apparently this has been stable.  #7-history of anemia he is on iron supplementation we'll update CBC.  #8-history of hyperlipidemia-he is on Zocor Will update liver function tests as well as a fasting lipid panel.  #9-history of colon cancer status post  themi- colectomy--this is followed by Dr.Rourke--per recent notes I this appears to be stable he mainly has been seen  By GI it appears for his diarrhea irritable bowel syndrome and GERD symptoms  [id number]-of note greater than 35 minutes spent assessing patient-ad dressing his concerns-and  coordinating and formulating a plan of care for numerous diagnoses-of note greater than 50% of time spent coordinating plan of care    .  Marland Kitchen

## 2013-04-21 ENCOUNTER — Non-Acute Institutional Stay (SKILLED_NURSING_FACILITY): Payer: Medicare Other | Admitting: Internal Medicine

## 2013-04-21 DIAGNOSIS — E119 Type 2 diabetes mellitus without complications: Secondary | ICD-10-CM

## 2013-04-21 DIAGNOSIS — I2589 Other forms of chronic ischemic heart disease: Secondary | ICD-10-CM

## 2013-04-21 DIAGNOSIS — J449 Chronic obstructive pulmonary disease, unspecified: Secondary | ICD-10-CM

## 2013-04-21 DIAGNOSIS — I255 Ischemic cardiomyopathy: Secondary | ICD-10-CM

## 2013-04-21 IMAGING — CR DG CHEST 2V
2 series · 2 of 2 positions shown · non-contrast
Comparison: 06/01/2011 and earlier.

CLINICAL DATA: 82-year-old male with CHF, bronchitis, dysphagia.
Shortness of breath.

CHEST - 2 VIEW

[view not recorded (1 of 2)]
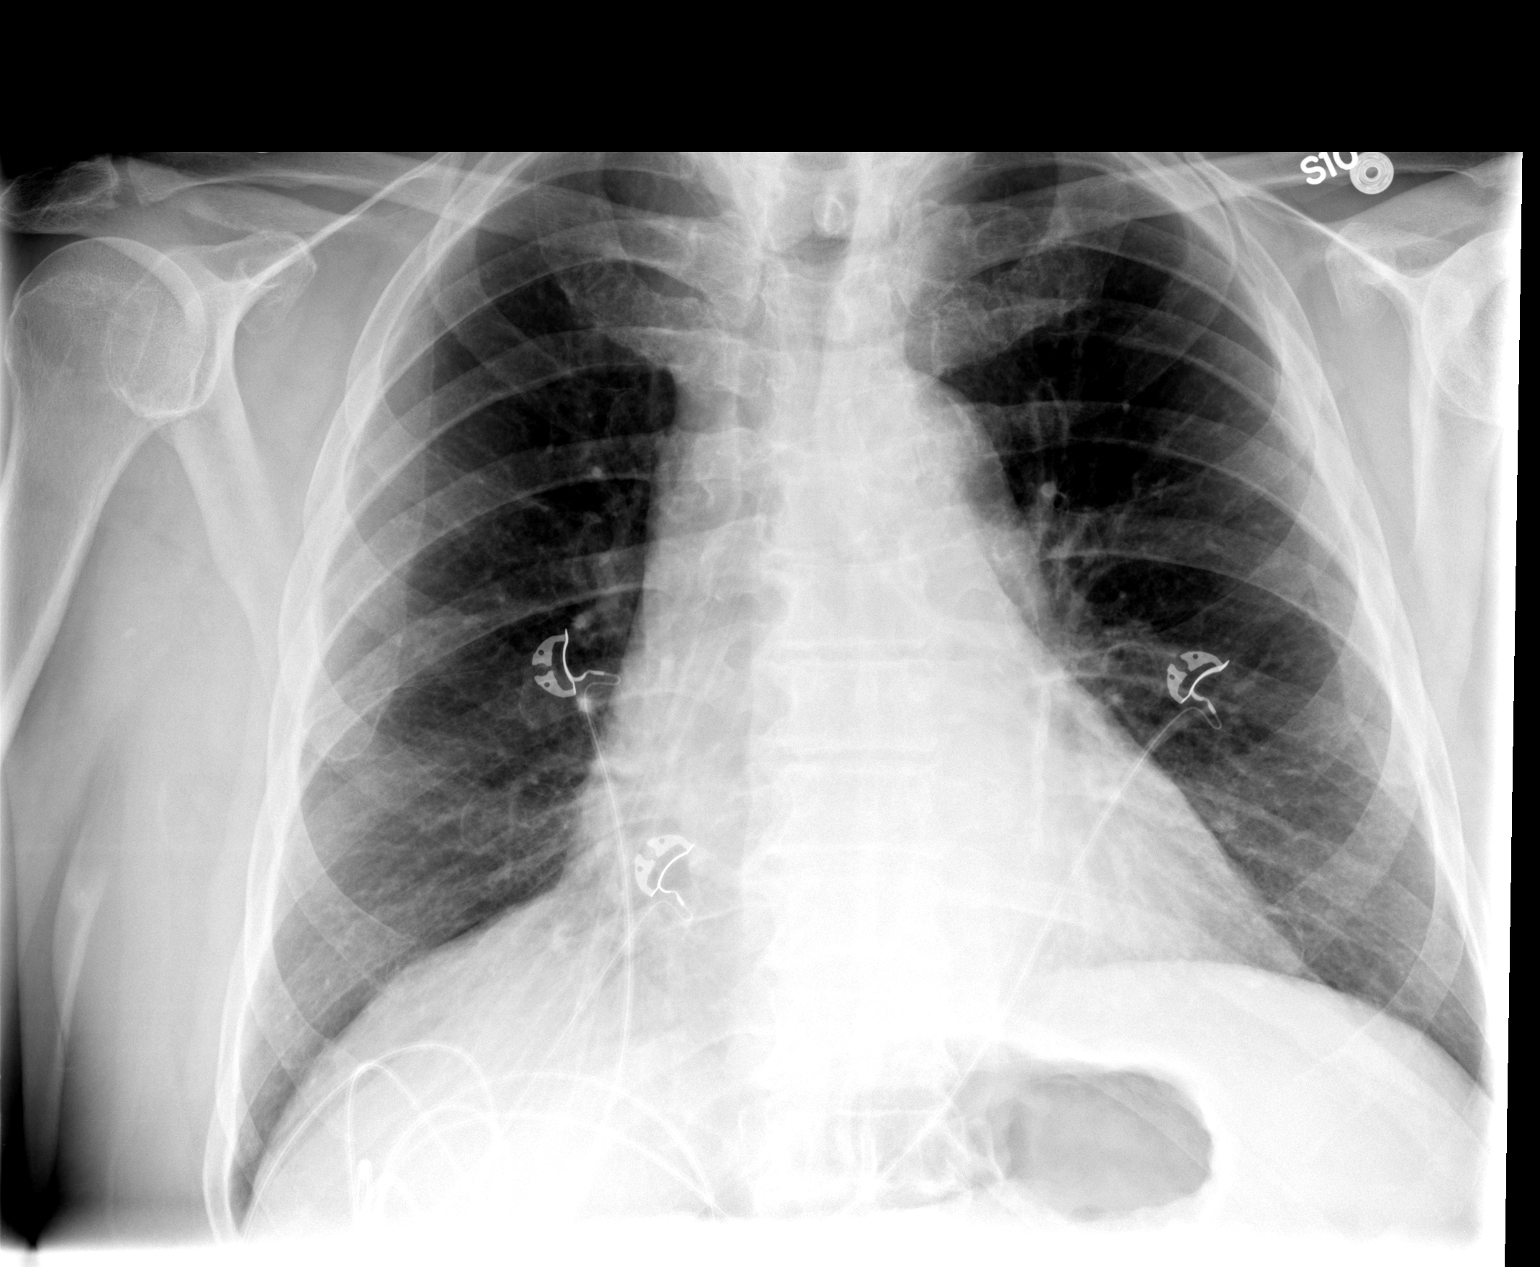

[view not recorded (2 of 2)]
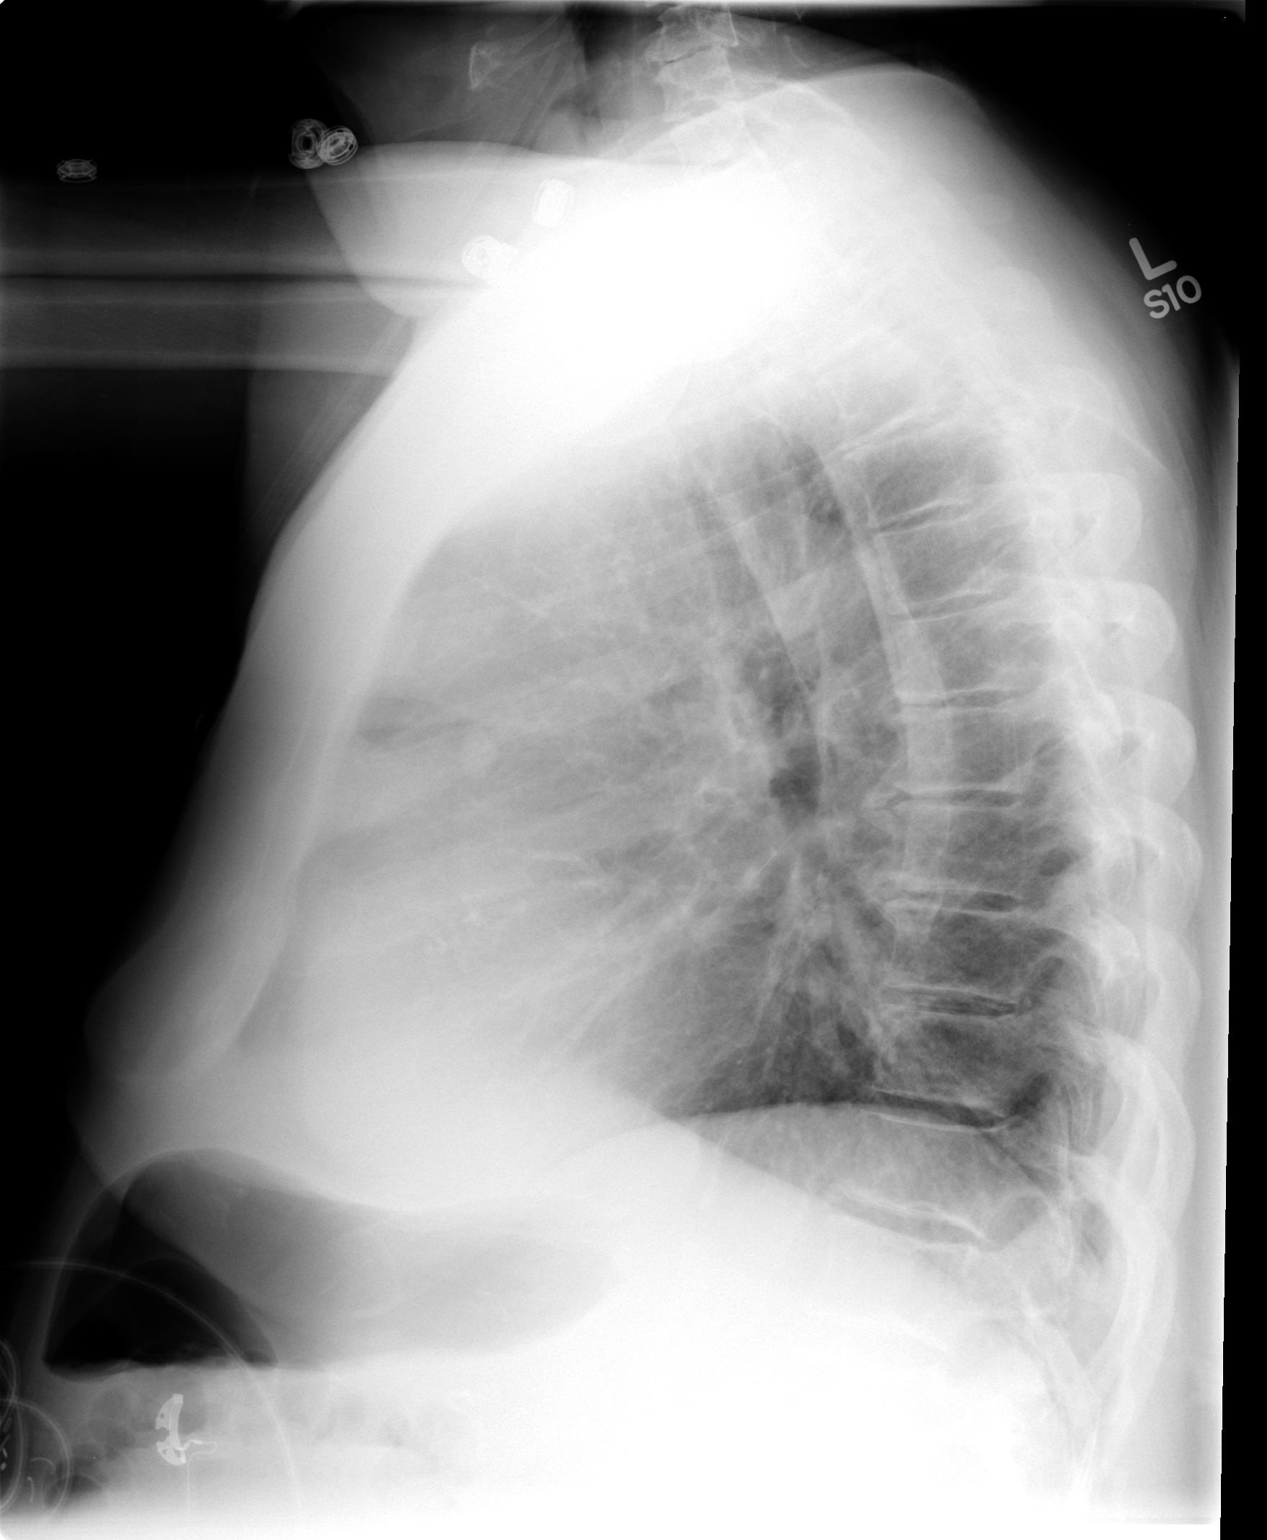

[2 of 2 positions shown; findings below may reference images not displayed]

FINDINGS: Stable cardiomegaly and mediastinal contours.  Visualized
tracheal air column is within normal limits.  Stable lung volumes.
No pneumothorax, pulmonary edema, pleural effusion or confluent
pulmonary opacity. Stable visualized osseous structures.
IMPRESSION: Stable cardiomegaly.  No acute cardiopulmonary abnormality.

## 2013-04-22 IMAGING — NM NM MYOCAR SINGLE W/SPECT W/WALL MOTION & EF
2 series · 12 of 12 positions shown · non-contrast
Comparison: none

Ordering Physician: HELENA GARCIA DONCEL

[place] Physician: [REDACTED]al Data: 82-year-old male with history of bronchitis, acute
on chronic combined congestive heart failure symptoms, and LVEF of
45% with evidence of previous inferior scar and underlying CAD.  He
is referred for the assessment of ischemia.
NUCLEAR MEDICINE STRESS MYOVIEW STUDY WITH SPECT AND LEFT
VENTRICULAR EJECTION FRACTION
Radionuclide Data: One-day rest/stress protocol performed with
[DATE] mCi of Cc-66m Myoview.
Stress Data: Dobutamine was infused in standard fashion beginning
at 5 mcg/kg per minute and held at that rate due to adequate heart
rate response.  Heart rate increased to 146 beats per minute which
exceeded 100% of the maximum age predicted heart rate response.
Blood pressure remained stable at 118/58.  Frequent atrial and
ventricular ectopy were noted throughout the study including burst
of NSVT.  Abnormal ST-segment changes were noted in leads I, AVL,
V1, V2, toward peak heart rate.  He reported no chest pain.
EKG: Baseline ECG showed normal sinus rhythm at 89 beats per minute
with nonspecific ST-T changes, probable old inferior infarct
pattern.
Scintigraphic Data: Analysis of the raw perfusion data finds arms
down imaging with diaphragmatic attenuation as well as dose
extravasation noted in the region of the right antecubital fossa.
Tomographic views obtained using the short axis, vertical long
axis, and horizontal long axis planes.  There is a moderate sized,
moderate to severe intensity, inferolateral defect noted extending
from near the apex to the base, most consistent with scar and prior
infarct in this region.  There is reversible reversibility noted in
the lateral wall of this defect, mainly seen in the mid to basal
region.  Also reversibility in the anteroseptum from apex to base.
Gated imaging reveals an EDV of 123, ESV of 79, T I D ratio of
1.06, LVEF calculated at 35%.  There is relatively global
hypokinesis, more prominent in the anteroapical and inferoseptal
distributions.

[Series 1: cs cardiac tc hi dose · 6.41mm/px · 6 of 512 frames shown]
[frame 43/512]
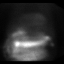
[frame 128/512]
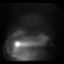
[frame 214/512]
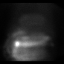
[frame 299/512]
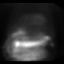
[frame 384/512]
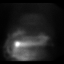
[frame 470/512]
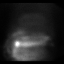

[Series 1: cr cardiac tc low dose · 6.41mm/px · 6 of 64 frames shown]
[frame 6/64]
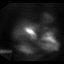
[frame 16/64]
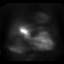
[frame 27/64]
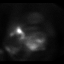
[frame 38/64]
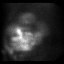
[frame 48/64]
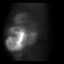
[frame 59/64]
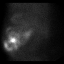

[12 of 12 positions shown; findings below may reference images not displayed]

IMPRESSION: Abnormal dobutamine Myoview as outlined.  The patient experienced
no chest pain and was hemodynamically stable, however there were
abnormal ST-segment changes as noted, and also frequent atrial and
ventricular ectopy including a burst of NSVT. Artifact noted
including arms down imaging with diaphragmatic attenuation.
Perfusion defects are consistent with underlying CAD, apparent
ischemic cardiomyopathy with inferolateral scar, and ischemia noted
within the anteroseptal region as well as mid to basal portion of
the lateral wall. LVEF calculated at 35% with more prominent
hypokinesis in the anteroapical and inferoseptal distribution.

## 2013-04-23 NOTE — Progress Notes (Signed)
Patient ID: Howard [name], male   DOB: 01-16-1930, 80 y.o.   MRN: [id number] [facility] Chief complaint; transfer to our service History; this is a man I actually know from caring for his wife in this facility for a prolonged period. It would appear that he has been in the facility for her really quite a long period of time. His index hospitalization was from 6/25 through 7/1. He had presented with falling at home and had pseudomembranous colitis. He was treated and he improved. For reasons that are not totally clear to me he never returned home. The patient tells me this is largely because his son who is his caregiver has his own list of medical problems and simply could not take care of him.  Past Medical History  Diagnosis Date  . Hypertension   . Hyperlipidemia   . Adenocarcinoma 2003    COLON  . PVD (peripheral vascular disease)     ABI 0.78 RIGHT AND 0.80 LEFT 4/11  . Osteoarthritis     OF KNEES  . Kidney stone   . Schatzki's ring 08/05/10    egd with Dr. Gala Romney  . Hiatal hernia 08/05/10    egd with Dr. Gala Romney  . Esophageal dysmotility   . Microscopic colitis   . Internal hemorrhoids   . Diverticula of colon   . CHF (congestive heart failure)   . Coronary artery stenosis     a. 06/2011 Cath: LM 20, LAD min irregs, D1 95 - small,  LCX 60p, RCA 29m, 50d, PLA 50ost;  b. 06/2011 PCI/Rota  RCA  -> 3.0x28mm Promus Element DES  . Angina   . COPD (chronic obstructive pulmonary disease)   . Asthma   . Pneumonia ~ 2005; 09/2005  . Shortness of breath 07/17/11    "laying down"  . Type II diabetes mellitus   . GERD (gastroesophageal reflux disease)   . Anxiety   . Ischemic cardiomyopathy     a. 05/2011 Echo: EF 45-50%, inf/post HK  . H. pylori infection 03/01/12    treated with prevpac  . Tubular adenoma   . Hyperplastic polyp of intestine    Past Surgical History  Procedure Laterality Date  . Hemicolectomy  2003  . Appendectomy    . Colonoscopy  04/2001    Dr. Laural Golden-  colon carcinoma  . Esophagogastroduodenoscopy  04/2001    Dr. Laural Golden  . Esophagogastroduodenoscopy  08/05/10    Dr. Evalee Mutton ring, hiatal hernia  . Colonoscopy  09/04/09    Dr. Matthew Folks, hemorrhoids  . Atherectomy  07/17/11  . Coronary angioplasty  07/17/11    rotablator  . Colon surgery    . Cataract extraction, bilateral    . Esophagogastroduodenoscopy  03/01/2012    Dr. Gala Romney- gastric erosions, small hiatal hernia, +hpylori,=treated with prevpac  . Colonoscopy  03/11/2012    Dr. Sabino Gasser R hemicolectomy, pancolonic diverticulosis, tubular adenoma, hyperplastic polyp    Current medications; K-Dur 20 milliequivalents daily, Plavix 75 mg daily, Spiriva one capsule daily, Prevacid 30 mg daily, Lasix 20 twice a day, ferrous sulfate 325 twice a day, Coreg 325 twice a day, Zocor 40 mg daily Restoril 15 mg each bedtime Ambien 5 mg each bedtime when necessary meclizine 25 3 times a day when necessary, onglyza 5 mg daily, Glucotrol XL 2.5 daily  Lab work; CBC of 12.2 slightly macrocytic with an MCV of 100. Platelet count is normal white count and differential count normal. Comprehensive metabolic panel is normal. LDL 47 HDL 36  hemoglobin A1c of 6.2  Review of systems Skin; the patient is concerned about a hyperkeratotic nodule which is noticed in the last 2-3 weeks and has been increasing in size over the left temporal parietal area of his scalp. He has other lesions that look like seborrheic wart Respiratory he states he has been coughing for a month but without excessive shortness of breath Cardiac no chest pain GI no nausea vomiting or abdominal pain no further diarrhea Musculoskeletal complains of severe pains in both knees and weaknesses and in his legs making it difficult for him to walk  Physical examination Gen. the patient appears well, he is cognitively intact Respiratory; shallow but otherwise clear entry Cardiac heart sounds are normal there is absolutely no signs of  heart failure Abdomen; this is distended but no liver no spleen no tenderness and no masses Musculoskeletal; the patient can bring himself to a standing position and he is able to walk with a walker  Impression/plan #1 congestive heart failure this would appear to be well compensated on current medication #2 anemia listed as being a combination of iron deficiency and chronic disease although his MCV is 100.  #3 hyperlipidemia his LDL is well controlled #4 type 2 diabetes. He may have neuropathy his hemoglobin A1c is 6.2, I have therefore gone ahead and stopped his on onglyza. #5 significant osteoarthritis of both knees #6 hyperkeratotic area on his left temporal parietal scalp. Patient states he this is growing over the last 2 weeks and will have this seen by dermatology #7 COPD he is on Spiriva at this point I think his COPD is stable. He may have had a virus that caused a prolonged cough although his lungs are reasonably clear work of breathing is normal. Ultrasound to followup on this issue with the patient

## 2013-04-29 ENCOUNTER — Encounter: Payer: Self-pay | Admitting: Internal Medicine

## 2013-04-29 ENCOUNTER — Ambulatory Visit (INDEPENDENT_AMBULATORY_CARE_PROVIDER_SITE_OTHER): Payer: Medicare Other | Admitting: Internal Medicine

## 2013-04-29 VITALS — BP 143/70 | HR 87 | Temp 97.4°F | Wt 207.8 lb

## 2013-04-29 DIAGNOSIS — K5289 Other specified noninfective gastroenteritis and colitis: Secondary | ICD-10-CM

## 2013-04-29 DIAGNOSIS — K529 Noninfective gastroenteritis and colitis, unspecified: Secondary | ICD-10-CM

## 2013-04-29 DIAGNOSIS — R197 Diarrhea, unspecified: Secondary | ICD-10-CM

## 2013-04-29 DIAGNOSIS — R1314 Dysphagia, pharyngoesophageal phase: Secondary | ICD-10-CM

## 2013-04-29 DIAGNOSIS — K824 Cholesterolosis of gallbladder: Secondary | ICD-10-CM

## 2013-04-29 NOTE — Progress Notes (Signed)
Primary Care Physician:  Lanette Hampshire, MD Primary Gastroenterologist:  Dr. Gala Romney  Pre-Procedure History & Physical: HPI:  Howard [name] is a 80 y.o. male here for followup of and diarrhea-history of pseudomembranous colitis last fall and chronic microscopic colitis. States diarrhea overall improved.  He has about one bowel movement a day sometimes its quite loose, however. Takes Imodium after stool; no blood per rectum. History of colonic adenoma-removed.Marland Kitchen He does not have multiple stools. Denies episodes of incontinence. No reflux symptoms now not on any acid suppression medication. He does complain of chronic dyspnea somewhat worsened recently. He wonders when he to see the cardiologist next. He has occasional difficulty swallowing one of his larger capsules but overall not having any  dysphagia to pills/solid food otherwise.  Past Medical History  Diagnosis Date  . Hypertension   . Hyperlipidemia   . Adenocarcinoma 2003    COLON  . PVD (peripheral vascular disease)     ABI 0.78 RIGHT AND 0.80 LEFT 4/11  . Osteoarthritis     OF KNEES  . Kidney stone   . Schatzki's ring 08/05/10    egd with Dr. Gala Romney  . Hiatal hernia 08/05/10    egd with Dr. Gala Romney  . Esophageal dysmotility   . Microscopic colitis   . Internal hemorrhoids   . Diverticula of colon   . CHF (congestive heart failure)   . Coronary artery stenosis     a. 06/2011 Cath: LM 20, LAD min irregs, D1 95 - small,  LCX 60p, RCA 57m, 50d, PLA 50ost;  b. 06/2011 PCI/Rota  RCA  -> 3.0x34mm Promus Element DES  . Angina   . COPD (chronic obstructive pulmonary disease)   . Asthma   . Pneumonia ~ 2005; 09/2005  . Shortness of breath 07/17/11    "laying down"  . Type II diabetes mellitus   . GERD (gastroesophageal reflux disease)   . Anxiety   . Ischemic cardiomyopathy     a. 05/2011 Echo: EF 45-50%, inf/post HK  . H. pylori infection 03/01/12    treated with prevpac  . Tubular adenoma   . Hyperplastic polyp of intestine     Past  Surgical History  Procedure Laterality Date  . Hemicolectomy  2003  . Appendectomy    . Colonoscopy  04/2001    Dr. Laural Golden- colon carcinoma  . Esophagogastroduodenoscopy  04/2001    Dr. Laural Golden  . Esophagogastroduodenoscopy  08/05/10    Dr. Evalee Mutton ring, hiatal hernia  . Colonoscopy  09/04/09    Dr. Matthew Folks, hemorrhoids  . Atherectomy  07/17/11  . Coronary angioplasty  07/17/11    rotablator  . Colon surgery    . Cataract extraction, bilateral    . Esophagogastroduodenoscopy  03/01/2012    Dr. Gala Romney- gastric erosions, small hiatal hernia, +hpylori,=treated with prevpac  . Colonoscopy  03/11/2012    Dr. Sabino Gasser R hemicolectomy, pancolonic diverticulosis, tubular adenoma, hyperplastic polyp    Prior to Admission medications   Medication Sig Start Date End Date Taking? Authorizing Provider  albuterol (PROAIR HFA) 108 (90 BASE) MCG/ACT inhaler Inhale 2 puffs into the lungs every 4 (four) hours as needed. For shortness of breath   Yes Historical Provider, MD  carvedilol (COREG) 3.125 MG tablet Take 3.125 mg by mouth 2 (two) times daily with a meal.  04/28/13  Yes Historical Provider, MD  Cinnamon 500 MG TABS Take 500 mg by mouth daily.   Yes Historical Provider, MD  clopidogrel (PLAVIX) 75 MG tablet Take 1 tablet (75  mg total) by mouth daily. 12/16/11  Yes Burnell Blanks, MD  furosemide (LASIX) 20 MG tablet Take 20 mg by mouth 2 (two) times daily.    Yes Historical Provider, MD  GLIPIZIDE XL 5 MG 24 hr tablet Take 2.5 mg by mouth daily.  01/13/12  Yes Historical Provider, MD  lansoprazole (PREVACID) 30 MG capsule Take 1 capsule (30 mg total) by mouth daily before breakfast. 11/16/12  Yes Mahala Menghini, PA-C  LORazepam (ATIVAN) 0.5 MG tablet Take 0.5 mg by mouth every 4 (four) hours as needed.  01/24/13  Yes Historical Provider, MD  meclizine (ANTIVERT) 25 MG tablet Take 12.5 mg by mouth 3 (three) times daily as needed.    Yes Historical Provider, MD  phenol (CHLORASEPTIC  MOUTH PAIN) 1.4 % LIQD Use as directed 1 spray in the mouth or throat as needed for throat irritation / pain.   Yes Historical Provider, MD  potassium chloride SA (K-DUR,KLOR-CON) 20 MEQ tablet Take 20 mEq by mouth daily.    Yes Historical Provider, MD  simvastatin (ZOCOR) 40 MG tablet Take 40 mg by mouth every evening.   Yes Historical Provider, MD  temazepam (RESTORIL) 15 MG capsule Take 15 mg by mouth at bedtime.  04/04/13  Yes Historical Provider, MD  tiotropium (SPIRIVA) 18 MCG inhalation capsule Place 18 mcg into inhaler and inhale daily.   Yes Historical Provider, MD    Allergies as of 04/29/2013  . (No Known Allergies)    Family History  Problem Relation Age of Onset  . Coronary artery disease Father   . Coronary artery disease Brother     CABG  . Diabetes Brother   . Coronary artery disease Brother   . Heart Problems Mother     History   Social History  . Marital Status: Married    Spouse Name: N/A    Number of Children: N/A  . Years of Education: N/A   Occupational History  . Not on file.   Social History Main Topics  . Smoking status: Former Smoker -- 20 years    Types: Cigarettes    Quit date: 02/25/2008  . Smokeless tobacco: Never Used  . Alcohol Use: No  . Drug Use: No  . Sexual Activity: Not Currently   Other Topics Concern  . Not on file   Social History Narrative   Lives in [place]. Married. Wife in [place].   Son lives with him   Caffeine Use-    Review of Systems: See HPI, otherwise negative ROS  Physical Exam: BP 143/70  Pulse 87  Temp(Src) 97.4 F (36.3 C) (Oral)  Wt 207 lb 12.8 oz (94.257 kg) General:   Alert,  frail gentleman in no acute distress. He is confined to a wheelchair.  Skin: No jaundice Eyes:  Sclera clear, no icterus.   Conjunctiva pink. Ears:  Normal auditory acuity. Nose:  No deformity, discharge,  or lesions. Mouth:  No deformity or lesions. Neck:  Supple; no masses or thyromegaly. No significant cervical  adenopathy. Lungs:  Clear throughout to auscultation.   No wheezes, crackles, or rhonchi. No acute distress. Heart:  Regular rate and rhythm; no murmurs, clicks, rubs,  or gallops. Abdomen: Obese. Positive bowel sounds soft nontender without obvious mass or hepatomegaly lenomegaly.  Pulses:  Normal pulses noted. Extremities:  2+ lower extremity / pretibial edema.  Impression:   80 year old gentleman with a history of microscopic colitis with superimposed pseudomembranous colitis doing okay these days. He has no more than one  bowel movement a day although sometimes it is quite loose. Vague intermittent pill esophageal dysphagia likely secondary to dysmotility. GERD symptoms not an issue these days. He has mild dyspnea lower extremity edema. He is not in acute distress today. History of colonic adenoma (distant history of colon cancer).  History of gallbladder polyp  Recommendations:  I'd like to keep his GI regimen as simple as possible. I think he would do best with giving him a single Imodium each morning before breakfast as a scheduled medication. If he gets constipated, we're to be notified.  He is tentatively scheduled to have another colonoscopy in 2019, however, we may well forgo this surveillance examination given his advanced age and multiple comorbidities.  Will check an ultrasound of his gallbladder to followup of the previously noted polyp in a little less than 2 years from now.  He is to see the cardiologist on April 3.     Unless new GI issues come up, we'll plan to see this nice gentleman back in 6 months.

## 2013-04-29 NOTE — Patient Instructions (Addendum)
Take one Imodium capsule each morning before breakfast (everyday  As a scheduled medication  Call me is you don't have a bowel movement in 2 days  We will find out about your cardiology appointment  Office visit in 6 months

## 2013-05-04 ENCOUNTER — Non-Acute Institutional Stay (SKILLED_NURSING_FACILITY): Payer: Medicare Other | Admitting: Internal Medicine

## 2013-05-04 DIAGNOSIS — I2589 Other forms of chronic ischemic heart disease: Secondary | ICD-10-CM

## 2013-05-04 DIAGNOSIS — J449 Chronic obstructive pulmonary disease, unspecified: Secondary | ICD-10-CM

## 2013-05-04 DIAGNOSIS — I255 Ischemic cardiomyopathy: Secondary | ICD-10-CM

## 2013-05-09 NOTE — Progress Notes (Signed)
Patient ID: Howard [name], male   DOB: 1929/10/07, 80 y.o.   MRN: [id number]                  PROGRESS NOTE  DATE:  05/04/2013     FACILITY: [facility]    LEVEL OF CARE:   SNF   Acute Visit   CHIEF COMPLAINT:  Shortness of breath.    HISTORY OF PRESENT ILLNESS:  Mr. Oien is a gentleman for whom I recently assumed his care.  He has a history of coronary artery disease, status post stent, followed by Dr. Lovena Le in [place].    He also has a history of COPD and he recently saw Dr. Luan Pulling and had a steroid/LABA added, as well as a beta agonist p.r.n.       He basically describes shortness of breath.  It is not really clear that this is exertional.   In fact, he states that he is mostly short of breath when he lies down in bed at night, prompting him to have to raise the head of the bed.  He does not really describe exertional chest pain, although he clearly has some degree of discomfort at variable times in his chest.  However, it is difficult for me to be certain about what brings this on.    PAST MEDICAL HISTORY/PROBLEM LIST:  Past medical history is extensive and reviewed.    Last echocardiogram was in April 2013 at which time he had an ejection fraction of 45-50%.  He has inferior/posterior hypokinesis.    COPD.  I did not actually see pulmonary function tests.    Coronary artery stenosis.  His last catheterization was in May 2013.  I believe he had a stent to his right coronary artery.    Followed for recurrent diarrhea.  I believe he has had a diagnosis of microscopic colitis.  He also has a known hiatal hernia and Schatzki's ring.    PHYSICAL EXAMINATION:   VITAL SIGNS:   PULSE:  70.   RESPIRATIONS:  18 and unlabored.   CHEST/RESPIRATORY:  Reasonably clear air entry bilaterally.   CARDIOVASCULAR:  CARDIAC:   Heart sounds are distant.  There are no murmurs.  JVP is not elevated.   EDEMA/VARICOSITIES:  He has scant coccyx and scant lower extremity edema.     GASTROINTESTINAL:  ABDOMEN:   Very distended.  However, bowel sounds are positive.  There are no masses.  He is not tender.  There is no shifting dullness.   CIRCULATION:  EDEMA/VARICOSITIES:  Extremities:  Minimal edema.  No evidence of a DVT.    ASSESSMENT/PLAN:  Shortness of breath on exertion.  He gave the same history to Dr. Luan Pulling who augmented his inhalers.  His lungs sound really clear.  I have trouble necessarily assuming his shortness of breath is related to obstructive lung disease.    History of coronary artery disease with congestive heart failure.  He is on Lasix 20 mg twice a day.  The last BNP on his record is from 08/23/2012 at which time it was 5,890.0.  I may repeat this, along with his basic metabolic panel.  He had a recent chest x-ray done on 04/22/2013 that showed no acute pulmonary abnormality.  I am going to repeat his basic metabolic panel and BNP on him next week.  It would appear that baseline BNP is roughly in the 6,000 range.  I will see if anything is higher than this.    CPT CODE: 00867

## 2013-05-10 ENCOUNTER — Emergency Department (HOSPITAL_COMMUNITY)
Admission: EM | Admit: 2013-05-10 | Discharge: 2013-05-11 | Disposition: A | Discharge status: Home / Self Care | Payer: Medicare Other | Attending: Emergency Medicine | Admitting: Emergency Medicine

## 2013-05-10 ENCOUNTER — Emergency Department (HOSPITAL_COMMUNITY): Payer: Medicare Other

## 2013-05-10 ENCOUNTER — Encounter (HOSPITAL_COMMUNITY): Payer: Self-pay | Admitting: Emergency Medicine

## 2013-05-10 DIAGNOSIS — R141 Gas pain: Secondary | ICD-10-CM | POA: Insufficient documentation

## 2013-05-10 DIAGNOSIS — Z9861 Coronary angioplasty status: Secondary | ICD-10-CM | POA: Insufficient documentation

## 2013-05-10 DIAGNOSIS — Z860100 Personal history of colon polyps, unspecified: Secondary | ICD-10-CM | POA: Insufficient documentation

## 2013-05-10 DIAGNOSIS — Z8619 Personal history of other infectious and parasitic diseases: Secondary | ICD-10-CM | POA: Insufficient documentation

## 2013-05-10 DIAGNOSIS — E119 Type 2 diabetes mellitus without complications: Secondary | ICD-10-CM | POA: Diagnosis not present

## 2013-05-10 DIAGNOSIS — I10 Essential (primary) hypertension: Secondary | ICD-10-CM | POA: Diagnosis not present

## 2013-05-10 DIAGNOSIS — Z85038 Personal history of other malignant neoplasm of large intestine: Secondary | ICD-10-CM | POA: Insufficient documentation

## 2013-05-10 DIAGNOSIS — Z8701 Personal history of pneumonia (recurrent): Secondary | ICD-10-CM | POA: Diagnosis not present

## 2013-05-10 DIAGNOSIS — Z87738 Personal history of other specified (corrected) congenital malformations of digestive system: Secondary | ICD-10-CM | POA: Insufficient documentation

## 2013-05-10 DIAGNOSIS — I2510 Atherosclerotic heart disease of native coronary artery without angina pectoris: Secondary | ICD-10-CM | POA: Insufficient documentation

## 2013-05-10 DIAGNOSIS — R609 Edema, unspecified: Secondary | ICD-10-CM | POA: Diagnosis not present

## 2013-05-10 DIAGNOSIS — R0600 Dyspnea, unspecified: Secondary | ICD-10-CM

## 2013-05-10 DIAGNOSIS — I509 Heart failure, unspecified: Secondary | ICD-10-CM | POA: Diagnosis not present

## 2013-05-10 DIAGNOSIS — I209 Angina pectoris, unspecified: Secondary | ICD-10-CM | POA: Diagnosis not present

## 2013-05-10 DIAGNOSIS — Z8719 Personal history of other diseases of the digestive system: Secondary | ICD-10-CM | POA: Insufficient documentation

## 2013-05-10 DIAGNOSIS — R142 Eructation: Secondary | ICD-10-CM | POA: Diagnosis not present

## 2013-05-10 DIAGNOSIS — R0989 Other specified symptoms and signs involving the circulatory and respiratory systems: Secondary | ICD-10-CM | POA: Diagnosis not present

## 2013-05-10 DIAGNOSIS — R0609 Other forms of dyspnea: Secondary | ICD-10-CM | POA: Diagnosis not present

## 2013-05-10 DIAGNOSIS — J4489 Other specified chronic obstructive pulmonary disease: Secondary | ICD-10-CM | POA: Insufficient documentation

## 2013-05-10 DIAGNOSIS — Z87442 Personal history of urinary calculi: Secondary | ICD-10-CM | POA: Insufficient documentation

## 2013-05-10 DIAGNOSIS — Z8739 Personal history of other diseases of the musculoskeletal system and connective tissue: Secondary | ICD-10-CM | POA: Diagnosis not present

## 2013-05-10 DIAGNOSIS — Z7902 Long term (current) use of antithrombotics/antiplatelets: Secondary | ICD-10-CM | POA: Diagnosis not present

## 2013-05-10 DIAGNOSIS — Z87891 Personal history of nicotine dependence: Secondary | ICD-10-CM | POA: Insufficient documentation

## 2013-05-10 DIAGNOSIS — R143 Flatulence: Secondary | ICD-10-CM

## 2013-05-10 DIAGNOSIS — J449 Chronic obstructive pulmonary disease, unspecified: Secondary | ICD-10-CM | POA: Insufficient documentation

## 2013-05-10 DIAGNOSIS — Z79899 Other long term (current) drug therapy: Secondary | ICD-10-CM | POA: Diagnosis not present

## 2013-05-10 DIAGNOSIS — E785 Hyperlipidemia, unspecified: Secondary | ICD-10-CM | POA: Insufficient documentation

## 2013-05-10 DIAGNOSIS — R0602 Shortness of breath: Secondary | ICD-10-CM | POA: Diagnosis present

## 2013-05-10 LAB — CBC WITH DIFFERENTIAL/PLATELET
Basophils Absolute: 0 10*3/uL (ref 0.0–0.1)
Basophils Relative: 0 % (ref 0–1)
Eosinophils Absolute: 0.2 10*3/uL (ref 0.0–0.7)
Eosinophils Relative: 2 % (ref 0–5)
HCT: 33 % — ABNORMAL LOW (ref 39.0–52.0)
HEMOGLOBIN: 10.8 g/dL — AB (ref 13.0–17.0)
Lymphocytes Relative: 13 % (ref 12–46)
Lymphs Abs: 1.2 10*3/uL (ref 0.7–4.0)
MCH: 33.8 pg (ref 26.0–34.0)
MCHC: 32.7 g/dL (ref 30.0–36.0)
MCV: 103.1 fL — ABNORMAL HIGH (ref 78.0–100.0)
MONOS PCT: 14 % — AB (ref 3–12)
Monocytes Absolute: 1.3 10*3/uL — ABNORMAL HIGH (ref 0.1–1.0)
NEUTROS ABS: 6.4 10*3/uL (ref 1.7–7.7)
Neutrophils Relative %: 71 % (ref 43–77)
PLATELETS: 171 10*3/uL (ref 150–400)
RBC: 3.2 MIL/uL — ABNORMAL LOW (ref 4.22–5.81)
RDW: 15 % (ref 11.5–15.5)
WBC: 9.1 10*3/uL (ref 4.0–10.5)

## 2013-05-10 LAB — TROPONIN I: Troponin I: <0.3 ng/mL (ref <0.30)

## 2013-05-10 LAB — BASIC METABOLIC PANEL
BUN: 17 mg/dL (ref 6–23)
CHLORIDE: 99 meq/L (ref 96–112)
CO2: 30 mEq/L (ref 19–32)
Calcium: 8.5 mg/dL (ref 8.4–10.5)
Creatinine, Ser: 1.05 mg/dL (ref 0.50–1.35)
GFR calc non Af Amer: 64 mL/min — ABNORMAL LOW (ref >90)
GFR, EST AFRICAN AMERICAN: 74 mL/min — AB (ref >90)
Glucose, Bld: 113 mg/dL — ABNORMAL HIGH (ref 70–99)
POTASSIUM: 4.4 meq/L (ref 3.7–5.3)
Sodium: 139 mEq/L (ref 137–147)

## 2013-05-10 LAB — D-DIMER, QUANTITATIVE (NOT AT ARMC): D DIMER QUANT: 1.21 ug{FEU}/mL — AB (ref 0.00–0.48)

## 2013-05-10 MED ORDER — IOHEXOL 350 MG/ML SOLN
100.0000 mL | Freq: Once | INTRAVENOUS | Status: AC | PRN
  Administered 2013-05-10: 100 mL via INTRAVENOUS

## 2013-05-10 NOTE — Discharge Instructions (Signed)
CT scan of chest shows no blood clot, fluid, pneumonia.    Recommend nasal oxygen at 2-4 L per minute.    Recommend  "Gas X" to reduce gas in stomach.  Followup your primary care Dr.

## 2013-05-10 NOTE — ED Notes (Signed)
EDP IN WITH PT.

## 2013-05-10 NOTE — ED Provider Notes (Signed)
CSN: [id number]     Arrival date & time 05/10/13  1745 History  This chart was scribed for Nat Christen, MD by Jenne Campus, ED Scribe. This patient was seen in room APA18/APA18 and the patient's care was started at 6:03 PM.   Chief Complaint  Patient presents with  . Shortness of Breath     The history is provided by the patient and a relative. No language interpreter was used.    HPI Comments: Howard [name] is a 80 y.o. male with a h/o CHF and COPD brought in by ambulance from [facility], who presents to the Emergency Department complaining of gradual onset, gradually worsening SOB for the past 2 weeks. Per family, pt's O2 stats dropped down to 78%. He was placed on 3L [place] with improvement to 93%. Pt reports that the SOB has improved since being on the [place] O2. He states that his sxs are similar to prior CHF exacerbations. He denies chest pain, fever, chills.  Severity is moderate  PCP is [facility]  Past Medical History  Diagnosis Date  . Hypertension   . Hyperlipidemia   . Adenocarcinoma 2003    COLON  . PVD (peripheral vascular disease)     ABI 0.78 RIGHT AND 0.80 LEFT 4/11  . Osteoarthritis     OF KNEES  . Kidney stone   . Schatzki's ring 08/05/10    egd with Dr. Gala Romney  . Hiatal hernia 08/05/10    egd with Dr. Gala Romney  . Esophageal dysmotility   . Microscopic colitis   . Internal hemorrhoids   . Diverticula of colon   . CHF (congestive heart failure)   . Coronary artery stenosis     a. 06/2011 Cath: LM 20, LAD min irregs, D1 95 - small,  LCX 60p, RCA 57m, 50d, PLA 50ost;  b. 06/2011 PCI/Rota  RCA  -> 3.0x27mm Promus Element DES  . Angina   . COPD (chronic obstructive pulmonary disease)   . Asthma   . Pneumonia ~ 2005; 09/2005  . Shortness of breath 07/17/11    "laying down"  . Type II diabetes mellitus   . GERD (gastroesophageal reflux disease)   . Anxiety   . Ischemic cardiomyopathy     a. 05/2011 Echo: EF 45-50%, inf/post HK  . H. pylori infection 03/01/12    treated  with prevpac  . Tubular adenoma   . Hyperplastic polyp of intestine    Past Surgical History  Procedure Laterality Date  . Hemicolectomy  2003  . Appendectomy    . Colonoscopy  04/2001    Dr. Laural Golden- colon carcinoma  . Esophagogastroduodenoscopy  04/2001    Dr. Laural Golden  . Esophagogastroduodenoscopy  08/05/10    Dr. Evalee Mutton ring, hiatal hernia  . Colonoscopy  09/04/09    Dr. Matthew Folks, hemorrhoids  . Atherectomy  07/17/11  . Coronary angioplasty  07/17/11    rotablator  . Colon surgery    . Cataract extraction, bilateral    . Esophagogastroduodenoscopy  03/01/2012    Dr. Gala Romney- gastric erosions, small hiatal hernia, +hpylori,=treated with prevpac  . Colonoscopy  03/11/2012    Dr. Sabino Gasser R hemicolectomy, pancolonic diverticulosis, tubular adenoma, hyperplastic polyp   Family History  Problem Relation Age of Onset  . Coronary artery disease Father   . Coronary artery disease Brother     CABG  . Diabetes Brother   . Coronary artery disease Brother   . Heart Problems Mother    History  Substance Use Topics  . Smoking  status: Former Smoker -- 20 years    Types: Cigarettes    Quit date: 02/25/2008  . Smokeless tobacco: Never Used  . Alcohol Use: No    Review of Systems  A complete 10 system review of systems was obtained and all systems are negative except as noted in the HPI and PMH.    Allergies  Review of patient's allergies indicates no known allergies.  Home Medications   Current Outpatient Rx  Name  Route  Sig  Dispense  Refill  . albuterol (PROAIR HFA) 108 (90 BASE) MCG/ACT inhaler   Inhalation   Inhale 2 puffs into the lungs every 4 (four) hours as needed. For shortness of breath         . carvedilol (COREG) 3.125 MG tablet   Oral   Take 3.125 mg by mouth 2 (two) times daily with a meal.          . Cinnamon 500 MG TABS   Oral   Take 500 mg by mouth daily.         . clopidogrel (PLAVIX) 75 MG tablet   Oral   Take 1 tablet (75 mg  total) by mouth daily.   90 tablet   3   . ferrous sulfate 325 (65 FE) MG tablet   Oral   Take 325 mg by mouth 2 (two) times daily.         . furosemide (LASIX) 20 MG tablet   Oral   Take 20 mg by mouth 2 (two) times daily.          Marland Kitchen GLIPIZIDE XL 5 MG 24 hr tablet   Oral   Take 2.5 mg by mouth daily.          . lansoprazole (PREVACID) 30 MG capsule   Oral   Take 1 capsule (30 mg total) by mouth daily before breakfast.   30 capsule   11   . meclizine (ANTIVERT) 25 MG tablet   Oral   Take 12.5 mg by mouth 3 (three) times daily.          . potassium chloride SA (K-DUR,KLOR-CON) 20 MEQ tablet   Oral   Take 20 mEq by mouth daily.          Marland Kitchen Propylene Glycol (SYSTANE BALANCE) 0.6 % SOLN   Ophthalmic   Apply 1 drop to eye 2 (two) times daily.         . saxagliptin HCl (ONGLYZA) 5 MG TABS tablet   Oral   Take 5 mg by mouth daily.         . simvastatin (ZOCOR) 40 MG tablet   Oral   Take 40 mg by mouth every evening.         . tiotropium (SPIRIVA) 18 MCG inhalation capsule   Inhalation   Place 18 mcg into inhaler and inhale daily.         Marland Kitchen zolpidem (AMBIEN) 10 MG tablet   Oral   Take 10 mg by mouth at bedtime as needed for sleep.         Marland Kitchen LORazepam (ATIVAN) 0.5 MG tablet   Oral   Take 0.5 mg by mouth every 4 (four) hours as needed.           Triage Vitals: BP 153/69  Pulse 80  Temp(Src) 98.1 F (36.7 C) (Oral)  Ht 5\' 9"  (1.753 m)  Wt 214 lb (97.07 kg)  BMI 31.59 kg/m2  SpO2 97%  Physical Exam  Nursing note  and vitals reviewed. Constitutional: He is oriented to person, place, and time. He appears well-developed and well-nourished.  HENT:  Head: Normocephalic and atraumatic.  Eyes: Conjunctivae and EOM are normal. Pupils are equal, round, and reactive to light.  Neck: Normal range of motion. Neck supple.  Cardiovascular: Normal rate, regular rhythm and normal heart sounds.   Pulmonary/Chest: Effort normal and breath sounds normal.   Abdominal: Bowel sounds are normal. He exhibits distension. There is no tenderness.  Musculoskeletal: Normal range of motion. He exhibits edema (1+ BLE pitting edema ).  Neurological: He is alert and oriented to person, place, and time.  Skin: Skin is warm and dry.  Psychiatric: He has a normal mood and affect. His behavior is normal.    ED Course  Procedures (including critical care time)  DIAGNOSTIC STUDIES: Oxygen Saturation is 97% on 4L [place], adequate by my interpretation.    COORDINATION OF CARE: 6:06 PM-Discussed treatment plan which includes CXR, CBC panel, BMP and troponin with pt at bedside and pt agreed to plan.   Labs Review Labs Reviewed  CBC WITH DIFFERENTIAL - Abnormal; Notable for the following:    RBC 3.20 (*)    Hemoglobin 10.8 (*)    HCT 33.0 (*)    MCV 103.1 (*)    Monocytes Relative 14 (*)    Monocytes Absolute 1.3 (*)    All other components within normal limits  BASIC METABOLIC PANEL - Abnormal; Notable for the following:    Glucose, Bld 113 (*)    GFR calc non Af Amer 64 (*)    GFR calc Af Amer 74 (*)    All other components within normal limits  D-DIMER, QUANTITATIVE - Abnormal; Notable for the following:    D-Dimer, Quant 1.21 (*)    All other components within normal limits  TROPONIN I   Imaging Review Ct Angio Chest Pe W/cm &/or Wo Cm  05/10/2013   CLINICAL DATA:  Shortness of breath for 2 weeks, oxygen desaturation, pinning edema in length, question pulmonary embolism, history hypertension, coronary artery disease, diabetes, ischemic cardiomyopathy, COPD, asthma, carcinoma of the colon  EXAM: CT ANGIOGRAPHY CHEST WITH CONTRAST  TECHNIQUE: Multidetector CT imaging of the chest was performed using the standard protocol during bolus administration of intravenous contrast. Multiplanar CT image reconstructions and MIPs were obtained to evaluate the vascular anatomy.  CONTRAST:  13mL OMNIPAQUE IOHEXOL 350 MG/ML SOLN  COMPARISON:  03/15/2011  FINDINGS:  Scattered atherosclerotic calcifications aorta, great vessels, and coronary arteries.  Aorta normal caliber without aneurysm or dissection.  Low-attenuation focus superiorly right lobe liver near dome 10 mm diameter image 193, stable.  Visualized upper abdomen otherwise unremarkable.  No thoracic adenopathy.  Pulmonary arteries patent.  No evidence of pulmonary embolism.  Linear subsegmental atelectasis right lower lobe.  Remaining lungs clear.  No pleural effusion or pneumothorax.  Mild diffuse sclerosis at left lateral aspect of T10 vertebral body, unchanged from previous exam and stable since a CT of 02/09/2009.  Review of the MIP images confirms the above findings.  IMPRESSION: No evidence of pulmonary embolism.  Subsegmental atelectasis right lower lobe.  Extensive atherosclerotic disease.  Stable mild sclerosis of T10 vertebral body since 2010.   Electronically Signed   By: Lavonia Dana M.D.   On: 05/10/2013 21:22   Dg Chest Port 1 View  05/10/2013   CLINICAL DATA:  sob  EXAM: PORTABLE CHEST - 1 VIEW  COMPARISON:  DG ESOPHAGUS dated 11/23/2012; DG CHEST 2 VIEW dated 05/09/2012  FINDINGS:  Low lung volumes. Cardiac silhouette is enlarged. Lungs clear. Osseous structures unremarkable.  IMPRESSION: No evidence of acute cardiopulmonary disease.   Electronically Signed   By: Margaree Mackintosh M.D.   On: 05/10/2013 18:32     EKG Interpretation   Date/Time:  Tuesday May 10 2013 18:05:56 EDT Ventricular Rate:  79 PR Interval:  204 QRS Duration: 84 QT Interval:  378 QTC Calculation: 433 R Axis:   2 Text Interpretation:  Normal sinus rhythm Abnormal QRS-T angle, consider  primary T wave abnormality Abnormal ECG When compared with ECG of  09-May-2012 14:10, Nonspecific T wave abnormality no longer evident in  Inferior leads Confirmed by Qusai Kem  MD, Olden Klauer ([id number]) on 05/10/2013 6:49:41  PM      MDM   Final diagnoses:  Dyspnea  CT chest shows no PE, PNA, CHF.   Will encourage supplemental nasal oxygen  and followup as an outpatient.  Patient is hemodynamically stable. I personally performed the services described in this documentation, which was scribed in my presence. The recorded information has been reviewed and is accurate.     Nat Christen, MD 05/10/13 [phone]

## 2013-05-10 NOTE — ED Notes (Addendum)
Pt with hx of CHF reports SOB x 2 days and abdominal cramping. Denies CP. No acute distress observed. VSS. 1+ BLE pitting edema. Lung sounds clear. Abdomen distended and firm.

## 2013-05-11 ENCOUNTER — Non-Acute Institutional Stay (SKILLED_NURSING_FACILITY): Payer: Medicare Other | Admitting: Internal Medicine

## 2013-05-11 DIAGNOSIS — I255 Ischemic cardiomyopathy: Secondary | ICD-10-CM

## 2013-05-11 DIAGNOSIS — R0902 Hypoxemia: Secondary | ICD-10-CM

## 2013-05-11 DIAGNOSIS — I2589 Other forms of chronic ischemic heart disease: Secondary | ICD-10-CM

## 2013-05-11 DIAGNOSIS — J449 Chronic obstructive pulmonary disease, unspecified: Secondary | ICD-10-CM

## 2013-05-18 ENCOUNTER — Non-Acute Institutional Stay (SKILLED_NURSING_FACILITY): Payer: Medicare Other | Admitting: Internal Medicine

## 2013-05-18 DIAGNOSIS — R109 Unspecified abdominal pain: Secondary | ICD-10-CM

## 2013-05-18 NOTE — Progress Notes (Signed)
Patient ID: Howard [name], male   DOB: 03-29-1929, 80 y.o.   MRN: [id number]                  PROGRESS NOTE  DATE:  05/11/2013    FACILITY: [facility]    LEVEL OF CARE:   SNF   Acute Visit   CHIEF COMPLAINT:  Hypoxemia.        HISTORY OF PRESENT ILLNESS:  I have been asked to see Howard [name] today again in follow-up for hypoxemia.   Apparently, he was discovered to have a pulse ox in the low 70s yesterday.  The patient was complaining of shortness of breath.  He was sent to [facility] ER.  There, he had a CT scan of the chest that was negative for infiltrates or evidence of pulmonary embolism.  Lab work showed an essentially normal basic metabolic panel.  CBC:  White count of 9.1, hemoglobin 10.8, platelet count of 171.  Note that his MCV was 103.1.  Differential count on the white count essentially normal.  He had a slightly elevated D-dimer at 1.21.  The patient was returned to the facility on 2-4 L of oxygen.  I am seeing him today in follow-up.    REVIEW OF SYSTEMS:   CHEST/RESPIRATORY:  The patient is complaining of exertional shortness of breath.   CARDIAC:   No clear complaints of chest pain.   GI:  No nausea,  vomiting or abdominal pain.    PHYSICAL EXAMINATION:   VITAL SIGNS:     PULSE:  80.  O2 SATURATIONS:  98% on 3 L.   RESPIRATIONS:  18 and unlabored.    GENERAL APPEARANCE:  The patient is dyspneic on exertion.    CHEST/RESPIRATORY:  Carries a diagnosis of COPD.   However, his air entry is fairly good, perhaps slightly reduced versus normal.   There is no prolongation of his expiratory phase.  No expiratory wheezing.  No accessory muscle use.  His work of breathing, at least with minimal activity, seems normal.   CARDIOVASCULAR:  CARDIAC:   Heart sounds are distant.  There are no murmurs.   GASTROINTESTINAL:  ABDOMEN:   Again, very distended.  However, there is no guarding, no rebound and no tenderness.   CIRCULATION:   EDEMA/VARICOSITIES:  Extremities:  No  evidence of a DVT.     ASSESSMENT/PLAN:     Hypoxemia.  His BNP that I ordered last week came back at 782.9, certainly not indicative of underlying congestive heart failure.  A CT scan of the chest has been done, rules out significant PE.  He does have obstructive lung disease.  However, I can appreciate no major wheezing.   The cause of this is really not clear.  I have re-reviewed his past medical history.  He does have a history of coronary artery disease with congestive heart failure, COPD.   He does not have underlying liver disease.  As mentioned, he recently went back to see Dr. Luan Pulling.  Other than managing the underlying COPD, he made some reference to chronic aspiration.  I will have Speech Therapy review this.    CPT CODE: 56213

## 2013-05-25 NOTE — Progress Notes (Signed)
Patient ID: Howard [name], male   DOB: October 29, 1929, 80 y.o.   MRN: [id number]                   PROGRESS NOTE  DATE:  05/18/2013    FACILITY: [facility]    LEVEL OF CARE:   SNF   Acute Visit   CHIEF COMPLAINT:  Abdominal distention.    HISTORY OF PRESENT ILLNESS:  Again, Howard [name] wanted to be seen today in follow-up from last week.    This week, he is complaining of significant abdominal distention although, looking back on my notes, this is not new.  I saw him last week for hypoxemia related, I think, to underlying COPD.    With regards to his abdomen, he tells me that he had an unremarkable endo/colon within the last five years.  I will try to check this.  He does carry the diagnosis of microscopic colitis and has recurrent diarrhea.    REVIEW OF SYSTEMS:   CHEST/RESPIRATORY:  He is not complaining of shortness of breath this week.   CARDIAC:   No chest pain.   GI:  Complaining of increasing abdominal distention.    PHYSICAL EXAMINATION:   VITAL SIGNS:   O2 SATURATIONS:  97% on 2 L.   RESPIRATIONS:  18 and unlabored.   GENERAL APPEARANCE:  The patient is not in any distress.   CHEST/RESPIRATORY:  Clear air entry bilaterally.   CARDIOVASCULAR:  CARDIAC:   Heart sounds are normal.  There are no murmurs.   GASTROINTESTINAL:  ABDOMEN:   Very distended.  However, bowel sounds are positive.  There are no masses, and he is nontender.  There is definitely no shifting dullness.    ASSESSMENT/PLAN:  Abdominal distention.  I will try to look back and see what his colonoscopy showed, if anything.   I will check his electrolytes.  I will get an x-ray of his abdomen, looking at the bowel gas pattern to exclude a distal obstruction, although I do not think this is going to be the case.    CPT CODE: 14970

## 2013-05-27 ENCOUNTER — Encounter: Payer: Self-pay | Admitting: Adult Health

## 2013-05-27 ENCOUNTER — Ambulatory Visit (INDEPENDENT_AMBULATORY_CARE_PROVIDER_SITE_OTHER): Payer: Medicare Other | Admitting: Adult Health

## 2013-05-27 VITALS — BP 132/64 | HR 85 | Ht 68.0 in | Wt 207.0 lb

## 2013-05-27 DIAGNOSIS — I2510 Atherosclerotic heart disease of native coronary artery without angina pectoris: Secondary | ICD-10-CM

## 2013-05-27 DIAGNOSIS — I255 Ischemic cardiomyopathy: Secondary | ICD-10-CM

## 2013-05-27 DIAGNOSIS — I2589 Other forms of chronic ischemic heart disease: Secondary | ICD-10-CM

## 2013-05-27 DIAGNOSIS — I10 Essential (primary) hypertension: Secondary | ICD-10-CM

## 2013-05-27 MED ORDER — TORSEMIDE 20 MG PO TABS: 40.0000 mg | ORAL_TABLET | Freq: Every day | ORAL | Status: AC

## 2013-05-27 NOTE — Patient Instructions (Addendum)
Your physician recommends that you schedule a follow-up appointment in: 2 weeks   Your physician has requested that you have an echocardiogram. Echocardiography is a painless test that uses sound waves to create images of your heart. It provides your doctor with information about the size and shape of your heart and how well your heart's chambers and valves are working. This procedure takes approximately one hour. There are no restrictions for this procedure.  Your physician recommends that you return for lab work. BMET  Stop Lasix Start Torsemide 40 mg daily

## 2013-05-27 NOTE — Progress Notes (Signed)
HPI Howard [name] is an 80 year old male patient of Dr. Crissie Sickles we are following for ongoing assessment and management of CAD, PCI of the right coronary artery in May 2013 with a drug-eluting stent.;hypertension, hyperlipidemia, with known history of COPD, peripheral vascular disease with most recent ABIs completed in April of 2011 not requiring intervention.   He was seen in the emergency room on 05/10/2013 for complaints of shortness of breath. He is a resident of [facility] facility chest x-ray was negative for CHF or acute cardiopulmonary disease. CT scan of the chest was negative for PE. He was increased to wear supplemental nasal oxygen, and continue treatment for COPD.   The patient comes today with continued complaints of shortness of breath, abdominal distention, and some mild lower extremity edema. The patient is also saying that he is unable to lie flat, but is using a hospital bed and is keeping his head elevated to assist. He is currently in assisted nursing facility, and is getting round-the-clock nebulizer treatments every 6 hours and remains on oxygen at all time.    He denies chest pain, but feels pressure in his chest all the time. He appears to the low sodium diet provided to him by the facility. He is basically wheelchair bound.  No Known Allergies  Current Outpatient Prescriptions  Medication Sig Dispense Refill  . albuterol (PROAIR HFA) 108 (90 BASE) MCG/ACT inhaler Inhale 2 puffs into the lungs every 4 (four) hours as needed. For shortness of breath      . carvedilol (COREG) 3.125 MG tablet Take 3.125 mg by mouth 2 (two) times daily with a meal.       . Cinnamon 500 MG TABS Take 500 mg by mouth daily.      . clopidogrel (PLAVIX) 75 MG tablet Take 1 tablet (75 mg total) by mouth daily.  90 tablet  3  . ferrous sulfate 325 (65 FE) MG tablet Take 325 mg by mouth 2 (two) times daily.      Marland Kitchen GLIPIZIDE XL 5 MG 24 hr tablet Take 2.5 mg by mouth daily.       .  lansoprazole (PREVACID) 30 MG capsule Take 1 capsule (30 mg total) by mouth daily before breakfast.  30 capsule  11  . LORazepam (ATIVAN) 0.5 MG tablet Take 0.5 mg by mouth every 4 (four) hours as needed.       . meclizine (ANTIVERT) 25 MG tablet Take 12.5 mg by mouth 3 (three) times daily.       . potassium chloride SA (K-DUR,KLOR-CON) 20 MEQ tablet Take 20 mEq by mouth daily.       Marland Kitchen Propylene Glycol (SYSTANE BALANCE) 0.6 % SOLN Apply 1 drop to eye 2 (two) times daily.      . saxagliptin HCl (ONGLYZA) 5 MG TABS tablet Take 5 mg by mouth daily.      . simvastatin (ZOCOR) 40 MG tablet Take 40 mg by mouth every evening.      . tiotropium (SPIRIVA) 18 MCG inhalation capsule Place 18 mcg into inhaler and inhale daily.      Marland Kitchen zolpidem (AMBIEN) 10 MG tablet Take 10 mg by mouth at bedtime as needed for sleep.      Marland Kitchen torsemide (DEMADEX) 20 MG tablet Take 2 tablets (40 mg total) by mouth daily.  60 tablet  6   No current facility-administered medications for this visit.    Past Medical History  Diagnosis Date  . Hypertension   .  Hyperlipidemia   . Adenocarcinoma 2003    COLON  . PVD (peripheral vascular disease)     ABI 0.78 RIGHT AND 0.80 LEFT 4/11  . Osteoarthritis     OF KNEES  . Kidney stone   . Schatzki's ring 08/05/10    egd with Dr. Gala Romney  . Hiatal hernia 08/05/10    egd with Dr. Gala Romney  . Esophageal dysmotility   . Microscopic colitis   . Internal hemorrhoids   . Diverticula of colon   . CHF (congestive heart failure)   . Coronary artery stenosis     a. 06/2011 Cath: LM 20, LAD min irregs, D1 95 - small,  LCX 60p, RCA 78m, 50d, PLA 50ost;  b. 06/2011 PCI/Rota  RCA  -> 3.0x39mm Promus Element DES  . Angina   . COPD (chronic obstructive pulmonary disease)   . Asthma   . Pneumonia ~ 2005; 09/2005  . Shortness of breath 07/17/11    "laying down"  . Type II diabetes mellitus   . GERD (gastroesophageal reflux disease)   . Anxiety   . Ischemic cardiomyopathy     a. 05/2011 Echo: EF  45-50%, inf/post HK  . H. pylori infection 03/01/12    treated with prevpac  . Tubular adenoma   . Hyperplastic polyp of intestine     Past Surgical History  Procedure Laterality Date  . Hemicolectomy  2003  . Appendectomy    . Colonoscopy  04/2001    Dr. Laural Golden- colon carcinoma  . Esophagogastroduodenoscopy  04/2001    Dr. Laural Golden  . Esophagogastroduodenoscopy  08/05/10    Dr. Evalee Mutton ring, hiatal hernia  . Colonoscopy  09/04/09    Dr. Matthew Folks, hemorrhoids  . Atherectomy  07/17/11  . Coronary angioplasty  07/17/11    rotablator  . Colon surgery    . Cataract extraction, bilateral    . Esophagogastroduodenoscopy  03/01/2012    Dr. Gala Romney- gastric erosions, small hiatal hernia, +hpylori,=treated with prevpac  . Colonoscopy  03/11/2012    Dr. Rourk-s/p R hemicolectomy, pancolonic diverticulosis, tubular adenoma, hyperplastic polyp    ROS:  Review of systems complete and found to be negative unless listed above  PHYSICAL EXAM BP 132/64  Pulse 85  Ht 5\' 8"  (1.727 m)  Wt 207 lb (93.895 kg)  BMI 31.48 kg/m2  SpO2 96% General: Well developed, well nourished, in no acute distress Head: Eyes PERRLA, No xanthomas.   Normal cephalic and atramatic  Lungs: Diminished in the bases, some crackles in the upper airway. He is wearing oxygen. Heart: HRRR S1 S2, without MRG.  Pulses are 2+ & equal.            No carotid bruit. No JVD.  No abdominal bruits. No femoral bruits. Abdomen: Bowel sounds are positive, abdomen distended, and non-tender without masses or                  Hernia's noted. Msk:  Back normal, normal gait. Normal strength and tone for age. Extremities: No clubbing, cyanosis 1+ bilateral pitting edema.  DP +1 Neuro: Alert and oriented X 3. Hard of hearing. Psych:  Good affect, responds appropriately     ASSESSMENT AND PLAN

## 2013-05-27 NOTE — Assessment & Plan Note (Signed)
The patient does have evidence of mild fluid overload. He is on Lasix 20 mg twice a day, we will change this to torsemide 40 mg daily for better bioavailability. He will followup in the office in 2 weeks for reevaluation of his status.I doubt he diastolic heart failure is significant, but he is requiring more oxygen and does have some and all distention.  Uncertain he probably has higher pulmonary pressures related to COPD. We will make sure that his fluid levels remain lower with increased dose of diuretic.

## 2013-05-27 NOTE — Progress Notes (Deleted)
Name: Howard [name]    DOB: Dec 17, 1929  Age: 80 y.o.  MR#: [id number]       PCP:  Lanette Hampshire, MD      Insurance: Payor: [employer] / Plan: BLUE MEDICARE / Product Type: *No Product type* /   CC:    Chief Complaint  Patient presents with  . Coronary Artery Disease  . Hypertension    VS Filed Vitals:   05/27/13 1352  BP: 132/64  Pulse: 85  Height: 5\' 8"  (1.727 m)  Weight: 207 lb (93.895 kg)  SpO2: 96%    Weights Current Weight  05/27/13 207 lb (93.895 kg)  05/10/13 214 lb (97.07 kg)  04/29/13 207 lb 12.8 oz (94.257 kg)    Blood Pressure  BP Readings from Last 3 Encounters:  05/27/13 132/64  05/10/13 155/69  04/29/13 143/70     Admit date:  (Not on file) Last encounter with RMR:  Visit date not found   Allergy Review of patient's allergies indicates no known allergies.  Current Outpatient Prescriptions  Medication Sig Dispense Refill  . albuterol (PROAIR HFA) 108 (90 BASE) MCG/ACT inhaler Inhale 2 puffs into the lungs every 4 (four) hours as needed. For shortness of breath      . carvedilol (COREG) 3.125 MG tablet Take 3.125 mg by mouth 2 (two) times daily with a meal.       . Cinnamon 500 MG TABS Take 500 mg by mouth daily.      . clopidogrel (PLAVIX) 75 MG tablet Take 1 tablet (75 mg total) by mouth daily.  90 tablet  3  . ferrous sulfate 325 (65 FE) MG tablet Take 325 mg by mouth 2 (two) times daily.      . furosemide (LASIX) 20 MG tablet Take 20 mg by mouth 2 (two) times daily.       Marland Kitchen GLIPIZIDE XL 5 MG 24 hr tablet Take 2.5 mg by mouth daily.       . lansoprazole (PREVACID) 30 MG capsule Take 1 capsule (30 mg total) by mouth daily before breakfast.  30 capsule  11  . LORazepam (ATIVAN) 0.5 MG tablet Take 0.5 mg by mouth every 4 (four) hours as needed.       . meclizine (ANTIVERT) 25 MG tablet Take 12.5 mg by mouth 3 (three) times daily.       . potassium chloride SA (K-DUR,KLOR-CON) 20 MEQ tablet Take 20 mEq by mouth daily.       Marland Kitchen  Propylene Glycol (SYSTANE BALANCE) 0.6 % SOLN Apply 1 drop to eye 2 (two) times daily.      . saxagliptin HCl (ONGLYZA) 5 MG TABS tablet Take 5 mg by mouth daily.      . simvastatin (ZOCOR) 40 MG tablet Take 40 mg by mouth every evening.      . tiotropium (SPIRIVA) 18 MCG inhalation capsule Place 18 mcg into inhaler and inhale daily.      Marland Kitchen zolpidem (AMBIEN) 10 MG tablet Take 10 mg by mouth at bedtime as needed for sleep.       No current facility-administered medications for this visit.    Discontinued Meds:   There are no discontinued medications.  Patient Active Problem List   Diagnosis Date Noted  . GERD (gastroesophageal reflux disease) 11/17/2012  . Fall at home 08/18/2012  . Generalized weakness 08/18/2012  . Dehydration 08/18/2012  . Melena 02/27/2012  . Midsternal chest pain 07/18/2011  . CAD (coronary artery  disease) 07/18/2011  . Hypertension   . Hyperlipidemia   . Ischemic cardiomyopathy   . Type II diabetes mellitus   . Microscopic colitis 07/04/2011  . Anemia 06/03/2011  . Bronchitis, chronic 06/02/2011  . Cardiomyopathy 08/26/2010  . Esophageal dysphagia 07/26/2010  . Diarrhea 07/26/2010  . COLITIS 10/23/2009  . NEOPLASM, MALIGNANT, COLON, ADENOCARCINOMA 08/13/2009  . HELICOBACTER PYLORI GASTRITIS, HX OF 08/13/2009  . Osteoarthrosis, unspecified whether generalized or localized, lower leg 05/30/2009    LABS    Component Value Date/Time   NA 139 05/10/2013 1817   NA 139 08/24/2012 0514   NA 137 08/23/2012 1617   K 4.4 05/10/2013 1817   K 4.5 11/08/2012   K 3.3 11/04/2012   K 4.2 08/24/2012 0514   K 4.0 08/23/2012 1617   CL 99 05/10/2013 1817   CL 104 08/24/2012 0514   CL 99 08/23/2012 1617   CO2 30 05/10/2013 1817   CO2 34* 08/24/2012 0514   CO2 34* 08/23/2012 1617   GLUCOSE 113* 05/10/2013 1817   GLUCOSE 110* 08/24/2012 0514   GLUCOSE 114* 08/23/2012 1617   BUN 17 05/10/2013 1817   BUN 23* 11/08/2012   BUN 41* 11/04/2012   BUN 17 09/01/2012   BUN 17 08/24/2012 0514    BUN 19 08/23/2012 1617   CREATININE 1.05 05/10/2013 1817   CREATININE 1.4* 11/08/2012   CREATININE 1.7* 11/04/2012   CREATININE 1.1 09/01/2012   CREATININE 0.85 08/24/2012 0514   CREATININE 0.97 08/23/2012 1617   CREATININE 0.85 07/07/2011 1419   CREATININE 0.99 09/02/2010 0950   CALCIUM 8.5 05/10/2013 1817   CALCIUM 7.8* 08/24/2012 0514   CALCIUM 7.7* 08/23/2012 1617   GFRNONAA 64* 05/10/2013 1817   GFRNONAA 80* 08/24/2012 0514   GFRNONAA 75* 08/23/2012 1617   GFRAA 74* 05/10/2013 1817   GFRAA >90 08/24/2012 0514   GFRAA 87* 08/23/2012 1617   CMP     Component Value Date/Time   NA 139 05/10/2013 1817   K 4.4 05/10/2013 1817   K 4.5 11/08/2012   CL 99 05/10/2013 1817   CO2 30 05/10/2013 1817   GLUCOSE 113* 05/10/2013 1817   BUN 17 05/10/2013 1817   BUN 23* 11/08/2012   CREATININE 1.05 05/10/2013 1817   CREATININE 1.4* 11/08/2012   CREATININE 0.85 07/07/2011 1419   CALCIUM 8.5 05/10/2013 1817   PROT 6.0 06/01/2011 1145   ALBUMIN 3.1* 06/01/2011 1145   AST 19 11/04/2012   AST 27 06/01/2011 1145   ALT 10 11/04/2012   ALKPHOS 83 11/04/2012   ALKPHOS 54 06/01/2011 1145   BILITOT 0.5 11/04/2012   BILITOT 0.3 06/01/2011 1145   GFRNONAA 64* 05/10/2013 1817   GFRAA 74* 05/10/2013 1817       Component Value Date/Time   WBC 9.1 05/10/2013 1817   WBC 6.3 11/04/2012   WBC 7.9 09/01/2012   HGB 10.8* 05/10/2013 1817   HGB 11.5 11/04/2012   HGB 8.7 09/01/2012   HGB 8.8* 08/23/2012 0502   HGB 9.3* 08/20/2012 0500   HCT 33.0* 05/10/2013 1817   HCT 35 11/04/2012   HCT 28 09/01/2012   HCT 26.8* 08/23/2012 0502   HCT 28.8* 08/20/2012 0500   MCV 103.1* 05/10/2013 1817   MCV 98.3 11/04/2012   MCV 101.0 09/01/2012   MCV 100.0 08/23/2012 0502   MCV 100.7* 08/20/2012 0500    Lipid Panel  No results found for this basename: chol, trig, hdl, cholhdl, vldl, ldlcalc    ABG No results found for this basename:  phart, pco2, pco2art, po2, po2art, hco3, tco2, acidbasedef, o2sat     No results found for this basename: TSH   BNP (last 3  results)  Recent Labs  08/18/12 2033 08/23/12 2054  PROBNP 6539.0* 5890.0*   Cardiac Panel (last 3 results) No results found for this basename: CKTOTAL, CKMB, TROPONINI, RELINDX,  in the last 72 hours  Iron/TIBC/Ferritin    Component Value Date/Time   IRON 12* 08/18/2012 0312   TIBC 214* 08/18/2012 0312   FERRITIN 105 08/18/2012 0312     EKG Orders placed during the hospital encounter of 05/10/13  . ED EKG  . ED EKG  . EKG 12-LEAD  . EKG 12-LEAD  . EKG     Prior Assessment and Plan Problem List as of 05/27/2013     Cardiovascular and Mediastinum   Cardiomyopathy   Last Assessment & Plan   07/07/2011 Office Visit Written 07/07/2011  3:58 PM by Lendon Colonel, NP     I have discussed the results of his stress test and echocardiogram with Mr. Capo. I have also gone over this case with Dr.Wall on site. It is determined that he will benefit from diagnostic cardiac cath for further evaluation of CAD with possible intervention. I have changed his metoprolol to Coreg 6.25mg  BID in the setting of significant systolic dysfunction of [id number]. Marland Kitchen He will continue ACE inhibitor and statin. He is advised of cardiac cath procedure along with risks and benefits to include, CVA, MI and death. He verbalizes understanding and is willing to proceed. A phone call is made to his son to explain the need to proceed with cardiac cath. He is scheduled for Wed, May 15,2013 with Dr. Haroldine Laws.     CAD (coronary artery disease)   Last Assessment & Plan   12/15/2012 Office Visit Written 12/15/2012 10:47 AM by Evans Lance, MD     The patient denies anginal symptoms. He has not had to take any nitroglycerin. He is fairly sedentary, and I encouraged him to increase his physical activity as tolerated.    Hypertension   Last Assessment & Plan   12/15/2012 Office Visit Written 12/15/2012 10:48 AM by Evans Lance, MD     Today's blood pressure is well controlled. He'll continue his current medical therapy     Ischemic cardiomyopathy     Respiratory   Bronchitis, chronic     Digestive   NEOPLASM, MALIGNANT, COLON, ADENOCARCINOMA   COLITIS   Last Assessment & Plan   12/20/2010 Office Visit Written 12/20/2010 10:29 AM by Daneil Dolin, MD     Overall diarrhea much better with a course of Entocort 6 mg daily. He does have occasional breakthrough diarrhea but this may be more durable bowel syndrome than anything else.  Recommendations: Advised patient to decrease his Entocort to 3 mg daily and continue this regimen for 3 months until he returns for followup appointment.  Should he have any breakthrough symptoms he is urged to utilize Imodium.    HELICOBACTER PYLORI GASTRITIS, HX OF   Esophageal dysphagia   Microscopic colitis   Melena   Last Assessment & Plan   02/26/2012 Office Visit Written 02/27/2012  2:27 PM by Orvil Feil, NP     80 year old male with new-onset melena, heme + stool. Drop in Hgb from 10.3 in July to now 8.9. Iron low at 21 but ferritin normal at 96. Episode of nausea, abdominal cramping, diarrhea last week now resolved, question self-limiting process and not likely related to  current issue. No epigastric pain noted, but he does feel weak. He is on Plavix, which can contribute to occult GI bleeding. Anemia appears multifactorial. He does have a remote hx of colonic adenocarcinoma, with last TCS in July 2011. Due for surveillance in 2016. At this point, hold off on lower GI evaluation as he has no evidence of hematochezia; likely upper GI source. However, with his hx, I did explain to the patient that he may need a colonoscopy sooner than 2016. Pt and son both agreeable.   Proceed with upper endoscopy in the near future with Dr. Gala Romney. The risks, benefits, and alternatives have been discussed in detail with patient. They have stated understanding and desire to proceed.      GERD (gastroesophageal reflux disease)   Last Assessment & Plan   11/16/2012 Office Visit Edited 11/17/2012   7:56 AM by Mahala Menghini, PA-C     C/O refractory heartburn worse with meals. C/o N/V related to smell with some improvement over the past two weeks. He has solid food/pill esophageal dysphagia. Last EGD 02/2012. See PSH. Diarrhea improved, ?etiology.  1. BPE. 2. Abd u/s. 3. Change PPI to pantoprazole 30mg  daily. 4. Complete Vancomycin.       Endocrine   Type II diabetes mellitus     Musculoskeletal and Integument   Osteoarthrosis, unspecified whether generalized or localized, lower leg     Other   Diarrhea   Last Assessment & Plan   11/03/2012 Office Visit Written 11/03/2012  5:07 PM by Mahala Menghini, PA-C     Recurrent diarrhea associated with vomiting, poor oral intake. Discussed at length with Dr. Gala Romney. Patient does have some orthostasis but really has not had adequate outpatient trial of management. Discussed with patient, will try outpatient treatment for the next 24-48 hours but any time if he is unable to maintain hydration or does not respond to therapy, he will need to be hospitalized.  He has a history of microscopic colitis, IBS-D, but with history of C. difficile this would be our first concern. Not mentioned previously he does have history of adenocarcinoma of the colon, but colonoscopy is up to date.  Stat labs including CBC, CMET to be obtained. C.Diff PCR. Start oral vancomycin 125mg  QID for 14 days. OV in 2 weeks. We can decide on taper based on his response. Trial of Zofran.  We will touch base with patient once labs are available to see how he is doing.    Anemia   Last Assessment & Plan   11/03/2012 Office Visit Written 11/03/2012  5:03 PM by Mahala Menghini, PA-C     Stable chronic anemia. Likely anemia of chronic disease based on prior anemia profile. EGD/TCS up to date.  F/U on it today.    Midsternal chest pain   Hyperlipidemia   Last Assessment & Plan   04/12/2012 Office Visit Written 04/12/2012 11:02 AM by Evans Lance, MD     I've encouraged the patient  to lose weight, and continue his statin therapy.    Fall at home   Generalized weakness   Dehydration       Imaging: Ct Angio Chest Pe W/cm &/or Wo Cm  05/10/2013   CLINICAL DATA:  Shortness of breath for 2 weeks, oxygen desaturation, pinning edema in length, question pulmonary embolism, history hypertension, coronary artery disease, diabetes, ischemic cardiomyopathy, COPD, asthma, carcinoma of the colon  EXAM: CT ANGIOGRAPHY CHEST WITH CONTRAST  TECHNIQUE: Multidetector CT imaging of the chest was  performed using the standard protocol during bolus administration of intravenous contrast. Multiplanar CT image reconstructions and MIPs were obtained to evaluate the vascular anatomy.  CONTRAST:  151mL OMNIPAQUE IOHEXOL 350 MG/ML SOLN  COMPARISON:  03/15/2011  FINDINGS: Scattered atherosclerotic calcifications aorta, great vessels, and coronary arteries.  Aorta normal caliber without aneurysm or dissection.  Low-attenuation focus superiorly right lobe liver near dome 10 mm diameter image 193, stable.  Visualized upper abdomen otherwise unremarkable.  No thoracic adenopathy.  Pulmonary arteries patent.  No evidence of pulmonary embolism.  Linear subsegmental atelectasis right lower lobe.  Remaining lungs clear.  No pleural effusion or pneumothorax.  Mild diffuse sclerosis at left lateral aspect of T10 vertebral body, unchanged from previous exam and stable since a CT of 02/09/2009.  Review of the MIP images confirms the above findings.  IMPRESSION: No evidence of pulmonary embolism.  Subsegmental atelectasis right lower lobe.  Extensive atherosclerotic disease.  Stable mild sclerosis of T10 vertebral body since 2010.   Electronically Signed   By: Lavonia Dana M.D.   On: 05/10/2013 21:22   Dg Chest Port 1 View  05/10/2013   CLINICAL DATA:  sob  EXAM: PORTABLE CHEST - 1 VIEW  COMPARISON:  DG ESOPHAGUS dated 11/23/2012; DG CHEST 2 VIEW dated 05/09/2012  FINDINGS: Low lung volumes. Cardiac silhouette is enlarged.  Lungs clear. Osseous structures unremarkable.  IMPRESSION: No evidence of acute cardiopulmonary disease.   Electronically Signed   By: Margaree Mackintosh M.D.   On: 05/10/2013 18:32

## 2013-05-27 NOTE — Assessment & Plan Note (Signed)
He is without severe chest pain or recurrent symptoms of angina. The patient is very active, is wheelchair-bound. I will repeat his echocardiogram for changes in LV function with his worsening shortness of breath. This is most likely related to COPD but would reevaluate his status to at least adjust medication and management more effectively should his ejection fraction be decreased.  For now we will continue him on his current medication to include carvedilol 6.25 mg twice a day as he is not having episodes of wheezing.

## 2013-05-27 NOTE — Assessment & Plan Note (Signed)
Her pressure is very well-controlled currently. I would not make a changes concerning his blood pressure medication at this time.

## 2013-06-07 ENCOUNTER — Ambulatory Visit (HOSPITAL_COMMUNITY): Payer: Medicare Other

## 2013-06-08 ENCOUNTER — Ambulatory Visit (HOSPITAL_COMMUNITY)
Admission: RE | Admit: 2013-06-08 | Discharge: 2013-06-08 | Disposition: A | Discharge status: Home / Self Care | Payer: Medicare Other | Source: Ambulatory Visit | Attending: Adult Health | Admitting: Adult Health

## 2013-06-08 DIAGNOSIS — E119 Type 2 diabetes mellitus without complications: Secondary | ICD-10-CM | POA: Insufficient documentation

## 2013-06-08 DIAGNOSIS — I359 Nonrheumatic aortic valve disorder, unspecified: Secondary | ICD-10-CM | POA: Insufficient documentation

## 2013-06-08 DIAGNOSIS — I2510 Atherosclerotic heart disease of native coronary artery without angina pectoris: Secondary | ICD-10-CM | POA: Insufficient documentation

## 2013-06-08 DIAGNOSIS — I509 Heart failure, unspecified: Secondary | ICD-10-CM | POA: Insufficient documentation

## 2013-06-08 DIAGNOSIS — E785 Hyperlipidemia, unspecified: Secondary | ICD-10-CM | POA: Insufficient documentation

## 2013-06-08 DIAGNOSIS — I428 Other cardiomyopathies: Secondary | ICD-10-CM | POA: Insufficient documentation

## 2013-06-08 DIAGNOSIS — J449 Chronic obstructive pulmonary disease, unspecified: Secondary | ICD-10-CM | POA: Insufficient documentation

## 2013-06-08 DIAGNOSIS — I255 Ischemic cardiomyopathy: Secondary | ICD-10-CM

## 2013-06-08 DIAGNOSIS — I259 Chronic ischemic heart disease, unspecified: Secondary | ICD-10-CM | POA: Insufficient documentation

## 2013-06-08 DIAGNOSIS — J4489 Other specified chronic obstructive pulmonary disease: Secondary | ICD-10-CM | POA: Insufficient documentation

## 2013-06-08 DIAGNOSIS — I10 Essential (primary) hypertension: Secondary | ICD-10-CM | POA: Insufficient documentation

## 2013-06-08 NOTE — Progress Notes (Signed)
*  PRELIMINARY RESULTS* Echocardiogram 2D Echocardiogram has been performed.  Howard [name] 06/08/2013, 4:10 PM

## 2013-06-10 ENCOUNTER — Encounter: Payer: Self-pay | Admitting: Adult Health

## 2013-06-10 ENCOUNTER — Ambulatory Visit (INDEPENDENT_AMBULATORY_CARE_PROVIDER_SITE_OTHER): Payer: Medicare Other | Admitting: Adult Health

## 2013-06-10 VITALS — BP 137/54 | HR 79 | Wt 205.0 lb

## 2013-06-10 DIAGNOSIS — I429 Cardiomyopathy, unspecified: Secondary | ICD-10-CM

## 2013-06-10 DIAGNOSIS — D649 Anemia, unspecified: Secondary | ICD-10-CM

## 2013-06-10 DIAGNOSIS — I428 Other cardiomyopathies: Secondary | ICD-10-CM

## 2013-06-10 DIAGNOSIS — I10 Essential (primary) hypertension: Secondary | ICD-10-CM

## 2013-06-10 NOTE — Progress Notes (Deleted)
Name: Howard [name]    DOB: [birth year]  Age: 80 y.o.  MR#: [id number]       PCP:  Lanette Hampshire, MD      Insurance: Payor: [place] / Plan: BLUE MEDICARE / Product Type: *No Product type* /   CC:    Chief Complaint  Patient presents with  . Coronary Artery Disease  . Hypertension    VS Filed Vitals:   06/10/13 1301  BP: 137/54  Pulse: 79  Weight: 205 lb (92.987 kg)    Weights Current Weight  06/10/13 205 lb (92.987 kg)  05/27/13 207 lb (93.895 kg)  05/10/13 214 lb (97.07 kg)    Blood Pressure  BP Readings from Last 3 Encounters:  06/10/13 137/54  05/27/13 132/64  05/10/13 155/69     Admit date:  (Not on file) Last encounter with RMR:  05/27/2013   Allergy Review of patient's allergies indicates no known allergies.  Current Outpatient Prescriptions  Medication Sig Dispense Refill  . albuterol (PROAIR HFA) 108 (90 BASE) MCG/ACT inhaler Inhale 2 puffs into the lungs every 4 (four) hours as needed. For shortness of breath      . carvedilol (COREG) 3.125 MG tablet Take 3.125 mg by mouth 2 (two) times daily with a meal.       . Cinnamon 500 MG TABS Take 500 mg by mouth daily.      . clopidogrel (PLAVIX) 75 MG tablet Take 1 tablet (75 mg total) by mouth daily.  90 tablet  3  . ferrous sulfate 325 (65 FE) MG tablet Take 325 mg by mouth 2 (two) times daily.      Marland Kitchen GLIPIZIDE XL 5 MG 24 hr tablet Take 2.5 mg by mouth daily.       . lansoprazole (PREVACID) 30 MG capsule Take 1 capsule (30 mg total) by mouth daily before breakfast.  30 capsule  11  . LORazepam (ATIVAN) 0.5 MG tablet Take 0.5 mg by mouth every 4 (four) hours as needed.       . meclizine (ANTIVERT) 25 MG tablet Take 12.5 mg by mouth 3 (three) times daily.       . potassium chloride SA (K-DUR,KLOR-CON) 20 MEQ tablet Take 20 mEq by mouth daily.       Marland Kitchen Propylene Glycol (SYSTANE BALANCE) 0.6 % SOLN Apply 1 drop to eye 2 (two) times daily.      . saxagliptin HCl (ONGLYZA) 5 MG TABS tablet  Take 5 mg by mouth daily.      . simvastatin (ZOCOR) 40 MG tablet Take 40 mg by mouth every evening.      . tiotropium (SPIRIVA) 18 MCG inhalation capsule Place 18 mcg into inhaler and inhale daily.      Marland Kitchen torsemide (DEMADEX) 20 MG tablet Take 2 tablets (40 mg total) by mouth daily.  60 tablet  6  . zolpidem (AMBIEN) 10 MG tablet Take 10 mg by mouth at bedtime as needed for sleep.       No current facility-administered medications for this visit.    Discontinued Meds:   There are no discontinued medications.  Patient Active Problem List   Diagnosis Date Noted  . GERD (gastroesophageal reflux disease) 11/17/2012  . Fall at home 08/18/2012  . Generalized weakness 08/18/2012  . Dehydration 08/18/2012  . Melena 02/27/2012  . Midsternal chest pain 07/18/2011  . CAD (coronary artery disease) 07/18/2011  . Hypertension   . Hyperlipidemia   . Ischemic cardiomyopathy   .  Type II diabetes mellitus   . Microscopic colitis 07/04/2011  . Anemia 06/03/2011  . Bronchitis, chronic 06/02/2011  . Cardiomyopathy 08/26/2010  . Esophageal dysphagia 07/26/2010  . Diarrhea 07/26/2010  . COLITIS 10/23/2009  . NEOPLASM, MALIGNANT, COLON, ADENOCARCINOMA 08/13/2009  . HELICOBACTER PYLORI GASTRITIS, HX OF 08/13/2009  . Osteoarthrosis, unspecified whether generalized or localized, lower leg 05/30/2009    LABS    Component Value Date/Time   NA 139 05/10/2013 1817   NA 139 08/24/2012 0514   NA 137 08/23/2012 1617   K 4.4 05/10/2013 1817   K 4.5 11/08/2012   K 3.3 11/04/2012   K 4.2 08/24/2012 0514   K 4.0 08/23/2012 1617   CL 99 05/10/2013 1817   CL 104 08/24/2012 0514   CL 99 08/23/2012 1617   CO2 30 05/10/2013 1817   CO2 34* 08/24/2012 0514   CO2 34* 08/23/2012 1617   GLUCOSE 113* 05/10/2013 1817   GLUCOSE 110* 08/24/2012 0514   GLUCOSE 114* 08/23/2012 1617   BUN 17 05/10/2013 1817   BUN 23* 11/08/2012   BUN 41* 11/04/2012   BUN 17 09/01/2012   BUN 17 08/24/2012 0514   BUN 19 08/23/2012 1617   CREATININE 1.05  05/10/2013 1817   CREATININE 1.4* 11/08/2012   CREATININE 1.7* 11/04/2012   CREATININE 1.1 09/01/2012   CREATININE 0.85 08/24/2012 0514   CREATININE 0.97 08/23/2012 1617   CREATININE 0.85 07/07/2011 1419   CREATININE 0.99 09/02/2010 0950   CALCIUM 8.5 05/10/2013 1817   CALCIUM 7.8* 08/24/2012 0514   CALCIUM 7.7* 08/23/2012 1617   GFRNONAA 64* 05/10/2013 1817   GFRNONAA 80* 08/24/2012 0514   GFRNONAA 75* 08/23/2012 1617   GFRAA 74* 05/10/2013 1817   GFRAA >90 08/24/2012 0514   GFRAA 87* 08/23/2012 1617   CMP     Component Value Date/Time   NA 139 05/10/2013 1817   K 4.4 05/10/2013 1817   K 4.5 11/08/2012   CL 99 05/10/2013 1817   CO2 30 05/10/2013 1817   GLUCOSE 113* 05/10/2013 1817   BUN 17 05/10/2013 1817   BUN 23* 11/08/2012   CREATININE 1.05 05/10/2013 1817   CREATININE 1.4* 11/08/2012   CREATININE 0.85 07/07/2011 1419   CALCIUM 8.5 05/10/2013 1817   PROT 6.0 06/01/2011 1145   ALBUMIN 3.1* 06/01/2011 1145   AST 19 11/04/2012   AST 27 06/01/2011 1145   ALT 10 11/04/2012   ALKPHOS 83 11/04/2012   ALKPHOS 54 06/01/2011 1145   BILITOT 0.5 11/04/2012   BILITOT 0.3 06/01/2011 1145   GFRNONAA 64* 05/10/2013 1817   GFRAA 74* 05/10/2013 1817       Component Value Date/Time   WBC 9.1 05/10/2013 1817   WBC 6.3 11/04/2012   WBC 7.9 09/01/2012   HGB 10.8* 05/10/2013 1817   HGB 11.5 11/04/2012   HGB 8.7 09/01/2012   HGB 8.8* 08/23/2012 0502   HGB 9.3* 08/20/2012 0500   HCT 33.0* 05/10/2013 1817   HCT 35 11/04/2012   HCT 28 09/01/2012   HCT 26.8* 08/23/2012 0502   HCT 28.8* 08/20/2012 0500   MCV 103.1* 05/10/2013 1817   MCV 98.3 11/04/2012   MCV 101.0 09/01/2012   MCV 100.0 08/23/2012 0502   MCV 100.7* 08/20/2012 0500    Lipid Panel  No results found for this basename: chol, trig, hdl, cholhdl, vldl, ldlcalc    ABG No results found for this basename: phart, pco2, pco2art, po2, po2art, hco3, tco2, acidbasedef, o2sat     No results found for  this basename: TSH   BNP (last 3 results)  Recent Labs  08/18/12 2033  08/23/12 2054  PROBNP 6539.0* 5890.0*   Cardiac Panel (last 3 results) No results found for this basename: CKTOTAL, CKMB, TROPONINI, RELINDX,  in the last 72 hours  Iron/TIBC/Ferritin    Component Value Date/Time   IRON 12* 08/18/2012 0312   TIBC 214* 08/18/2012 0312   FERRITIN 105 08/18/2012 0312     EKG Orders placed during the hospital encounter of 05/10/13  . ED EKG  . ED EKG  . EKG 12-LEAD  . EKG 12-LEAD  . EKG     Prior Assessment and Plan Problem List as of 06/10/2013     Cardiovascular and Mediastinum   Cardiomyopathy   Last Assessment & Plan   07/07/2011 Office Visit Written 07/07/2011  3:58 PM by Lendon Colonel, NP     I have discussed the results of his stress test and echocardiogram with Mr. Mich. I have also gone over this case with Dr.Wall on site. It is determined that he will benefit from diagnostic cardiac cath for further evaluation of CAD with possible intervention. I have changed his metoprolol to Coreg 6.25mg  BID in the setting of significant systolic dysfunction of 02%. Marland Kitchen He will continue ACE inhibitor and statin. He is advised of cardiac cath procedure along with risks and benefits to include, CVA, MI and death. He verbalizes understanding and is willing to proceed. A phone call is made to his son to explain the need to proceed with cardiac cath. He is scheduled for Wed, May 15,2013 with Dr. Haroldine Laws.     CAD (coronary artery disease)   Last Assessment & Plan   05/27/2013 Office Visit Written 05/27/2013  3:12 PM by Lendon Colonel, NP     He is without severe chest pain or recurrent symptoms of angina. The patient is very active, is wheelchair-bound. I will repeat his echocardiogram for changes in LV function with his worsening shortness of breath. This is most likely related to COPD but would reevaluate his status to at least adjust medication and management more effectively should his ejection fraction be decreased.  For now we will continue him on his  current medication to include carvedilol 6.25 mg twice a day as he is not having episodes of wheezing.    Hypertension   Last Assessment & Plan   05/27/2013 Office Visit Written 05/27/2013  3:13 PM by Lendon Colonel, NP     Her pressure is very well-controlled currently. I would not make a changes concerning his blood pressure medication at this time.    Ischemic cardiomyopathy   Last Assessment & Plan   05/27/2013 Office Visit Written 05/27/2013  3:15 PM by Lendon Colonel, NP     The patient does have evidence of mild fluid overload. He is on Lasix 20 mg twice a day, we will change this to torsemide 40 mg daily for better bioavailability. He will followup in the office in 2 weeks for reevaluation of his status.I doubt he diastolic heart failure is significant, but he is requiring more oxygen and does have some and all distention.  Uncertain he probably has higher pulmonary pressures related to COPD. We will make sure that his fluid levels remain lower with increased dose of diuretic.      Respiratory   Bronchitis, chronic     Digestive   NEOPLASM, MALIGNANT, COLON, ADENOCARCINOMA   COLITIS   Last Assessment & Plan   12/20/2010 Office Visit  Written 12/20/2010 10:29 AM by Daneil Dolin, MD     Overall diarrhea much better with a course of Entocort 6 mg daily. He does have occasional breakthrough diarrhea but this may be more durable bowel syndrome than anything else.  Recommendations: Advised patient to decrease his Entocort to 3 mg daily and continue this regimen for 3 months until he returns for followup appointment.  Should he have any breakthrough symptoms he is urged to utilize Imodium.    HELICOBACTER PYLORI GASTRITIS, HX OF   Esophageal dysphagia   Microscopic colitis   Melena   Last Assessment & Plan   02/26/2012 Office Visit Written 02/27/2012  2:27 PM by Orvil Feil, NP     80 year old male with new-onset melena, heme + stool. Drop in Hgb from 10.3 in July to now 8.9. Iron  low at 21 but ferritin normal at 96. Episode of nausea, abdominal cramping, diarrhea last week now resolved, question self-limiting process and not likely related to current issue. No epigastric pain noted, but he does feel weak. He is on Plavix, which can contribute to occult GI bleeding. Anemia appears multifactorial. He does have a remote hx of colonic adenocarcinoma, with last TCS in July 2011. Due for surveillance in 2016. At this point, hold off on lower GI evaluation as he has no evidence of hematochezia; likely upper GI source. However, with his hx, I did explain to the patient that he may need a colonoscopy sooner than 2016. Pt and son both agreeable.   Proceed with upper endoscopy in the near future with Dr. Gala Romney. The risks, benefits, and alternatives have been discussed in detail with patient. They have stated understanding and desire to proceed.      GERD (gastroesophageal reflux disease)   Last Assessment & Plan   11/16/2012 Office Visit Edited 11/17/2012  7:56 AM by Mahala Menghini, PA-C     C/O refractory heartburn worse with meals. C/o N/V related to smell with some improvement over the past two weeks. He has solid food/pill esophageal dysphagia. Last EGD 02/2012. See PSH. Diarrhea improved, ?etiology.  1. BPE. 2. Abd u/s. 3. Change PPI to pantoprazole 30mg  daily. 4. Complete Vancomycin.       Endocrine   Type II diabetes mellitus     Musculoskeletal and Integument   Osteoarthrosis, unspecified whether generalized or localized, lower leg     Other   Diarrhea   Last Assessment & Plan   11/03/2012 Office Visit Written 11/03/2012  5:07 PM by Mahala Menghini, PA-C     Recurrent diarrhea associated with vomiting, poor oral intake. Discussed at length with Dr. Gala Romney. Patient does have some orthostasis but really has not had adequate outpatient trial of management. Discussed with patient, will try outpatient treatment for the next 24-48 hours but any time if he is unable to maintain  hydration or does not respond to therapy, he will need to be hospitalized.  He has a history of microscopic colitis, IBS-D, but with history of C. difficile this would be our first concern. Not mentioned previously he does have history of adenocarcinoma of the colon, but colonoscopy is up to date.  Stat labs including CBC, CMET to be obtained. C.Diff PCR. Start oral vancomycin 125mg  QID for 14 days. OV in 2 weeks. We can decide on taper based on his response. Trial of Zofran.  We will touch base with patient once labs are available to see how he is doing.    Anemia   Last  Assessment & Plan   11/03/2012 Office Visit Written 11/03/2012  5:03 PM by Mahala Menghini, PA-C     Stable chronic anemia. Likely anemia of chronic disease based on prior anemia profile. EGD/TCS up to date.  F/U on it today.    Midsternal chest pain   Hyperlipidemia   Last Assessment & Plan   04/12/2012 Office Visit Written 04/12/2012 11:02 AM by Evans Lance, MD     I've encouraged the patient to lose weight, and continue his statin therapy.    Fall at home   Generalized weakness   Dehydration       Imaging: No results found.

## 2013-06-10 NOTE — Patient Instructions (Addendum)
Your physician recommends that you schedule a follow-up appointment in: 6 months with Dr Taylor You will receive a reminder letter two months in advance reminding you to call and schedule your appointment. If you don't receive this letter, please contact our office.  Your physician recommends that you continue on your current medications as directed. Please refer to the Current Medication list given to you today.   

## 2013-06-10 NOTE — Progress Notes (Signed)
HPI: [name] is an 80 year old patient Dr. Crissie Sickles who was last seen in the office on 05/27/2013, for ongoing assessment and management of CAD, PCI of the right coronary artery in May 2013 with DES, hypertension, hyperlipidemia, with known history of COPD and PND.   On last office visits patient denied chest pain but felt pressure in his chest all the time, he is in a wheelchair predominantly. He was found to have some mild evidence of fluid overload. His Lasix was changed to torsemide 40 mg daily from 20 mg of Lasix twice a day. Cardiogram was repeated for reevaluation of LV function to assist with medical management.  Echocardiogram completed on 06/08/2013 revealed a normal LV chamber size with mild LVH with an EF of 60-65%. His found to have mid to basal inferior lateral hypokinesis. There was an improvement in his LVEF compared to prior study. He continued with grade 1 diastolic dysfunction. With some mitral regurg.  He comes today feeling much better and breathing better Wt is down. He is still having difficulty with low salt diet, especially as son brings him hot dogs and fast food when he visits.   No Known Allergies  Current Outpatient Prescriptions  Medication Sig Dispense Refill  . albuterol (PROAIR HFA) 108 (90 BASE) MCG/ACT inhaler Inhale 2 puffs into the lungs every 4 (four) hours as needed. For shortness of breath      . carvedilol (COREG) 3.125 MG tablet Take 3.125 mg by mouth 2 (two) times daily with a meal.       . Cinnamon 500 MG TABS Take 500 mg by mouth daily.      . clopidogrel (PLAVIX) 75 MG tablet Take 1 tablet (75 mg total) by mouth daily.  90 tablet  3  . ferrous sulfate 325 (65 FE) MG tablet Take 325 mg by mouth 2 (two) times daily.      Marland Kitchen GLIPIZIDE XL 5 MG 24 hr tablet Take 2.5 mg by mouth daily.       . lansoprazole (PREVACID) 30 MG capsule Take 1 capsule (30 mg total) by mouth daily before breakfast.  30 capsule  11  . LORazepam (ATIVAN) 0.5 MG tablet Take  0.5 mg by mouth every 4 (four) hours as needed.       . meclizine (ANTIVERT) 25 MG tablet Take 12.5 mg by mouth 3 (three) times daily.       . potassium chloride SA (K-DUR,KLOR-CON) 20 MEQ tablet Take 20 mEq by mouth daily.       Marland Kitchen Propylene Glycol (SYSTANE BALANCE) 0.6 % SOLN Apply 1 drop to eye 2 (two) times daily.      . saxagliptin HCl (ONGLYZA) 5 MG TABS tablet Take 5 mg by mouth daily.      . simvastatin (ZOCOR) 40 MG tablet Take 40 mg by mouth every evening.      . tiotropium (SPIRIVA) 18 MCG inhalation capsule Place 18 mcg into inhaler and inhale daily.      Marland Kitchen torsemide (DEMADEX) 20 MG tablet Take 2 tablets (40 mg total) by mouth daily.  60 tablet  6  . zolpidem (AMBIEN) 10 MG tablet Take 10 mg by mouth at bedtime as needed for sleep.       No current facility-administered medications for this visit.    Past Medical History  Diagnosis Date  . Hypertension   . Hyperlipidemia   . Adenocarcinoma 2003    COLON  . PVD (peripheral vascular disease)  ABI 0.78 RIGHT AND 0.80 LEFT 4/11  . Osteoarthritis     OF KNEES  . Kidney stone   . Schatzki's ring 08/05/10    egd with Dr. Gala Romney  . Hiatal hernia 08/05/10    egd with Dr. Gala Romney  . Esophageal dysmotility   . Microscopic colitis   . Internal hemorrhoids   . Diverticula of colon   . CHF (congestive heart failure)   . Coronary artery stenosis     a. 06/2011 Cath: LM 20, LAD min irregs, D1 95 - small,  LCX 60p, RCA 34m, 50d, PLA 50ost;  b. 06/2011 PCI/Rota  RCA  -> 3.0x27mm Promus Element DES  . Angina   . COPD (chronic obstructive pulmonary disease)   . Asthma   . Pneumonia ~ 2005; 09/2005  . Shortness of breath 07/17/11    "laying down"  . Type II diabetes mellitus   . GERD (gastroesophageal reflux disease)   . Anxiety   . Ischemic cardiomyopathy     a. 05/2011 Echo: EF 45-50%, inf/post HK  . H. pylori infection 03/01/12    treated with prevpac  . Tubular adenoma   . Hyperplastic polyp of intestine     Past Surgical History   Procedure Laterality Date  . Hemicolectomy  2003  . Appendectomy    . Colonoscopy  04/2001    Dr. Laural Golden- colon carcinoma  . Esophagogastroduodenoscopy  04/2001    Dr. Laural Golden  . Esophagogastroduodenoscopy  08/05/10    Dr. Evalee Mutton ring, hiatal hernia  . Colonoscopy  09/04/09    Dr. Matthew Folks, hemorrhoids  . Atherectomy  07/17/11  . Coronary angioplasty  07/17/11    rotablator  . Colon surgery    . Cataract extraction, bilateral    . Esophagogastroduodenoscopy  03/01/2012    Dr. Gala Romney- gastric erosions, small hiatal hernia, +hpylori,=treated with prevpac  . Colonoscopy  03/11/2012    Dr. Rourk-s/p R hemicolectomy, pancolonic diverticulosis, tubular adenoma, hyperplastic polyp    ROS: Review of systems complete and found to be negative unless listed above  PHYSICAL EXAM BP 137/54  Pulse 79  Wt 205 lb (92.987 kg) General: Well developed, well nourished, in no acute distress Head: Eyes PERRLA, No xanthomas.   Normal cephalic and atramatic  Lungs: Clear bilaterally to auscultation and percussion. Heart: HRRR S1 S2, with 1/6 systolic murmur.  Pulses are 2+ & equal.            No carotid bruit. No JVD.  No abdominal bruits. No femoral bruits. Abdomen: Bowel sounds are positive, abdomen soft and non-tender, obese without masses or Hernia's noted. Msk:  Back normal, normal gait. Normal strength and tone for age. Extremities: No clubbing, cyanosis or edema.  DP +1 Neuro: Alert and oriented X 3. Hard of hearing. Psych:  Good affect, responds appropriately   ASSESSMENT AND PLAN

## 2013-06-10 NOTE — Assessment & Plan Note (Signed)
BP is currently well controlled. No changes in medication regimen.

## 2013-06-10 NOTE — Assessment & Plan Note (Signed)
Improvement in overall status with recent echo demonstrating EF of 60-65%. BP is normal. He has responded well to the torsemide change from lasix. He is to continue low sodium diet. Son is advised not to bring outside food.

## 2013-06-10 NOTE — Assessment & Plan Note (Signed)
Review of labs demonstrates he is stable. Ongoing assessment by PCP.

## 2013-06-15 ENCOUNTER — Non-Acute Institutional Stay (SKILLED_NURSING_FACILITY): Payer: Medicare Other | Admitting: Internal Medicine

## 2013-06-15 DIAGNOSIS — I5041 Acute combined systolic (congestive) and diastolic (congestive) heart failure: Secondary | ICD-10-CM

## 2013-06-15 DIAGNOSIS — K625 Hemorrhage of anus and rectum: Secondary | ICD-10-CM

## 2013-06-15 DIAGNOSIS — I509 Heart failure, unspecified: Secondary | ICD-10-CM

## 2013-06-18 ENCOUNTER — Non-Acute Institutional Stay (SKILLED_NURSING_FACILITY): Payer: Medicare Other | Admitting: Internal Medicine

## 2013-06-18 DIAGNOSIS — K625 Hemorrhage of anus and rectum: Secondary | ICD-10-CM

## 2013-06-20 NOTE — Progress Notes (Addendum)
Patient ID: Howard [name], male   DOB: [birth year], 80 y.o.   MRN: [id number]                  PROGRESS NOTE  DATE:  06/15/2013    FACILITY: [facility]    LEVEL OF CARE:   SNF   Acute Visit   CHIEF COMPLAINT:  Bleeding in his brief.    HISTORY OF PRESENT ILLNESS:  I was asked to look at Howard [name] today with regards to hemorrhoidal bleeding.   Apparently, blood was found in his brief, although I have very little information on this.  The patient states he was lying in bed and noticed it on the material.  He is not complaining of dysuria or perirectal pain.     With regards to other medical issues, he has been to see Cardiology.  He was changed from 40 of Lasix to 40 of Demadex.  The reasons behind this are not totally clear.  An echocardiogram and BMET were ordered.    He has also been to ENT for cerumen removal in the left ear.    He saw GI, Dr. Gala Romney, on 05/08/2013.  He was given Imodium, again for reasons that are not completely clear.  He already has a colonic ileus documented clinically and by x-ray.  The exact logic of the Imodium escapes me, although the nurses think he has Irritable bowel.    REVIEW OF SYSTEMS:   GI:  He is not complaining of abdominal pain, nausea or vomiting, hematemesis.   CHEST/RESPIRATORY:  States his breathing is better.    GU:  He is not complaining of hematuria or dysuria.    PHYSICAL EXAMINATION:   VITAL SIGNS:   O2 SATURATIONS:  96% with his oxygen on.   CHEST/RESPIRATORY:  Totally clear air entry bilaterally.   CARDIOVASCULAR:  CARDIAC:   Heart sounds are normal.  JVP is not elevated.   EDEMA/VARICOSITIES: He has 1-2+ edema of both legs.  No evidence of a DVT.   GASTROINTESTINAL:  ABDOMEN:   Distended.  No masses.  Bowel sounds are active.   LIVER/SPLEEN/KIDNEYS:  No liver, no spleen.   RECTAL:  Scant amount of OB-negative stool.   GENITOURINARY:   PROSTATE:  Feels normal.    ASSESSMENT/PLAN:  ?Rectal bleeding, or could this have  been bleeding in urine?  I will do guaiacs on him.  Also, send a urine for urinalysis.  At this point, I am not sure that this actually represents rectal bleeding.    Listed as having systolic and diastolic heart failure.  He  apparently had a repeat echocardiogram done which he said was "better".    COPD.  Follows with Pulmonology, although his lungs sound completely clear.  I would wonder if he has had PFTs.

## 2013-06-21 NOTE — Progress Notes (Addendum)
Patient ID: Howard [name], male   DOB: 09/29/1929, 80 y.o.   MRN: [id number]                  PROGRESS NOTE  DATE:  06/18/2013    FACILITY: [facility]    LEVEL OF CARE:   SNF   Acute Visit   CHIEF COMPLAINT:  Follow up rectal bleeding.    HISTORY OF PRESENT ILLNESS:  I saw this man earlier in the week at which point he  apparently had blood staining in his briefs.  I did an exam on him.  He was reasonably stable.  Rectal examination was negative for blood.  I was not even convinced that this was rectal source.  However, guaiacs that I ordered have come back positive 2/3 times.  I am reviewing him today because of this.    I note that he has been followed for a long period of time by Howard [name].  He really has had an elaborate GI history which includes a right hemicolectomy, pancolonic diverticulosis, tubular adenoma, hyperplastic polyp, and microscopic colitis.  He also has a history of pseudomembranous colitis.    His last colonoscopy was in January 2014.  At the same time, he had a gastroduodenoscopy which showed gastric erosions, small hiatal hernia, and positive H.pylori treated with a Prevpac at that time.    Finally, he appears to have hemorrhoids on a previous colonoscopy in 2011.    LABORATORY DATA:  Lab work on 06/03/2013 showed a normal basic metabolic panel.    On 05/20/2013, his hemoglobin was 11.  This was 11.5 in September 2014.    REVIEW OF SYSTEMS:   CHEST/RESPIRATORY:  The patient is not complaining of shortness of breath.   CARDIAC:   No clear chest pain.   GI:  He complains of abdominal distention, altered bowel habits.  An x-ray I did in the facility showed a colonic ileus.  Therefore, I actually stopped the Imodium that Howard [name] had put him on last month.    PHYSICAL EXAMINATION:   VITAL SIGNS:  Vitals are stable.   GENERAL APPEARANCE:  The patient is not in any distress.   CARDIOVASCULAR:  CARDIAC:   Heart sounds are normal.  There are no signs of  congestive heart failure.   GASTROINTESTINAL:  ABDOMEN:   Still distended, but soft.  Bowel sounds are positive.    ASSESSMENT/PLAN:  Rectal bleeding.  This was a scant amount.  I will follow his hemoglobin within the next week or so.  I will try to see if I can leave a message with Howard [name] as to whether he feels anything further needs to be done in terms of GI imaging.  I somehow doubt it.

## 2013-08-02 ENCOUNTER — Ambulatory Visit: Payer: Medicare Other | Admitting: Gastroenterology

## 2013-08-02 ENCOUNTER — Ambulatory Visit (INDEPENDENT_AMBULATORY_CARE_PROVIDER_SITE_OTHER): Payer: Medicare Other | Admitting: Gastroenterology

## 2013-08-02 ENCOUNTER — Encounter: Payer: Self-pay | Admitting: Gastroenterology

## 2013-08-02 ENCOUNTER — Other Ambulatory Visit: Payer: Self-pay | Admitting: Internal Medicine

## 2013-08-02 VITALS — BP 110/63 | HR 81 | Temp 98.0°F | Resp 20 | Ht 67.0 in | Wt 206.2 lb

## 2013-08-02 DIAGNOSIS — R1319 Other dysphagia: Secondary | ICD-10-CM

## 2013-08-02 DIAGNOSIS — R1314 Dysphagia, pharyngoesophageal phase: Secondary | ICD-10-CM

## 2013-08-02 DIAGNOSIS — R1310 Dysphagia, unspecified: Secondary | ICD-10-CM

## 2013-08-02 NOTE — Patient Instructions (Signed)
We have scheduled you for an upper endoscopy and possible dilation with Dr. Gala Romney in the near future.  If you have further abdominal bloating or pain, let the facility know.

## 2013-08-03 ENCOUNTER — Encounter (HOSPITAL_COMMUNITY): Payer: Self-pay | Admitting: Pharmacy Technician

## 2013-08-03 ENCOUNTER — Telehealth: Payer: Self-pay | Admitting: Gastroenterology

## 2013-08-03 NOTE — Assessment & Plan Note (Signed)
Persistent and worsening dysphagia, likely secondary to known motility disorder but symptoms increasing in severity; unable to exclude esophageal web, ring, or stricture.   Proceed with upper endoscopy/dilation in the near future with Dr. Gala Romney. The risks, benefits, and alternatives have been discussed in detail with patient. They have stated understanding and desire to proceed.

## 2013-08-03 NOTE — Telephone Encounter (Signed)
[facility] called and needed information regarding EGD/ED. They didn't have the letter from our office for some reason; I wonder if the son has it?   Anyway, can you fax the letter to [phone] Thanks!

## 2013-08-03 NOTE — Telephone Encounter (Signed)
INSTRUCTIONS HAVE BEEN FAXED

## 2013-08-03 NOTE — Progress Notes (Signed)
Referring Provider: Lanette Hampshire, MD Primary Care Physician:  Lanette Hampshire, MD Primary GI: Dr. Gala Romney   Chief Complaint  Patient presents with  . Nausea    HPI:   Howard [name] presents today with acute on chronic dysphagia, feeling like his symptoms are worsening since last seen in March 2015. Last EGD in Jan 2014 with +H.pylori, treated with Prevpac. Known dysmotility on BPE in Oct 2014. States his food sits in his epigastric region, then throws it up. Difficulty with solids. No odynophagia.   Feels like chronic diarrhea is at baseline. Somewhat poor historian. From his description, appears to deal with intermittent constipation. Hemoccult positive a few months ago, with possible scant blood in underwear at one point. No other signs of rectal bleeding. Last colonoscopy in 2014 with tubular adenoma. With history of colon cancer in remote past, next surveillance due in 2019. Known history of internal hemorrhoids.   Past Medical History  Diagnosis Date  . Hypertension   . Hyperlipidemia   . Adenocarcinoma 2003    COLON  . PVD (peripheral vascular disease)     ABI 0.78 RIGHT AND 0.80 LEFT 4/11  . Osteoarthritis     OF KNEES  . Kidney stone   . Schatzki's ring 08/05/10    egd with Dr. Gala Romney  . Hiatal hernia 08/05/10    egd with Dr. Gala Romney  . Esophageal dysmotility   . Microscopic colitis   . Internal hemorrhoids   . Diverticula of colon   . CHF (congestive heart failure)   . Coronary artery stenosis     a. 06/2011 Cath: LM 20, LAD min irregs, D1 95 - small,  LCX 60p, RCA 22m, 50d, PLA 50ost;  b. 06/2011 PCI/Rota  RCA  -> 3.0x77mm Promus Element DES  . Angina   . COPD (chronic obstructive pulmonary disease)   . Asthma   . Pneumonia ~ 2005; 09/2005  . Shortness of breath 07/17/11    "laying down"  . Type II diabetes mellitus   . GERD (gastroesophageal reflux disease)   . Anxiety   . Ischemic cardiomyopathy     a. 05/2011 Echo: EF 45-50%, inf/post HK  . H. pylori  infection 03/01/12    treated with prevpac  . Tubular adenoma   . Hyperplastic polyp of intestine     Past Surgical History  Procedure Laterality Date  . Hemicolectomy  2003  . Appendectomy    . Colonoscopy  04/2001    Dr. Laural Golden- colon carcinoma  . Esophagogastroduodenoscopy  04/2001    Dr. Laural Golden  . Esophagogastroduodenoscopy  08/05/10    Dr. Evalee Mutton ring, hiatal hernia  . Colonoscopy  09/04/09    Dr. Matthew Folks, hemorrhoids  . Atherectomy  07/17/11  . Coronary angioplasty  07/17/11    rotablator  . Colon surgery    . Cataract extraction, bilateral    . Esophagogastroduodenoscopy  03/01/2012    Dr. Gala Romney- gastric erosions, small hiatal hernia, +hpylori,=treated with prevpac  . Colonoscopy  03/11/2012    Dr. Sabino Gasser R hemicolectomy, pancolonic diverticulosis, tubular adenoma, hyperplastic polyp    Current Outpatient Prescriptions  Medication Sig Dispense Refill  . albuterol (ACCUNEB) 1.25 MG/3ML nebulizer solution Take 1 ampule by nebulization 4 (four) times daily.      . carvedilol (COREG) 3.125 MG tablet Take 3.125 mg by mouth 2 (two) times daily with a meal.       . Cinnamon 500 MG TABS Take 500 mg by mouth daily.      Marland Kitchen  clopidogrel (PLAVIX) 75 MG tablet Take 1 tablet (75 mg total) by mouth daily.  90 tablet  3  . glipiZIDE (GLUCOTROL XL) 2.5 MG 24 hr tablet Take 2.5 mg by mouth daily with breakfast.      . lansoprazole (PREVACID) 30 MG capsule Take 1 capsule (30 mg total) by mouth daily before breakfast.  30 capsule  11  . loperamide (IMODIUM A-D) 2 MG tablet Take 2 mg by mouth daily as needed for diarrhea or loose stools.       Marland Kitchen LORazepam (ATIVAN) 0.5 MG tablet Take 0.5 mg by mouth every 4 (four) hours as needed.       . potassium chloride SA (K-DUR,KLOR-CON) 20 MEQ tablet Take 20 mEq by mouth daily.      Marland Kitchen Propylene Glycol (SYSTANE BALANCE) 0.6 % SOLN Apply 1 drop to eye 2 (two) times daily.      . simvastatin (ZOCOR) 40 MG tablet Take 40 mg by mouth every  evening.      . temazepam (RESTORIL) 15 MG capsule Take 15 mg by mouth at bedtime as needed for sleep.      Marland Kitchen torsemide (DEMADEX) 20 MG tablet Take 2 tablets (40 mg total) by mouth daily.  60 tablet  6  . traMADol (ULTRAM) 50 MG tablet Take 50 mg by mouth every 6 (six) hours as needed.      . ferrous sulfate 325 (65 FE) MG EC tablet Take 325 mg by mouth 2 (two) times daily with a meal.       No current facility-administered medications for this visit.    Allergies as of 08/02/2013  . (No Known Allergies)    Family History  Problem Relation Age of Onset  . Coronary artery disease Father   . Coronary artery disease Brother     CABG  . Diabetes Brother   . Coronary artery disease Brother   . Heart Problems Mother     History   Social History  . Marital Status: Married    Spouse Name: N/A    Number of Children: N/A  . Years of Education: N/A   Social History Main Topics  . Smoking status: Former Smoker -- 20 years    Types: Cigarettes    Quit date: 02/25/2008  . Smokeless tobacco: Never Used  . Alcohol Use: No  . Drug Use: No  . Sexual Activity: Not Currently   Other Topics Concern  . None   Social History Narrative   Lives in [place]. Married. Wife in [place].   Son lives with him   Caffeine Use-    Review of Systems: As mentioned in HPI.   Physical Exam: BP 110/63  Pulse 81  Temp(Src) 98 F (36.7 C) (Oral)  Resp 20  Ht 5\' 7"  (1.702 m)  Wt 206 lb 3.2 oz (93.532 kg)  BMI 32.29 kg/m2 General:   Alert and oriented. No distress noted. Pleasant and cooperative.  Head:  Normocephalic and atraumatic. Eyes:  Conjuctiva clear without scleral icterus. Heart:  S1, S2 present without murmur Abdomen:  Obese, protuberant, +BS, soft, non-tender, query possible ventral hernia Msk:  Symmetrical without gross deformities. Normal posture. Extremities:  1+ lower extremity edema Neurologic:  Alert and  oriented x4;  grossly normal neurologically. Skin:  Intact without  significant lesions or rashes. Psych:  Alert and cooperative. Normal mood and affect.

## 2013-08-09 NOTE — Progress Notes (Signed)
cc'd to pcp 

## 2013-08-11 ENCOUNTER — Encounter (HOSPITAL_COMMUNITY)
Admission: RE | Disposition: A | Discharge status: Home / Self Care | Payer: Self-pay | Source: Ambulatory Visit | Attending: Internal Medicine

## 2013-08-11 ENCOUNTER — Ambulatory Visit (HOSPITAL_COMMUNITY)
Admission: RE | Admit: 2013-08-11 | Discharge: 2013-08-11 | Disposition: A | Discharge status: Home / Self Care | Payer: Medicare Other | Source: Ambulatory Visit | Attending: Internal Medicine | Admitting: Internal Medicine

## 2013-08-11 DIAGNOSIS — Z860100 Personal history of colon polyps, unspecified: Secondary | ICD-10-CM | POA: Insufficient documentation

## 2013-08-11 DIAGNOSIS — E119 Type 2 diabetes mellitus without complications: Secondary | ICD-10-CM | POA: Insufficient documentation

## 2013-08-11 DIAGNOSIS — J4489 Other specified chronic obstructive pulmonary disease: Secondary | ICD-10-CM | POA: Insufficient documentation

## 2013-08-11 DIAGNOSIS — R1319 Other dysphagia: Secondary | ICD-10-CM

## 2013-08-11 DIAGNOSIS — I10 Essential (primary) hypertension: Secondary | ICD-10-CM | POA: Diagnosis not present

## 2013-08-11 DIAGNOSIS — R1314 Dysphagia, pharyngoesophageal phase: Secondary | ICD-10-CM | POA: Insufficient documentation

## 2013-08-11 DIAGNOSIS — E785 Hyperlipidemia, unspecified: Secondary | ICD-10-CM | POA: Diagnosis not present

## 2013-08-11 DIAGNOSIS — Z8701 Personal history of pneumonia (recurrent): Secondary | ICD-10-CM | POA: Diagnosis not present

## 2013-08-11 DIAGNOSIS — M1710 Unilateral primary osteoarthritis, unspecified knee: Secondary | ICD-10-CM | POA: Diagnosis not present

## 2013-08-11 DIAGNOSIS — K2970 Gastritis, unspecified, without bleeding: Secondary | ICD-10-CM

## 2013-08-11 DIAGNOSIS — Z79899 Other long term (current) drug therapy: Secondary | ICD-10-CM | POA: Insufficient documentation

## 2013-08-11 DIAGNOSIS — Z7902 Long term (current) use of antithrombotics/antiplatelets: Secondary | ICD-10-CM | POA: Insufficient documentation

## 2013-08-11 DIAGNOSIS — K222 Esophageal obstruction: Secondary | ICD-10-CM | POA: Insufficient documentation

## 2013-08-11 DIAGNOSIS — R197 Diarrhea, unspecified: Secondary | ICD-10-CM | POA: Diagnosis not present

## 2013-08-11 DIAGNOSIS — K219 Gastro-esophageal reflux disease without esophagitis: Secondary | ICD-10-CM | POA: Diagnosis not present

## 2013-08-11 DIAGNOSIS — Z87891 Personal history of nicotine dependence: Secondary | ICD-10-CM | POA: Diagnosis not present

## 2013-08-11 DIAGNOSIS — I2510 Atherosclerotic heart disease of native coronary artery without angina pectoris: Secondary | ICD-10-CM | POA: Insufficient documentation

## 2013-08-11 DIAGNOSIS — J449 Chronic obstructive pulmonary disease, unspecified: Secondary | ICD-10-CM | POA: Diagnosis not present

## 2013-08-11 DIAGNOSIS — K2990 Gastroduodenitis, unspecified, without bleeding: Secondary | ICD-10-CM

## 2013-08-11 DIAGNOSIS — Z85038 Personal history of other malignant neoplasm of large intestine: Secondary | ICD-10-CM | POA: Insufficient documentation

## 2013-08-11 HISTORY — PX: SAVORY DILATION: SHX5439

## 2013-08-11 HISTORY — PX: MALONEY DILATION: SHX5535

## 2013-08-11 HISTORY — PX: ESOPHAGOGASTRODUODENOSCOPY: SHX5428

## 2013-08-11 SURGERY — EGD (ESOPHAGOGASTRODUODENOSCOPY)
Anesthesia: Moderate Sedation

## 2013-08-11 MED ORDER — MEPERIDINE HCL 100 MG/ML IJ SOLN
INTRAMUSCULAR | Status: AC
  Filled 2013-08-11: qty 2

## 2013-08-11 MED ORDER — ONDANSETRON HCL 4 MG/2ML IJ SOLN
INTRAMUSCULAR | Status: DC
Start: 2013-08-11 — End: 2013-08-12
  Filled 2013-08-11: qty 2

## 2013-08-11 MED ORDER — MIDAZOLAM HCL 5 MG/5ML IJ SOLN
INTRAMUSCULAR | Status: DC
Start: 2013-08-11 — End: 2013-08-12
  Filled 2013-08-11: qty 10

## 2013-08-11 MED ORDER — LIDOCAINE VISCOUS 2 % MT SOLN
OROMUCOSAL | Status: AC
  Filled 2013-08-11: qty 15

## 2013-08-11 MED ORDER — MIDAZOLAM HCL 5 MG/5ML IJ SOLN
INTRAMUSCULAR | Status: DC | PRN
  Administered 2013-08-11 (×2): 1 mg via INTRAVENOUS

## 2013-08-11 MED ORDER — MEPERIDINE HCL 100 MG/ML IJ SOLN
INTRAMUSCULAR | Status: DC | PRN
  Administered 2013-08-11: 25 mg via INTRAVENOUS

## 2013-08-11 MED ORDER — LIDOCAINE VISCOUS 2 % MT SOLN
OROMUCOSAL | Status: DC | PRN
  Administered 2013-08-11: 3 mL via OROMUCOSAL

## 2013-08-11 MED ORDER — STERILE WATER FOR IRRIGATION IR SOLN
Status: DC | PRN
  Administered 2013-08-11: 13:00:00

## 2013-08-11 MED ORDER — ONDANSETRON HCL 4 MG/2ML IJ SOLN
INTRAMUSCULAR | Status: DC | PRN
  Administered 2013-08-11: 4 mg via INTRAVENOUS

## 2013-08-11 MED ORDER — SODIUM CHLORIDE 0.9 % IV SOLN: INTRAVENOUS | Status: DC

## 2013-08-11 NOTE — Op Note (Signed)
[facility] [address] [place], [address]   ENDOSCOPY PROCEDURE REPORT  PATIENT: Howard [name], Howard [name]  MR#: [id number] BIRTHDATE: Jan 10, 1930 , 84  yrs. old GENDER: Male ENDOSCOPIST: R.  Garfield Cornea, MD FACP [facility] REFERRED BY:  Linton Ham, M.D. PROCEDURE DATE:  08/11/2013 PROCEDURE:     EGD with Venia Minks dilation followed by gastric biopsy  INDICATIONS:    vague esophageal dysphagia  INFORMED CONSENT:   The risks, benefits, limitations, alternatives and imponderables have been discussed.  The potential for biopsy, esophogeal dilation, etc. have also been reviewed.  Questions have been answered.  All parties agreeable.  Please see the history and physical in the medical record for more information.  MEDICATIONS:   Versed 2 mg IV and Demerol 25 mg IV in divided doses. Xylocaine gel orally. Zofran 4 mg IV  DESCRIPTION OF PROCEDURE:   The EG-[id number] ([id number])  endoscope was introduced through the mouth and advanced to the second portion of the duodenum without difficulty or limitations.  The mucosal surfaces were surveyed very carefully during advancement of the scope and upon withdrawal.  Retroflexion view of the proximal stomach and esophagogastric junction was performed.      FINDINGS: Subtle distal Schatzki's ring; otherwise normal esophagus. Stomach with a fair amount of bile stained mucus and a minimal amount a few food; it was easily lavaged and suctioned out. Diffusely angry, erythematous gastric mucosa. No ulcer or infiltrating process observed. Patent pylorus. Examination bulb and second portion revealed no abnormalities.  THERAPEUTIC / DIAGNOSTIC MANEUVERS PERFORMED:  A 56 French Maloney dilator was passed to full insertion easily. A look back revealed the ring had been ruptured nicely without apparent complication and minimal bleeding. Subsequently, biopsies of abnormal gastric mucosa taken for histologic study.   COMPLICATIONS:   None  IMPRESSION: Subtle Schatzki's ring-status post dilation as described above. Some retained gastric contents-query delay in gastric emptying. Abnormal gastric mucosa-status post gastric biopsy  RECOMMENDATIONS:  Continue Prevacid 30 mg daily. Followup on pathology. If patient has persisting symptoms of regurgitation, we may need to evaluate gastric emptying.    _______________________________ R. Garfield Cornea, MD FACP [facility] eSigned:  R. Garfield Cornea, MD FACP [facility] 08/11/2013 1:41 PM     CC:  PATIENT NAME:  Howard [name], Howard [name] MR#: [id number]

## 2013-08-11 NOTE — Discharge Instructions (Signed)
EGD Discharge instructions Please read the instructions outlined below and refer to this sheet in the next few weeks. These discharge instructions provide you with general information on caring for yourself after you leave the hospital. Your doctor may also give you specific instructions. While your treatment has been planned according to the most current medical practices available, unavoidable complications occasionally occur. If you have any problems or questions after discharge, please call your doctor. ACTIVITY  You may resume your regular activity but move at a slower pace for the next 24 hours.   Take frequent rest periods for the next 24 hours.   Walking will help expel (get rid of) the air and reduce the bloated feeling in your abdomen.   No driving for 24 hours (because of the anesthesia (medicine) used during the test).   You may shower.   Do not sign any important legal documents or operate any machinery for 24 hours (because of the anesthesia used during the test).  NUTRITION  Drink plenty of fluids.   You may resume your normal diet.   Begin with a light meal and progress to your normal diet.   Avoid alcoholic beverages for 24 hours or as instructed by your caregiver.  MEDICATIONS  You may resume your normal medications unless your caregiver tells you otherwise.  WHAT YOU CAN EXPECT TODAY  You may experience abdominal discomfort such as a feeling of fullness or gas pains.  FOLLOW-UP  Your doctor will discuss the results of your test with you.  SEEK IMMEDIATE MEDICAL ATTENTION IF ANY OF THE FOLLOWING OCCUR:  Excessive nausea (feeling sick to your stomach) and/or vomiting.   Severe abdominal pain and distention (swelling).   Trouble swallowing.   Temperature over 101 F (37.8 C).   Rectal bleeding or vomiting of blood.     Continue Prevacid 30 mg daily  Office followup with [place] in 3 months

## 2013-08-11 NOTE — Interval H&P Note (Signed)
History and Physical Interval Note:  08/11/2013 1:07 PM  Howard [name]  has presented today for surgery, with the diagnosis of DYSPHAGIA  The various methods of treatment have been discussed with the patient and family. After consideration of risks, benefits and other options for treatment, the patient has consented to  Procedure(s) with comments: ESOPHAGOGASTRODUODENOSCOPY (EGD) (N/A) - 1:15 SAVORY DILATION (N/A) MALONEY DILATION (N/A) as a surgical intervention .  The patient's history has been reviewed, patient examined, no change in status, stable for surgery.  I have reviewed the patient's chart and labs.  Questions were answered to the patient's satisfaction.     Robert Rourk No change. EGD with dilation as appropriate per plan. Patient remains on Plavix per plan.The risks, benefits, limitations, alternatives and imponderables have been reviewed with the patient. Potential for esophageal dilation, biopsy, etc. have also been reviewed.  Questions have been answered. All parties agreeable.

## 2013-08-11 NOTE — H&P (View-Only) (Signed)
Referring Provider: Lanette Hampshire, MD Primary Care Physician:  Lanette Hampshire, MD Primary GI: Dr. Gala Romney   Chief Complaint  Patient presents with  . Nausea    HPI:   Howard [name] presents today with acute on chronic dysphagia, feeling like his symptoms are worsening since last seen in March 2015. Last EGD in Jan 2014 with +H.pylori, treated with Prevpac. Known dysmotility on BPE in Oct 2014. States his food sits in his epigastric region, then throws it up. Difficulty with solids. No odynophagia.   Feels like chronic diarrhea is at baseline. Somewhat poor historian. From his description, appears to deal with intermittent constipation. Hemoccult positive a few months ago, with possible scant blood in underwear at one point. No other signs of rectal bleeding. Last colonoscopy in 2014 with tubular adenoma. With history of colon cancer in remote past, next surveillance due in 2019. Known history of internal hemorrhoids.   Past Medical History  Diagnosis Date  . Hypertension   . Hyperlipidemia   . Adenocarcinoma 2003    COLON  . PVD (peripheral vascular disease)     ABI 0.78 RIGHT AND 0.80 LEFT 4/11  . Osteoarthritis     OF KNEES  . Kidney stone   . Schatzki's ring 08/05/10    egd with Dr. Gala Romney  . Hiatal hernia 08/05/10    egd with Dr. Gala Romney  . Esophageal dysmotility   . Microscopic colitis   . Internal hemorrhoids   . Diverticula of colon   . CHF (congestive heart failure)   . Coronary artery stenosis     a. 06/2011 Cath: LM 20, LAD min irregs, D1 95 - small,  LCX 60p, RCA 80m, 50d, PLA 50ost;  b. 06/2011 PCI/Rota  RCA  -> 3.0x65mm Promus Element DES  . Angina   . COPD (chronic obstructive pulmonary disease)   . Asthma   . Pneumonia ~ 2005; 09/2005  . Shortness of breath 07/17/11    "laying down"  . Type II diabetes mellitus   . GERD (gastroesophageal reflux disease)   . Anxiety   . Ischemic cardiomyopathy     a. 05/2011 Echo: EF 45-50%, inf/post HK  . H. pylori  infection 03/01/12    treated with prevpac  . Tubular adenoma   . Hyperplastic polyp of intestine     Past Surgical History  Procedure Laterality Date  . Hemicolectomy  2003  . Appendectomy    . Colonoscopy  04/2001    Dr. Laural Golden- colon carcinoma  . Esophagogastroduodenoscopy  04/2001    Dr. Laural Golden  . Esophagogastroduodenoscopy  08/05/10    Dr. Evalee Mutton ring, hiatal hernia  . Colonoscopy  09/04/09    Dr. Matthew Folks, hemorrhoids  . Atherectomy  07/17/11  . Coronary angioplasty  07/17/11    rotablator  . Colon surgery    . Cataract extraction, bilateral    . Esophagogastroduodenoscopy  03/01/2012    Dr. Gala Romney- gastric erosions, small hiatal hernia, +hpylori,=treated with prevpac  . Colonoscopy  03/11/2012    Dr. Sabino Gasser R hemicolectomy, pancolonic diverticulosis, tubular adenoma, hyperplastic polyp    Current Outpatient Prescriptions  Medication Sig Dispense Refill  . albuterol (ACCUNEB) 1.25 MG/3ML nebulizer solution Take 1 ampule by nebulization 4 (four) times daily.      . carvedilol (COREG) 3.125 MG tablet Take 3.125 mg by mouth 2 (two) times daily with a meal.       . Cinnamon 500 MG TABS Take 500 mg by mouth daily.      Marland Kitchen  clopidogrel (PLAVIX) 75 MG tablet Take 1 tablet (75 mg total) by mouth daily.  90 tablet  3  . glipiZIDE (GLUCOTROL XL) 2.5 MG 24 hr tablet Take 2.5 mg by mouth daily with breakfast.      . lansoprazole (PREVACID) 30 MG capsule Take 1 capsule (30 mg total) by mouth daily before breakfast.  30 capsule  11  . loperamide (IMODIUM A-D) 2 MG tablet Take 2 mg by mouth daily as needed for diarrhea or loose stools.       Marland Kitchen LORazepam (ATIVAN) 0.5 MG tablet Take 0.5 mg by mouth every 4 (four) hours as needed.       . potassium chloride SA (K-DUR,KLOR-CON) 20 MEQ tablet Take 20 mEq by mouth daily.      Marland Kitchen Propylene Glycol (SYSTANE BALANCE) 0.6 % SOLN Apply 1 drop to eye 2 (two) times daily.      . simvastatin (ZOCOR) 40 MG tablet Take 40 mg by mouth every  evening.      . temazepam (RESTORIL) 15 MG capsule Take 15 mg by mouth at bedtime as needed for sleep.      Marland Kitchen torsemide (DEMADEX) 20 MG tablet Take 2 tablets (40 mg total) by mouth daily.  60 tablet  6  . traMADol (ULTRAM) 50 MG tablet Take 50 mg by mouth every 6 (six) hours as needed.      . ferrous sulfate 325 (65 FE) MG EC tablet Take 325 mg by mouth 2 (two) times daily with a meal.       No current facility-administered medications for this visit.    Allergies as of 08/02/2013  . (No Known Allergies)    Family History  Problem Relation Age of Onset  . Coronary artery disease Father   . Coronary artery disease Brother     CABG  . Diabetes Brother   . Coronary artery disease Brother   . Heart Problems Mother     History   Social History  . Marital Status: Married    Spouse Name: N/A    Number of Children: N/A  . Years of Education: N/A   Social History Main Topics  . Smoking status: Former Smoker -- 20 years    Types: Cigarettes    Quit date: 02/25/2008  . Smokeless tobacco: Never Used  . Alcohol Use: No  . Drug Use: No  . Sexual Activity: Not Currently   Other Topics Concern  . None   Social History Narrative   Lives in [place]. Married. Wife in [place].   Son lives with him   Caffeine Use-    Review of Systems: As mentioned in HPI.   Physical Exam: BP 110/63  Pulse 81  Temp(Src) 98 F (36.7 C) (Oral)  Resp 20  Ht 5\' 7"  (1.702 m)  Wt 206 lb 3.2 oz (93.532 kg)  BMI 32.29 kg/m2 General:   Alert and oriented. No distress noted. Pleasant and cooperative.  Head:  Normocephalic and atraumatic. Eyes:  Conjuctiva clear without scleral icterus. Heart:  S1, S2 present without murmur Abdomen:  Obese, protuberant, +BS, soft, non-tender, query possible ventral hernia Msk:  Symmetrical without gross deformities. Normal posture. Extremities:  1+ lower extremity edema Neurologic:  Alert and  oriented x4;  grossly normal neurologically. Skin:  Intact without  significant lesions or rashes. Psych:  Alert and cooperative. Normal mood and affect.

## 2013-08-19 ENCOUNTER — Encounter (HOSPITAL_COMMUNITY): Payer: Self-pay | Admitting: Internal Medicine

## 2013-08-19 ENCOUNTER — Non-Acute Institutional Stay (SKILLED_NURSING_FACILITY): Payer: Medicare Other | Admitting: Internal Medicine

## 2013-08-19 DIAGNOSIS — I2510 Atherosclerotic heart disease of native coronary artery without angina pectoris: Secondary | ICD-10-CM

## 2013-08-19 DIAGNOSIS — C189 Malignant neoplasm of colon, unspecified: Secondary | ICD-10-CM

## 2013-08-19 DIAGNOSIS — I10 Essential (primary) hypertension: Secondary | ICD-10-CM

## 2013-08-19 DIAGNOSIS — E119 Type 2 diabetes mellitus without complications: Secondary | ICD-10-CM

## 2013-08-19 DIAGNOSIS — I2584 Coronary atherosclerosis due to calcified coronary lesion: Secondary | ICD-10-CM

## 2013-08-19 DIAGNOSIS — I509 Heart failure, unspecified: Secondary | ICD-10-CM

## 2013-08-19 DIAGNOSIS — D649 Anemia, unspecified: Secondary | ICD-10-CM

## 2013-08-19 DIAGNOSIS — K219 Gastro-esophageal reflux disease without esophagitis: Secondary | ICD-10-CM

## 2013-08-19 NOTE — Progress Notes (Signed)
Patient ID: Howard [name], male   DOB: 22-Jun-1929, 80 y.o.   MRN: [id number]            This is a routin visit.   Level care skilled.   [place].   Chief complaint-medical management of chronic issues including CHF anemia hypertension COPD history of GERD and colonic adenoma-diabetes type 2-acute visit secondary to elevated blood sugars-.   History of present illness.   Patient is a pleasant 80 year old male who was recently admitted to the nursing home about 10 months ago secondary to increased care needs weakness and recent hospitalization for a fall with generalized weakness and abdominal pain and diarrhea complicated with dehydration.   Previously patient had been treated for C. difficile with oral vancomycin.   During his hospitalization he received IV fluids and was seen by GI.   He does have a previous history of irritable bowel syndrome anemia of chronic disease-GI service felt he didn't need any further treatment for the colitis.--He apparently had a small amount of rectal bleeding recently and it back positive for stool 2 or 3 times-Dr. Dellia Nims did order a GI followup  Subsequently  He had an EGD with [facility] dilation and a gastric biopsy.  Impression was a subtle Schatzki's ring otherwise normal esophagus-no ulcer or infiltrating process observed.  He did have a Maloney dilation also had biopsies of the abdominal gastric mucosa taken-recommendation was to continue Prevacid 30 mg a day with followup on pathology  .   He was also diagnosed with acute on chronic CHF secondary to shortness of breath in the hospital--he has been followed by cardiology and his Lasix was switched to [facility] recently apparently with favorable results per cardiology note that I have reviewed.  Also had an echo done in April which showed improved left ventricular function apparently 60-65%-he has a diagnosis of grade 1 diastolic dysfunction  He is a type II diabetic  apparently this has been well-controlled however recent blood sugars have been taken more frequently secondary to some spot high readings the last couple days his morning sugars have run 252-202.  At noon 4 48.  So far at 4 PM 373 and 338.  And at at bedtime 357 and 340  He is on glipizide 2.5 mg a day.  Patient's other medical issues appear stable including anemia with recent hemoglobin stable at 10.9.  He is on iron  He does have a history of COPD-he says he has been somewhat slightly more shortness of breath the last few days-he is on Spiriva as well as Symbicort and  Also on albuterol nebulizers 4 times a day  He also has some skin issues he is concerned about including an erythematous area with scaling on his right ear-also has a large seborrheic keratosis appearing area on his left temple.  .  .      Previous medical history.   History of falls at home.   History CDiF.   History irritable bowel syndrome.   History of colon carcinoma status post hemi colectomy in 2003   Diabetes2.   Acute on chronic systolic CHF ejection fraction 45-50%.   Coronary artery disease.   Anemia of chronic disease.   Iron deficiency anemia.   Hypertension.   GERD.   History of dizziness.   Insomnia.   Anxiety.   Surgical history.   2003-history Hemicolectomy  secondary to colon carcinoma-also history of appendectomy   Bilateral cataract extractions.   Social history.   Patient recently lost  his wife of 21 years she had been a long-term resident of this facility.   Previously had lived with his son.   20 year history of smoking he quit in January of 2010.   No history of alcohol or illicit drug use.   Family history.   Father deceased with a history of coronary artery disease.   Mother deceased with a history of heart problems.   Has a brother who had a history of coronary artery disease status post CABG as well as  diabetes   Medications.    Plavix 75 mg daily.   Cinnamon one capsule daily.     Iron 325 mg twice a day.   Coreg 3.125 mg twice a day.   Systane eyedrops twice a day into each eye.   Antivert 12.5 mg 3 times a day.   Zocor 40 mg daily.   Restoril 150 mg each bedtime when necessary.   Ambien 5 mg each bedtime when necessary.  Antivert 12.5 mg 3 times a day.  Zocor 40 mg a day.  Albuterol nebulizers 4 times a day.  Symbicort 2 puffs twice a day.  Tramadol 50 mg 1 tab every 6 hours when necessary.  Ativan 0.5 mg every 4 hours when necessary anxiety.  Potassium 20 mEq a day.   Spiriva one capsule daily .   Glucotrol XL 2.5 mg daily       .   Review of systems.   In general denies any fever or chills there has have occasional cough --says his breathing seems a little more short the last few days.   Skin-does not complaining of any itching or rashes--but does have skin issues as noted above.   Head ears eyes nose mouth and throat-does not complaining of visual changes--has occasional sore throat--no nasal discharge.   Respiratory-thinks he's a little more short of breath than usual the last few days.   Cardiac-does not complaining of chest pain does have a history of CHF.   GI-fairly extensive history as noted above but currently is denying any diarrhea abdominal pain nausea vomiting diarrhea or constipation.   GU-does not complaining of dysuria.   Muscle skeletal does not complaining of joint pain.   Neurologic no complaints of headache  or syncopal-type feelings.--Says at times he has dizziness with sudden movements again he is on [place] is not complaining of depression does have some history of an anxiety.   Physical exam.   Temperature 96.9 pulse 62 respirations 18 blood pressure 114/70 weight is stable at 207 .   In general this is a pleasant elderly male in no distress sitting comfortably in his  wheelchair.   His skin is warm and dry.--- Middle of his right earlobe there is a area of darkened erythema that appears dry he is concerned about this-also has a large seborrheic keratoses left temple area that he is concerned about a do not see any surrounding erythema or drainage or sign of infection here     Eyes he does have a disconjugate gaze left eye --visual acuity appears grossly intact sclera and conjunctiva are clear.   Oropharynx clear mucous membranes moist.   Chest--no labored breathing he speaks in full sentences-however there is some coarse expiratory congestion rather diffuse.   Heart is regular rate and rhythm without murmur gallop or rub he has trace lower extremity edema.   Abdomen is soft nontender somewhat obese with positive bowel sounds he has a well-healed surgical scar.  Muscle skeletal moves all extremities at baseline appears to have some lower the weakness he ambulates in a wheelchair.   Neurologic is grossly intact speech is clear and do not see any lateralizing findings.   Psych he is alert and oriented x3 pleasant and appropriate.   Labs.  06/24/2013.  WBC 9.4 hemoglobin 10.9 platelets 192.  Sodium 139 potassium 4.1 BUN 27 creatinine 1.15   02/12/2013.   Hemoglobin A1c 5.6.   11/08/2012   Sodium 141 potassium 4.5 BUN 23 creatinine 1.35.   11/04/2012.   WBC 6.3 hemoglobin 11.5 platelets 204.   Liver function tests within normal limits except albumin of 2.8.    Assessment and plan   #1-CHF-- cardiomyopathy- this appears to be stable he is on Demadex with potassium supplementation as well as a beta blocker-his weights have been stable-recent echo shows improved ejection fraction --will update metabolic panel      #9-YVOPFYT of diabetes type 2--has had recently elevated blood sugars we have increased his readings to a.c. and at bedtime every day also has been started on a loose sliding scale a.c. meals starting at  200-so far fairly minimal readings with the more frequent sugars recently ordered.--Also will update hemoglobin A1c   he does appear to be fairly consistently high continue to monitor this closely continue the NovoLog sliding scale a.c. meals no at bedtime coverage call provider if greater than 300--consider increasing his Glucotrol however would like to get more readings since we don't have many readings so far would like to make sure there is some consistency here in the morning before increasing his medication    #3-history coronary artery disease-he appears to be largely asymptomatic currently he is on Plavix.   #4-history of GERD--as well as numerous GI issues as noted above-again he did have a recent EGD biopsy results are pending he is followed by GI he is on Prevacid .   #5history of anemia he is on iron supplementation --hemoglobin has been stable we'll update CBC--   #6-history of hyperlipidemia-he is on Zocor Will update liver function tests as well as a fasting lipid panel.   #7-history of colon cancer status post  themi- colectomy--this is followed by Dr.Rourke--per recent notes I this appears to be stable he mainly has been seen  By GI   #8-history of vertigo he is on Antivert 3 times a day apparently this is helping.  #9 history of anxiety he is on Ativan-nursing staff has requested that be made routine apparently has fairly persistent anxiety-. However would like to monitor for now he did not appear anxious his evening-at this point will monitor-if this is truly persistent certainly will consider routine  #10 -- history COPD again he is on Spiriva in Symbicort-as well as albuterol nebulizers routinely-will order a chest x-ray secondary to some complaints of somewhat increased shortness of breath although certainly does not show any signs of respiratory distress here-also monitor vital signs pulse ox every shift for 72 hours  #11 skin issues as noted above will order a  dermatology consult     [id number]-of note greater than 40 minutes spent assessing patient-ad dressing his concerns-and  coordinating and formulating a plan of care for numerous diagnoses-of note greater than 50% of time spent coordinating plan of care       .   Marland Kitchen

## 2013-08-22 NOTE — Progress Notes (Signed)
Quick Note:  I don't see a note on this patient from [facility] on 6/24 ______

## 2013-08-22 NOTE — Progress Notes (Signed)
I don't see a note on this patient from [facility] on 6/24

## 2013-08-24 ENCOUNTER — Other Ambulatory Visit: Payer: Self-pay

## 2013-08-24 ENCOUNTER — Encounter: Payer: Self-pay | Admitting: Internal Medicine

## 2013-08-24 DIAGNOSIS — Z8719 Personal history of other diseases of the digestive system: Secondary | ICD-10-CM

## 2013-08-25 ENCOUNTER — Telehealth: Payer: Self-pay

## 2013-08-25 NOTE — Telephone Encounter (Signed)
Letter from: Daneil Dolin    Reason for Letter: Results Review    Send letter to patient.  Send copy of letter with path to referring provider and PCP.   Need hp stool antigen done; plse follow protocol

## 2013-08-25 NOTE — Telephone Encounter (Signed)
I called [facility] and spoke with Delfino Lovett LPN, informed him of stool test needed and that pt would need to stop prevacid for 14 days prior to collecting sample. Faxed results letter, lab orders and letter with lab instructions to [phone]. Richard verbalized understanding.

## 2013-08-29 ENCOUNTER — Non-Acute Institutional Stay (SKILLED_NURSING_FACILITY): Payer: Medicare Other | Admitting: Internal Medicine

## 2013-08-29 DIAGNOSIS — IMO0001 Reserved for inherently not codable concepts without codable children: Secondary | ICD-10-CM

## 2013-08-29 DIAGNOSIS — E1165 Type 2 diabetes mellitus with hyperglycemia: Principal | ICD-10-CM

## 2013-09-05 ENCOUNTER — Non-Acute Institutional Stay (SKILLED_NURSING_FACILITY): Payer: Medicare Other | Admitting: Internal Medicine

## 2013-09-05 DIAGNOSIS — IMO0001 Reserved for inherently not codable concepts without codable children: Secondary | ICD-10-CM

## 2013-09-05 DIAGNOSIS — I255 Ischemic cardiomyopathy: Secondary | ICD-10-CM

## 2013-09-05 DIAGNOSIS — I2589 Other forms of chronic ischemic heart disease: Secondary | ICD-10-CM

## 2013-09-05 DIAGNOSIS — E1165 Type 2 diabetes mellitus with hyperglycemia: Principal | ICD-10-CM

## 2013-09-05 NOTE — Progress Notes (Addendum)
Patient ID: Howard [name], male   DOB: March 06, 1929, 80 y.o.   MRN: [id number]                PROGRESS NOTE  DATE:  08/29/2013    FACILITY: [facility]    LEVEL OF CARE:   SNF   Acute Visit   CHIEF COMPLAINT:  Review of hyperglycemia.    HISTORY OF PRESENT ILLNESS:  Howard [name] is a complicated gentleman with a host of medical problems.    He is also a type 2 diabetic.  When I took over his care in February, he was on both Glucotrol XL 2.5 and on Onglyza 5 mg daily.  I actually stopped the Onglyza as his hemoglobin A1c was 6.2.  He remained on Glucotrol XL at 2.5.  We started seeing spot elevations of his blood sugar.  He has since been put on a sliding scale.  Yesterday's blood sugars were 193/337/280/194.  Most of his fasting blood sugars are higher than that and his a.c. lunch blood sugars are well above 300.    REVIEW OF SYSTEMS:   CHEST/RESPIRATORY:  He states he is short of breath (has both COPD and CHF).     CARDIAC:   He is not complaining of chest pain.   GI:  No nausea,  vomiting or abdominal pain.    GU:  No clear dysuria.    PHYSICAL EXAMINATION:   GENERAL APPEARANCE:  The patient does not look to be in any distress.   CHEST/RESPIRATORY:  Shallow, but otherwise clear air entry bilaterally.  There is no wheezing.   CARDIOVASCULAR:  CARDIAC:   Heart sounds are normal.  There are no murmurs.   GASTROINTESTINAL:  ABDOMEN:   Distended, as usual.  However, there are no masses.   GENITOURINARY:  BLADDER:   Not distended or tender.  There is no CVA tenderness.    ASSESSMENT/PLAN:  Type 2 diabetes.  Poorly controlled.   I am not completely certain why this has fallen out so rapidly.  He does not appear to be medically unwell.  I am also not completely certain that he will manage without insulin.   In fact, I am probably going to put him on some h.s. Lantus, discontinue the Glucotrol XL.  I will bring the blood sugars down and see if we can transition him to oral agents.  His  lab work will be checked.

## 2013-09-08 NOTE — Progress Notes (Addendum)
Patient ID: Howard [name], male   DOB: [birth year], 80 y.o.   MRN: [id number]               PROGRESS NOTE  DATE:  09/05/2013    FACILITY: [facility]     LEVEL OF CARE:   SNF   Acute Visit   CHIEF COMPLAINT:  Follow up multiple medical issues, including uncontrolled diabetes.    HISTORY OF PRESENT ILLNESS:  [name] is a gentleman who came to my care on Glucotrol XL.  However, recently he has had rapidly escalating blood sugars.  The cause of this is not really clear.  Two weeks ago, I put him on Lantus at h.s.  Although we have better control of the a.m. blood sugars, the rest of the day he is in the mid 200s to as high as 300.     The patient basically states that he feels generally unwell.  He is complaining of shortness of breath.  He is also being evaluated by Dr. Gala Romney of Gastroenterology for refractory nausea and episodic complaints of vomiting although, according to the staff, he eats well.  His weight is 207.  This has not changed much in the last month.    He has a history of acute-on-chronic systolic heart failure with an ejection fraction of 40-45%, although I see no evidence that this is active.    REVIEW OF SYSTEMS:   CHEST/RESPIRATORY:  He is complaining about shortness of breath.  Episodically wears his oxygen.   CARDIAC:   No clear chest pain.   GI:  Complains of unrelenting nausea and vomiting, although staff claim that he does not vomit and eats well.   ENDOCRINE:  As noted, blood sugars are better in the a.m., mid 100s.  However, they are high the rest of the day.    PHYSICAL EXAMINATION:   VITAL SIGNS:   RESPIRATIONS:   18 and unlabored.   O2 SATURATIONS:  95%.   GENERAL APPEARANCE:  The patient appears depressed.  Multiple complaints, somatic predominantly.   CHEST/RESPIRATORY:  Shallow, but otherwise clear air entry bilaterally.    CARDIOVASCULAR:  CARDIAC:   Heart sounds are normal.  There is no evidence of congestive heart failure.   GASTROINTESTINAL:    ABDOMEN:   Distended; however, nontender.  There are no overt masses.   CIRCULATION:   EDEMA/VARICOSITIES:  Extremities:  No edema.     ASSESSMENT/PLAN:  Type 2 diabetes.  Poorly controlled.  I am going to increase his h.s. Lantus to 5 U and give him regular NovoLog a.c. meals.    COPD.  This does not appear to be unstable.    Chronic systolic heart failure with an ejection fraction of 40-45%.  I do not see this on exam.    Complaints of nausea with vomiting.  Dr. Gala Romney has  apparently done an endoscopy showing gastritis.  He is on proton pump inhibitors.  He has ordered a fecal stool for Helicobacter.    I have adjusted his insulin, started him on Cymbalta for unrelenting anxiety with coexistent depression.  ?somatic complaints  CPT CODE: [id number]

## 2013-11-11 ENCOUNTER — Ambulatory Visit: Payer: Medicare Other | Admitting: Gastroenterology

## 2013-12-03 ENCOUNTER — Encounter (HOSPITAL_COMMUNITY): Payer: Self-pay | Admitting: Emergency Medicine

## 2013-12-03 ENCOUNTER — Inpatient Hospital Stay (HOSPITAL_COMMUNITY)
Admission: EM | Admit: 2013-12-03 | Discharge: 2013-12-06 | DRG: 193 | Disposition: A | Discharge status: Skilled Nursing Facility | Payer: Medicare Other | Attending: Internal Medicine | Admitting: Internal Medicine

## 2013-12-03 ENCOUNTER — Emergency Department (HOSPITAL_COMMUNITY): Payer: Medicare Other

## 2013-12-03 DIAGNOSIS — M1990 Unspecified osteoarthritis, unspecified site: Secondary | ICD-10-CM | POA: Diagnosis present

## 2013-12-03 DIAGNOSIS — E119 Type 2 diabetes mellitus without complications: Secondary | ICD-10-CM | POA: Diagnosis present

## 2013-12-03 DIAGNOSIS — Z794 Long term (current) use of insulin: Secondary | ICD-10-CM

## 2013-12-03 DIAGNOSIS — Z833 Family history of diabetes mellitus: Secondary | ICD-10-CM | POA: Diagnosis not present

## 2013-12-03 DIAGNOSIS — E785 Hyperlipidemia, unspecified: Secondary | ICD-10-CM | POA: Diagnosis present

## 2013-12-03 DIAGNOSIS — Z87891 Personal history of nicotine dependence: Secondary | ICD-10-CM

## 2013-12-03 DIAGNOSIS — I739 Peripheral vascular disease, unspecified: Secondary | ICD-10-CM | POA: Diagnosis present

## 2013-12-03 DIAGNOSIS — Z79899 Other long term (current) drug therapy: Secondary | ICD-10-CM | POA: Diagnosis not present

## 2013-12-03 DIAGNOSIS — E669 Obesity, unspecified: Secondary | ICD-10-CM | POA: Diagnosis present

## 2013-12-03 DIAGNOSIS — J189 Pneumonia, unspecified organism: Principal | ICD-10-CM | POA: Diagnosis present

## 2013-12-03 DIAGNOSIS — Z9981 Dependence on supplemental oxygen: Secondary | ICD-10-CM | POA: Diagnosis not present

## 2013-12-03 DIAGNOSIS — I5032 Chronic diastolic (congestive) heart failure: Secondary | ICD-10-CM | POA: Diagnosis present

## 2013-12-03 DIAGNOSIS — J440 Chronic obstructive pulmonary disease with acute lower respiratory infection: Secondary | ICD-10-CM | POA: Diagnosis present

## 2013-12-03 DIAGNOSIS — Z8249 Family history of ischemic heart disease and other diseases of the circulatory system: Secondary | ICD-10-CM | POA: Diagnosis not present

## 2013-12-03 DIAGNOSIS — J45909 Unspecified asthma, uncomplicated: Secondary | ICD-10-CM | POA: Diagnosis present

## 2013-12-03 DIAGNOSIS — I255 Ischemic cardiomyopathy: Secondary | ICD-10-CM | POA: Diagnosis present

## 2013-12-03 DIAGNOSIS — D72829 Elevated white blood cell count, unspecified: Secondary | ICD-10-CM

## 2013-12-03 DIAGNOSIS — G9341 Metabolic encephalopathy: Secondary | ICD-10-CM | POA: Diagnosis present

## 2013-12-03 DIAGNOSIS — I10 Essential (primary) hypertension: Secondary | ICD-10-CM | POA: Diagnosis present

## 2013-12-03 DIAGNOSIS — J9621 Acute and chronic respiratory failure with hypoxia: Secondary | ICD-10-CM | POA: Diagnosis present

## 2013-12-03 DIAGNOSIS — Y95 Nosocomial condition: Secondary | ICD-10-CM | POA: Diagnosis present

## 2013-12-03 DIAGNOSIS — I2510 Atherosclerotic heart disease of native coronary artery without angina pectoris: Secondary | ICD-10-CM | POA: Diagnosis present

## 2013-12-03 DIAGNOSIS — Z85038 Personal history of other malignant neoplasm of large intestine: Secondary | ICD-10-CM | POA: Diagnosis not present

## 2013-12-03 DIAGNOSIS — Z6831 Body mass index (BMI) 31.0-31.9, adult: Secondary | ICD-10-CM | POA: Diagnosis not present

## 2013-12-03 DIAGNOSIS — Z7902 Long term (current) use of antithrombotics/antiplatelets: Secondary | ICD-10-CM

## 2013-12-03 DIAGNOSIS — R0602 Shortness of breath: Secondary | ICD-10-CM | POA: Diagnosis present

## 2013-12-03 DIAGNOSIS — Z9861 Coronary angioplasty status: Secondary | ICD-10-CM | POA: Diagnosis not present

## 2013-12-03 DIAGNOSIS — I25118 Atherosclerotic heart disease of native coronary artery with other forms of angina pectoris: Secondary | ICD-10-CM

## 2013-12-03 DIAGNOSIS — K219 Gastro-esophageal reflux disease without esophagitis: Secondary | ICD-10-CM | POA: Diagnosis present

## 2013-12-03 DIAGNOSIS — J9620 Acute and chronic respiratory failure, unspecified whether with hypoxia or hypercapnia: Secondary | ICD-10-CM

## 2013-12-03 DIAGNOSIS — G928 Other toxic encephalopathy: Secondary | ICD-10-CM

## 2013-12-03 DIAGNOSIS — G92 Toxic encephalopathy: Secondary | ICD-10-CM

## 2013-12-03 LAB — BASIC METABOLIC PANEL
Anion gap: 10 (ref 5–15)
BUN: 17 mg/dL (ref 6–23)
CALCIUM: 9.5 mg/dL (ref 8.4–10.5)
CO2: 33 meq/L — AB (ref 19–32)
Chloride: 97 mEq/L (ref 96–112)
Creatinine, Ser: 0.98 mg/dL (ref 0.50–1.35)
GFR calc Af Amer: 86 mL/min — ABNORMAL LOW (ref >90)
GFR calc non Af Amer: 74 mL/min — ABNORMAL LOW (ref >90)
GLUCOSE: 200 mg/dL — AB (ref 70–99)
Potassium: 5.1 mEq/L (ref 3.7–5.3)
SODIUM: 140 meq/L (ref 137–147)

## 2013-12-03 LAB — PRO B NATRIURETIC PEPTIDE: Pro B Natriuretic peptide (BNP): 2384 pg/mL — ABNORMAL HIGH (ref 0–450)

## 2013-12-03 LAB — HEPATIC FUNCTION PANEL
ALT: 9 U/L (ref 0–53)
AST: 12 U/L (ref 0–37)
Albumin: 3.1 g/dL — ABNORMAL LOW (ref 3.5–5.2)
Alkaline Phosphatase: 73 U/L (ref 39–117)
Bilirubin, Direct: <0.2 mg/dL (ref 0.0–0.3)
TOTAL PROTEIN: 7 g/dL (ref 6.0–8.3)
Total Bilirubin: 0.8 mg/dL (ref 0.3–1.2)

## 2013-12-03 LAB — CBC
HEMATOCRIT: 33 % — AB (ref 39.0–52.0)
Hemoglobin: 10.5 g/dL — ABNORMAL LOW (ref 13.0–17.0)
MCH: 33.3 pg (ref 26.0–34.0)
MCHC: 31.8 g/dL (ref 30.0–36.0)
MCV: 104.8 fL — AB (ref 78.0–100.0)
Platelets: 236 10*3/uL (ref 150–400)
RBC: 3.15 MIL/uL — AB (ref 4.22–5.81)
RDW: 14.1 % (ref 11.5–15.5)
WBC: 15 10*3/uL — AB (ref 4.0–10.5)

## 2013-12-03 LAB — GLUCOSE, CAPILLARY
GLUCOSE-CAPILLARY: 216 mg/dL — AB (ref 70–99)
Glucose-Capillary: 129 mg/dL — ABNORMAL HIGH (ref 70–99)

## 2013-12-03 LAB — INFLUENZA PANEL BY PCR (TYPE A & B)
H1N1 flu by pcr: NOT DETECTED
INFLAPCR: NEGATIVE
Influenza B By PCR: NEGATIVE

## 2013-12-03 LAB — TROPONIN I: Troponin I: <0.3 ng/mL (ref <0.30)

## 2013-12-03 MED ORDER — POLYETHYLENE GLYCOL 3350 17 G PO PACK
17.0000 g | PACK | Freq: Every day | ORAL | Status: DC
  Administered 2013-12-03 – 2013-12-06 (×4): 17 g via ORAL
  Filled 2013-12-03 (×4): qty 1

## 2013-12-03 MED ORDER — ONDANSETRON HCL 4 MG PO TABS: 4.0000 mg | ORAL_TABLET | Freq: Four times a day (QID) | ORAL | Status: DC | PRN

## 2013-12-03 MED ORDER — PIPERACILLIN-TAZOBACTAM 3.375 G IVPB 30 MIN
3.3750 g | Freq: Once | INTRAVENOUS | Status: AC
  Administered 2013-12-03: 3.375 g via INTRAVENOUS
  Filled 2013-12-03: qty 50

## 2013-12-03 MED ORDER — SENNOSIDES-DOCUSATE SODIUM 8.6-50 MG PO TABS: 1.0000 | ORAL_TABLET | Freq: Every evening | ORAL | Status: DC | PRN

## 2013-12-03 MED ORDER — SIMVASTATIN 20 MG PO TABS
40.0000 mg | ORAL_TABLET | Freq: Every evening | ORAL | Status: DC
  Administered 2013-12-03 – 2013-12-05 (×3): 40 mg via ORAL
  Filled 2013-12-03 (×3): qty 2

## 2013-12-03 MED ORDER — TORSEMIDE 20 MG PO TABS
40.0000 mg | ORAL_TABLET | Freq: Every day | ORAL | Status: DC
  Administered 2013-12-03 – 2013-12-06 (×4): 40 mg via ORAL
  Filled 2013-12-03 (×4): qty 2

## 2013-12-03 MED ORDER — VANCOMYCIN HCL IN DEXTROSE 1-5 GM/200ML-% IV SOLN
INTRAVENOUS | Status: AC
  Filled 2013-12-03: qty 400

## 2013-12-03 MED ORDER — SODIUM CHLORIDE 0.9 % IJ SOLN
3.0000 mL | Freq: Two times a day (BID) | INTRAMUSCULAR | Status: DC
  Administered 2013-12-03 – 2013-12-05 (×3): 3 mL via INTRAVENOUS

## 2013-12-03 MED ORDER — ALBUTEROL SULFATE (2.5 MG/3ML) 0.083% IN NEBU: 2.5000 mg | INHALATION_SOLUTION | Freq: Four times a day (QID) | RESPIRATORY_TRACT | Status: DC

## 2013-12-03 MED ORDER — CARVEDILOL 3.125 MG PO TABS
3.1250 mg | ORAL_TABLET | Freq: Two times a day (BID) | ORAL | Status: DC
  Administered 2013-12-03 – 2013-12-06 (×5): 3.125 mg via ORAL
  Filled 2013-12-03 (×6): qty 1

## 2013-12-03 MED ORDER — MECLIZINE HCL 12.5 MG PO TABS
12.5000 mg | ORAL_TABLET | Freq: Three times a day (TID) | ORAL | Status: DC
  Administered 2013-12-03 – 2013-12-06 (×9): 12.5 mg via ORAL
  Filled 2013-12-03 (×9): qty 1

## 2013-12-03 MED ORDER — INSULIN ASPART 100 UNIT/ML ~~LOC~~ SOLN
4.0000 [IU] | Freq: Three times a day (TID) | SUBCUTANEOUS | Status: DC
  Administered 2013-12-03 – 2013-12-06 (×9): 4 [IU] via SUBCUTANEOUS

## 2013-12-03 MED ORDER — VANCOMYCIN HCL IN DEXTROSE 1-5 GM/200ML-% IV SOLN
INTRAVENOUS | Status: AC
  Filled 2013-12-03: qty 200

## 2013-12-03 MED ORDER — ALBUTEROL SULFATE (2.5 MG/3ML) 0.083% IN NEBU
2.5000 mg | INHALATION_SOLUTION | Freq: Four times a day (QID) | RESPIRATORY_TRACT | Status: DC
  Administered 2013-12-03 – 2013-12-06 (×12): 2.5 mg via RESPIRATORY_TRACT
  Filled 2013-12-03 (×13): qty 3

## 2013-12-03 MED ORDER — TRAMADOL HCL 50 MG PO TABS
50.0000 mg | ORAL_TABLET | Freq: Four times a day (QID) | ORAL | Status: DC | PRN
  Administered 2013-12-04 – 2013-12-06 (×3): 50 mg via ORAL
  Filled 2013-12-03 (×3): qty 1

## 2013-12-03 MED ORDER — VANCOMYCIN HCL IN DEXTROSE 1-5 GM/200ML-% IV SOLN
1000.0000 mg | Freq: Two times a day (BID) | INTRAVENOUS | Status: DC
  Administered 2013-12-03 – 2013-12-05 (×5): 1000 mg via INTRAVENOUS
  Filled 2013-12-03 (×6): qty 200

## 2013-12-03 MED ORDER — BUDESONIDE-FORMOTEROL FUMARATE 160-4.5 MCG/ACT IN AERO
INHALATION_SPRAY | RESPIRATORY_TRACT | Status: AC
Start: 2013-12-03 — End: 2013-12-03
  Filled 2013-12-03: qty 6

## 2013-12-03 MED ORDER — POTASSIUM CHLORIDE CRYS ER 20 MEQ PO TBCR
20.0000 meq | EXTENDED_RELEASE_TABLET | Freq: Every day | ORAL | Status: DC
  Administered 2013-12-03 – 2013-12-06 (×4): 20 meq via ORAL
  Filled 2013-12-03 (×4): qty 1

## 2013-12-03 MED ORDER — SODIUM CHLORIDE 0.9 % IV SOLN: 250.0000 mL | INTRAVENOUS | Status: DC | PRN

## 2013-12-03 MED ORDER — CEFEPIME HCL 1 G IJ SOLR
INTRAMUSCULAR | Status: AC
  Filled 2013-12-03 (×2): qty 1

## 2013-12-03 MED ORDER — VANCOMYCIN HCL IN DEXTROSE 1-5 GM/200ML-% IV SOLN
1000.0000 mg | Freq: Once | INTRAVENOUS | Status: DC
  Filled 2013-12-03: qty 200

## 2013-12-03 MED ORDER — ENOXAPARIN SODIUM 40 MG/0.4ML ~~LOC~~ SOLN
40.0000 mg | SUBCUTANEOUS | Status: DC
  Administered 2013-12-03 – 2013-12-05 (×3): 40 mg via SUBCUTANEOUS
  Filled 2013-12-03 (×3): qty 0.4

## 2013-12-03 MED ORDER — ONDANSETRON HCL 4 MG/2ML IJ SOLN: 4.0000 mg | Freq: Four times a day (QID) | INTRAMUSCULAR | Status: DC | PRN

## 2013-12-03 MED ORDER — DEXTROSE 5 % IV SOLN
INTRAVENOUS | Status: AC
  Filled 2013-12-03: qty 1

## 2013-12-03 MED ORDER — CLOPIDOGREL BISULFATE 75 MG PO TABS
75.0000 mg | ORAL_TABLET | Freq: Every day | ORAL | Status: DC
  Administered 2013-12-03 – 2013-12-06 (×4): 75 mg via ORAL
  Filled 2013-12-03 (×4): qty 1

## 2013-12-03 MED ORDER — ACETAMINOPHEN 650 MG RE SUPP: 650.0000 mg | Freq: Four times a day (QID) | RECTAL | Status: DC | PRN

## 2013-12-03 MED ORDER — INSULIN GLARGINE 100 UNIT/ML ~~LOC~~ SOLN
15.0000 [IU] | Freq: Every day | SUBCUTANEOUS | Status: DC
  Administered 2013-12-03 – 2013-12-05 (×3): 15 [IU] via SUBCUTANEOUS
  Filled 2013-12-03 (×4): qty 0.15

## 2013-12-03 MED ORDER — LORAZEPAM 0.5 MG PO TABS
0.5000 mg | ORAL_TABLET | ORAL | Status: DC | PRN
  Administered 2013-12-04: 0.5 mg via ORAL
  Filled 2013-12-03 (×2): qty 1

## 2013-12-03 MED ORDER — TEMAZEPAM 15 MG PO CAPS
15.0000 mg | ORAL_CAPSULE | Freq: Every evening | ORAL | Status: DC | PRN
  Administered 2013-12-04 – 2013-12-05 (×3): 15 mg via ORAL
  Filled 2013-12-03 (×3): qty 1

## 2013-12-03 MED ORDER — INSULIN ASPART 100 UNIT/ML ~~LOC~~ SOLN
0.0000 [IU] | Freq: Three times a day (TID) | SUBCUTANEOUS | Status: DC
  Administered 2013-12-03: 5 [IU] via SUBCUTANEOUS
  Administered 2013-12-04: 3 [IU] via SUBCUTANEOUS
  Administered 2013-12-04: 5 [IU] via SUBCUTANEOUS
  Administered 2013-12-04: 3 [IU] via SUBCUTANEOUS
  Administered 2013-12-05: 8 [IU] via SUBCUTANEOUS
  Administered 2013-12-05: 5 [IU] via SUBCUTANEOUS
  Administered 2013-12-06: 2 [IU] via SUBCUTANEOUS
  Administered 2013-12-06: 3 [IU] via SUBCUTANEOUS

## 2013-12-03 MED ORDER — BUDESONIDE-FORMOTEROL FUMARATE 160-4.5 MCG/ACT IN AERO
2.0000 | INHALATION_SPRAY | Freq: Two times a day (BID) | RESPIRATORY_TRACT | Status: DC
  Administered 2013-12-03 – 2013-12-06 (×6): 2 via RESPIRATORY_TRACT
  Filled 2013-12-03: qty 6

## 2013-12-03 MED ORDER — SODIUM CHLORIDE 0.9 % IJ SOLN: 3.0000 mL | INTRAMUSCULAR | Status: DC | PRN

## 2013-12-03 MED ORDER — PANTOPRAZOLE SODIUM 40 MG PO TBEC
40.0000 mg | DELAYED_RELEASE_TABLET | Freq: Every day | ORAL | Status: DC
  Administered 2013-12-04 – 2013-12-06 (×3): 40 mg via ORAL
  Filled 2013-12-03 (×3): qty 1

## 2013-12-03 MED ORDER — ASPIRIN EC 81 MG PO TBEC
81.0000 mg | DELAYED_RELEASE_TABLET | Freq: Every day | ORAL | Status: DC
  Administered 2013-12-03 – 2013-12-06 (×4): 81 mg via ORAL
  Filled 2013-12-03 (×4): qty 1

## 2013-12-03 MED ORDER — ACETAMINOPHEN 325 MG PO TABS
650.0000 mg | ORAL_TABLET | Freq: Four times a day (QID) | ORAL | Status: DC | PRN
  Administered 2013-12-03 – 2013-12-05 (×3): 650 mg via ORAL
  Filled 2013-12-03 (×4): qty 2

## 2013-12-03 MED ORDER — PANTOPRAZOLE SODIUM 20 MG PO TBEC
20.0000 mg | DELAYED_RELEASE_TABLET | Freq: Every day | ORAL | Status: DC
  Filled 2013-12-03 (×3): qty 1

## 2013-12-03 MED ORDER — DULOXETINE HCL 30 MG PO CPEP
30.0000 mg | ORAL_CAPSULE | Freq: Every day | ORAL | Status: DC
  Administered 2013-12-03 – 2013-12-06 (×4): 30 mg via ORAL
  Filled 2013-12-03 (×4): qty 1

## 2013-12-03 MED ORDER — DEXTROSE 5 % IV SOLN
1.0000 g | Freq: Three times a day (TID) | INTRAVENOUS | Status: DC
  Administered 2013-12-03 – 2013-12-06 (×9): 1 g via INTRAVENOUS
  Filled 2013-12-03 (×13): qty 1

## 2013-12-03 MED ORDER — TIOTROPIUM BROMIDE MONOHYDRATE 18 MCG IN CAPS
18.0000 ug | ORAL_CAPSULE | Freq: Every day | RESPIRATORY_TRACT | Status: DC
  Administered 2013-12-05 – 2013-12-06 (×2): 18 ug via RESPIRATORY_TRACT
  Filled 2013-12-03: qty 5

## 2013-12-03 NOTE — Progress Notes (Signed)
Utilization review Completed Alonda Weaber RN BSN   

## 2013-12-03 NOTE — ED Provider Notes (Signed)
CSN: [id number]     Arrival date & time 12/03/13  8250 History   First MD Initiated Contact with Patient 12/03/13 1010     Chief Complaint  Patient presents with  . Shortness of Breath     (Consider location/radiation/quality/duration/timing/severity/associated sxs/prior Treatment) Patient is a 80 y.o. male presenting with shortness of breath. The history is provided by the patient (the pt complains of a cough and sob).  Shortness of Breath Severity:  Moderate Onset quality:  Gradual Timing:  Constant Progression:  Waxing and waning Chronicity:  Recurrent Context: activity   Associated symptoms: wheezing   Associated symptoms: no abdominal pain, no chest pain, no cough, no headaches and no rash     Past Medical History  Diagnosis Date  . Hypertension   . Hyperlipidemia   . Adenocarcinoma 2003    COLON  . PVD (peripheral vascular disease)     ABI 0.78 RIGHT AND 0.80 LEFT 4/11  . Osteoarthritis     OF KNEES  . Kidney stone   . Schatzki's ring 08/05/10    egd with Dr. Gala Romney  . Hiatal hernia 08/05/10    egd with Dr. Gala Romney  . Esophageal dysmotility   . Microscopic colitis   . Internal hemorrhoids   . Diverticula of colon   . CHF (congestive heart failure)   . Coronary artery stenosis     a. 06/2011 Cath: LM 20, LAD min irregs, D1 95 - small,  LCX 60p, RCA 34m, 50d, PLA 50ost;  b. 06/2011 PCI/Rota  RCA  -> 3.0x48mm Promus Element DES  . Angina   . COPD (chronic obstructive pulmonary disease)   . Asthma   . Pneumonia ~ 2005; 09/2005  . Shortness of breath 07/17/11    "laying down"  . Type II diabetes mellitus   . GERD (gastroesophageal reflux disease)   . Anxiety   . Ischemic cardiomyopathy     a. 05/2011 Echo: EF 45-50%, inf/post HK  . H. pylori infection 03/01/12    treated with prevpac  . Tubular adenoma   . Hyperplastic polyp of intestine    Past Surgical History  Procedure Laterality Date  . Hemicolectomy  2003  . Appendectomy    . Colonoscopy  04/2001    Dr.  Laural Golden- colon carcinoma  . Esophagogastroduodenoscopy  04/2001    Dr. Laural Golden  . Esophagogastroduodenoscopy  08/05/10    Dr. Evalee Mutton ring, hiatal hernia  . Colonoscopy  09/04/09    Dr. Matthew Folks, hemorrhoids  . Atherectomy  07/17/11  . Coronary angioplasty  07/17/11    rotablator  . Colon surgery    . Cataract extraction, bilateral    . Esophagogastroduodenoscopy  03/01/2012    Dr. Gala Romney- gastric erosions, small hiatal hernia, +hpylori,=treated with prevpac  . Colonoscopy  03/11/2012    Dr. Sabino Gasser R hemicolectomy, pancolonic diverticulosis, tubular adenoma, hyperplastic polyp  . Esophagogastroduodenoscopy N/A 08/11/2013    [id number] Schatzki's ring- s/p dilation as described above. Some retained gastric contents-query delay in gastric emptying. Abnormal gastric mucosa- s/p gastric biopsy  . Savory dilation N/A 08/11/2013    Procedure: SAVORY DILATION;  Surgeon: Daneil Dolin, MD;  Location: [facility];  Service: Endoscopy;  Laterality: N/A;  Venia Minks dilation N/A 08/11/2013    Procedure: Venia Minks DILATION;  Surgeon: Daneil Dolin, MD;  Location: [facility];  Service: Endoscopy;  Laterality: N/A;   Family History  Problem Relation Age of Onset  . Coronary artery disease Father   . Coronary artery disease Brother  CABG  . Diabetes Brother   . Coronary artery disease Brother   . Heart Problems Mother    History  Substance Use Topics  . Smoking status: Former Smoker -- 20 years    Types: Cigarettes    Quit date: 02/25/2008  . Smokeless tobacco: Never Used  . Alcohol Use: No    Review of Systems  Constitutional: Negative for appetite change and fatigue.  HENT: Negative for congestion, ear discharge and sinus pressure.   Eyes: Negative for discharge.  Respiratory: Positive for shortness of breath and wheezing. Negative for cough.   Cardiovascular: Negative for chest pain.  Gastrointestinal: Negative for abdominal pain and diarrhea.  Genitourinary: Negative  for frequency and hematuria.  Musculoskeletal: Negative for back pain.  Skin: Negative for rash.  Neurological: Negative for seizures and headaches.  Psychiatric/Behavioral: Negative for hallucinations.      Allergies  Review of patient's allergies indicates no known allergies.  Home Medications   Prior to Admission medications   Medication Sig Start Date End Date Taking? Authorizing Provider  albuterol (PROVENTIL) (2.5 MG/3ML) 0.083% nebulizer solution Take 2.5 mg by nebulization 4 (four) times daily.   Yes Historical Provider, MD  budesonide-formoterol (SYMBICORT) 160-4.5 MCG/ACT inhaler Inhale 2 puffs into the lungs 2 (two) times daily.   Yes Historical Provider, MD  carvedilol (COREG) 3.125 MG tablet Take 3.125 mg by mouth 2 (two) times daily with a meal.  04/28/13  Yes Historical Provider, MD  Cinnamon 500 MG TABS Take 500 mg by mouth daily.   Yes Historical Provider, MD  clopidogrel (PLAVIX) 75 MG tablet Take 1 tablet (75 mg total) by mouth daily. 12/16/11  Yes Burnell Blanks, MD  DULoxetine (CYMBALTA) 30 MG capsule Take 30 mg by mouth daily.   Yes Historical Provider, MD  ferrous sulfate 325 (65 FE) MG EC tablet Take 325 mg by mouth 2 (two) times daily with a meal.   Yes Historical Provider, MD  insulin aspart (NOVOLOG) 100 UNIT/ML injection Inject 5 Units into the skin 3 (three) times daily before meals.   Yes Historical Provider, MD  insulin glargine (LANTUS) 100 UNIT/ML injection Inject 15 Units into the skin at bedtime.   Yes Historical Provider, MD  lansoprazole (PREVACID) 30 MG capsule Take 1 capsule (30 mg total) by mouth daily before breakfast. 11/16/12  Yes Mahala Menghini, PA-C  loperamide (IMODIUM A-D) 2 MG tablet Take 2 mg by mouth daily as needed for diarrhea or loose stools.    Yes Historical Provider, MD  LORazepam (ATIVAN) 0.5 MG tablet Take 0.5 mg by mouth every 4 (four) hours as needed.  01/24/13  Yes Historical Provider, MD  meclizine (ANTIVERT) 12.5 MG tablet  Take 12.5 mg by mouth 3 (three) times daily.   Yes Historical Provider, MD  polyethylene glycol (MIRALAX / GLYCOLAX) packet Take 17 g by mouth daily.   Yes Historical Provider, MD  potassium chloride SA (K-DUR,KLOR-CON) 20 MEQ tablet Take 20 mEq by mouth daily. 07/20/13  Yes Historical Provider, MD  Propylene Glycol (SYSTANE BALANCE) 0.6 % SOLN Apply 1 drop to eye 2 (two) times daily.   Yes Historical Provider, MD  simvastatin (ZOCOR) 40 MG tablet Take 40 mg by mouth every evening.   Yes Historical Provider, MD  temazepam (RESTORIL) 15 MG capsule Take 15 mg by mouth at bedtime as needed for sleep.   Yes Historical Provider, MD  tiotropium (SPIRIVA) 18 MCG inhalation capsule Place 18 mcg into inhaler and inhale daily.   Yes Historical  Provider, MD  torsemide (DEMADEX) 20 MG tablet Take 2 tablets (40 mg total) by mouth daily. 05/27/13  Yes Lendon Colonel, NP  traMADol (ULTRAM) 50 MG tablet Take 50 mg by mouth every 6 (six) hours as needed.   Yes Historical Provider, MD   BP 124/77  Pulse 89  Temp(Src) 99.2 F (37.3 C) (Oral)  Resp 21  Ht 5\' 8"  (1.727 m)  Wt 207 lb (93.895 kg)  BMI 31.48 kg/m2  SpO2 91% Physical Exam  Constitutional: He is oriented to person, place, and time. He appears well-developed.  HENT:  Head: Normocephalic.  Eyes: Conjunctivae and EOM are normal. No scleral icterus.  Neck: Neck supple. No thyromegaly present.  Cardiovascular: Normal rate and regular rhythm.  Exam reveals no gallop and no friction rub.   No murmur heard. Pulmonary/Chest: No stridor. He has wheezes. He has no rales. He exhibits no tenderness.  Abdominal: He exhibits no distension. There is no tenderness. There is no rebound.  Musculoskeletal: Normal range of motion. He exhibits no edema.  Lymphadenopathy:    He has no cervical adenopathy.  Neurological: He is oriented to person, place, and time. He exhibits normal muscle tone. Coordination normal.  Skin: No rash noted. No erythema.  Psychiatric:  He has a normal mood and affect. His behavior is normal.    ED Course  Procedures (including critical care time) Labs Review Labs Reviewed  BASIC METABOLIC PANEL - Abnormal; Notable for the following:    CO2 33 (*)    Glucose, Bld 200 (*)    GFR calc non Af Amer 74 (*)    GFR calc Af Amer 86 (*)    All other components within normal limits  CBC - Abnormal; Notable for the following:    WBC 15.0 (*)    RBC 3.15 (*)    Hemoglobin 10.5 (*)    HCT 33.0 (*)    MCV 104.8 (*)    All other components within normal limits  PRO B NATRIURETIC PEPTIDE - Abnormal; Notable for the following:    Pro B Natriuretic peptide (BNP) 2384.0 (*)    All other components within normal limits  HEPATIC FUNCTION PANEL - Abnormal; Notable for the following:    Albumin 3.1 (*)    All other components within normal limits  TROPONIN I    Imaging Review Dg Chest Portable 1 View  12/03/2013   CLINICAL DATA:  Acute onset of shortness of breath approximately 4 hr ago. Lethargy. Acute mental status changes with confusion. Current history of ischemic cardiomyopathy with prior history of CHF.  EXAM: PORTABLE CHEST - 1 VIEW  COMPARISON:  Portable chest x-ray 05/10/2013, 08/17/2012. Two-view chest x-ray 05/09/2012.  FINDINGS: Cardiac silhouette markedly enlarged but stable. Thoracic aorta atherosclerotic and mildly tortuous, unchanged. Hilar and mediastinal contours otherwise unremarkable. Pulmonary venous hypertension without overt edema. Streaky and patchy airspace opacities at the right lung base. Lungs otherwise clear. No localized airspace consolidation. No visible pleural effusions. No pneumothorax.  IMPRESSION: 1. Bronchopneumonia involving the right lung base. 2. Stable marked cardiomegaly. Pulmonary venous hypertension without overt edema.   Electronically Signed   By: Evangeline Dakin M.D.   On: 12/03/2013 11:13     EKG Interpretation None      Date: 12/03/2013  Rate: 82  Rhythm: normal sinus rhythm  QRS  Axis: normal  Intervals: normal  ST/T Wave abnormalities: nonspecific ST changes  Conduction Disutrbances:none  Narrative Interpretation:   Old EKG Reviewed: none available    MDM  Final diagnoses:  Healthcare-associated pneumonia        Maudry Diego, MD 12/03/13 1141

## 2013-12-03 NOTE — H&P (Signed)
[facility]          History and Physical    PCP:   Cyndee Brightly, MD   Chief Complaint:  Shortness of breath, confusion  HPI: Patient is an 80 year old gentleman with multiple medical comorbidities including coronary artery disease, ischemic cardiomyopathy with diastolic congestive heart failure, diabetes, hypertension, hyperlipidemia, oxygen-dependent COPD who was brought to the hospital today from his nursing home secondary to complaints of shortness of breath. Patient's nurse at nursing facility also stated that he was confused and "talking out of his head". In the emergency department he is found to have a right lung base bronchopneumonia without overt edema. He is requiring 3 L of oxygen when he typically only uses 2 L. Leukocytosis of 15,000. We have been asked to make him for further evaluation and management.  Allergies:  No Known Allergies    Past Medical History  Diagnosis Date  . Hypertension   . Hyperlipidemia   . Adenocarcinoma 2003    COLON  . PVD (peripheral vascular disease)     ABI 0.78 RIGHT AND 0.80 LEFT 4/11  . Osteoarthritis     OF KNEES  . Kidney stone   . Schatzki's ring 08/05/10    egd with Dr. Gala Romney  . Hiatal hernia 08/05/10    egd with Dr. Gala Romney  . Esophageal dysmotility   . Microscopic colitis   . Internal hemorrhoids   . Diverticula of colon   . CHF (congestive heart failure)   . Coronary artery stenosis     a. 06/2011 Cath: LM 20, LAD min irregs, D1 95 - small,  LCX 60p, RCA 36m 50d, PLA 50ost;  b. 06/2011 PCI/Rota  RCA  -> 3.0x232mPromus Element DES  . Angina   . COPD (chronic obstructive pulmonary disease)   . Asthma   . Pneumonia ~ 2005; 09/2005  . Shortness of breath 07/17/11    "laying down"  . Type II diabetes mellitus   . GERD (gastroesophageal reflux disease)   . Anxiety   . Ischemic cardiomyopathy     a. 05/2011 Echo: EF 45-50%, inf/post HK  . H. pylori infection 03/01/12    treated with prevpac  .  Tubular adenoma   . Hyperplastic polyp of intestine     Past Surgical History  Procedure Laterality Date  . Hemicolectomy  2003  . Appendectomy    . Colonoscopy  04/2001    Dr. ReLaural Goldencolon carcinoma  . Esophagogastroduodenoscopy  04/2001    Dr. ReLaural Golden. Esophagogastroduodenoscopy  08/05/10    Dr. RoEvalee Muttoning, hiatal hernia  . Colonoscopy  09/04/09    Dr. RoMatthew Folkshemorrhoids  . Atherectomy  07/17/11  . Coronary angioplasty  07/17/11    rotablator  . Colon surgery    . Cataract extraction, bilateral    . Esophagogastroduodenoscopy  03/01/2012    Dr. RoGala Romneygastric erosions, small hiatal hernia, +hpylori,=treated with prevpac  . Colonoscopy  03/11/2012    Dr. RoSabino Gasser hemicolectomy, pancolonic diverticulosis, tubular adenoma, hyperplastic polyp  . Esophagogastroduodenoscopy N/A 08/11/2013    RMZGY:FVCBSWchatzki's ring- s/p dilation as described above. Some retained gastric contents-query delay in gastric emptying. Abnormal gastric mucosa- s/p gastric biopsy  . Savory dilation N/A 08/11/2013    Procedure: SAVORY DILATION;  Surgeon: RoDaneil DolinMD;  Location: [facility];  Service: Endoscopy;  Laterality: N/A;  . Venia Minksilation N/A 08/11/2013    Procedure: MALONEY DILATION;  Surgeon: Daneil Dolin, MD;  Location: [facility];  Service: Endoscopy;  Laterality: N/A;    Prior to Admission medications   Medication Sig Start Date End Date Taking? Authorizing Provider  albuterol (PROVENTIL) (2.5 MG/3ML) 0.083% nebulizer solution Take 2.5 mg by nebulization 4 (four) times daily.   Yes Historical Provider, MD  budesonide-formoterol (SYMBICORT) 160-4.5 MCG/ACT inhaler Inhale 2 puffs into the lungs 2 (two) times daily.   Yes Historical Provider, MD  carvedilol (COREG) 3.125 MG tablet Take 3.125 mg by mouth 2 (two) times daily with a meal.  04/28/13  Yes Historical Provider, MD  Cinnamon 500 MG TABS Take 500 mg by mouth daily.   Yes Historical Provider, MD  clopidogrel  (PLAVIX) 75 MG tablet Take 1 tablet (75 mg total) by mouth daily. 12/16/11  Yes Burnell Blanks, MD  DULoxetine (CYMBALTA) 30 MG capsule Take 30 mg by mouth daily.   Yes Historical Provider, MD  ferrous sulfate 325 (65 FE) MG EC tablet Take 325 mg by mouth 2 (two) times daily with a meal.   Yes Historical Provider, MD  insulin aspart (NOVOLOG) 100 UNIT/ML injection Inject 5 Units into the skin 3 (three) times daily before meals.   Yes Historical Provider, MD  insulin glargine (LANTUS) 100 UNIT/ML injection Inject 15 Units into the skin at bedtime.   Yes Historical Provider, MD  lansoprazole (PREVACID) 30 MG capsule Take 1 capsule (30 mg total) by mouth daily before breakfast. 11/16/12  Yes Mahala Menghini, PA-C  loperamide (IMODIUM A-D) 2 MG tablet Take 2 mg by mouth daily as needed for diarrhea or loose stools.    Yes Historical Provider, MD  LORazepam (ATIVAN) 0.5 MG tablet Take 0.5 mg by mouth every 4 (four) hours as needed.  01/24/13  Yes Historical Provider, MD  meclizine (ANTIVERT) 12.5 MG tablet Take 12.5 mg by mouth 3 (three) times daily.   Yes Historical Provider, MD  polyethylene glycol (MIRALAX / GLYCOLAX) packet Take 17 g by mouth daily.   Yes Historical Provider, MD  potassium chloride SA (K-DUR,KLOR-CON) 20 MEQ tablet Take 20 mEq by mouth daily. 07/20/13  Yes Historical Provider, MD  Propylene Glycol (SYSTANE BALANCE) 0.6 % SOLN Apply 1 drop to eye 2 (two) times daily.   Yes Historical Provider, MD  simvastatin (ZOCOR) 40 MG tablet Take 40 mg by mouth every evening.   Yes Historical Provider, MD  temazepam (RESTORIL) 15 MG capsule Take 15 mg by mouth at bedtime as needed for sleep.   Yes Historical Provider, MD  tiotropium (SPIRIVA) 18 MCG inhalation capsule Place 18 mcg into inhaler and inhale daily.   Yes Historical Provider, MD  torsemide (DEMADEX) 20 MG tablet Take 2 tablets (40 mg total) by mouth daily. 05/27/13  Yes Lendon Colonel, NP  traMADol (ULTRAM) 50 MG tablet Take 50  mg by mouth every 6 (six) hours as needed.   Yes Historical Provider, MD    Social History:  reports that he quit smoking about 5 years ago. His smoking use included Cigarettes. He smoked 0.00 packs per day for 20 years. He has never used smokeless tobacco. He reports that he does not drink alcohol or use illicit drugs.  Family History  Problem Relation Age of Onset  . Coronary artery disease Father   . Coronary artery disease Brother     CABG  . Diabetes Brother   . Coronary artery disease Brother   . Heart Problems Mother     Review of Systems:  Constitutional: Denies fever, chills, diaphoresis, appetite change and fatigue.  HEENT: Denies photophobia, eye pain, redness, hearing loss, ear pain, congestion, sore throat, rhinorrhea, sneezing, mouth sores, trouble swallowing, neck pain, neck stiffness and tinnitus.   Respiratory: Denies  chest tightness,  and wheezing.   Cardiovascular: Denies chest pain, palpitations and leg swelling.  Gastrointestinal: Denies nausea, vomiting, abdominal pain, diarrhea, constipation, blood in stool and abdominal distention.  Genitourinary: Denies dysuria, urgency, frequency, hematuria, flank pain and difficulty urinating.  Endocrine: Denies: hot or cold intolerance, sweats, changes in hair or nails, polyuria, polydipsia. Musculoskeletal: Denies myalgias, back pain, joint swelling, arthralgias and gait problem.  Skin: Denies pallor, rash and wound.  Neurological: Denies dizziness, seizures, syncope, weakness, light-headedness, numbness and headaches.  Hematological: Denies adenopathy. Easy bruising, personal or family bleeding history  Psychiatric/Behavioral: Denies suicidal ideation, mood changes, confusion, nervousness, sleep disturbance and agitation   Physical Exam: Blood pressure 112/45, pulse 78, temperature 98.5 F (36.9 C), temperature source Oral, resp. rate 24, height '5\' 8"'  (1.727 m), weight 93.895 kg (207 lb), SpO2 98.00%. General: Alert,  awake, oriented x3, obese HEENT: Normocephalic, atraumatic, pupils are unreactive to light, wears corrective lenses Neck: Supple, no JVD, no lymphadenopathy, no bruits, no goiter.  Cardiovascular: Regular rate and rhythm, no murmurs rubs or gallops auscultated Lungs: Right basilar rhonchi, no wheezes Abdomen: Obese, soft, nontender, positive bowel sounds Extremities: 1+ pitting edema bilaterally Neurologic: Grossly intact and nonfocal  Labs on Admission:  Results for orders placed during the hospital encounter of 12/03/13 (from the past 48 hour(s))  BASIC METABOLIC PANEL     Status: Abnormal   Collection Time    12/03/13 10:24 AM      Result Value Ref Range   Sodium 140  137 - 147 mEq/L   Potassium 5.1  3.7 - 5.3 mEq/L   Chloride 97  96 - 112 mEq/L   CO2 33 (*) 19 - 32 mEq/L   Glucose, Bld 200 (*) 70 - 99 mg/dL   BUN 17  6 - 23 mg/dL   Creatinine, Ser 0.98  0.50 - 1.35 mg/dL   Calcium 9.5  8.4 - 10.5 mg/dL   GFR calc non Af Amer 74 (*) >90 mL/min   GFR calc Af Amer 86 (*) >90 mL/min   Comment: (NOTE)     The eGFR has been calculated using the CKD EPI equation.     This calculation has not been validated in all clinical situations.     eGFR's persistently <90 mL/min signify possible Chronic Kidney     Disease.   Anion gap 10  5 - 15  CBC     Status: Abnormal   Collection Time    12/03/13 10:24 AM      Result Value Ref Range   WBC 15.0 (*) 4.0 - 10.5 K/uL   RBC 3.15 (*) 4.22 - 5.81 MIL/uL   Hemoglobin 10.5 (*) 13.0 - 17.0 g/dL   HCT 33.0 (*) 39.0 - 52.0 %   MCV 104.8 (*) 78.0 - 100.0 fL   MCH 33.3  26.0 - 34.0 pg   MCHC 31.8  30.0 - 36.0 g/dL   RDW 14.1  11.5 - 15.5 %   Platelets 236  150 - 400 K/uL  TROPONIN I     Status: None   Collection Time    12/03/13 10:24 AM      Result Value Ref Range   Troponin I <0.30  <0.30 ng/mL   Comment:  Due to the release kinetics of cTnI,     a negative result within the first hours     of the onset of symptoms does not  rule out     myocardial infarction with certainty.     If myocardial infarction is still suspected,     repeat the test at appropriate intervals.  PRO B NATRIURETIC PEPTIDE     Status: Abnormal   Collection Time    12/03/13 10:24 AM      Result Value Ref Range   Pro B Natriuretic peptide (BNP) 2384.0 (*) 0 - 450 pg/mL  HEPATIC FUNCTION PANEL     Status: Abnormal   Collection Time    12/03/13 10:24 AM      Result Value Ref Range   Total Protein 7.0  6.0 - 8.3 g/dL   Albumin 3.1 (*) 3.5 - 5.2 g/dL   AST 12  0 - 37 U/L   ALT 9  0 - 53 U/L   Alkaline Phosphatase 73  39 - 117 U/L   Total Bilirubin 0.8  0.3 - 1.2 mg/dL   Bilirubin, Direct <0.2  0.0 - 0.3 mg/dL   Indirect Bilirubin NOT CALCULATED  0.3 - 0.9 mg/dL    Radiological Exams on Admission: Dg Chest Portable 1 View  12/03/2013   CLINICAL DATA:  Acute onset of shortness of breath approximately 4 hr ago. Lethargy. Acute mental status changes with confusion. Current history of ischemic cardiomyopathy with prior history of CHF.  EXAM: PORTABLE CHEST - 1 VIEW  COMPARISON:  Portable chest x-ray 05/10/2013, 08/17/2012. Two-view chest x-ray 05/09/2012.  FINDINGS: Cardiac silhouette markedly enlarged but stable. Thoracic aorta atherosclerotic and mildly tortuous, unchanged. Hilar and mediastinal contours otherwise unremarkable. Pulmonary venous hypertension without overt edema. Streaky and patchy airspace opacities at the right lung base. Lungs otherwise clear. No localized airspace consolidation. No visible pleural effusions. No pneumothorax.  IMPRESSION: 1. Bronchopneumonia involving the right lung base. 2. Stable marked cardiomegaly. Pulmonary venous hypertension without overt edema.   Electronically Signed   By: Evangeline Dakin M.D.   On: 12/03/2013 11:13    Assessment/Plan Principal Problem:   HCAP (healthcare-associated pneumonia) Active Problems:   CAD (coronary artery disease)   Hypertension   Hyperlipidemia   Type II diabetes  mellitus   Acute on chronic respiratory failure   Toxic metabolic encephalopathy   Chronic diastolic CHF (congestive heart failure)   Leukocytosis    Acute on chronic respiratory failure  -secondary to hospital-acquired pneumonia. -Is requiring more oxygen than his baseline. We'll wean as tolerated.  Hospital acquired pneumonia -Blood/sputum cultures. -Legionella/strep pneumo urinary antigens. -Check influenza PCR. -Start vancomycin/cefepime given he resides at a skilled nursing facility.  Toxic metabolic encephalopathy -Likely related to hypoxemia and acute infectious process. -Her son has improved significantly since this morning already.  Leukocytosis -Secondary to acute pneumonia. -Follow trend with antibiotics.  Chronic diastolic CHF -Compensated.  Type 2 diabetes -Check hemoglobin A1c. -Continue basal insulin 15 units. -Moderate sliding scale insulin.  Hyperlipidemia -Continue statin  Hypertension -Continue home blood pressure medications. -Well controlled at present.  DVT prophylaxis -Lovenox.  CODE STATUS -Full code   Time Spent on Admission: 75 minutes  HERNANDEZ ACOSTA,ESTELA [facility] Pager: [phone] 12/03/2013, 3:25 PM

## 2013-12-03 NOTE — ED Notes (Signed)
Per EMS patient was found by nursing home with SOB. When EMS arrived Patient was in wheelchair. 4L via [place]. Per nursing home symptoms started today at 7:20 am. Patient presents as lethargic and slightly confused. EMS gave albuterol treatment en route. Patient is presently on 2L O2 w/ clear breath sounds. Patient states that he "can't talk" and that "it'll be too late".

## 2013-12-03 NOTE — Progress Notes (Signed)
ANTIBIOTIC CONSULT NOTE  Pharmacy Consult for Vancomycin & Cefepime Indication: pneumonia  No Known Allergies  Patient Measurements: Height: 5\' 8"  (172.7 cm) Weight: 207 lb (93.895 kg) IBW/kg (Calculated) : 68.4  Vital Signs: Temp: 98.5 F (36.9 C) (10/10 1427) Temp Source: Oral (10/10 1427) BP: 112/45 mmHg (10/10 1427) Pulse Rate: 78 (10/10 1427) Intake/Output from previous day:   Intake/Output from this shift:    Labs:  Recent Labs  12/03/13 1024  WBC 15.0*  HGB 10.5*  PLT 236  CREATININE 0.98   Estimated Creatinine Clearance: 64.6 ml/min (by C-G formula based on Cr of 0.98). No results found for this basename: VANCOTROUGH, VANCOPEAK, VANCORANDOM, GENTTROUGH, GENTPEAK, GENTRANDOM, TOBRATROUGH, TOBRAPEAK, TOBRARND, AMIKACINPEAK, AMIKACINTROU, AMIKACIN,  in the last 72 hours   Microbiology: No results found for this or any previous visit (from the past 720 hour(s)).  Anti-infectives   Start     Dose/Rate Route Frequency Ordered Stop   12/03/13 1600  ceFEPIme (MAXIPIME) 1 g in dextrose 5 % 50 mL IVPB     1 g 100 mL/hr over 30 Minutes Intravenous 3 times per day 12/03/13 1524 12/11/13 1359   12/03/13 1300  vancomycin (VANCOCIN) IVPB 1000 mg/200 mL premix  Status:  Discontinued     1,000 mg 200 mL/hr over 60 Minutes Intravenous  Once 12/03/13 1225 12/03/13 1524   12/03/13 1130  vancomycin (VANCOCIN) IVPB 1000 mg/200 mL premix  Status:  Discontinued     1,000 mg 200 mL/hr over 60 Minutes Intravenous  Once 12/03/13 1126 12/03/13 1225   12/03/13 1130  piperacillin-tazobactam (ZOSYN) IVPB 3.375 g     3.375 g 100 mL/hr over 30 Minutes Intravenous  Once 12/03/13 1126 12/03/13 1212      Assessment: 84 yoM admitted from nursing facility with SOB and confusion.  CXR + right bronchopneumonia.   He is afebrile with elevated WBC.  Renal function is at patient's baseline.   Cefepime 10/10>> Vancomycin 10/10>>  Goal of Therapy:  Vancomycin trough level 15-20  mcg/ml  Plan:  Continue Cefepime 1gm IV q8h Vancomycin 1gm IV q12h Check Vancomycin trough at steady state Monitor renal function and cx data   Biagio Borg 12/03/2013,4:39 PM

## 2013-12-04 LAB — CBC
HEMATOCRIT: 29.1 % — AB (ref 39.0–52.0)
HEMOGLOBIN: 9.6 g/dL — AB (ref 13.0–17.0)
MCH: 33.1 pg (ref 26.0–34.0)
MCHC: 33 g/dL (ref 30.0–36.0)
MCV: 100.3 fL — ABNORMAL HIGH (ref 78.0–100.0)
Platelets: 222 10*3/uL (ref 150–400)
RBC: 2.9 MIL/uL — ABNORMAL LOW (ref 4.22–5.81)
RDW: 14.4 % (ref 11.5–15.5)
WBC: 11.1 10*3/uL — ABNORMAL HIGH (ref 4.0–10.5)

## 2013-12-04 LAB — BASIC METABOLIC PANEL
Anion gap: 12 (ref 5–15)
BUN: 20 mg/dL (ref 6–23)
CALCIUM: 8.5 mg/dL (ref 8.4–10.5)
CHLORIDE: 94 meq/L — AB (ref 96–112)
CO2: 31 meq/L (ref 19–32)
Creatinine, Ser: 1 mg/dL (ref 0.50–1.35)
GFR calc Af Amer: 79 mL/min — ABNORMAL LOW (ref >90)
GFR calc non Af Amer: 68 mL/min — ABNORMAL LOW (ref >90)
Glucose, Bld: 187 mg/dL — ABNORMAL HIGH (ref 70–99)
Potassium: 3.6 mEq/L — ABNORMAL LOW (ref 3.7–5.3)
Sodium: 137 mEq/L (ref 137–147)

## 2013-12-04 LAB — GLUCOSE, CAPILLARY
GLUCOSE-CAPILLARY: 159 mg/dL — AB (ref 70–99)
GLUCOSE-CAPILLARY: 188 mg/dL — AB (ref 70–99)
Glucose-Capillary: 194 mg/dL — ABNORMAL HIGH (ref 70–99)
Glucose-Capillary: 168 mg/dL — ABNORMAL HIGH (ref 70–99)

## 2013-12-04 LAB — STREP PNEUMONIAE URINARY ANTIGEN: Strep Pneumo Urinary Antigen: NEGATIVE

## 2013-12-04 LAB — HIV ANTIBODY (ROUTINE TESTING W REFLEX): HIV: NONREACTIVE

## 2013-12-04 LAB — HEMOGLOBIN A1C
Hgb A1c MFr Bld: 7.5 % — ABNORMAL HIGH (ref <5.7)
MEAN PLASMA GLUCOSE: 169 mg/dL — AB (ref <117)

## 2013-12-04 MED ORDER — POTASSIUM CHLORIDE CRYS ER 20 MEQ PO TBCR
40.0000 meq | EXTENDED_RELEASE_TABLET | Freq: Once | ORAL | Status: AC
  Administered 2013-12-04: 40 meq via ORAL
  Filled 2013-12-04: qty 2

## 2013-12-04 NOTE — Progress Notes (Signed)
[facility] PROGRESS NOTE  Howard [name] [id number]:[id number] DOB: 11/16/1929 DOA: 12/03/2013 PCP: Cyndee Brightly, MD  Assessment/Plan: Acute hypoxemic respiratory failure -Secondary to pneumonia. -Continue to wean oxygen as tolerated.  Hospital-acquired pneumonia -Continue vancomycin/cefepime. -Culture data is pending.  Toxic metabolic encephalopathy -Improving. -Secondary to pneumonia and hypoxemia.  Leukocytosis -Secondary to pneumonia. -Improving on antibiotics.  Chronic diastolic CHF -Compensated  Type 2 diabetes mellitus -Fair control   Hypertension -Well controlled  Hyperlipidemia -Continue statin  Code Status: Full control Family Communication: Patient only  Disposition Plan: Back to SNF when ready   Consultants:  None   Antibiotics:  Vancomycin  Cefepime   Subjective: No complaints  Objective: Filed Vitals:   12/03/13 2059 12/04/13 0605 12/04/13 0851 12/04/13 1312  BP:  128/51    Pulse:  76    Temp:  97.8 F (36.6 C)    TempSrc:  Oral    Resp:  20    Height:      Weight:      SpO2: 95% 100% 93% 99%    Intake/Output Summary (Last 24 hours) at 12/04/13 1617 Last data filed at 12/04/13 1452  Gross per 24 hour  Intake    790 ml  Output   2325 ml  Net  -1535 ml   Filed Weights   12/03/13 1002  Weight: 93.895 kg (207 lb)    Exam:   General:  Alert, awake  Cardiovascular: Regular rate and rhythm  Respiratory: Clear to auscultation bilaterally  Abdomen: Obese, soft, nontender, nondistended  Extremities: Trace bilateral pitting edema   Neurologic:  Nonfocal  Data Reviewed: Basic Metabolic Panel:  Recent Labs Lab 12/03/13 1024 12/04/13 0616  NA 140 137  K 5.1 3.6*  CL 97 94*  CO2 33* 31  GLUCOSE 200* 187*  BUN 17 20  CREATININE 0.98 1.00  CALCIUM 9.5 8.5   Liver Function Tests:  Recent Labs Lab 12/03/13 1024  AST 12  ALT 9  ALKPHOS 73  BILITOT 0.8  PROT 7.0  ALBUMIN 3.1*   No results  found for this basename: LIPASE, AMYLASE,  in the last 168 hours No results found for this basename: AMMONIA,  in the last 168 hours CBC:  Recent Labs Lab 12/03/13 1024 12/04/13 0616  WBC 15.0* 11.1*  HGB 10.5* 9.6*  HCT 33.0* 29.1*  MCV 104.8* 100.3*  PLT 236 222   Cardiac Enzymes:  Recent Labs Lab 12/03/13 1024  TROPONINI <0.30   BNP (last 3 results)  Recent Labs  12/03/13 1024  PROBNP 2384.0*   CBG:  Recent Labs Lab 12/03/13 1634 12/03/13 2050 12/04/13 0739 12/04/13 1156  GLUCAP 216* 129* 194* 168*    Recent Results (from the past 240 hour(s))  CULTURE, BLOOD (ROUTINE X 2)     Status: None   Collection Time    12/03/13  4:08 PM      Result Value Ref Range Status   Specimen Description BLOOD RIGHT ARM   Final   Special Requests BOTTLES DRAWN AEROBIC AND ANAEROBIC 8CC BOTTLES   Final   Culture PENDING   Incomplete   Report Status PENDING   Incomplete  CULTURE, BLOOD (ROUTINE X 2)     Status: None   Collection Time    12/03/13  4:10 PM      Result Value Ref Range Status   Specimen Description BLOOD RIGHT HAND   Final   Special Requests BOTTLES DRAWN AEROBIC ONLY [place]   Final   Culture PENDING  Incomplete   Report Status PENDING   Incomplete     Studies: Dg Chest Portable 1 View  12/03/2013   CLINICAL DATA:  Acute onset of shortness of breath approximately 4 hr ago. Lethargy. Acute mental status changes with confusion. Current history of ischemic cardiomyopathy with prior history of CHF.  EXAM: PORTABLE CHEST - 1 VIEW  COMPARISON:  Portable chest x-ray 05/10/2013, 08/17/2012. Two-view chest x-ray 05/09/2012.  FINDINGS: Cardiac silhouette markedly enlarged but stable. Thoracic aorta atherosclerotic and mildly tortuous, unchanged. Hilar and mediastinal contours otherwise unremarkable. Pulmonary venous hypertension without overt edema. Streaky and patchy airspace opacities at the right lung base. Lungs otherwise clear. No localized airspace  consolidation. No visible pleural effusions. No pneumothorax.  IMPRESSION: 1. Bronchopneumonia involving the right lung base. 2. Stable marked cardiomegaly. Pulmonary venous hypertension without overt edema.   Electronically Signed   By: Evangeline Dakin M.D.   On: 12/03/2013 11:13    Scheduled Meds: . albuterol  2.5 mg Nebulization QID  . aspirin EC  81 mg Oral Daily  . budesonide-formoterol  2 puff Inhalation BID  . carvedilol  3.125 mg Oral BID WC  . ceFEPime (MAXIPIME) IV  1 g Intravenous 3 times per day  . clopidogrel  75 mg Oral Daily  . DULoxetine  30 mg Oral Daily  . enoxaparin (LOVENOX) injection  40 mg Subcutaneous Q24H  . insulin aspart  0-15 Units Subcutaneous TID WC  . insulin aspart  4 Units Subcutaneous TID WC  . insulin glargine  15 Units Subcutaneous QHS  . meclizine  12.5 mg Oral TID  . pantoprazole  40 mg Oral Daily  . polyethylene glycol  17 g Oral Daily  . potassium chloride SA  20 mEq Oral Daily  . simvastatin  40 mg Oral QPM  . sodium chloride  3 mL Intravenous Q12H  . tiotropium  18 mcg Inhalation Daily  . torsemide  40 mg Oral Daily  . vancomycin  1,000 mg Intravenous Q12H   Continuous Infusions:   Principal Problem:   HCAP (healthcare-associated pneumonia) Active Problems:   CAD (coronary artery disease)   Hypertension   Hyperlipidemia   Type II diabetes mellitus   Acute on chronic respiratory failure   Toxic metabolic encephalopathy   Chronic diastolic CHF (congestive heart failure)   Leukocytosis    Time spent: 35 minutes. Greater than 50% of this time was spent in direct contact with the patient coordinating care.    Lelon Frohlich  [facility] Pager [phone]  If 7PM-7AM, please contact night-coverage at [email or web address], password [facility] 12/04/2013, 4:17 PM  LOS: 1 day

## 2013-12-05 LAB — CBC
HCT: 30.6 % — ABNORMAL LOW (ref 39.0–52.0)
Hemoglobin: 10 g/dL — ABNORMAL LOW (ref 13.0–17.0)
MCH: 32.8 pg (ref 26.0–34.0)
MCHC: 32.7 g/dL (ref 30.0–36.0)
MCV: 100.3 fL — ABNORMAL HIGH (ref 78.0–100.0)
Platelets: 243 10*3/uL (ref 150–400)
RBC: 3.05 MIL/uL — AB (ref 4.22–5.81)
RDW: 14.2 % (ref 11.5–15.5)
WBC: 10.2 10*3/uL (ref 4.0–10.5)

## 2013-12-05 LAB — GLUCOSE, CAPILLARY
GLUCOSE-CAPILLARY: 104 mg/dL — AB (ref 70–99)
Glucose-Capillary: 199 mg/dL — ABNORMAL HIGH (ref 70–99)
Glucose-Capillary: 255 mg/dL — ABNORMAL HIGH (ref 70–99)
Glucose-Capillary: 247 mg/dL — ABNORMAL HIGH (ref 70–99)
Glucose-Capillary: 155 mg/dL — ABNORMAL HIGH (ref 70–99)

## 2013-12-05 LAB — LEGIONELLA ANTIGEN, URINE

## 2013-12-05 LAB — BASIC METABOLIC PANEL
ANION GAP: 10 (ref 5–15)
BUN: 23 mg/dL (ref 6–23)
CHLORIDE: 94 meq/L — AB (ref 96–112)
CO2: 33 mEq/L — ABNORMAL HIGH (ref 19–32)
Calcium: 8.8 mg/dL (ref 8.4–10.5)
Creatinine, Ser: 1.16 mg/dL (ref 0.50–1.35)
GFR, EST AFRICAN AMERICAN: 66 mL/min — AB (ref >90)
GFR, EST NON AFRICAN AMERICAN: 57 mL/min — AB (ref >90)
Glucose, Bld: 139 mg/dL — ABNORMAL HIGH (ref 70–99)
Potassium: 3.4 mEq/L — ABNORMAL LOW (ref 3.7–5.3)
Sodium: 137 mEq/L (ref 137–147)

## 2013-12-05 NOTE — Progress Notes (Signed)
[facility] PROGRESS NOTE  KEY CEN DZH:[id number] DOB: 09-09-1929 DOA: 12/03/2013 PCP: Cyndee Brightly, MD  Assessment/Plan: Acute hypoxemic respiratory failure -Secondary to pneumonia. -Continue to wean oxygen as tolerated.  Hospital-acquired pneumonia -Continue vancomycin/cefepime. -Culture data is pending. -Consider transitioning to oral antibiotics in the morning.  Toxic metabolic encephalopathy -Resolved as per son at bedside. -Secondary to pneumonia and hypoxemia.  Leukocytosis -Secondary to pneumonia. -Improving on antibiotics.  Chronic diastolic CHF -Compensated  Type 2 diabetes mellitus -Fair control   Hypertension -Well controlled  Hyperlipidemia -Continue statin  Code Status: Full control Family Communication: Patient only  Disposition Plan: Back to SNF when ready; likely in a.m.   Consultants:  None   Antibiotics:  Vancomycin  Cefepime   Subjective: No complaints  Objective: Filed Vitals:   12/05/13 0628 12/05/13 0723 12/05/13 1217 12/05/13 1640  BP: 146/63   114/47  Pulse: 75   78  Temp: 98.7 F (37.1 C)   97.7 F (36.5 C)  TempSrc: Oral   Oral  Resp: 20   20  Height:      Weight:      SpO2: 97% 97% 95% 95%    Intake/Output Summary (Last 24 hours) at 12/05/13 1709 Last data filed at 12/05/13 1450  Gross per 24 hour  Intake    480 ml  Output   1500 ml  Net  -1020 ml   Filed Weights   12/03/13 1002  Weight: 93.895 kg (207 lb)    Exam:   General:  Alert, awake  Cardiovascular: Regular rate and rhythm  Respiratory: Clear to auscultation bilaterally  Abdomen: Obese, soft, nontender, nondistended  Extremities: Trace bilateral pitting edema   Neurologic:  Nonfocal  Data Reviewed: Basic Metabolic Panel:  Recent Labs Lab 12/03/13 1024 12/04/13 0616 12/05/13 0452  NA 140 137 137  K 5.1 3.6* 3.4*  CL 97 94* 94*  CO2 33* 31 33*  GLUCOSE 200* 187* 139*  BUN 17 20 23   CREATININE 0.98  1.00 1.16  CALCIUM 9.5 8.5 8.8   Liver Function Tests:  Recent Labs Lab 12/03/13 1024  AST 12  ALT 9  ALKPHOS 73  BILITOT 0.8  PROT 7.0  ALBUMIN 3.1*   No results found for this basename: LIPASE, AMYLASE,  in the last 168 hours No results found for this basename: AMMONIA,  in the last 168 hours CBC:  Recent Labs Lab 12/03/13 1024 12/04/13 0616 12/05/13 0452  WBC 15.0* 11.1* 10.2  HGB 10.5* 9.6* 10.0*  HCT 33.0* 29.1* 30.6*  MCV 104.8* 100.3* 100.3*  PLT 236 222 243   Cardiac Enzymes:  Recent Labs Lab 12/03/13 1024  TROPONINI <0.30   BNP (last 3 results)  Recent Labs  12/03/13 1024  PROBNP 2384.0*   CBG:  Recent Labs Lab 12/04/13 1702 12/04/13 2325 12/05/13 0825 12/05/13 1003 12/05/13 1158  GLUCAP 159* 188* 199* 255* 247*    Recent Results (from the past 240 hour(s))  CULTURE, BLOOD (ROUTINE X 2)     Status: None   Collection Time    12/03/13  4:08 PM      Result Value Ref Range Status   Specimen Description BLOOD RIGHT ARM   Final   Special Requests BOTTLES DRAWN AEROBIC AND ANAEROBIC 8CC BOTTLES   Final   Culture NO GROWTH 2 DAYS   Final   Report Status PENDING   Incomplete  CULTURE, BLOOD (ROUTINE X 2)     Status: None   Collection Time  12/03/13  4:10 PM      Result Value Ref Range Status   Specimen Description BLOOD RIGHT HAND   Final   Special Requests BOTTLES DRAWN AEROBIC ONLY 6CC BOTTLE   Final   Culture NO GROWTH 2 DAYS   Final   Report Status PENDING   Incomplete     Studies: No results found.  Scheduled Meds: . albuterol  2.5 mg Nebulization QID  . aspirin EC  81 mg Oral Daily  . budesonide-formoterol  2 puff Inhalation BID  . carvedilol  3.125 mg Oral BID WC  . ceFEPime (MAXIPIME) IV  1 g Intravenous 3 times per day  . clopidogrel  75 mg Oral Daily  . DULoxetine  30 mg Oral Daily  . enoxaparin (LOVENOX) injection  40 mg Subcutaneous Q24H  . insulin aspart  0-15 Units Subcutaneous TID WC  . insulin aspart  4 Units  Subcutaneous TID WC  . insulin glargine  15 Units Subcutaneous QHS  . meclizine  12.5 mg Oral TID  . pantoprazole  40 mg Oral Daily  . polyethylene glycol  17 g Oral Daily  . potassium chloride SA  20 mEq Oral Daily  . simvastatin  40 mg Oral QPM  . sodium chloride  3 mL Intravenous Q12H  . tiotropium  18 mcg Inhalation Daily  . torsemide  40 mg Oral Daily  . vancomycin  1,000 mg Intravenous Q12H   Continuous Infusions:   Principal Problem:   HCAP (healthcare-associated pneumonia) Active Problems:   CAD (coronary artery disease)   Hypertension   Hyperlipidemia   Type II diabetes mellitus   Acute on chronic respiratory failure   Toxic metabolic encephalopathy   Chronic diastolic CHF (congestive heart failure)   Leukocytosis    Time spent: 35 minutes. Greater than 50% of this time was spent in direct contact with the patient coordinating care.    Lelon Frohlich  [facility] Pager [phone]  If 7PM-7AM, please contact night-coverage at [email or web address], password [facility] 12/05/2013, 5:09 PM  LOS: 2 days

## 2013-12-05 NOTE — Plan of Care (Signed)
Problem: Phase I Progression Outcomes Goal: Voiding-avoid urinary catheter unless indicated Outcome: Completed/Met Date Met:  12/05/13 Pt is voiding without difficulty.  Uses urinal.

## 2013-12-05 NOTE — Clinical Social Work Psychosocial (Signed)
Clinical Social Work Department BRIEF PSYCHOSOCIAL ASSESSMENT 12/05/2013  Patient:  Howard [name], Howard [name]     Account Number:  [id number]     Admit date:  12/03/2013  Clinical Social Worker:  Legrand Como  Date/Time:  12/05/2013 11:54 AM  Referred by:  CSW  Date Referred:  12/05/2013 Referred for  SNF Placement   Other Referral:   Interview type:  Patient Other interview type:   Howard [name] at [facility] "Howard [name], Howard [name]    PSYCHOSOCIAL DATA Living Status:  FACILITY Admitted from facility:  [facility] Level of care:  [place] Primary support name:  Howard [name] Primary support relationship to patient:  CHILD, ADULT Degree of support available:   Sons are supportive    CURRENT CONCERNS Current Concerns  Post-Acute Placement   Other Concerns:    SOCIAL WORK ASSESSMENT / PLAN CSW met with patient. Patient was oriented to self, place, and situation. He was not oriented to time. Patient stated that he had been residing in a nursing home in [place] for about a year.  He indicated that he uses his wheelchair for mobility.  Patient reported that he has two sons "Howard [name] and Howard [name]." He stated that his wife use to reside in the nursing home with him. Patient reported that his Howard [name] Howard [name] visits him most often.  Patient indicated that he was willing to return to his nursing home placement upon his discharge.  CSW spoke with Howard [name] of [facility]  via telephone. Howard [name] indicated that patient has been in the facility since July 2014.  Howard [name] confirmed information provided by patient related to his wife.  Howard [name] indicated that patient ambulates independently with transfers, he feeds himself, and is continent.  She stated that staff bathes patient. Howard [name] indicated that patient has family support and that his Howard [name], Howard [name], visits with him. She stated that patient was able to return to the facility upon discharge.  CSW spokes with patient's Howard [name] Howard [name] via telephone.  He confirmed that patient had been in the facility but she died 06-Jan-2013.  Howard [name] confirmed information provided by Howard [name] and patient.  Howard [name] stated that patient's other Howard [name] resides in [place]. Howard [name] stated that she was visiting patient at the facility weekly, however recently he has become sick he has had to modify his visitation to every 2-3 weeks.  He stated that she speaks to patient via phone several times per week.  Howard [name] indicated that he desired for patient to return [facility] upon his discharge as he was unable to provide the care patient needed in the home.   Assessment/plan status:   Other assessment/ plan:   Information/referral to community resources:   [employer]    PATIENT'S/FAMILY'S RESPONSE TO PLAN OF CARE: Family wants patient to return to [facility] upon his discharge.    Howard [name], [place]

## 2013-12-06 LAB — GLUCOSE, CAPILLARY
Glucose-Capillary: 136 mg/dL — ABNORMAL HIGH (ref 70–99)
Glucose-Capillary: 193 mg/dL — ABNORMAL HIGH (ref 70–99)

## 2013-12-06 LAB — VANCOMYCIN, TROUGH: VANCOMYCIN TR: 38.5 ug/mL — AB (ref 10.0–20.0)

## 2013-12-06 MED ORDER — VANCOMYCIN HCL IN DEXTROSE 750-5 MG/150ML-% IV SOLN
750.0000 mg | Freq: Two times a day (BID) | INTRAVENOUS | Status: DC
  Administered 2013-12-06: 750 mg via INTRAVENOUS
  Filled 2013-12-06 (×3): qty 150

## 2013-12-06 MED ORDER — LEVOFLOXACIN 750 MG PO TABS: 750.0000 mg | ORAL_TABLET | Freq: Every day | ORAL | Status: DC

## 2013-12-06 MED ORDER — TRAMADOL HCL 50 MG PO TABS: 50.0000 mg | ORAL_TABLET | Freq: Four times a day (QID) | ORAL | Status: DC | PRN

## 2013-12-06 NOTE — Discharge Summary (Signed)
Physician Discharge Summary  Howard [name] TKZ:[id number] DOB: [birth year] DOA: 12/03/2013  PCP: Cyndee Brightly, MD  Admit date: 12/03/2013 Discharge date: 12/06/2013  Time spent: 45 minutes  Recommendations for Outpatient Follow-up:  -Patient will be discharged back to skilled nurse facility today. -7 more days of Levaquin to complete treatment for hospital-acquired pneumonia.   Discharge Diagnoses:  Principal Problem:   HCAP (healthcare-associated pneumonia) Active Problems:   CAD (coronary artery disease)   Hypertension   Hyperlipidemia   Type II diabetes mellitus   Acute on chronic respiratory failure   Toxic metabolic encephalopathy   Chronic diastolic CHF (congestive heart failure)   Leukocytosis   Discharge Condition: Stable and improved  Filed Weights   12/03/13 1002  Weight: 93.895 kg (207 lb)    History of present illness:  Patient is an 80 year old gentleman with multiple medical comorbidities including coronary artery disease, ischemic cardiomyopathy with diastolic congestive heart failure, diabetes, hypertension, hyperlipidemia, oxygen-dependent COPD who was brought to the hospital today from his nursing home secondary to complaints of shortness of breath. Patient's nurse at nursing facility also stated that he was confused and "talking out of his head". In the emergency department he is found to have a right lung base bronchopneumonia without overt edema. He is requiring 3 L of oxygen when he typically only uses 2 L. Leukocytosis of 15,000. We have been asked to make him for further evaluation and management.   Hospital Course:   Acute hypoxemic respiratory failure  -Secondary to pneumonia.  -Continue to wean oxygen as tolerated.   Hospital-acquired pneumonia  -Transitioned to oral Levaquin to complete 7 extra days. -Received 4 days' worth of vancomycin and cefepime for hospital-acquired coverage in the hospital. -Culture data is negative to  date.  Toxic metabolic encephalopathy  -Resolved as per son at bedside.  -Secondary to pneumonia and hypoxemia.   Leukocytosis  -Secondary to pneumonia.  -Resolved.  Chronic diastolic CHF  -Compensated   Type 2 diabetes mellitus  -Fair control   Hypertension  -Well controlled   Hyperlipidemia  -Continue statin   Procedures:  None   Consultations:  None  Discharge Instructions  Discharge Instructions   Diet - low sodium heart healthy    Complete by:  As directed      Increase activity slowly    Complete by:  As directed             Medication List         albuterol (2.5 MG/3ML) 0.083% nebulizer solution  Commonly known as:  PROVENTIL  Take 2.5 mg by nebulization 4 (four) times daily.     budesonide-formoterol 160-4.5 MCG/ACT inhaler  Commonly known as:  SYMBICORT  Inhale 2 puffs into the lungs 2 (two) times daily.     carvedilol 3.125 MG tablet  Commonly known as:  COREG  Take 3.125 mg by mouth 2 (two) times daily with a meal.     Cinnamon 500 MG Tabs  Take 500 mg by mouth daily.     clopidogrel 75 MG tablet  Commonly known as:  PLAVIX  Take 1 tablet (75 mg total) by mouth daily.     DULoxetine 30 MG capsule  Commonly known as:  CYMBALTA  Take 30 mg by mouth daily.     ferrous sulfate 325 (65 FE) MG EC tablet  Take 325 mg by mouth 2 (two) times daily with a meal.     insulin aspart 100 UNIT/ML injection  Commonly known as:  novoLOG  Inject 5 Units into the skin 3 (three) times daily before meals.     insulin glargine 100 UNIT/ML injection  Commonly known as:  LANTUS  Inject 15 Units into the skin at bedtime.     lansoprazole 30 MG capsule  Commonly known as:  PREVACID  Take 1 capsule (30 mg total) by mouth daily before breakfast.     levofloxacin 750 MG tablet  Commonly known as:  LEVAQUIN  Take 1 tablet (750 mg total) by mouth daily.     loperamide 2 MG tablet  Commonly known as:  IMODIUM A-D  Take 2 mg by mouth daily as needed  for diarrhea or loose stools.     LORazepam 0.5 MG tablet  Commonly known as:  ATIVAN  Take 0.5 mg by mouth every 4 (four) hours as needed.     meclizine 12.5 MG tablet  Commonly known as:  ANTIVERT  Take 12.5 mg by mouth 3 (three) times daily.     polyethylene glycol packet  Commonly known as:  MIRALAX / GLYCOLAX  Take 17 g by mouth daily.     potassium chloride SA 20 MEQ tablet  Commonly known as:  K-DUR,KLOR-CON  Take 20 mEq by mouth daily.     simvastatin 40 MG tablet  Commonly known as:  ZOCOR  Take 40 mg by mouth every evening.     SYSTANE BALANCE 0.6 % Soln  Generic drug:  Propylene Glycol  Apply 1 drop to eye 2 (two) times daily.     temazepam 15 MG capsule  Commonly known as:  RESTORIL  Take 15 mg by mouth at bedtime as needed for sleep.     tiotropium 18 MCG inhalation capsule  Commonly known as:  SPIRIVA  Place 18 mcg into inhaler and inhale daily.     torsemide 20 MG tablet  Commonly known as:  DEMADEX  Take 2 tablets (40 mg total) by mouth daily.     traMADol 50 MG tablet  Commonly known as:  ULTRAM  Take 1 tablet (50 mg total) by mouth every 6 (six) hours as needed.       No Known Allergies    The results of significant diagnostics from this hospitalization (including imaging, microbiology, ancillary and laboratory) are listed below for reference.    Significant Diagnostic Studies: Dg Chest Portable 1 View  12/03/2013   CLINICAL DATA:  Acute onset of shortness of breath approximately 4 hr ago. Lethargy. Acute mental status changes with confusion. Current history of ischemic cardiomyopathy with prior history of CHF.  EXAM: PORTABLE CHEST - 1 VIEW  COMPARISON:  Portable chest x-ray 05/10/2013, 08/17/2012. Two-view chest x-ray 05/09/2012.  FINDINGS: Cardiac silhouette markedly enlarged but stable. Thoracic aorta atherosclerotic and mildly tortuous, unchanged. Hilar and mediastinal contours otherwise unremarkable. Pulmonary venous hypertension without  overt edema. Streaky and patchy airspace opacities at the right lung base. Lungs otherwise clear. No localized airspace consolidation. No visible pleural effusions. No pneumothorax.  IMPRESSION: 1. Bronchopneumonia involving the right lung base. 2. Stable marked cardiomegaly. Pulmonary venous hypertension without overt edema.   Electronically Signed   By: Evangeline Dakin M.D.   On: 12/03/2013 11:13    Microbiology: Recent Results (from the past 240 hour(s))  CULTURE, BLOOD (ROUTINE X 2)     Status: None   Collection Time    12/03/13  4:08 PM      Result Value Ref Range Status   Specimen Description BLOOD RIGHT ARM   Final   Special Requests  BOTTLES DRAWN AEROBIC AND ANAEROBIC 8CC BOTTLES   Final   Culture NO GROWTH 2 DAYS   Final   Report Status PENDING   Incomplete  CULTURE, BLOOD (ROUTINE X 2)     Status: None   Collection Time    12/03/13  4:10 PM      Result Value Ref Range Status   Specimen Description BLOOD RIGHT HAND   Final   Special Requests BOTTLES DRAWN AEROBIC ONLY 6CC BOTTLE   Final   Culture NO GROWTH 2 DAYS   Final   Report Status PENDING   Incomplete     Labs: Basic Metabolic Panel:  Recent Labs Lab 12/03/13 1024 12/04/13 0616 12/05/13 0452  NA 140 137 137  K 5.1 3.6* 3.4*  CL 97 94* 94*  CO2 33* 31 33*  GLUCOSE 200* 187* 139*  BUN 17 20 23   CREATININE 0.98 1.00 1.16  CALCIUM 9.5 8.5 8.8   Liver Function Tests:  Recent Labs Lab 12/03/13 1024  AST 12  ALT 9  ALKPHOS 73  BILITOT 0.8  PROT 7.0  ALBUMIN 3.1*   No results found for this basename: LIPASE, AMYLASE,  in the last 168 hours No results found for this basename: AMMONIA,  in the last 168 hours CBC:  Recent Labs Lab 12/03/13 1024 12/04/13 0616 12/05/13 0452  WBC 15.0* 11.1* 10.2  HGB 10.5* 9.6* 10.0*  HCT 33.0* 29.1* 30.6*  MCV 104.8* 100.3* 100.3*  PLT 236 222 243   Cardiac Enzymes:  Recent Labs Lab 12/03/13 1024  TROPONINI <0.30   BNP: BNP (last 3 results)  Recent  Labs  12/03/13 1024  PROBNP 2384.0*   CBG:  Recent Labs Lab 12/05/13 1003 12/05/13 1158 12/05/13 1639 12/05/13 2104 12/06/13 0722  GLUCAP 255* 247* 104* 155* 136*       Signed:  HERNANDEZ ACOSTA,ESTELA  [facility] Pager: [phone] 12/06/2013, 11:15 AM

## 2013-12-06 NOTE — Care Management Note (Signed)
    Page 1 of 1   12/06/2013     4:59:59 PM CARE MANAGEMENT NOTE 12/06/2013  Patient:  Howard [name], Howard [name]   Account Number:  [id number]  Date Initiated:  12/06/2013  Documentation initiated by:  Vladimir Creeks  Subjective/Objective Assessment:   Admitted from [facility] with PNA. Pt will return there at D/C. CSW aware and is facilitating return     Action/Plan:   Anticipated DC Date:  12/06/2013   Anticipated DC Plan:  SKILLED NURSING FACILITY  In-house referral  Clinical Social Worker      DC Planning Services  CM consult      Choice offered to / List presented to:             Status of service:  Completed, signed off Medicare Important Message given?  YES (If response is "NO", the following Medicare IM given date fields will be blank) Date Medicare IM given:  12/06/2013 Medicare IM given by:  Vladimir Creeks Date Additional Medicare IM given:   Additional Medicare IM given by:    Discharge Disposition:  [place]  Per UR Regulation:  Reviewed for med. necessity/level of care/duration of stay  If discussed at [place] of Stay Meetings, dates discussed:    Comments:  12/06/13 [place] RN/CM

## 2013-12-06 NOTE — Progress Notes (Signed)
ANTIBIOTIC CONSULT NOTE  Pharmacy Consult for Vancomycin & Cefepime Indication: pneumonia  No Known Allergies  Patient Measurements: Height: 5\' 8"  (172.7 cm) Weight: 207 lb (93.895 kg) IBW/kg (Calculated) : 68.4  Vital Signs: Temp: 97.7 F (36.5 C) (10/13 0536) Temp Source: Oral (10/13 0536) BP: 135/69 mmHg (10/13 0536) Pulse Rate: 90 (10/13 0536) Intake/Output from previous day: 10/12 0701 - 10/13 0700 In: 870 [P.O.:720; IV Piggyback:150] Out: 1625 [Urine:1625] Intake/Output from this shift:    Labs:  Recent Labs  12/03/13 1024 12/04/13 0616 12/05/13 0452  WBC 15.0* 11.1* 10.2  HGB 10.5* 9.6* 10.0*  PLT 236 222 243  CREATININE 0.98 1.00 1.16   Estimated Creatinine Clearance: 54.6 ml/min (by C-G formula based on Cr of 1.16).  Recent Labs  12/06/13 0035  VANCOTROUGH 38.5*    Microbiology: Recent Results (from the past 720 hour(s))  CULTURE, BLOOD (ROUTINE X 2)     Status: None   Collection Time    12/03/13  4:08 PM      Result Value Ref Range Status   Specimen Description BLOOD RIGHT ARM   Final   Special Requests BOTTLES DRAWN AEROBIC AND ANAEROBIC 8CC BOTTLES   Final   Culture NO GROWTH 2 DAYS   Final   Report Status PENDING   Incomplete  CULTURE, BLOOD (ROUTINE X 2)     Status: None   Collection Time    12/03/13  4:10 PM      Result Value Ref Range Status   Specimen Description BLOOD RIGHT HAND   Final   Special Requests BOTTLES DRAWN AEROBIC ONLY 6CC BOTTLE   Final   Culture NO GROWTH 2 DAYS   Final   Report Status PENDING   Incomplete    Anti-infectives   Start     Dose/Rate Route Frequency Ordered Stop   12/06/13 1000  vancomycin (VANCOCIN) IVPB 750 mg/150 ml premix     750 mg 150 mL/hr over 60 Minutes Intravenous Every 12 hours 12/06/13 0748     12/03/13 1700  vancomycin (VANCOCIN) IVPB 1000 mg/200 mL premix  Status:  Discontinued     1,000 mg 200 mL/hr over 60 Minutes Intravenous Every 12 hours 12/03/13 1647 12/06/13 0748   12/03/13 1600   ceFEPIme (MAXIPIME) 1 g in dextrose 5 % 50 mL IVPB     1 g 100 mL/hr over 30 Minutes Intravenous 3 times per day 12/03/13 1524 12/11/13 1359   12/03/13 1300  vancomycin (VANCOCIN) IVPB 1000 mg/200 mL premix  Status:  Discontinued     1,000 mg 200 mL/hr over 60 Minutes Intravenous  Once 12/03/13 1225 12/03/13 1524   12/03/13 1130  vancomycin (VANCOCIN) IVPB 1000 mg/200 mL premix  Status:  Discontinued     1,000 mg 200 mL/hr over 60 Minutes Intravenous  Once 12/03/13 1126 12/03/13 1225   12/03/13 1130  piperacillin-tazobactam (ZOSYN) IVPB 3.375 g     3.375 g 100 mL/hr over 30 Minutes Intravenous  Once 12/03/13 1126 12/03/13 1212     Assessment: 62 yoM admitted from nursing facility with SOB and confusion.  CXR + right bronchopneumonia.   He is afebrile.  WBC has improved.  SCr appears stable. Trough level is critically high but was drawn 4 hours too early.    Cefepime 10/10>> Vancomycin 10/10>>  Goal of Therapy:  Vancomycin trough level 15-20 mcg/ml  Plan:  Continue Cefepime 1gm IV q8h Reduce Vancomycin to 750mg  IV q12h Check Vancomycin trough level weekly or sooner if warranted Monitor renal function  and cx data   Howard [name] 12/06/2013,7:48 AM

## 2013-12-06 NOTE — Clinical Social Work Note (Signed)
Pt d/c today back to [facility]. Pt, son Jeneen Rinks, and facility aware and agreeable. Facility to provide transport this afternoon. D/C summary faxed.  Benay Pike, [place]

## 2013-12-07 DIAGNOSIS — I255 Ischemic cardiomyopathy: Secondary | ICD-10-CM

## 2013-12-07 DIAGNOSIS — I509 Heart failure, unspecified: Secondary | ICD-10-CM

## 2013-12-07 DIAGNOSIS — J189 Pneumonia, unspecified organism: Secondary | ICD-10-CM

## 2013-12-08 ENCOUNTER — Non-Acute Institutional Stay (SKILLED_NURSING_FACILITY): Payer: Medicare Other | Admitting: Internal Medicine

## 2013-12-08 DIAGNOSIS — J189 Pneumonia, unspecified organism: Secondary | ICD-10-CM

## 2013-12-08 DIAGNOSIS — Y95 Nosocomial condition: Principal | ICD-10-CM

## 2013-12-08 DIAGNOSIS — I509 Heart failure, unspecified: Secondary | ICD-10-CM

## 2013-12-08 DIAGNOSIS — I255 Ischemic cardiomyopathy: Secondary | ICD-10-CM

## 2013-12-08 LAB — CULTURE, BLOOD (ROUTINE X 2)
Culture: NO GROWTH
Culture: NO GROWTH

## 2013-12-13 NOTE — Progress Notes (Addendum)
Patient ID: Howard [name], male   DOB: [birth year], 80 y.o.   MRN: [id number]               HISTORY & PHYSICAL  DATE:  12/07/2013    FACILITY: [facility]    LEVEL OF CARE:   SNF   CHIEF COMPLAINT:  Readmission to SNF, post stay at [facility], 12/03/2013 through 12/06/2013.  HISTORY OF PRESENT ILLNESS:  This patient developed increasing shortness of breath at the facility, which was acute.  He also was apparently much more confused.  In the emergency room, he was found to have right lung base bronchopneumonia without overt pulmonary edema.  His white count was 15,000.  Chest x-ray showed bronchopneumonia involving the right lung base, stable cardiomegaly.  There was no overt pulmonary edema.  I am not completely certain what he received in the hospital, but he has been discharged back to the facility on seven more days of Levaquin.    PAST MEDICAL HISTORY/PROBLEM LIST:    Coronary artery disease.    COPD.    Chronic diastolic heart failure.    Type 2 diabetes.    Acute-on-chronic  respiratory failure.    History of C.difficile.    Irritable bowel syndrome.    Gastroesophageal reflux disease.    Generalized anxiety.    Insomnia.    Osteoarthritis.    CURRENT MEDICATIONS:  Discharge medications include:      Albuterol nebulizers q.4.    Budesonide/formoterol 160/4.5, 2 puffs two times daily.    Coreg 3.125 two times daily.    Plavix 75 q.d.    Cymbalta 30 q.d.    Ferrous sulfate 325 b.i.d.    NovoLog 5 U three times daily before meals.    Lantus 15 U q.h.s.    Prevacid 30 mg daily before breakfast.    Levaquin for seven more days.    Imodium 2 mg p.r.n. for loose stools.    Ativan 0.5 q.4 p.r.n.    Antivert 12.5 three times daily.    MiraLAX 17 g daily.    K-dur 20 mEq daily.    Zocor 40 q.d.    Restoril 15 mg q.h.s.    Spiriva 18 mcg inhaled daily.    Demadex 40 mg daily.    Ultram 50 q.6 p.r.n.    SOCIAL HISTORY:   HOUSING:  The  patient is an ongoing resident here.   CHILDREN:  He has a son who I believe lives in [place].   ADVANCED DIRECTIVES:  He has no advanced directives on the chart.   FUNCTIONAL STATUS:  He spends most of his day in a wheelchair.  Uses oxygen intermittently.    REVIEW OF SYSTEMS:   GENERAL:  The patient states he does not feel well.   CHEST/RESPIRATORY:  Still coughing up green sputum.   CARDIAC:   No chest pain.   GI:  No abdominal pain, nausea, vomiting, or diarrhea.   GU:  No dysuria.     PHYSICAL EXAMINATION:   Gen: Does not look as well  VITAL SIGNS:   O2 SATURATIONS:  96% on 2 L of oxygen.   PULSE:  97.   RESPIRATIONS:  20.   GENERAL APPEARANCE:  Looks somewhat more despondent than I am used to seeing.   CHEST/RESPIRATORY:  There are indeed crackles at the right base.  The left lung is clear.  There is no wheezing.   CARDIOVASCULAR:  CARDIAC:   Heart sounds are normal.  There are  no murmurs.  No increase in jugular venous pressure.   GASTROINTESTINAL:  ABDOMEN:   Distended.   HERNIA:  There is an incisional hernia, but no tenderness.   LIVER/SPLEEN/KIDNEYS:  No liver, no spleen.   GENITOURINARY:  BLADDER:   Not enlarged.   PSYCHIATRIC:   MENTAL STATUS:   Once again, somewhat more depressed than I am used to seeing.  He complains about being confused himself.    ASSESSMENT/PLAN:  Right lower lobe pneumonia.  Discharged on [place].  He appears to be making improvements, although he is complaining of coughing with sputum.  I think this is resolution of his known problem.    COPD.  This does not appear to be unstable.  He is on oxygen.    Chronic diastolic heart failure with an ejection fraction of 40-45%.  This appears to be stable.  No evidence of heart failure on exam.    Multiple GI issues including nausea and vomiting, diarrhea.  He has had a recent endoscopy that showed gastritis.  He was being worked up for Helicobacter by Dr. Gala Romney, I believe, in [place].     Type 2 diabetes.  Generally poorly controlled.    On ferrous sulfate.  I will need to follow up on this.  Last hemoglobin was stable.    History of depression.  On Cymbalta.  This was not changed.

## 2014-01-13 ENCOUNTER — Ambulatory Visit (INDEPENDENT_AMBULATORY_CARE_PROVIDER_SITE_OTHER): Payer: Medicare Other | Admitting: Internal Medicine

## 2014-01-13 ENCOUNTER — Encounter: Payer: Self-pay | Admitting: Internal Medicine

## 2014-01-13 VITALS — BP 154/68 | HR 84 | Ht 68.0 in | Wt 206.0 lb

## 2014-01-13 DIAGNOSIS — I5032 Chronic diastolic (congestive) heart failure: Secondary | ICD-10-CM

## 2014-01-13 DIAGNOSIS — J410 Simple chronic bronchitis: Secondary | ICD-10-CM

## 2014-01-13 DIAGNOSIS — I255 Ischemic cardiomyopathy: Secondary | ICD-10-CM

## 2014-01-13 NOTE — Assessment & Plan Note (Signed)
He is sedentary and it is difficult to know his limitations. He appears euvolemic. He will continue his current meds. I encouraged him to reduce his sodium intake and to stop eating hot dogs.

## 2014-01-13 NOTE — Patient Instructions (Signed)
Your physician wants you to follow-up in: 1 year with Dr. Taylor. You will receive a reminder letter in the mail two months in advance. If you don't receive a letter, please call our office to schedule the follow-up appointment.  Your physician recommends that you continue on your current medications as directed. Please refer to the Current Medication list given to you today.  Thank you for choosing [facility]!   

## 2014-01-13 NOTE — Assessment & Plan Note (Signed)
He denies anginal symptoms. He is very sedentary. I have asked him to try and lose weight.

## 2014-01-13 NOTE — Progress Notes (Signed)
HPI [name] returns today for followup. He is a very pleasant 80 year old man with a history of coronary artery disease, status post stenting just over a year ago. He has dyslipidemia, and hypertension. He is diabetic. Despite his multiple problems, he is stable from a cardiovascular perspective.  He denies chest pain, shortness of breath, or peripheral edema. He is bothered by arthritis and is now mostly in a wheelchair. No syncope. Recently, he has been bothered by an upper inspiratory tract infection, which is improving. He was in the hospital for 4 days. He denies fevers or chills. No Known Allergies   Current Outpatient Prescriptions  Medication Sig Dispense Refill  . albuterol (PROVENTIL) (2.5 MG/3ML) 0.083% nebulizer solution Take 2.5 mg by nebulization 4 (four) times daily.    . budesonide-formoterol (SYMBICORT) 160-4.5 MCG/ACT inhaler Inhale 2 puffs into the lungs 2 (two) times daily.    . carvedilol (COREG) 3.125 MG tablet Take 3.125 mg by mouth 2 (two) times daily with a meal.     . Cinnamon 500 MG TABS Take 500 mg by mouth daily.    . clopidogrel (PLAVIX) 75 MG tablet Take 1 tablet (75 mg total) by mouth daily. 90 tablet 3  . DULoxetine (CYMBALTA) 30 MG capsule Take 30 mg by mouth daily.    . ferrous sulfate 325 (65 FE) MG EC tablet Take 325 mg by mouth 2 (two) times daily with a meal.    . insulin aspart (NOVOLOG) 100 UNIT/ML injection Inject 5 Units into the skin 3 (three) times daily before meals.    . insulin glargine (LANTUS) 100 UNIT/ML injection Inject 15 Units into the skin at bedtime.    . lansoprazole (PREVACID) 30 MG capsule Take 1 capsule (30 mg total) by mouth daily before breakfast. 30 capsule 11  . levofloxacin (LEVAQUIN) 750 MG tablet Take 1 tablet (750 mg total) by mouth daily. 7 tablet 0  . loperamide (IMODIUM A-D) 2 MG tablet Take 2 mg by mouth daily as needed for diarrhea or loose stools.     Marland Kitchen LORazepam (ATIVAN) 0.5 MG tablet Take 0.5 mg by mouth every 4 (four)  hours as needed.     . meclizine (ANTIVERT) 12.5 MG tablet Take 12.5 mg by mouth 3 (three) times daily.    . polyethylene glycol (MIRALAX / GLYCOLAX) packet Take 17 g by mouth daily.    . potassium chloride SA (K-DUR,KLOR-CON) 20 MEQ tablet Take 20 mEq by mouth daily.    Marland Kitchen Propylene Glycol (SYSTANE BALANCE) 0.6 % SOLN Apply 1 drop to eye 2 (two) times daily.    . simvastatin (ZOCOR) 40 MG tablet Take 40 mg by mouth every evening.    . temazepam (RESTORIL) 15 MG capsule Take 15 mg by mouth at bedtime as needed for sleep.    Marland Kitchen tiotropium (SPIRIVA) 18 MCG inhalation capsule Place 18 mcg into inhaler and inhale daily.    Marland Kitchen torsemide (DEMADEX) 20 MG tablet Take 2 tablets (40 mg total) by mouth daily. 60 tablet 6  . traMADol (ULTRAM) 50 MG tablet Take 1 tablet (50 mg total) by mouth every 6 (six) hours as needed. 30 tablet 0   No current facility-administered medications for this visit.     Past Medical History  Diagnosis Date  . Hypertension   . Hyperlipidemia   . Adenocarcinoma 2003    COLON  . PVD (peripheral vascular disease)     ABI 0.78 RIGHT AND 0.80 LEFT 4/11  . Osteoarthritis     OF  KNEES  . Kidney stone   . Schatzki's ring 08/05/10    egd with Dr. Gala Romney  . Hiatal hernia 08/05/10    egd with Dr. Gala Romney  . Esophageal dysmotility   . Microscopic colitis   . Internal hemorrhoids   . Diverticula of colon   . CHF (congestive heart failure)   . Coronary artery stenosis     a. 06/2011 Cath: LM 20, LAD min irregs, D1 95 - small,  LCX 60p, RCA 11m, 50d, PLA 50ost;  b. 06/2011 PCI/Rota  RCA  -> 3.0x3mm Promus Element DES  . Angina   . COPD (chronic obstructive pulmonary disease)   . Asthma   . Pneumonia ~ 2005; 09/2005  . Shortness of breath 07/17/11    "laying down"  . Type II diabetes mellitus   . GERD (gastroesophageal reflux disease)   . Anxiety   . Ischemic cardiomyopathy     a. 05/2011 Echo: EF 45-50%, inf/post HK  . H. pylori infection 03/01/12    treated with prevpac  .  Tubular adenoma   . Hyperplastic polyp of intestine     ROS:   All systems reviewed and negative except as noted in the HPI.   Past Surgical History  Procedure Laterality Date  . Hemicolectomy  2003  . Appendectomy    . Colonoscopy  04/2001    Dr. Laural Golden- colon carcinoma  . Esophagogastroduodenoscopy  04/2001    Dr. Laural Golden  . Esophagogastroduodenoscopy  08/05/10    Dr. Evalee Mutton ring, hiatal hernia  . Colonoscopy  09/04/09    Dr. Matthew Folks, hemorrhoids  . Atherectomy  07/17/11  . Coronary angioplasty  07/17/11    rotablator  . Colon surgery    . Cataract extraction, bilateral    . Esophagogastroduodenoscopy  03/01/2012    Dr. Gala Romney- gastric erosions, small hiatal hernia, +hpylori,=treated with prevpac  . Colonoscopy  03/11/2012    Dr. Sabino Gasser R hemicolectomy, pancolonic diverticulosis, tubular adenoma, hyperplastic polyp  . Esophagogastroduodenoscopy N/A 08/11/2013    PQZ:RAQTMA Schatzki's ring- s/p dilation as described above. Some retained gastric contents-query delay in gastric emptying. Abnormal gastric mucosa- s/p gastric biopsy  . Savory dilation N/A 08/11/2013    Procedure: SAVORY DILATION;  Surgeon: Daneil Dolin, MD;  Location: [facility];  Service: Endoscopy;  Laterality: N/A;  Venia Minks dilation N/A 08/11/2013    Procedure: Venia Minks DILATION;  Surgeon: Daneil Dolin, MD;  Location: [facility];  Service: Endoscopy;  Laterality: N/A;     Family History  Problem Relation Age of Onset  . Coronary artery disease Father   . Coronary artery disease Brother     CABG  . Diabetes Brother   . Coronary artery disease Brother   . Heart Problems Mother      History   Social History  . Marital Status: Married    Spouse Name: N/A    Number of Children: N/A  . Years of Education: N/A   Occupational History  . Not on file.   Social History Main Topics  . Smoking status: Former Smoker -- 20 years    Types: Cigarettes    Quit date: 02/25/2008  .  Smokeless tobacco: Never Used  . Alcohol Use: No  . Drug Use: No  . Sexual Activity: Not Currently   Other Topics Concern  . Not on file   Social History Narrative   Lives in [place]. Married. Wife in [place].   Son lives with him   Caffeine Use-  BP 154/68 mmHg  Pulse 84  Ht 5\' 8"  (1.727 m)  Wt 206 lb (93.441 kg)  BMI 31.33 kg/m2  SpO2 95%  Physical Exam:  stable appearing elderly man, NAD HEENT: Unremarkable Neck:  7 cm JVD,diskempt appearing, no thyromegally Lungs:  Clear with no wheezes, rales, or rhonchi. HEART:  Regular rate rhythm, no murmurs, no rubs, no clicks, heart sounds are distant. Abd:  soft, soft, obese,positive bowel sounds, no organomegally, no rebound, no guarding Ext:  2 plus pulses, no edema, no cyanosis, no clubbing Skin:  No rashes no nodules Neuro:  CN II through XII intact, motor grossly intact    Assess/Plan:

## 2014-01-13 NOTE — Assessment & Plan Note (Signed)
His symptoms are well controlled. Will follow. 

## 2014-02-02 ENCOUNTER — Encounter (HOSPITAL_COMMUNITY): Payer: Self-pay | Admitting: Cardiovascular Disease

## 2014-02-13 ENCOUNTER — Non-Acute Institutional Stay (SKILLED_NURSING_FACILITY): Payer: Medicare Other | Admitting: Internal Medicine

## 2014-02-13 DIAGNOSIS — I255 Ischemic cardiomyopathy: Secondary | ICD-10-CM

## 2014-02-13 DIAGNOSIS — J449 Chronic obstructive pulmonary disease, unspecified: Secondary | ICD-10-CM

## 2014-02-20 NOTE — Progress Notes (Addendum)
Patient ID: Howard [name], male   DOB: [birth year], 80 y.o.   MRN: [id number]              PROGRESS NOTE  DATE:  02/13/2014      FACILITY: [facility]    LEVEL OF CARE:   SNF   Acute Visit   CHIEF COMPLAINT:  Shortness of breath.    HISTORY OF PRESENT ILLNESS:  I was asked to see this patient tonight because of shortness of breath.     The patient has known COPD as well as an ischemic cardiomyopathy.  He recently saw his cardiologist, Dr. Lovena Le, who felt things were stable.    LABORATORY DATA:  Last lab work I see is from 12/05/2013.    He had a basic metabolic panel with a potassium of 3.4, although that was repeated on 12/13/2013 at 3.9.   BUN was 28, creatinine 1.77.    His hemoglobin was 10.1.    REVIEW OF SYSTEMS:   CHEST/RESPIRATORY:  The patient states he is somewhat short of breath, and short of breath on exertion.   CARDIAC:   He denies chest pain or palpitations.   GI:  No nausea, vomiting or diarrhea.  There is no history of rectal bleeding.    PHYSICAL EXAMINATION:   VITAL SIGNS:   O2 SATURATIONS:  95% on 2 L.   RESPIRATIONS:  20 and unlabored.   PULSE:  76.    GENERAL APPEARANCE:  The patient is not in any distress.   CHEST/RESPIRATORY:  Really good air entry bilaterally.  There is no evidence of wheezing or accessory muscle use.   CARDIOVASCULAR:  CARDIAC:  Heart sounds are distant.  There is a 2/6 midsystolic ejection murmur.   His JVP is not elevated.   EDEMA/VARICOSITIES:  He has no edema.    ASSESSMENT/PLAN:                       Shortness of breath.  This seems mostly an exertional thing.  Nevertheless, his significant issues appear to be quite stable.  He has COPD, on chronic oxygen, yet his air entry is fairly good.    Ischemic cardiomyopathy with an EF of 60-65%.  He also has moderate aortic stenosis and hypokinesis of the basal to mid inferolateral myocardium.  He has grade 1 diastolic dysfunction.  Again, this does not appear to be unstable.   He is on Demadex 40 mg a day, carvedilol 3.125 twice daily.    COPD.  He takes Symbicort 160/4.5, 2 puffs twice daily, Ventolin nebulizers 2.5 four times a day.   He is on oxygen.  His O2 sat today is 95% on 2 L.  He appears to be quite clear at the bedside.    History of multiple GI complaints.  He follows actually with GI.  He has a history of diarrhea, followed with C.diff.  He also has a history of microscopic colitis, hiatal hernia, Schatzki's ring, and adeno-CA of the colon in 2003.  I think his most recent endoscopy was in 2014.  He also had a colonoscopy in 2014 noting a right hemicolectomy, pancolonic diverticulosis, tubular adenoma, and a hyperplastic polyp.    I am going to repeat his lab work as this has not been done in quite some time.  This will include CBC, metabolic panel, as well as a hemoglobin A1c.  Currently, he takes Lantus 15 U at night and 4 U of NovoLog a.c. meals if his  CBG is greater than 100.

## 2014-04-12 ENCOUNTER — Encounter: Payer: Self-pay | Admitting: Gastroenterology

## 2014-04-12 ENCOUNTER — Ambulatory Visit: Payer: Medicaid Other | Admitting: Gastroenterology

## 2014-04-12 ENCOUNTER — Telehealth: Payer: Self-pay | Admitting: Internal Medicine

## 2014-04-12 NOTE — Telephone Encounter (Signed)
PATIENT WAS A NO SHOW 04/12/14 AND LETTER WAS SENT

## 2014-04-22 ENCOUNTER — Non-Acute Institutional Stay (SKILLED_NURSING_FACILITY): Payer: 59 | Admitting: Internal Medicine

## 2014-04-22 DIAGNOSIS — I255 Ischemic cardiomyopathy: Secondary | ICD-10-CM

## 2014-04-22 DIAGNOSIS — I509 Heart failure, unspecified: Secondary | ICD-10-CM | POA: Diagnosis not present

## 2014-04-22 DIAGNOSIS — J449 Chronic obstructive pulmonary disease, unspecified: Secondary | ICD-10-CM | POA: Diagnosis not present

## 2014-04-22 NOTE — Progress Notes (Signed)
Patient ID: Howard [name], male   DOB: Mar 30, 1929, 81 y.o.   MRN: [id number]               HISTORY & PHYSICAL  DATE:  04/22/2014  FACILITY[facility]    LEVEL OF CARE:   SNF   CHIEF COMPLAINT:  Follow-up multiple medical issues including COPD  HISTORY OF PRESENT ILLNESS:  This is a patient with known COPD also an ischemic cardiomyopathy. He is followed also by GI for a multitude of chronic GI complaints. His major issue is COPD on chronic oxygen. Also has a known ischemic cardiomyopathy with ejection fraction of 60-65%, moderate aortic stenosis. With regards to his GI complaints he has sick continuous nausea and a history of diarrhea. He has a history of a Schatzki's ring. He had had no CVA of the colon in 2003 and had a right hemicolectomy. He also has a history of microscopic colitis, pseudomembranous colitis.  PAST MEDICAL HISTORY/PROBLEM LIST:    Coronary artery disease.    COPD.    Chronic diastolic heart failure.    Type 2 diabetes.    Acute-on-chronic  respiratory failure.    History of C.difficile.    Irritable bowel syndrome.    Gastroesophageal reflux disease.    Generalized anxiety.    Insomnia.   .    Osteoarthritis.    Current medications K-Dur 20 milliequivalents daily Plavix 75 daily Spiriva 18 daily Prevacid 30 daily Demadex 40 mg daily MiraLAX 17 g daily Cymbalta 30 mg once daily Ferrous sulfate 25 twice daily Coreg 3.125 twice a day Symbicort 160/4.52 puffs twice daily Antivert 12.53 times daily Zocor 40 daily Albuterol nebulizers 2.54 times a day Restoril 15 mg daily at bedtime NovoLog 5 units before meals Lantus 20 units at night  Labs Last on the chart from 02/20/2014 CBC hemoglobin of 10.2, white count and platelet count normal Comprehensive metabolic panel sodium of 244 potassium of 4.1 BUN of 17, creatinine of 1.03 Hemoglobin A1c of 6.9  SOCIAL HISTORY:   HOUSING:  The patient is an ongoing resident here.   CHILDREN:  He has  a son who I believe lives in [place].   ADVANCED DIRECTIVES:  He has no advanced directives on the chart.   FUNCTIONAL STATUS:  He spends most of his day in a wheelchair.     REVIEW OF SYSTEMS:   GENERAL:  The patient states he does not feel well.   CHEST/RESPIRATORY:  Shortness of breath is at baseline no coughing CARDIAC:   No chest pain.   GI: States he has had nausea but no vomiting. Is due to see Dr. Buford Dresser this week GU:  No dysuria.     PHYSICAL EXAMINATION:   Gen: Does not look as well  VITAL SIGNS:   O2 SATURATIONS:  96% on 2 L of oxygen.   PULSE:  97.   RESPIRATIONS:  20.   GENERAL APPEARANCE:  Looks somewhat more despondent than I am used to seeing.   CHEST/RESPIRATORY:  Shallow air entry bilaterally with expiratory wheezing CARDIOVASCULAR:  CARDIAC:   Heart sounds are normal.  There are no murmurs.  No increase in jugular venous pressure.   GASTROINTESTINAL:  ABDOMEN:   Distended.   HERNIA:  There is an incisional hernia, but no tenderness.   LIVER/SPLEEN/KIDNEYS:  No liver, no spleen.   GENITOURINARY:  BLADDER:   Not enlarged.   PSYCHIATRIC:   MENTAL STATUS:  Remains somewhat depressed-looking  ASSESSMENT/PLAN: COPD; that this is really quite  significant that he still has a bronchospasm and a prolonged expiratory phase yet he is on a fairly maximal regimen. Consider addition of Daliresp Ischemic cardiomyopathy; there is no evidence that this is active at the bedside Multiple GI complaints; he is going to see Dr. Buford Dresser apparently this week.  Follow-up lab work is been ordered

## 2014-04-24 ENCOUNTER — Ambulatory Visit (INDEPENDENT_AMBULATORY_CARE_PROVIDER_SITE_OTHER): Payer: 59 | Admitting: Gastroenterology

## 2014-04-24 ENCOUNTER — Encounter: Payer: Self-pay | Admitting: Gastroenterology

## 2014-04-24 VITALS — BP 144/67 | HR 96 | Temp 98.1°F | Ht 68.0 in

## 2014-04-24 DIAGNOSIS — Z8619 Personal history of other infectious and parasitic diseases: Secondary | ICD-10-CM

## 2014-04-24 DIAGNOSIS — R197 Diarrhea, unspecified: Secondary | ICD-10-CM | POA: Diagnosis not present

## 2014-04-24 DIAGNOSIS — K219 Gastro-esophageal reflux disease without esophagitis: Secondary | ICD-10-CM

## 2014-04-24 NOTE — Assessment & Plan Note (Signed)
Typical GERD had been well-controlled on PPI therapy. Required to stop PPI for 4 weeks prior to checking for H pylori stool antigen. Substitute Tums in the interim. We'll resume Prevacid 30 mg daily after stool collection complete.

## 2014-04-24 NOTE — Patient Instructions (Signed)
1. Stop Prevacid today (04/24/2014). Hold for 4 weeks. In 4 weeks collect stool for H. pylori stool antigen. Then resume Prevacid on 05/22/2014 at previous dose. 2. Collect stool for H. pylori stool antigen as outlined #1. Important that patient be off of proton pump inhibitors and antibiotics for 4 weeks prior to stool collection. 3. May use Tums, 2 by mouth 3 times a day as needed for heartburn while off of Prevacid between the dates of 04/24/2014 and 05/22/2014. 4. Please send copy of the latest labs to fax number [phone]. 5. Please collect stool for C. difficile, culture, Giardia ASAP. 6. Full note to follow.

## 2014-04-24 NOTE — Assessment & Plan Note (Addendum)
Patient is a very difficult historian. Complains of postprandial bowel movements, not always loose but semi-formed. Associated with nausea, generalized abdominal discomfort. History of C. difficile in the past. 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 Initially we will check see differential PCR, stool culture, Giardia/Cryptosporidium. Obtain copies of most recent labs from the nursing home.  It is notable that patient receives MiraLAX daily. If stool studies are negative, first step would be to provide MiraLAX only when needed.

## 2014-04-24 NOTE — Progress Notes (Signed)
Primary Care Physician:  Cyndee Brightly, MD  Primary Gastroenterologist:  Garfield Cornea, MD   Chief Complaint  Patient presents with  . Abdominal Pain    HPI:  Howard [name] is a 81 y.o. male here for further evaluation of abdominal pain, diarrhea. Last seen by her practice back in June 2015 for chronic dysphagia, EGD which showed chronic gastritis, no definitive H pylori. Subtle Schatzki ring status post dilation. Questionable gastroparesis. Dr. Gala Romney had planned for H. pylori stool antigen but this has not been completed. Last colonoscopy in 2014 with tubular adenoma. He has remote history of colon cancer, next surveillance colonoscopy due in 2019. 2014, Ultrasound demonstrated a 7 mm gallbladder polyp. HIDA scan demonstrated gallbladder EF at 28%. H/o c.diff and distant h/o microscopic colitis.   Residing at [facility]. History of COPD, ischemic cardiomyopathy, on chronic oxygen. Hospitalized back in October with healthcare associated pneumonia. Very difficult historian. Reports several postprandial bowel movements daily. Rare nocturnal bowel movements. Stool semi-formed. No blood per rectum. Appetite is good. Denies heartburn. Some nausea but no vomiting. Feels like his abdomen is very tight. Complains of gas and abdominal cramping. No melena. Does not seem to have any issues with dysphagia lately.    Current Outpatient Prescriptions  Medication Sig Dispense Refill  . albuterol (PROVENTIL) (2.5 MG/3ML) 0.083% nebulizer solution Take 2.5 mg by nebulization 4 (four) times daily.    . budesonide-formoterol (SYMBICORT) 160-4.5 MCG/ACT inhaler Inhale 2 puffs into the lungs 2 (two) times daily.    . carvedilol (COREG) 3.125 MG tablet Take 3.125 mg by mouth 2 (two) times daily with a meal.     . Cinnamon 500 MG TABS Take 500 mg by mouth daily.    . clopidogrel (PLAVIX) 75 MG tablet Take 1 tablet (75 mg total) by mouth daily. 90 tablet 3  . DULoxetine (CYMBALTA) 30 MG capsule Take 30 mg by  mouth daily.    . ferrous sulfate 325 (65 FE) MG EC tablet Take 325 mg by mouth 2 (two) times daily with a meal.    . insulin aspart (NOVOLOG) 100 UNIT/ML injection Inject 5 Units into the skin 3 (three) times daily before meals.    . insulin glargine (LANTUS) 100 UNIT/ML injection Inject 15 Units into the skin at bedtime.    . lansoprazole (PREVACID) 30 MG capsule Take 1 capsule (30 mg total) by mouth daily before breakfast. 30 capsule 11  . loperamide (IMODIUM A-D) 2 MG tablet Take 2 mg by mouth daily as needed for diarrhea or loose stools.     Marland Kitchen LORazepam (ATIVAN) 0.5 MG tablet Take 0.5 mg by mouth every 4 (four) hours as needed.     . meclizine (ANTIVERT) 12.5 MG tablet Take 12.5 mg by mouth 3 (three) times daily.    . polyethylene glycol (MIRALAX / GLYCOLAX) packet Take 17 g by mouth daily.    . potassium chloride SA (K-DUR,KLOR-CON) 20 MEQ tablet Take 20 mEq by mouth daily.    Marland Kitchen Propylene Glycol (SYSTANE BALANCE) 0.6 % SOLN Apply 1 drop to eye 2 (two) times daily.    . simvastatin (ZOCOR) 40 MG tablet Take 40 mg by mouth every evening.    . temazepam (RESTORIL) 15 MG capsule Take 15 mg by mouth at bedtime as needed for sleep.    Marland Kitchen tiotropium (SPIRIVA) 18 MCG inhalation capsule Place 18 mcg into inhaler and inhale daily.    Marland Kitchen torsemide (DEMADEX) 20 MG tablet Take 2 tablets (40 mg total) by mouth  daily. 60 tablet 6  . traMADol (ULTRAM) 50 MG tablet Take 1 tablet (50 mg total) by mouth every 6 (six) hours as needed. 30 tablet 0   No current facility-administered medications for this visit.    Allergies as of 04/24/2014  . (No Known Allergies)    Past Medical History  Diagnosis Date  . Hypertension   . Hyperlipidemia   . Adenocarcinoma 2003    COLON  . PVD (peripheral vascular disease)     ABI 0.78 RIGHT AND 0.80 LEFT 4/11  . Osteoarthritis     OF KNEES  . Kidney stone   . Schatzki's ring 08/05/10    egd with Dr. Gala Romney  . Hiatal hernia 08/05/10    egd with Dr. Gala Romney  .  Esophageal dysmotility   . Microscopic colitis   . Internal hemorrhoids   . Diverticula of colon   . CHF (congestive heart failure)   . Coronary artery stenosis     a. 06/2011 Cath: LM 20, LAD min irregs, D1 95 - small,  LCX 60p, RCA 66m, 50d, PLA 50ost;  b. 06/2011 PCI/Rota  RCA  -> 3.0x60mm Promus Element DES  . Angina   . COPD (chronic obstructive pulmonary disease)   . Asthma   . Pneumonia ~ 2005; 09/2005  . Shortness of breath 07/17/11    "laying down"  . Type II diabetes mellitus   . GERD (gastroesophageal reflux disease)   . Anxiety   . Ischemic cardiomyopathy     a. 05/2011 Echo: EF 45-50%, inf/post HK  . H. pylori infection 03/01/12    treated with prevpac  . Tubular adenoma   . Hyperplastic polyp of intestine     Past Surgical History  Procedure Laterality Date  . Hemicolectomy  2003  . Appendectomy    . Colonoscopy  04/2001    Dr. Laural Golden- colon carcinoma  . Esophagogastroduodenoscopy  04/2001    Dr. Laural Golden  . Esophagogastroduodenoscopy  08/05/10    Dr. Evalee Mutton ring, hiatal hernia  . Colonoscopy  09/04/09    Dr. Matthew Folks, hemorrhoids  . Atherectomy  07/17/11  . Coronary angioplasty  07/17/11    rotablator  . Colon surgery    . Cataract extraction, bilateral    . Esophagogastroduodenoscopy  03/01/2012    Dr. Gala Romney- gastric erosions, small hiatal hernia, +hpylori,=treated with prevpac  . Colonoscopy  03/11/2012    Dr. Sabino Gasser R hemicolectomy, pancolonic diverticulosis, tubular adenoma, hyperplastic polyp  . Esophagogastroduodenoscopy N/A 08/11/2013    NAT:FTDDUK Schatzki's ring- s/p dilation as described above. Some retained gastric contents-query delay in gastric emptying. Abnormal gastric mucosa- s/p gastric biopsy  . Savory dilation N/A 08/11/2013    Procedure: SAVORY DILATION;  Surgeon: Daneil Dolin, MD;  Location: [facility];  Service: Endoscopy;  Laterality: N/A;  Venia Minks dilation N/A 08/11/2013    Procedure: Venia Minks DILATION;  Surgeon: Daneil Dolin, MD;  Location: [facility];  Service: Endoscopy;  Laterality: N/A;  . Percutaneous coronary stent intervention (pci-s) N/A 07/17/2011    Procedure: PERCUTANEOUS CORONARY STENT INTERVENTION (PCI-S);  Surgeon: Burnell Blanks, MD;  Location: [facility] CATH LAB;  Service: Cardiovascular;  Laterality: N/A;    Family History  Problem Relation Age of Onset  . Coronary artery disease Father   . Coronary artery disease Brother     CABG  . Diabetes Brother   . Coronary artery disease Brother   . Heart Problems Mother     History   Social History  .  Marital Status: Married    Spouse Name: N/A  . Number of Children: N/A  . Years of Education: N/A   Occupational History  . Not on file.   Social History Main Topics  . Smoking status: Former Smoker -- 20 years    Types: Cigarettes    Quit date: 02/25/2008  . Smokeless tobacco: Never Used  . Alcohol Use: No  . Drug Use: No  . Sexual Activity: Not Currently   Other Topics Concern  . Not on file   Social History Narrative   Lives in [place]. Married. Wife in [place].   Son lives with him   Caffeine Use-      ROS:  General: Negative for anorexia, weight loss, fever, chills, fatigue, weakness. Eyes: Negative for vision changes.  ENT: Negative for hoarseness, difficulty swallowing , nasal congestion. CV: Negative for chest pain, angina, palpitations, dyspnea on exertion, peripheral edema.  Respiratory: Negative for dyspnea at rest, dyspnea on exertion, cough, sputum, wheezing.  GI: See history of present illness. GU:  Negative for dysuria, hematuria, urinary incontinence, urinary frequency, nocturnal urination.  MS: Negative for joint pain, low back pain.  Derm: Negative for rash or itching.  Neuro: Negative for weakness, abnormal sensation, seizure, frequent headaches, memory loss, confusion.  Psych: Negative for anxiety, depression, suicidal ideation, hallucinations.  Endo: Negative for unusual weight change.  Heme:  Negative for bruising or bleeding. Allergy: Negative for rash or hives.    Physical Examination:  BP 144/67 mmHg  Pulse 96  Temp(Src) 98.1 F (36.7 C)  Ht 5\' 8"  (1.727 m)   General: Well-nourished, well-developed in no acute distress.  Head: Normocephalic, atraumatic.   Eyes: Conjunctiva pink, no icterus. Mouth: Oropharyngeal mucosa moist and pink , no lesions erythema or exudate. Neck: Supple without thyromegaly, masses, or lymphadenopathy.  Lungs: Clear to auscultation bilaterally.  Heart: Regular rate and rhythm, no murmurs rubs or gallops.  Abdomen: Bowel sounds are normal, nontender, nondistended, no hepatosplenomegaly or masses, no abdominal bruits or    hernia , no rebound or guarding.   Rectal: not performed Extremities: Trace lower extremity edema. No clubbing or deformities.  Neuro: Alert and oriented x 4 , grossly normal neurologically.  Skin: Warm and dry, no rash or jaundice.   Psych: Alert and cooperative, normal mood and affect.  Labs: Lab Results  Component Value Date   WBC 10.2 12/05/2013   HGB 10.0* 12/05/2013   HCT 30.6* 12/05/2013   MCV 100.3* 12/05/2013   PLT 243 12/05/2013   Lab Results  Component Value Date   CREATININE 1.16 12/05/2013   BUN 23 12/05/2013   NA 137 12/05/2013   K 3.4* 12/05/2013   CL 94* 12/05/2013   CO2 33* 12/05/2013   Lab Results  Component Value Date   ALT 9 12/03/2013   AST 12 12/03/2013   ALKPHOS 73 12/03/2013   BILITOT 0.8 12/03/2013     Imaging Studies: No results found.

## 2014-04-24 NOTE — Assessment & Plan Note (Signed)
Proceed with H.pylori stool antigen as originally planned base on pathology from EGD last fall.  Hold prevacid for four weeks then submit stool for testing.  Resume Prevacid after stool collection. May use TUMS while awaiting stool collection.  Written instructions provided to the nursing home.

## 2014-04-25 NOTE — Progress Notes (Signed)
Cc'ed to pcp °

## 2014-06-21 ENCOUNTER — Non-Acute Institutional Stay (SKILLED_NURSING_FACILITY): Payer: Medicare Other | Admitting: Internal Medicine

## 2014-06-21 DIAGNOSIS — J449 Chronic obstructive pulmonary disease, unspecified: Secondary | ICD-10-CM

## 2014-06-21 DIAGNOSIS — K219 Gastro-esophageal reflux disease without esophagitis: Secondary | ICD-10-CM | POA: Diagnosis not present

## 2014-06-21 DIAGNOSIS — I509 Heart failure, unspecified: Secondary | ICD-10-CM

## 2014-06-25 NOTE — Progress Notes (Addendum)
Patient ID: Howard [name], male   DOB: [birth year], 81 y.o.   MRN: [id number]                PROGRESS NOTE  DATE:  06/21/2014        FACILITY: [facility]                  LEVEL OF CARE:   SNF   Routine Visit             CHIEF COMPLAINT:  Routine visit/[facility] visit.     HISTORY OF PRESENT ILLNESS:  The patient transferred to [facility] last month.   He is a patient with multiple medical issues.    He was apparently hypoglycemic last month.    I see that he was also seen at the end of February by Dr. Roseanne Kaufman office.       PAST MEDICAL HISTORY/PROBLEM LIST:               Coronary artery disease with a history of chronic diastolic heart failure.    COPD.  On chronic oxygen.    Type 2 diabetes with diabetic chronic renal failure.  As noted, he had a single episode of hypoglycemia last month.    Multiple GI complaints including a history of C.difficile, microscopic colitis, irritable bowel syndrome, gastroesophageal reflux disease, Schatzki's ring.    Generalized anxiety with some depression.    Insomnia.    Osteoarthritis.    CURRENT MEDICATIONS:  Medication list is reviewed.            K-Dur 20 mEq daily.      Plavix 75 q.d.      Spiriva 1 capsule daily.     Prevacid 30 mg daily.      Demadex 20 mg, 2 tablets/40 mg daily.     MiraLAX 17 g daily.     Cymbalta 30 mg daily.    Restoril 15 mg q.h.s. p.r.n.       Claritin 10 mg daily.    Ferrous sulfate 325 b.i.d.      Coreg 3.125 b.i.d.       Systane ophthalmic.    Symbicort 160/4.5 twice daily.    Antivert 12.5 three times daily.     Zocor 40 q.d.      Ventolin nebulizer routinely four times a day.     Humalog insulin 8 U subcutaneously t.i.d. before meals if blood sugars are greater than 100.    Lantus 20 U at bedtime.    O2 chronically.    LABORATORY DATA:   Recent lab work includes:    Helicobacter pylori stool antigen was negative.     On 05/24/2014:  Hemoglobin A1c 7.6.    On 04/25/2014:   White count 9.4, hemoglobin 9.2.    On 12/21/2534:  Basic metabolic panel normal.  Hemoglobin 10.2.     REVIEW OF SYSTEMS:    CHEST/RESPIRATORY:  The patient states his shortness of breath is at baseline, although he is episodically more short of breath though I cannot really get a sense of this.   CARDIAC:  No clear chest pain.   GI:  States his diarrhea is better.  He episodically has nausea, but no vomiting.   GU:  No dysuria.      PHYSICAL EXAMINATION:   VITAL SIGNS:   O2 SATURATIONS:  94% on 2 L.   RESPIRATIONS:    18 and unlabored.   PULSE:     92.  CHEST/RESPIRATORY:  Shallow air entry, but with no wheezing.  No accessory muscle use.      CARDIOVASCULAR:   CARDIAC:  Heart sounds are normal.  He is not in heart failure.  No elevation of jugular venous pressure.      GASTROINTESTINAL:   ABDOMEN:  Very distended.  No masses.     LIVER/SPLEEN/KIDNEYS:  No liver, no spleen.  No tenderness.   PSYCHIATRIC:   MENTAL STATUS:  Somewhat depressed-looking, but I think this is largely controlled.     ASSESSMENT/PLAN:                   COPD.  This appears to be stable.    History of an ischemic cardiomyopathy with a normal ejection fraction and diastolic heart failure.  This is stable at the bedside.    Multiple GI issues.  Fortunately, he follows with GI.  He recently had a stool antigen for H.pylori that was negative.  He had an EGD in June 2015 which showed chronic gastritis, subtle Schatzki's ring.  His last colonoscopy was in 2014 with a tubular adenoma.  He has a remote history of colon cancer with the next surveillance colonoscopy in 2019.  He also has a history of C.diff and a distant history of microscopic colitis.  I see that a lot of stools were ordered here, although I do not see any of these results other than the H.pylori stool antigen.     Type 2 diabetes with chronic renal insufficiency.  His hemoglobin A1c is actually fairly stable.  He did have a hypoglycemic spell last  month.  This will need to be monitored.   Recent increase in his Humalog.

## 2014-07-24 ENCOUNTER — Non-Acute Institutional Stay (SKILLED_NURSING_FACILITY): Payer: Medicare Other | Admitting: Internal Medicine

## 2014-07-24 DIAGNOSIS — K589 Irritable bowel syndrome without diarrhea: Secondary | ICD-10-CM

## 2014-07-24 DIAGNOSIS — J449 Chronic obstructive pulmonary disease, unspecified: Secondary | ICD-10-CM

## 2014-07-24 DIAGNOSIS — I255 Ischemic cardiomyopathy: Secondary | ICD-10-CM | POA: Diagnosis not present

## 2014-07-24 NOTE — Progress Notes (Signed)
Patient ID: Howard [name], male   DOB: May 10, 1929, 81 y.o.   MRN: [id number]                PROGRESS NOTE  DATE: 07/24/2014     FACILITY: [facility]                  LEVEL OF CARE:   SNF   Routine Visit             CHIEF COMPLAINT:  Routine visit/[facility] visit.     HISTORY OF PRESENT ILLNESS: .   He is a patient with multiple medical issues.  He has multiple medical complaints which are sometimes hard to follow. This month mostly he seems to be complaining of urinary frequency together with some urinary incontinence and also some fecal incontinence. It is very difficult to get a sense of this from him. He does have some a history of chronic diarrhea likely secondary to a combination of irritable bowel syndrome and microscopic colitis. I think at the request of Dr. Guerry Minors office we did fecal studies on him in March including ova and parasites, Giardia all of which was negative. CNS did not show salmonella or Shigella or Campylobacter Escherichia coli Shiga toxin was negative        PAST MEDICAL HISTORY/PROBLEM LIST:               Coronary artery disease with a history of chronic diastolic heart failure.    COPD.  On chronic oxygen.    Type 2 diabetes with diabetic chronic renal failure.  As noted, he had a single episode of hypoglycemia last month.    Multiple GI complaints including a history of C.difficile, microscopic colitis, irritable bowel syndrome, gastroesophageal reflux disease, Schatzki's ring.    Generalized anxiety with some depression.    Insomnia.    Osteoarthritis.    CURRENT MEDICATIONS:  Medication list is reviewed.            K-Dur 20 mEq daily.      Plavix 75 q.d.      Spiriva 1 capsule daily.     Prevacid 30 mg daily.      Demadex 20 mg, 2 tablets/40 mg daily.     MiraLAX 17 g daily.     Cymbalta 30 mg daily.    Restoril 15 mg q.h.s. p.r.n.       Claritin 10 mg daily.    Ferrous sulfate 325 b.i.d.      Coreg 3.125 b.i.d.       Systane  ophthalmic.    Symbicort 160/4.5 twice daily.    Antivert 12.5 three times daily.     Zocor 40 q.d.      Ventolin nebulizer routinely four times a day.     Humalog insulin 8 U subcutaneously t.i.d. before meals if blood sugars are greater than 100.    Lantus 20 U at bedtime.    O2 chronically.    LABORATORY DATA:   Recent lab work includes:    Helicobacter pylori stool antigen was negative.     On 05/24/2014:  Hemoglobin A1c 7.6.    On 04/25/2014:  White count 9.4, hemoglobin 9.2.    On 25/06/3974:  Basic metabolic panel normal.  Hemoglobin 10.2.     REVIEW OF SYSTEMS:    CHEST/RESPIRATORY:  The patient states his shortness of breath is at baseline, although he is episodically more short of breath though I cannot really get a sense of this.  CARDIAC:  No clear chest pain.   GI:  Multiple GI complaints however no abdominal pain. He is not complaining of dysphagia he does complain of multiple diarrheal stools and fecal incontinence.  PHYSICAL EXAMINATION:   VITAL SIGNS:   O2 SATURATIONS:  94% on 2 L.   RESPIRATIONS:    18 and unlabored.   PULSE:     92.     CHEST/RESPIRATORY:  Shallow air entry, but with no wheezing.  No accessory muscle use.      CARDIOVASCULAR:   CARDIAC:  Heart sounds are normal.  He is not in heart failure.  No elevation of jugular venous pressure.  He does have significant bilateral lower extremity edema roughly his mid thigh    GASTROINTESTINAL:   ABDOMEN:  Very distended.  No masses.    This is not unusual for him LIVER/SPLEEN/KIDNEYS:  No liver, no spleen.  No tenderness.   PSYCHIATRIC:   MENTAL STATUS:  Somewhat depressed-looking, but I think this is largely controlled.     ASSESSMENT/PLAN:                   COPD.  This appears to be stable.    History of an ischemic cardiomyopathy with a normal ejection fraction and diastolic heart failure.  This is stable at the bedside.  However I wonder about his edema and his weights.  Multiple GI  issues.  Fortunately, he follows with GI.  He recently had a stool antigen for H.pylori that was negative.  He had an EGD in June 2015 which showed chronic gastritis, subtle Schatzki's ring.  His last colonoscopy was in 2014 with a tubular adenoma.  He has a remote history of colon cancer with the next surveillance colonoscopy in 2019.  He also has a history of C.diff and a distant history of microscopic colitis.  I see that a lot of stools were ordered here, although I do not see any of these results other than the H.pylori stool antigen.     Type 2 diabetes with chronic renal insufficiency.  His hemoglobin A1c is actually fairly stable

## 2014-08-12 ENCOUNTER — Non-Acute Institutional Stay (SKILLED_NURSING_FACILITY): Payer: Medicare Other | Admitting: Internal Medicine

## 2014-08-12 DIAGNOSIS — R410 Disorientation, unspecified: Secondary | ICD-10-CM | POA: Diagnosis not present

## 2014-08-14 ENCOUNTER — Other Ambulatory Visit: Payer: Self-pay | Admitting: Internal Medicine

## 2014-08-14 DIAGNOSIS — R4182 Altered mental status, unspecified: Secondary | ICD-10-CM

## 2014-08-15 ENCOUNTER — Other Ambulatory Visit: Payer: Self-pay | Admitting: Internal Medicine

## 2014-08-17 ENCOUNTER — Ambulatory Visit (HOSPITAL_COMMUNITY)
Admission: RE | Admit: 2014-08-17 | Discharge: 2014-08-17 | Disposition: A | Discharge status: Home / Self Care | Payer: Medicare Other | Source: Ambulatory Visit | Attending: Internal Medicine | Admitting: Internal Medicine

## 2014-08-17 DIAGNOSIS — R4182 Altered mental status, unspecified: Secondary | ICD-10-CM | POA: Insufficient documentation

## 2014-08-17 LAB — BLOOD GAS, ARTERIAL
Acid-Base Excess: 8.2 mmol/L — ABNORMAL HIGH (ref 0.0–2.0)
Bicarbonate: 32.6 mEq/L — ABNORMAL HIGH (ref 20.0–24.0)
DRAWN BY: 234301
FIO2: 0.21 %
O2 Saturation: 95.3 %
PH ART: 7.433 (ref 7.350–7.450)
PO2 ART: 75.3 mmHg — AB (ref 80.0–100.0)
TCO2: 30.1 mmol/L (ref 0–100)
pCO2 arterial: 49.6 mmHg — ABNORMAL HIGH (ref 35.0–45.0)

## 2014-08-17 NOTE — Progress Notes (Addendum)
Patient ID: Howard [name], male   DOB: 08-31-1929, 81 y.o.   MRN: [id number]                PROGRESS NOTE  DATE:  08/12/2014                FACILITY: [facility]                                 LEVEL OF CARE:   SNF   Acute Visit                         CHIEF COMPLAINT:   Altered mental status.      HISTORY OF PRESENT ILLNESS:  I have been called several times this month with regards to [name], who apparently has not been "acting quite right" since almost the beginning of the month.  He has apparently had confusion.  There has also been some mild fever.    He had some problems with his roommate and his room has been moved.  He apparently was not sleeping at night.    LABORATORY DATA:  Urinalysis on June 1st was negative.  Urine culture showed no growth.    Thyroid function tests were checked.  His free T4, T3 level, and ammonia level were all normal.    CBC:  White count 10, hemoglobin 10.1, platelet count 256.  Differential count showed mild neutrophilia.    Comprehensive metabolic panel:  BUN 23, creatinine 1.42.  BUN and creatinine were both normal.    Liver function tests were normal.    Ammonia level 75.    CBGs actually look to be fairly well controlled from the low to mid 100s at all times of the day.    CURRENT MEDICATIONS:  Medication list is reviewed.                   K-Dur 20 mEq a day.    Plavix 75 q.d.      Spiriva 1 capsule daily.    Prevacid 30 q.d.      Demadex 40 q.d.      MiraLAX 17 g daily.    Cymbalta 30 q.d.                 Ferrous sulfate 325 b.i.d.      Carvedilol 3.125 b.i.d.         Symbicort 2 puffs twice daily.    Ultram 50 b.i.d.      Antivert 12.5 three times daily.    Zocor 40 q.d.       Systane ophthalmic   Proventil nebulizers four times a day.     Restoril 7.5 q.h.s.  (This is a recent addition.)    Lantus 25 q.h.s., which is a recent increase.    Synthroid 25 q.d., just started on 08/10/2014.          PHYSICAL EXAMINATION:   VITAL SIGNS:     RESPIRATIONS:  18 and unlabored.   02 SATURATIONS:  95% on 2 L.   GENERAL APPEARANCE:  The patient is not in any distress.     CHEST/RESPIRATORY:   Shallow, but otherwise clear air entry, compatible with his COPD.     CARDIOVASCULAR:   CARDIAC:  Heart sounds are normal.  He appears to be euvolemic.   GASTROINTESTINAL:   ABDOMEN:  Distended, as usual.  No masses.  Bowel sounds are positive.   LIVER/SPLEEN/KIDNEYS:  No liver, no spleen.  No tenderness.  No shifting dullness.   GENITOURINARY:   BLADDER:  No suprapubic or costovertebral angle tenderness.   NEUROLOGICAL:   As far as I can tell, this is non-lateralizing.   SENSATION/STRENGTH:  His upper and lower extremity strength seems at baseline.   DEEP TENDON REFLEXES:  Reflexes are symmetric.  Both toes are downgoing.   BALANCE/GAIT:  I did not attempt to ambulate him.   PSYCHIATRIC:   MENTAL STATUS:  A lot of difficulties here.  He said the year was 72, corrected to 06/11/18.  Says his wife died three years ago in this facility, cannot come up with the year.  As usual, he seems somewhat inattentive.  Does not respond well to direct questions.     ASSESSMENT/PLAN:                    ?Acute delirium.  Work-up to date, including urine and lab work, all has been negative.  There have been some adjustments to his medications.  There are some culprits among his medications, although none of them are new and I am not sure I find this an easy scapegoat at this point.  I had wondered about depression earlier in the month.  I do not see this currently.  The [facility] practitioner had asked about neuroimaging and I think this is probably worthwhile at this point since this has been going on for several weeks now and the cause of this is not clear.  Likely needs a CT head   CPT CODE: 78588

## 2014-08-19 ENCOUNTER — Non-Acute Institutional Stay (SKILLED_NURSING_FACILITY): Payer: Medicare Other | Admitting: Internal Medicine

## 2014-08-19 DIAGNOSIS — R410 Disorientation, unspecified: Secondary | ICD-10-CM

## 2014-08-19 NOTE — Progress Notes (Signed)
Patient ID: Howard [name], male   DOB: Apr 01, 1929, 81 y.o.   MRN: [id number] Facility; [facility] Chief complaint; follow-up mental status alterations History I saw Howard [name] on 6/18 along with visits by the [facility] nurse practitioner for altered mental status noted by all the staff in the facility. He has been for a CT scan and the arterial blood gas at the [facility] the results which are quoted below. The staff earlier today think he is better.  Review of systems Gen. patient states he feels "rough" Respiratory no cough no sputum Cardiac no clear chest pain GI no clear abdominal pain or diarrhea GU no clear dysuria.  Physical examination Gen. the patient is awake conversational seems somewhat more focused Vitals O2 sat is 95% on 2 L respirations 18 and unlabored pulse rate 72 Respiratory shallow but otherwise clear entry bilaterally Cardiac heart sounds are normal he does not appear to be dehydrated Abdomen distended however bowel sounds are positive there is no tenderness. GU no suprapubic or costovertebral angle tenderness. Neurologic; there is no lateralizing signs that I can elicit.  Mental status: Although he is more focused he does not remember going to the hospital this week then he said x-rays but could not tell me which one.  Impression/plan #1 delirium of uncertain etiology. He does seem more focused the day although not remembering the trip to the hospital would be unlikely him. There've been no new medication additions and nothing really seems a likely culprit of her once he is taking. He does have a history of depression and I have wondered somewhat how much this could be contributing. For now I have no good suggestions on how to move forward here other than to follow this carefully over the short-term.          PACS Images       Show images for CT Head Wo Contrast       Study Result       CLINICAL DATA:  Altered mental status   EXAM: CT HEAD WITHOUT  CONTRAST   TECHNIQUE: Contiguous axial images were obtained from the base of the skull through the vertex without intravenous contrast.   COMPARISON:  None.   FINDINGS: No skull fracture is noted. Paranasal sinuses and mastoid air cells are unremarkable. Atherosclerotic calcifications of the carotid siphon. No intracranial hemorrhage, mass effect or midline shift. Moderate cerebral atrophy. Significant periventricular and patchy subcortical white matter decreased attenuation probable due to chronic small vessel ischemic changes. No acute cortical infarction. No mass lesion is noted on this unenhanced scan.   IMPRESSION: No acute intracranial abnormality. Moderate cerebral atrophy. Significant periventricular and patchy subcortical white matter decreased attenuation probable to chronic small vessel ischemic changes. No definite acute cortical infarction.     Electronically Signed   By: Lahoma Crocker M.D.   On: 08/17/2014 15:31    Results for Howard [name], Howard [name] (MRN [id number]) as of 08/19/2014 15:09  Ref. Range 08/17/2014 14:07 08/17/2014 14:50  Sample type Unknown  ARTERIAL DRAW  Delivery systems Unknown  ROOM AIR  FIO2 Latest Units: %  0.21  pH, Arterial Latest Ref Range: 7.350-7.450   7.433  pCO2 arterial Latest Ref Range: 35.0-45.0 mmHg  49.6 (H)  pO2, Arterial Latest Ref Range: 80.0-100.0 mmHg  75.3 (L)  Bicarbonate Latest Ref Range: 20.0-24.0 mEq/L  32.6 (H)  TCO2 Latest Ref Range: 0-100 mmol/L  30.1  Acid-Base Excess Latest Ref Range: 0.0-2.0 mmol/L  8.2 (H)  O2 Saturation Latest  Units: %  95.3  Collection site Unknown  RIGHT BRACHIAL  Allens test (pass/fail) Latest Ref Range: PASS   PASS  CT HEAD WO CONTRAST Unknown Rpt

## 2014-09-17 ENCOUNTER — Non-Acute Institutional Stay (SKILLED_NURSING_FACILITY): Payer: Medicare Other | Admitting: Internal Medicine

## 2014-09-17 DIAGNOSIS — J449 Chronic obstructive pulmonary disease, unspecified: Secondary | ICD-10-CM | POA: Diagnosis not present

## 2014-09-17 DIAGNOSIS — R410 Disorientation, unspecified: Secondary | ICD-10-CM | POA: Diagnosis not present

## 2014-09-17 DIAGNOSIS — I255 Ischemic cardiomyopathy: Secondary | ICD-10-CM | POA: Diagnosis not present

## 2014-09-17 DIAGNOSIS — E1121 Type 2 diabetes mellitus with diabetic nephropathy: Secondary | ICD-10-CM

## 2014-09-20 NOTE — Progress Notes (Addendum)
Patient ID: Howard [name], male   DOB: 08-01-1929, 81 y.o.   MRN: [id number]                PROGRESS NOTE  DATE:  09/17/2014         FACILITY: [facility]                           LEVEL OF CARE:   SNF   Routine Visit                 CHIEF COMPLAINT:  Routine visit/[employer] visit.      HISTORY OF PRESENT ILLNESS:  Howard [name] is a gentleman with multiple medical issues.  He sees a lot of outside consultants.    Last month, we saw him with regards to altered mental status.  He had a CT scan of the head that was negative.  A work-up was negative.    He appears to have gone out to Orthopedics and had, I think, a steroid injection in his left knee.    PAST MEDICAL HISTORY/PROBLEM LIST:         Coronary artery disease with a history of diastolic heart failure.    COPD.  On chronic oxygen.    Type 2 diabetes with diabetic chronic renal failure.      Multiple GI complaints including microscopic colitis, irritable bowel, chronic diarrhea, a history of C.diff, gastroesophageal reflux, and a Schatzki's ring.    Generalized anxiety with some depression.    Insomnia.    Osteoarthritis.    Acute delirium last month.  The cause of this was never really identified.    CURRENT MEDICATIONS:  Medication list is reviewed.              K-Dur 20 mEq daily.      Plavix 75 q.d.      Spiriva 2 inhalations daily.    Prevacid 30 q.d.      Demadex 40 daily.    MiraLAX 17 g daily.     Cymbalta 30 q.d.     Ativan 0.5 q.a.m.      Ferrous sulfate 325 b.i.d.      Coreg 3.25 b.i.d.      Symbicort 160/4.5 b.i.d.      Ultram 50 b.i.d.    Ascorbic acid 093 b.i.d. (not certain of the reason for this).     Meclizine 12.5 three times a day.    Zocor 40 q.d.     Systane ophthalmic three times a day.    Synthroid 25 q.d.      Ventolin nebulizers four times daily.    Restoril 7.5 q.h.s.      Humalog insulin 8 U three times daily before meals.    Lantus insulin 25 U at h.s.       O2, 2 L chronically.    REVIEW OF SYSTEMS:    Heent: no headaches  CHEST/RESPIRATORY:  The patient states his shortness of breath is unchanged.    CARDIAC:  No clear chest pain.   GI:  Multiple GI complaints, although he has no clear abdominal pain, dysphagia.  He also has some degree of fecal incontinence.     GU:  No clear dysuria.    PHYSICAL EXAMINATION:   VITAL SIGNS:     PULSE:  86 and regular.    RESPIRATIONS:  18 and unlabored.    02 SATURATIONS:  99% on 2 L.  GENERAL APPEARANCE:  The patient is not in any distress.   As usual, he talks and what he is talking about sometimes is hard to follow (circumferential thought pattern).    CHEST/RESPIRATORY:  Fairly clear air entry bilaterally, but no wheezing.  No accessory muscle use.  Work of breathing is normal.    CARDIOVASCULAR:   CARDIAC:  Heart sounds are normal.  No elevation of his jugular venous pressure.      GASTROINTESTINAL:   ABDOMEN:  Distended.  No masses.    LIVER/SPLEEN/KIDNEYS:  No liver, no spleen.   PSYCHIATRIC:   MENTAL STATUS:   He is at baseline.  He does not look particularly depressed.  Interestingly, he could not find his room when I came to see him.  He could not tell me the name of his room.  He is not orientated to the year.  He is aware that his wife was in the building several years ago, but he cannot give me a sense of when that was.    ASSESSMENT/PLAN:            COPD.  This is stable.    He sats at 99% on 2 L.  However, he appears to not be bothered by the oxygen.    History of diastolic heart failure with a history of ischemic cardiomyopathy.   Most recently, his weight was 211 pounds.  He does not seem to have a lot of edema.    Multiple GI issues.  He follows with Gastroenterology.  He was worked up earlier this year for, I guess, diarrhea.  His last colonoscopy was in 2014.  He has a remote history of colon cancer with the next surveillance colonoscopy in 2019.    Type 2 diabetes with  chronic renal insufficiency.  Last creatinine in June was 1.4.  I do not actually see a recent hemoglobin A1c other than one on 05/24/2014 that was 7.6, which is probably satisfactory for him.     Acute delirium.   We reviewed this last month.  We did not find an obvious cause.  His mental status today is compatible with dementia, which is probably mild to moderate.  I wonder whether we have simply missed/underestimated this.  A follow-up Folstein mini-mental status is probably in order.     CPT CODE: 44010

## 2014-11-13 ENCOUNTER — Non-Acute Institutional Stay (SKILLED_NURSING_FACILITY): Payer: Medicare Other | Admitting: Internal Medicine

## 2014-11-13 DIAGNOSIS — I509 Heart failure, unspecified: Secondary | ICD-10-CM

## 2014-11-13 DIAGNOSIS — I255 Ischemic cardiomyopathy: Secondary | ICD-10-CM

## 2014-11-13 DIAGNOSIS — J449 Chronic obstructive pulmonary disease, unspecified: Secondary | ICD-10-CM | POA: Diagnosis not present

## 2014-11-14 ENCOUNTER — Other Ambulatory Visit: Payer: Self-pay

## 2014-11-14 ENCOUNTER — Telehealth: Payer: Self-pay | Admitting: Internal Medicine

## 2014-11-14 DIAGNOSIS — K824 Cholesterolosis of gallbladder: Secondary | ICD-10-CM

## 2014-11-14 NOTE — Telephone Encounter (Signed)
REPEAT GALLBLADDER U/S IN 2 YEARS CHECK POLYP

## 2014-11-14 NOTE — Telephone Encounter (Signed)
[facility] and they are aware of pt [place] appt on 11/24/2014 @ 1000

## 2014-11-19 NOTE — Progress Notes (Addendum)
Patient ID: Howard [name], male   DOB: 12-24-1929, 81 y.o.   MRN: [id number]                PROGRESS NOTE  DATE:  11/13/2014         FACILITY: [facility]                 LEVEL OF CARE:   SNF   Routine Visit   CHIEF COMPLAINT:  Routine visit/[facility] visit/review of [facility] records.      HISTORY OF PRESENT ILLNESS:  Mr. _____ is a gentleman with multiple medical issues.  He follows with a lot of outside consultants including GI, Cardiology, and Pulmonary.     He transferred to our service in February 2015.  I actually knew his wife as one of my patients for several years.    He really has not had any major issues in the last month or two that I am aware of.  He is mostly in the wheelchair with his oxygen on.  Propels himself around the facility.  There does not appear to be any new medical issues apparent.     PAST MEDICAL HISTORY/PROBLEM LIST:                      Coronary artery disease with a history of diastolic heart failure.     COPD.  On chronic oxygen.     Type 2 diabetes with chronic diabetic renal failure.    Plethora of GI issues including microscopic colitis, irritable bowel, chronic diarrhea with a history of C.diff, gastroesophageal reflux, and a Schatzki's ring.    Generalized anxiety with depression.    Insomnia.    Osteoarthritis.    Acute delirium in June of this year, the cause of which was never really clear.    CURRENT MEDICATIONS:  Medication list is reviewed.               Plavix 75 q.d.      Spiriva 1 puff b.i.d.      Prevacid 30 q.d.       Demadex 40 mg daily.      MiraLAX 17 g daily.     Cymbalta 30 q.d.       Zocor 40 q.d.      Ativan 0.5 at 6:30 in the morning.    Lisinopril 2.5 q.d.      Micro-K 10 q.d.      Sinemet 500 daily.     Ferrous sulfate 325 b.i.d.      Coreg 3.125 b.i.d.      Symbicort 160/4.5 b.i.d.     Ascorbic acid 500 b.i.d.    Norco 5/325, 1 p.o. b.i.d.      Antivert/meclizine 12.5 three times a day.      Systane ophthalmic.    Synthroid 25 daily.      Ventolin nebulizers four times daily.      Restoril 7.5 q.h.s.       Insulin Lispro (Humalog) 8 U a.c. meals three times a day.    Lantus 25 U at night.    Oxygen 2 L.      LABORATORY DATA:     11/06/2014:  TSH slightly elevated at 5.380.     08/10/2014:  CBC:  White count 10, hemoglobin 10.1, platelet count 256.     BUN 23, creatinine 1.42, estimated creatinine clearance of 45, sodium 142, potassium 3.5.    Albumin 3.7.     Free  T4 at 1.04.   Free T3 at 93.    Ammonia level normal at 75.    Liver function tests normal.    REVIEW OF SYSTEMS:    GENERAL:  No weight loss no fever HEENT:   No headache or dizziness.  In spite of the fact that he has complained about dizziness in the past, he never really mentions this.   CHEST/RESPIRATORY:  No cough.  No sputum.    CARDIAC:  No chest pain.   GI:  No abdominal pain.   Still episodic diarrhea.  No dysphagia is noted.    GU:  He does not complain of dysuria.    MUSCULOSKELETAL:  He is complaining of low back pain, which he says is chronic.   I note he does have a history of nephrolithiasis.  Also complaining of pain in both knees.   Apparently going out to see Orthopedics for intra-articular steroid.     CNS: no complaints of lateralizing weakness Mental Status: no major depressive complaints. Does admit to sadness wrt to his son's current issues  PHYSICAL EXAMINATION:   VITAL SIGNS:     TEMPERATURE:  97.4.     PULSE:  72.    RESPIRATIONS:  20.       BLOOD PRESSURE:  130/67.    02 SATURATIONS:  95% on 2 L.    CHEST/RESPIRATORY:  Fairly decent air entry bilaterally.    CARDIOVASCULAR:   CARDIAC:  Heart sounds are normal.  He appears to be euvolemic.       GASTROINTESTINAL:   ABDOMEN:  Very distended.  However, bowel sounds are positive.   GENITOURINARY:   BLADDER:  No suprapubic or costovertebral angle tenderness is noted.    CIRCULATION:   EDEMA/VARICOSITIES:   Extremities:  No edema.   PSYCHIATRIC:   MENTAL STATUS:  He follows with Psychiatry.  Does not appear to be overtly depressed.    ASSESSMENT/PLAN:                COPD.   On chronic oxygen.  This does not appear to be unstable.    History of coronary artery disease with diastolic heart failure.  This also seems to be stable.    Multitude of upper and lower GI issues.  Currently, they do not appear to be unstable.    Major depression.  He appears to be stable.  Last seen by Psychiatry on 10/25/2014.  His medications were not changed.     Widespread osteoarthritis.  I suspect this is most of the issue.  He also follows with Dr. Sharol Given for intra-articular steroid injections.    Type 2 diabetes with chronic renal insufficiency.   This does not appear to be unstable.   Hemoglobin A1c seems stable.

## 2014-11-24 ENCOUNTER — Ambulatory Visit (HOSPITAL_COMMUNITY)
Admission: RE | Admit: 2014-11-24 | Discharge: 2014-11-24 | Disposition: A | Discharge status: Home / Self Care | Payer: Medicare Other | Source: Ambulatory Visit | Attending: Gastroenterology | Admitting: Gastroenterology

## 2014-11-24 DIAGNOSIS — K8020 Calculus of gallbladder without cholecystitis without obstruction: Secondary | ICD-10-CM | POA: Diagnosis not present

## 2014-11-24 DIAGNOSIS — K824 Cholesterolosis of gallbladder: Secondary | ICD-10-CM

## 2014-11-29 NOTE — Progress Notes (Signed)
Quick Note:  Sludge and stones in gb. Difficult to visualize previously seen gb.  Consider another ruq u/s in 2 years for gb polyp. Please nic.  Can we find out if he ever completed H.pylori stool antigen around feb or march of this year. Would have been done at nursing home. ______

## 2014-12-06 NOTE — Progress Notes (Signed)
ON RECALL  °

## 2015-01-08 ENCOUNTER — Ambulatory Visit (INDEPENDENT_AMBULATORY_CARE_PROVIDER_SITE_OTHER): Payer: Medicare Other | Admitting: Internal Medicine

## 2015-01-08 ENCOUNTER — Encounter: Payer: Self-pay | Admitting: Internal Medicine

## 2015-01-08 VITALS — BP 128/62 | HR 72 | Ht 70.0 in | Wt 219.6 lb

## 2015-01-08 DIAGNOSIS — I5032 Chronic diastolic (congestive) heart failure: Secondary | ICD-10-CM | POA: Diagnosis not present

## 2015-01-08 DIAGNOSIS — I10 Essential (primary) hypertension: Secondary | ICD-10-CM

## 2015-01-08 NOTE — Assessment & Plan Note (Signed)
His blood pressure is well controlled. He will continue his current meds. I have encouraged the patient to lose weight.

## 2015-01-08 NOTE — Patient Instructions (Signed)
Your physician wants you to follow-up in: 1 year with Dr. Lovena Le. You will receive a reminder letter in the mail two months in advance. If you don't receive a letter, please call our office to schedule the follow-up appointment.  Your physician recommends that you continue on your current medications as directed. Please refer to the Current Medication list given to you today.  May take an extra Demadex if needed for leg swelling  Thank you for choosing [facility]!

## 2015-01-08 NOTE — Progress Notes (Signed)
HPI Mr. Hackert returns today for followup. He is a very pleasant 81 year old man with a history of coronary artery disease, status post stenting just over a year ago. He has dyslipidemia, and hypertension. He has diastolic heart failure. He is diabetic. Despite his multiple problems, he is stable from a cardiovascular perspective.  He denies chest pain, shortness of breath, or peripheral edema. He is bothered by arthritis and is now mostly in a wheelchair but he can transfer. No syncope. He denies fevers or chills. No Known Allergies   Current Outpatient Prescriptions  Medication Sig Dispense Refill  . albuterol (PROVENTIL) (2.5 MG/3ML) 0.083% nebulizer solution Take 2.5 mg by nebulization 4 (four) times daily.    . budesonide-formoterol (SYMBICORT) 160-4.5 MCG/ACT inhaler Inhale 2 puffs into the lungs 2 (two) times daily.    . carvedilol (COREG) 3.125 MG tablet Take 3.125 mg by mouth 2 (two) times daily with a meal.     . Cinnamon 500 MG TABS Take 500 mg by mouth daily.    . clopidogrel (PLAVIX) 75 MG tablet Take 1 tablet (75 mg total) by mouth daily. 90 tablet 3  . DULoxetine (CYMBALTA) 30 MG capsule Take 30 mg by mouth daily.    . ferrous sulfate 325 (65 FE) MG EC tablet Take 325 mg by mouth 2 (two) times daily with a meal.    . insulin aspart (NOVOLOG) 100 UNIT/ML injection Inject 5 Units into the skin 3 (three) times daily before meals.    . insulin glargine (LANTUS) 100 UNIT/ML injection Inject 20 Units into the skin at bedtime.     . lansoprazole (PREVACID) 30 MG capsule Take 1 capsule (30 mg total) by mouth daily before breakfast. 30 capsule 11  . loperamide (IMODIUM A-D) 2 MG tablet Take 2 mg by mouth daily as needed for diarrhea or loose stools.     Marland Kitchen LORazepam (ATIVAN) 0.5 MG tablet Take 0.5 mg by mouth every 4 (four) hours as needed.     . meclizine (ANTIVERT) 12.5 MG tablet Take 12.5 mg by mouth 3 (three) times daily.    . polyethylene glycol (MIRALAX / GLYCOLAX) packet Take 17 g by  mouth daily.    . potassium chloride SA (K-DUR,KLOR-CON) 20 MEQ tablet Take 20 mEq by mouth daily.    Marland Kitchen Propylene Glycol (SYSTANE BALANCE) 0.6 % SOLN Apply 1 drop to eye 2 (two) times daily.    . simvastatin (ZOCOR) 40 MG tablet Take 40 mg by mouth every evening.    . temazepam (RESTORIL) 15 MG capsule Take 15 mg by mouth at bedtime as needed for sleep.    Marland Kitchen tiotropium (SPIRIVA) 18 MCG inhalation capsule Place 18 mcg into inhaler and inhale daily.    Marland Kitchen torsemide (DEMADEX) 20 MG tablet Take 2 tablets (40 mg total) by mouth daily. 60 tablet 6  . traMADol (ULTRAM) 50 MG tablet Take 1 tablet (50 mg total) by mouth every 6 (six) hours as needed. 30 tablet 0   No current facility-administered medications for this visit.     Past Medical History  Diagnosis Date  . Hypertension   . Hyperlipidemia   . Adenocarcinoma ([place]) 2003    COLON  . PVD (peripheral vascular disease) ([place])     ABI 0.78 RIGHT AND 0.80 LEFT 4/11  . Osteoarthritis     OF KNEES  . Kidney stone   . Schatzki's ring 08/05/10    egd with Dr. Gala Romney  . Hiatal hernia 08/05/10    egd with Dr.  Rourk  . Esophageal dysmotility   . Microscopic colitis   . Internal hemorrhoids   . Diverticula of colon   . CHF (congestive heart failure) ([place])   . Coronary artery stenosis     a. 06/2011 Cath: LM 20, LAD min irregs, D1 95 - small,  LCX 60p, RCA 48m, 50d, PLA 50ost;  b. 06/2011 PCI/Rota  RCA  -> 3.0x53mm Promus Element DES  . Angina   . COPD (chronic obstructive pulmonary disease) ([place])   . Asthma   . Pneumonia ~ 2005; 09/2005  . Shortness of breath 07/17/11    "laying down"  . Type II diabetes mellitus ([place])   . GERD (gastroesophageal reflux disease)   . Anxiety   . Ischemic cardiomyopathy     a. 05/2011 Echo: EF 45-50%, inf/post HK  . H. pylori infection 03/01/12    treated with prevpac  . Tubular adenoma   . Hyperplastic polyp of intestine     ROS:   All systems reviewed and negative except as noted in the HPI.   Past  Surgical History  Procedure Laterality Date  . Hemicolectomy  2003  . Appendectomy    . Colonoscopy  04/2001    Dr. Laural Golden- colon carcinoma  . Esophagogastroduodenoscopy  04/2001    Dr. Laural Golden  . Esophagogastroduodenoscopy  08/05/10    Dr. Evalee Mutton ring, hiatal hernia  . Colonoscopy  09/04/09    Dr. Matthew Folks, hemorrhoids  . Atherectomy  07/17/11  . Coronary angioplasty  07/17/11    rotablator  . Colon surgery    . Cataract extraction, bilateral    . Esophagogastroduodenoscopy  03/01/2012    Dr. Gala Romney- gastric erosions, small hiatal hernia, +hpylori,=treated with prevpac  . Colonoscopy  03/11/2012    Dr. Sabino Gasser R hemicolectomy, pancolonic diverticulosis, tubular adenoma, hyperplastic polyp  . Esophagogastroduodenoscopy N/A 08/11/2013    [id number] Schatzki's ring- s/p dilation as described above. Some retained gastric contents-query delay in gastric emptying. Abnormal gastric mucosa- s/p gastric biopsy (chronic gastritis, intestional metaplasia, no definite h.pylori)  . Savory dilation N/A 08/11/2013    Procedure: SAVORY DILATION;  Surgeon: Daneil Dolin, MD;  Location: [facility];  Service: Endoscopy;  Laterality: N/A;  Venia Minks dilation N/A 08/11/2013    Procedure: Venia Minks DILATION;  Surgeon: Daneil Dolin, MD;  Location: [facility];  Service: Endoscopy;  Laterality: N/A;  . Percutaneous coronary stent intervention (pci-s) N/A 07/17/2011    Procedure: PERCUTANEOUS CORONARY STENT INTERVENTION (PCI-S);  Surgeon: Burnell Blanks, MD;  Location: [facility] CATH LAB;  Service: Cardiovascular;  Laterality: N/A;     Family History  Problem Relation Age of Onset  . Coronary artery disease Father   . Coronary artery disease Brother     CABG  . Diabetes Brother   . Coronary artery disease Brother   . Heart Problems Mother      Social History   Social History  . Marital Status: Married    Spouse Name: N/A  . Number of Children: N/A  . Years of Education: N/A    Occupational History  . Not on file.   Social History Main Topics  . Smoking status: Former Smoker -- 20 years    Types: Cigarettes    Quit date: 02/25/2008  . Smokeless tobacco: Never Used  . Alcohol Use: No  . Drug Use: No  . Sexual Activity: Not Currently   Other Topics Concern  . Not on file   Social History Narrative   Lives in [place]. Married.  Wife in [place].   Son lives with him   Caffeine Use-     BP 128/62 mmHg  Pulse 72  Ht 5\' 10"  (1.778 m)  Wt 219 lb 9.6 oz (99.61 kg)  BMI 31.51 kg/m2  SpO2 93%  Physical Exam:  stable appearing elderly man, NAD HEENT: Unremarkable Neck:  7 cm JVD,diskempt appearing, no thyromegally Lungs:  Clear with no wheezes, rales, or rhonchi. HEART:  Regular rate rhythm, no murmurs, no rubs, no clicks, heart sounds are distant. Abd:  soft, soft, obese,positive bowel sounds, no organomegally, no rebound, no guarding Ext:  2 plus pulses, no edema, no cyanosis, no clubbing Skin:  No rashes no nodules Neuro:  CN II through XII intact, motor grossly intact    Assess/Plan:

## 2015-01-08 NOTE — Assessment & Plan Note (Signed)
He is class 2. He may take an extra demadex if his symptoms worsen and as needed.

## 2015-04-19 ENCOUNTER — Encounter: Payer: Self-pay | Admitting: Nurse Practitioner

## 2015-04-19 ENCOUNTER — Ambulatory Visit (INDEPENDENT_AMBULATORY_CARE_PROVIDER_SITE_OTHER): Payer: Medicare Other | Admitting: Nurse Practitioner

## 2015-04-19 ENCOUNTER — Encounter: Payer: Self-pay | Admitting: Internal Medicine

## 2015-04-19 VITALS — BP 126/58 | HR 91 | Temp 97.3°F | Ht 69.0 in | Wt 221.2 lb

## 2015-04-19 DIAGNOSIS — R1084 Generalized abdominal pain: Secondary | ICD-10-CM

## 2015-04-19 DIAGNOSIS — R109 Unspecified abdominal pain: Secondary | ICD-10-CM | POA: Insufficient documentation

## 2015-04-19 NOTE — Progress Notes (Signed)
CC'D TO PCP °

## 2015-04-19 NOTE — Progress Notes (Signed)
Referring Provider: Ricard Dillon, MD Primary Care Physician:  Cleda Mccreedy, MD Primary GI:  Dr. Gala Romney  Chief Complaint  Patient presents with  . Abdominal Pain    HPI:   Howard [name] is a 82 y.o. male who resides at [facility] and presents for abdominal pain. Last seen in our office 04/24/2014. Previous EGD showed chronic gastritis, no definitive H. pylori, subtle Schatzki's ring status post dilation. Question gastroparesis. Remote history of colon cancer with last colonoscopy in 2014 with tubular adenoma and recommended next surveillance 2019. Ultrasound gallbladder shows polyp and recommend repeat ultrasound in 2018 for surveillance. HIDA scan demonstrated gallbladder EF at 28%. Also history of remote microscopic colitis. At that time H. pylori stool antigen test was ordered, stool studies ordered. No results of labs could be found in the system or in a nursing home documentation.  Patient is a very difficult historian. Today he states he has spells of lower abdominal pain which is crampy. Pain triggers him to have a bowel movement, pain typically better after a bowel movement. Stools sometimes hard. Requires straining. Sometimes not. Denies hematochezia and melena. Has some nausea. Denies vomiting. Staff who is accompanying patient states he will eat 100% of meals. Very seldom diarrhea. Denies other abdominal complaints.   Past Medical History  Diagnosis Date  . Hypertension   . Hyperlipidemia   . Adenocarcinoma ([place]) 2003    COLON  . PVD (peripheral vascular disease) ([place])     ABI 0.78 RIGHT AND 0.80 LEFT 4/11  . Osteoarthritis     OF KNEES  . Kidney stone   . Schatzki's ring 08/05/10    egd with Dr. Gala Romney  . Hiatal hernia 08/05/10    egd with Dr. Gala Romney  . Esophageal dysmotility   . Microscopic colitis   . Internal hemorrhoids   . Diverticula of colon   . CHF (congestive heart failure) ([place])   . Coronary artery stenosis     a. 06/2011 Cath: LM 20, LAD min irregs,  D1 95 - small,  LCX 60p, RCA 52m, 50d, PLA 50ost;  b. 06/2011 PCI/Rota  RCA  -> 3.0x85mm Promus Element DES  . Angina   . COPD (chronic obstructive pulmonary disease) ([place])   . Asthma   . Pneumonia ~ 2005; 09/2005  . Shortness of breath 07/17/11    "laying down"  . Type II diabetes mellitus ([place])   . GERD (gastroesophageal reflux disease)   . Anxiety   . Ischemic cardiomyopathy     a. 05/2011 Echo: EF 45-50%, inf/post HK  . H. pylori infection 03/01/12    treated with prevpac  . Tubular adenoma   . Hyperplastic polyp of intestine     Past Surgical History  Procedure Laterality Date  . Hemicolectomy  2003  . Appendectomy    . Colonoscopy  04/2001    Dr. Laural Golden- colon carcinoma  . Esophagogastroduodenoscopy  04/2001    Dr. Laural Golden  . Esophagogastroduodenoscopy  08/05/10    Dr. Evalee Mutton ring, hiatal hernia  . Colonoscopy  09/04/09    Dr. Matthew Folks, hemorrhoids  . Atherectomy  07/17/11  . Coronary angioplasty  07/17/11    rotablator  . Colon surgery    . Cataract extraction, bilateral    . Esophagogastroduodenoscopy  03/01/2012    Dr. Gala Romney- gastric erosions, small hiatal hernia, +hpylori,=treated with prevpac  . Colonoscopy  03/11/2012    Dr. Sabino Gasser R hemicolectomy, pancolonic diverticulosis, tubular adenoma, hyperplastic polyp  . Esophagogastroduodenoscopy N/A 08/11/2013  [id number] Schatzki's ring- s/p dilation as described above. Some retained gastric contents-query delay in gastric emptying. Abnormal gastric mucosa- s/p gastric biopsy (chronic gastritis, intestional metaplasia, no definite h.pylori)  . Savory dilation N/A 08/11/2013    Procedure: SAVORY DILATION;  Surgeon: Daneil Dolin, MD;  [facility];  Service: Endoscopy;  Laterality: N/A;  Venia Minks dilation N/A 08/11/2013    Procedure: Venia Minks DILATION;  Surgeon: Daneil Dolin, MD;  Location: [facility];  Service: Endoscopy;  Laterality: N/A;  . Percutaneous coronary stent intervention (pci-s) N/A  07/17/2011    Procedure: PERCUTANEOUS CORONARY STENT INTERVENTION (PCI-S);  Surgeon: Burnell Blanks, MD;  Location: [facility] CATH LAB;  Service: Cardiovascular;  Laterality: N/A;    Current Outpatient Prescriptions  Medication Sig Dispense Refill  . budesonide-formoterol (SYMBICORT) 160-4.5 MCG/ACT inhaler Inhale 2 puffs into the lungs 2 (two) times daily.    . carvedilol (COREG) 3.125 MG tablet Take 3.125 mg by mouth 2 (two) times daily with a meal.     . Cinnamon 500 MG TABS Take 500 mg by mouth daily.    . clopidogrel (PLAVIX) 75 MG tablet Take 1 tablet (75 mg total) by mouth daily. 90 tablet 3  . DULoxetine (CYMBALTA) 30 MG capsule Take 30 mg by mouth daily.    . ferrous sulfate 325 (65 FE) MG EC tablet Take 325 mg by mouth 2 (two) times daily with a meal.    . insulin aspart (NOVOLOG) 100 UNIT/ML injection Inject 5 Units into the skin 3 (three) times daily before meals.    . insulin glargine (LANTUS) 100 UNIT/ML injection Inject 20 Units into the skin at bedtime.     . lansoprazole (PREVACID) 30 MG capsule Take 1 capsule (30 mg total) by mouth daily before breakfast. 30 capsule 11  . lisinopril (PRINIVIL,ZESTRIL) 2.5 MG tablet Take 2.5 mg by mouth daily.    Marland Kitchen loperamide (IMODIUM A-D) 2 MG tablet Take 2 mg by mouth daily as needed for diarrhea or loose stools.     Marland Kitchen LORazepam (ATIVAN) 0.5 MG tablet Take 0.5 mg by mouth every 4 (four) hours as needed.     . meclizine (ANTIVERT) 12.5 MG tablet Take 12.5 mg by mouth 3 (three) times daily.    . polyethylene glycol (MIRALAX / GLYCOLAX) packet Take 17 g by mouth daily.    . potassium chloride SA (K-DUR,KLOR-CON) 20 MEQ tablet Take 20 mEq by mouth daily.    Marland Kitchen Propylene Glycol (SYSTANE BALANCE) 0.6 % SOLN Apply 1 drop to eye 2 (two) times daily.    . simvastatin (ZOCOR) 40 MG tablet Take 40 mg by mouth every evening.    . temazepam (RESTORIL) 15 MG capsule Take 15 mg by mouth at bedtime as needed for sleep.    Marland Kitchen tiotropium (SPIRIVA) 18 MCG  inhalation capsule Place 18 mcg into inhaler and inhale daily.    Marland Kitchen torsemide (DEMADEX) 20 MG tablet Take 2 tablets (40 mg total) by mouth daily. 60 tablet 6  . traMADol (ULTRAM) 50 MG tablet Take 1 tablet (50 mg total) by mouth every 6 (six) hours as needed. 30 tablet 0  . albuterol (PROVENTIL) (2.5 MG/3ML) 0.083% nebulizer solution Take 2.5 mg by nebulization 4 (four) times daily. Reported on 04/19/2015     No current facility-administered medications for this visit.    Allergies as of 04/19/2015  . (No Known Allergies)    Family History  Problem Relation Age of Onset  . Coronary artery disease Father   .  Coronary artery disease Brother     CABG  . Diabetes Brother   . Coronary artery disease Brother   . Heart Problems Mother     Social History   Social History  . Marital Status: Married    Spouse Name: N/A  . Number of Children: N/A  . Years of Education: N/A   Social History Main Topics  . Smoking status: Former Smoker -- 20 years    Types: Cigarettes    Quit date: 02/25/2008  . Smokeless tobacco: Never Used  . Alcohol Use: No  . Drug Use: No  . Sexual Activity: Not Currently   Other Topics Concern  . None   Social History Narrative   Lives in [place]. Married. Wife in [place].   Son lives with him   Caffeine Use-    Review of Systems: Very difficult historian General: Negative for weakness. CV: Negative for chest pain, angina, palpitations, peripheral edema.  Respiratory: Negative for dyspnea at rest, cough, sputum, wheezing.  GI: See history of present illness. Endo: Negative for unusual weight change (per facility records).     Physical Exam: BP 126/58 mmHg  Pulse 91  Temp(Src) 97.3 F (36.3 C) (Oral)  Ht 5\' 9"  (1.753 m)  Wt 221 lb 3.2 oz (100.336 kg)  BMI 32.65 kg/m2 General:   Alert and oriented. Pleasant and cooperative. Sitting in wheelchair, difficult to understand at times.  Head:  Normocephalic and atraumatic. Cardiovascular:  S1, S2  present without murmurs appreciated. Extremities without clubbing or edema. Respiratory:  Clear to auscultation bilaterally. No wheezes, rales, or rhonchi. No distress.  Gastrointestinal:  +BS, soft, and non-distended. Mild TTP generalized abdomen. No HSM noted. No guarding or rebound. No masses appreciated.  Rectal:  Deferred  Psych:  Alert and cooperative. Normal mood and affect. Heme/Lymph/Immune: No excessive bruising noted.    04/19/2015 9:38 AM

## 2015-04-19 NOTE — Assessment & Plan Note (Signed)
Patient sent for abdominal pain which appears to be chronic. He is a very difficult historian and it is hard to extract symptoms from him. Based on conversation seems like he may be having worsening constipation and subsequent abdominal pain. He is on MiraLAX once a day currently. Facility had an abdominal x-ray done but this was not included with his notes for his abdominal pain appointment. We will request the x-ray report. Based on this may need to change from MiraLAX to another constipation medication or possibly add a fiber supplement. Further recommendations to be based on his x-ray report.

## 2015-04-19 NOTE — Patient Instructions (Signed)
1. We'll call you with further recommendations. 2. Return for follow-up in 2 months.

## 2015-05-14 ENCOUNTER — Other Ambulatory Visit (HOSPITAL_COMMUNITY): Payer: Self-pay | Admitting: Nurse Practitioner

## 2015-05-14 DIAGNOSIS — R1084 Generalized abdominal pain: Secondary | ICD-10-CM

## 2015-05-18 ENCOUNTER — Ambulatory Visit (HOSPITAL_COMMUNITY)
Admission: RE | Admit: 2015-05-18 | Discharge: 2015-05-18 | Disposition: A | Discharge status: Home / Self Care | Payer: Medicare Other | Source: Ambulatory Visit | Attending: Nurse Practitioner | Admitting: Nurse Practitioner

## 2015-05-18 DIAGNOSIS — R109 Unspecified abdominal pain: Secondary | ICD-10-CM | POA: Insufficient documentation

## 2015-05-18 DIAGNOSIS — Z85038 Personal history of other malignant neoplasm of large intestine: Secondary | ICD-10-CM | POA: Insufficient documentation

## 2015-05-18 DIAGNOSIS — K8020 Calculus of gallbladder without cholecystitis without obstruction: Secondary | ICD-10-CM | POA: Insufficient documentation

## 2015-05-18 DIAGNOSIS — R1084 Generalized abdominal pain: Secondary | ICD-10-CM

## 2015-05-18 DIAGNOSIS — K869 Disease of pancreas, unspecified: Secondary | ICD-10-CM | POA: Diagnosis not present

## 2015-05-18 DIAGNOSIS — R140 Abdominal distension (gaseous): Secondary | ICD-10-CM | POA: Diagnosis not present

## 2015-05-18 DIAGNOSIS — K760 Fatty (change of) liver, not elsewhere classified: Secondary | ICD-10-CM | POA: Diagnosis not present

## 2015-05-18 MED ORDER — IOHEXOL 300 MG/ML  SOLN
100.0000 mL | Freq: Once | INTRAMUSCULAR | Status: AC | PRN
  Administered 2015-05-18: 100 mL via INTRAVENOUS

## 2015-05-22 ENCOUNTER — Telehealth: Payer: Self-pay | Admitting: Nurse Practitioner

## 2015-05-22 NOTE — Telephone Encounter (Signed)
Call received from Enis Gash, NP at [facility]. Followed constipation recommendations from our last visit, patient kept complaining of abdominal pain. She ordered a CT which was completed on 05/18/15 and showed 3.1 x 1.7 x 2.4 cm new mass in the pancreatic head consistent with primary pancreatic carcinoma without noted metastasis. She asked if the patient should be sent back here. Recommended she refer patient for urgent evaluation at [facility] for further workup and recommendations.

## 2015-05-28 ENCOUNTER — Encounter (HOSPITAL_COMMUNITY): Payer: Self-pay | Admitting: Oncology

## 2015-05-28 ENCOUNTER — Encounter (HOSPITAL_COMMUNITY): Payer: Medicare Other

## 2015-05-28 ENCOUNTER — Encounter (HOSPITAL_COMMUNITY): Payer: Medicare Other | Attending: Oncology | Admitting: Oncology

## 2015-05-28 VITALS — BP 145/80 | HR 92 | Temp 97.7°F | Resp 22 | Ht 70.0 in | Wt 225.0 lb

## 2015-05-28 DIAGNOSIS — I255 Ischemic cardiomyopathy: Secondary | ICD-10-CM | POA: Diagnosis not present

## 2015-05-28 DIAGNOSIS — I10 Essential (primary) hypertension: Secondary | ICD-10-CM | POA: Diagnosis not present

## 2015-05-28 DIAGNOSIS — Z87891 Personal history of nicotine dependence: Secondary | ICD-10-CM | POA: Diagnosis not present

## 2015-05-28 DIAGNOSIS — K449 Diaphragmatic hernia without obstruction or gangrene: Secondary | ICD-10-CM | POA: Insufficient documentation

## 2015-05-28 DIAGNOSIS — M4316 Spondylolisthesis, lumbar region: Secondary | ICD-10-CM | POA: Diagnosis not present

## 2015-05-28 DIAGNOSIS — D649 Anemia, unspecified: Secondary | ICD-10-CM

## 2015-05-28 DIAGNOSIS — Z833 Family history of diabetes mellitus: Secondary | ICD-10-CM | POA: Diagnosis not present

## 2015-05-28 DIAGNOSIS — I5032 Chronic diastolic (congestive) heart failure: Secondary | ICD-10-CM | POA: Diagnosis not present

## 2015-05-28 DIAGNOSIS — R109 Unspecified abdominal pain: Secondary | ICD-10-CM

## 2015-05-28 DIAGNOSIS — E785 Hyperlipidemia, unspecified: Secondary | ICD-10-CM | POA: Diagnosis not present

## 2015-05-28 DIAGNOSIS — F0390 Unspecified dementia without behavioral disturbance: Secondary | ICD-10-CM

## 2015-05-28 DIAGNOSIS — K8689 Other specified diseases of pancreas: Secondary | ICD-10-CM | POA: Insufficient documentation

## 2015-05-28 DIAGNOSIS — K219 Gastro-esophageal reflux disease without esophagitis: Secondary | ICD-10-CM | POA: Diagnosis not present

## 2015-05-28 DIAGNOSIS — I739 Peripheral vascular disease, unspecified: Secondary | ICD-10-CM | POA: Insufficient documentation

## 2015-05-28 DIAGNOSIS — Z8489 Family history of other specified conditions: Secondary | ICD-10-CM | POA: Diagnosis not present

## 2015-05-28 DIAGNOSIS — E119 Type 2 diabetes mellitus without complications: Secondary | ICD-10-CM | POA: Diagnosis not present

## 2015-05-28 DIAGNOSIS — K869 Disease of pancreas, unspecified: Secondary | ICD-10-CM | POA: Diagnosis present

## 2015-05-28 DIAGNOSIS — Z79899 Other long term (current) drug therapy: Secondary | ICD-10-CM | POA: Insufficient documentation

## 2015-05-28 DIAGNOSIS — J45909 Unspecified asthma, uncomplicated: Secondary | ICD-10-CM | POA: Diagnosis not present

## 2015-05-28 DIAGNOSIS — I5030 Unspecified diastolic (congestive) heart failure: Secondary | ICD-10-CM

## 2015-05-28 DIAGNOSIS — J449 Chronic obstructive pulmonary disease, unspecified: Secondary | ICD-10-CM | POA: Diagnosis not present

## 2015-05-28 DIAGNOSIS — Z794 Long term (current) use of insulin: Secondary | ICD-10-CM | POA: Insufficient documentation

## 2015-05-28 DIAGNOSIS — D72829 Elevated white blood cell count, unspecified: Secondary | ICD-10-CM | POA: Diagnosis not present

## 2015-05-28 DIAGNOSIS — Z87442 Personal history of urinary calculi: Secondary | ICD-10-CM | POA: Insufficient documentation

## 2015-05-28 DIAGNOSIS — Z9889 Other specified postprocedural states: Secondary | ICD-10-CM | POA: Diagnosis not present

## 2015-05-28 DIAGNOSIS — I2510 Atherosclerotic heart disease of native coronary artery without angina pectoris: Secondary | ICD-10-CM | POA: Insufficient documentation

## 2015-05-28 DIAGNOSIS — M1990 Unspecified osteoarthritis, unspecified site: Secondary | ICD-10-CM | POA: Insufficient documentation

## 2015-05-28 DIAGNOSIS — J9610 Chronic respiratory failure, unspecified whether with hypoxia or hypercapnia: Secondary | ICD-10-CM

## 2015-05-28 HISTORY — DX: Other specified diseases of pancreas: K86.89

## 2015-05-28 LAB — COMPREHENSIVE METABOLIC PANEL
ALBUMIN: 3.7 g/dL (ref 3.5–5.0)
ALK PHOS: 75 U/L (ref 38–126)
ALT: 29 U/L (ref 17–63)
AST: 28 U/L (ref 15–41)
Anion gap: 9 (ref 5–15)
BILIRUBIN TOTAL: 0.5 mg/dL (ref 0.3–1.2)
BUN: 24 mg/dL — ABNORMAL HIGH (ref 6–20)
CALCIUM: 8.6 mg/dL — AB (ref 8.9–10.3)
CO2: 29 mmol/L (ref 22–32)
CREATININE: 1.23 mg/dL (ref 0.61–1.24)
Chloride: 97 mmol/L — ABNORMAL LOW (ref 101–111)
GFR calc Af Amer: >60 mL/min (ref >60)
GFR calc non Af Amer: 52 mL/min — ABNORMAL LOW (ref >60)
Glucose, Bld: 153 mg/dL — ABNORMAL HIGH (ref 65–99)
Potassium: 4.2 mmol/L (ref 3.5–5.1)
Sodium: 135 mmol/L (ref 135–145)
TOTAL PROTEIN: 6.9 g/dL (ref 6.5–8.1)

## 2015-05-28 LAB — CBC WITH DIFFERENTIAL/PLATELET
BASOS PCT: 0 %
Basophils Absolute: 0 10*3/uL (ref 0.0–0.1)
Eosinophils Absolute: 0.2 10*3/uL (ref 0.0–0.7)
Eosinophils Relative: 2 %
HEMATOCRIT: 29.9 % — AB (ref 39.0–52.0)
HEMOGLOBIN: 10 g/dL — AB (ref 13.0–17.0)
Lymphocytes Relative: 12 %
Lymphs Abs: 1.4 10*3/uL (ref 0.7–4.0)
MCH: 34.7 pg — ABNORMAL HIGH (ref 26.0–34.0)
MCHC: 33.4 g/dL (ref 30.0–36.0)
MCV: 103.8 fL — ABNORMAL HIGH (ref 78.0–100.0)
MONOS PCT: 11 %
Monocytes Absolute: 1.3 10*3/uL — ABNORMAL HIGH (ref 0.1–1.0)
NEUTROS ABS: 8.9 10*3/uL — AB (ref 1.7–7.7)
NEUTROS PCT: 75 %
Platelets: 210 10*3/uL (ref 150–400)
RBC: 2.88 MIL/uL — AB (ref 4.22–5.81)
RDW: 15.1 % (ref 11.5–15.5)
WBC: 11.9 10*3/uL — AB (ref 4.0–10.5)

## 2015-05-28 LAB — RETICULOCYTES
RBC.: 2.88 MIL/uL — ABNORMAL LOW (ref 4.22–5.81)
RETIC CT PCT: 2.1 % (ref 0.4–3.1)
Retic Count, Absolute: 60.5 10*3/uL (ref 19.0–186.0)

## 2015-05-28 LAB — FOLATE: Folate: 27.7 ng/mL (ref >5.9)

## 2015-05-28 LAB — IRON AND TIBC
IRON: 77 ug/dL (ref 45–182)
Saturation Ratios: 21 % (ref 17.9–39.5)
TIBC: 367 ug/dL (ref 250–450)
UIBC: 290 ug/dL

## 2015-05-28 LAB — VITAMIN B12: VITAMIN B 12: 550 pg/mL (ref 180–914)

## 2015-05-28 LAB — FERRITIN: Ferritin: 78 ng/mL (ref 24–336)

## 2015-05-28 NOTE — Patient Instructions (Signed)
[place] at [facility] Discharge Instructions  RECOMMENDATIONS MADE BY THE CONSULTANT AND ANY TEST RESULTS WILL BE SENT TO YOUR REFERRING PHYSICIAN.  Exam done and seen today by Kirby Crigler Getting labs done today. We are referrring you to Dr. Ardis Hughs Return to see the Doctor in 3 weeks Call the clinic for any concerns or questions.   Thank you for choosing [place] at [facility] to provide your oncology and hematology care.  To afford each patient quality time with our provider, please arrive at least 15 minutes before your scheduled appointment time.   Beginning January 23rd 2017 lab work for the [facility] will be done in the  Main lab at [facility] on 1st floor. If you have a lab appointment with the [facility] please come in thru the  Main Entrance and check in at the main information desk  You need to re-schedule your appointment should you arrive 10 or more minutes late.  We strive to give you quality time with our providers, and arriving late affects you and other patients whose appointments are after yours.  Also, if you no show three or more times for appointments you may be dismissed from the clinic at the providers discretion.     Again, thank you for choosing [facility].  Our hope is that these requests will decrease the amount of time that you wait before being seen by our physicians.       _____________________________________________________________  Should you have questions after your visit to [facility], please contact our office at [phone] between the hours of 8:30 a.m. and 4:30 p.m.  Voicemails left after 4:30 p.m. will not be returned until the following business day.  For prescription refill requests, have your pharmacy contact our office.         Resources For Cancer Patients and their Caregivers ? American Cancer Society: Can assist with transportation, wigs, general  needs, runs Look Good Feel Better.        (912) 296-4796 ? Cancer Care: Provides financial assistance, online support groups, medication/co-pay assistance.  1-800-813-HOPE 347-364-2320) ? [place] Assists [place] cancer patients and their families through emotional , educational and financial support.  854-781-2382 ? [place] DSS Where to apply for food stamps, Medicaid and utility assistance. (901) 342-5248 ? [facility]: Transportation to medical appointments. 978-272-1560 ? Social Security Administration: May apply for disability if have a Stage IV cancer. 3148534188 [phone] ? [employer], Disability and Transit Services: Assists with nutrition, care and transit needs. [phone]

## 2015-05-28 NOTE — Assessment & Plan Note (Signed)
No recent labs available to me that would be beneficial at this time.  Macrocytic anemia dates back to at least 2014 with anemia dating back to at least 2013.  Labs today: CBC diff, CMET, anemia panel, retic count, pathology smear review.

## 2015-05-28 NOTE — Progress Notes (Signed)
[facility] Hematology/Oncology Consultation   Name: Howard [name]      MRN: [id number]   Date: 05/28/2015 Time:5:08 PM   REFERRING PHYSICIAN:  Victorino December, NP  REASON FOR CONSULT:  Pancreatic mass   DIAGNOSIS:  3.1 x 1.7 x 2.4 cm (low-density) mass in the pancreatic head, concerning for primary pancreatic carcinoma.  HISTORY OF PRESENT ILLNESS:   Howard [name] is a 82 year old white American man who is a resident at [facility] with a past medical history significant for CAD (extensive), PVD/PAD (extensive), chronic spondylolisthesis at L5-S1, chronic respiratory failure, diastolic CHF, GERD, hyperlipidemia, ischemic cardiomyopathy, type II DM who is referred to [facility] urgently for newly discovered pancreatic mass. He also has an extensive GI history which includes a right hemicolectomy, pancolonic diverticulosis, tubular adenoma, hyperplastic polyp, and microscopic colitis. He also has a history of pseudomembranous colitis.   I personally reviewed and went over laboratory results with the patient.  The results are noted within this dictation.  No recent labs available to me that would be beneficial at this time.  Chronic leukocytosis dates back to at least 2014, macrocytic anemia dates back to at least 2014 with anemia dating back to at least 2013.  I personally reviewed and went over radiographic studies with the patient.  The results are noted within this dictation.  CT abd/pelvis w contrast performed on 05/18/2015 for abdominal pain demonstrated a 3.1 x 1.7 x 2.4 cm (low-density) mass in the pancreatic head, concerning for primary pancreatic carcinoma.  History difficult to ascertain secondary to patient's dementia and family members who accompany the patient are not clear on his medical history either.   (e: Me to the patient: "Do you have your gallbladder?"  Patient:"No, that's gone! Isn't it [looking at his son]?"  Son's response  is "I guess so.  Mine is gone.  If mine is gone, yours must be.")  He reports that his appetite is stable.  He notes that his weight fluctuates regularly secondary to his heart disease.  He admits to a decreased energy level x 6 months.  He reports light colored urine.  [name] who is his HCPOA has recently had a CVA and is unavailable at our visit today.   Review of Systems  Constitutional: Positive for malaise/fatigue. Negative for fever, chills and weight loss.  HENT: Negative.   Eyes: Negative.   Respiratory: Positive for shortness of breath.   Cardiovascular: Negative.   Gastrointestinal: Negative.   Genitourinary: Negative.   Musculoskeletal: Negative.   Skin: Negative.   Neurological: Negative.   Endo/Heme/Allergies: Negative.   Psychiatric/Behavioral: Negative.    The patient is educated regarding his imaging tests and the meaning of the findings.  His CT scan is concerning for pancreatic cancer.  We discussed this and what it may entail for the patient.  The patient seems to be interested in diagnosis confirmation.  The patient's family concurs.  The questionable aspect of this patient's case is his ability to be treated palliatively with chemotherapy (gemcitabine single agent versus gemcitabine/Abraxane).  Not pursuing treatment would certainly be reasonable given his performance status and co-morbidities.  PAST MEDICAL HISTORY:   Past Medical History  Diagnosis Date  . Hypertension   . Hyperlipidemia   . Adenocarcinoma ([place]) 2003    COLON  . PVD (peripheral vascular disease) ([place])     ABI 0.78 RIGHT AND 0.80 LEFT 4/11  . Osteoarthritis  OF KNEES  . Kidney stone   . Schatzki's ring 08/05/10    egd with Dr. Gala Romney  . Hiatal hernia 08/05/10    egd with Dr. Gala Romney  . Esophageal dysmotility   . Microscopic colitis   . Internal hemorrhoids   . Diverticula of colon   . CHF (congestive heart failure) ([place])   . Coronary artery stenosis     a. 06/2011 Cath: LM 20, LAD  min irregs, D1 95 - small,  LCX 60p, RCA 3m, 50d, PLA 50ost;  b. 06/2011 PCI/Rota  RCA  -> 3.0x38mm Promus Element DES  . Angina   . COPD (chronic obstructive pulmonary disease) ([place])   . Asthma   . Pneumonia ~ 2005; 09/2005  . Shortness of breath 07/17/11    "laying down"  . Type II diabetes mellitus ([place])   . GERD (gastroesophageal reflux disease)   . Anxiety   . Ischemic cardiomyopathy     a. 05/2011 Echo: EF 45-50%, inf/post HK  . H. pylori infection 03/01/12    treated with prevpac  . Tubular adenoma   . Hyperplastic polyp of intestine   . Pancreatic mass 05/28/2015    ALLERGIES: No Known Allergies    MEDICATIONS: I have reviewed the patient's current medications.    Current Outpatient Prescriptions on File Prior to Visit  Medication Sig Dispense Refill  . budesonide-formoterol (SYMBICORT) 160-4.5 MCG/ACT inhaler Inhale 2 puffs into the lungs 2 (two) times daily.    . carvedilol (COREG) 3.125 MG tablet Take 3.125 mg by mouth 2 (two) times daily with a meal.     . Cinnamon 500 MG TABS Take 500 mg by mouth daily.    . clopidogrel (PLAVIX) 75 MG tablet Take 1 tablet (75 mg total) by mouth daily. 90 tablet 3  . DULoxetine (CYMBALTA) 30 MG capsule Take 30 mg by mouth daily.    . ferrous sulfate 325 (65 FE) MG EC tablet Take 325 mg by mouth 2 (two) times daily with a meal.    . insulin aspart (NOVOLOG) 100 UNIT/ML injection Inject 5 Units into the skin 3 (three) times daily before meals.    . insulin glargine (LANTUS) 100 UNIT/ML injection Inject 20 Units into the skin at bedtime.     . lansoprazole (PREVACID) 30 MG capsule Take 1 capsule (30 mg total) by mouth daily before breakfast. 30 capsule 11  . lisinopril (PRINIVIL,ZESTRIL) 2.5 MG tablet Take 2.5 mg by mouth daily.    Marland Kitchen loperamide (IMODIUM A-D) 2 MG tablet Take 2 mg by mouth daily as needed for diarrhea or loose stools.     Marland Kitchen LORazepam (ATIVAN) 0.5 MG tablet Take 0.5 mg by mouth every 4 (four) hours as needed.     . meclizine  (ANTIVERT) 12.5 MG tablet Take 12.5 mg by mouth 3 (three) times daily.    . polyethylene glycol (MIRALAX / GLYCOLAX) packet Take 17 g by mouth daily.    . potassium chloride SA (K-DUR,KLOR-CON) 20 MEQ tablet Take 20 mEq by mouth daily.    Marland Kitchen Propylene Glycol (SYSTANE BALANCE) 0.6 % SOLN Apply 1 drop to eye 2 (two) times daily.    . simvastatin (ZOCOR) 40 MG tablet Take 40 mg by mouth every evening.    . temazepam (RESTORIL) 15 MG capsule Take 15 mg by mouth at bedtime as needed for sleep.    Marland Kitchen tiotropium (SPIRIVA) 18 MCG inhalation capsule Place 18 mcg into inhaler and inhale daily.    Marland Kitchen torsemide (DEMADEX) 20 MG  tablet Take 2 tablets (40 mg total) by mouth daily. 60 tablet 6  . traMADol (ULTRAM) 50 MG tablet Take 1 tablet (50 mg total) by mouth every 6 (six) hours as needed. 30 tablet 0   No current facility-administered medications on file prior to visit.     PAST SURGICAL HISTORY Past Surgical History  Procedure Laterality Date  . Hemicolectomy  2003  . Appendectomy    . Colonoscopy  04/2001    Dr. Laural Golden- colon carcinoma  . Esophagogastroduodenoscopy  04/2001    Dr. Laural Golden  . Esophagogastroduodenoscopy  08/05/10    Dr. Evalee Mutton ring, hiatal hernia  . Colonoscopy  09/04/09    Dr. Matthew Folks, hemorrhoids  . Atherectomy  07/17/11  . Coronary angioplasty  07/17/11    rotablator  . Colon surgery    . Cataract extraction, bilateral    . Esophagogastroduodenoscopy  03/01/2012    Dr. Gala Romney- gastric erosions, small hiatal hernia, +hpylori,=treated with prevpac  . Colonoscopy  03/11/2012    Dr. Sabino Gasser R hemicolectomy, pancolonic diverticulosis, tubular adenoma, hyperplastic polyp  . Esophagogastroduodenoscopy N/A 08/11/2013    [id number] Schatzki's ring- s/p dilation as described above. Some retained gastric contents-query delay in gastric emptying. Abnormal gastric mucosa- s/p gastric biopsy (chronic gastritis, intestional metaplasia, no definite h.pylori)  . Savory dilation N/A  08/11/2013    Procedure: SAVORY DILATION;  Surgeon: Daneil Dolin, MD;  Location: [facility];  Service: Endoscopy;  Laterality: N/A;  Venia Minks dilation N/A 08/11/2013    Procedure: Venia Minks DILATION;  Surgeon: Daneil Dolin, MD;  Location: [facility];  Service: Endoscopy;  Laterality: N/A;  . Percutaneous coronary stent intervention (pci-s) N/A 07/17/2011    Procedure: PERCUTANEOUS CORONARY STENT INTERVENTION (PCI-S);  Surgeon: Burnell Blanks, MD;  Location: [facility] CATH LAB;  Service: Cardiovascular;  Laterality: N/A;    FAMILY HISTORY: Family History  Problem Relation Age of Onset  . Coronary artery disease Father   . Coronary artery disease Brother     CABG  . Diabetes Brother   . Coronary artery disease Brother   . Heart Problems Mother   Mother is deceased in her 65 from "old age." Father is deceased at 33 yo from "old age." He has 7 siblings in total, only 1 sister living.   He has 2 sons: 68yrs old with HTN and gouth   34 yo with stroke.  SOCIAL HISTORY: Not likely accurate, but, patient quit smoking 50 years ago after smoking 1/4 ppd x 10 years (son seems confused by his father's response), he denies any EtOH abuse, and illicit drug abuse.  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  He is a widower but married x 63 years.  PERFORMANCE STATUS: The patient's performance status is 3 - Symptomatic, >50% confined to bed  PHYSICAL EXAM: Most Recent Vital Signs: Blood pressure 145/80, pulse 92, temperature 97.7 F (36.5 C), temperature source Oral, resp. rate 22, height 5\' 10"  (1.778 m), weight 225 lb (102.059 kg), SpO2 94 %. General appearance: alert, cooperative, appears stated age, no distress, moderately obese and pleasantly demented/confused, accompanied by son and grandson Head: Normocephalic, without obvious abnormality, atraumatic Eyes: negative findings: lids and lashes normal, conjunctivae and sclerae normal and corneas  clear Throat: normal findings: lips normal without lesions, buccal mucosa normal and tongue midline and normal Neck: no adenopathy and supple, symmetrical, trachea midline Lungs: diminished breath sounds bilaterally Heart: regular rate and rhythm, S1, S2 normal and  systolic murmur: systolic ejection 2/6, blowing at 2nd right intercostal space Extremities: Homans sign is negative, no sign of DVT Skin: Skin color, texture, turgor normal. No rashes or lesions Lymph nodes: Cervical, supraclavicular, and axillary nodes normal. Neurologic: Mental status: alertness: alert, orientation: time, person, affect: normal, pleasantly confused and unable to completely follow conversation or understand difficult material.  LABORATORY DATA:  CBC    Component Value Date/Time   WBC 11.9* 05/28/2015 1329   RBC 2.88* 05/28/2015 1329   RBC 2.88* 05/28/2015 1329   HGB 10.0* 05/28/2015 1329   HGB 11.5 11/04/2012   HCT 29.9* 05/28/2015 1329   HCT 35 11/04/2012   PLT 210 05/28/2015 1329   MCV 103.8* 05/28/2015 1329   MCV 98.3 11/04/2012   MCH 34.7* 05/28/2015 1329   MCHC 33.4 05/28/2015 1329   RDW 15.1 05/28/2015 1329   LYMPHSABS 1.4 05/28/2015 1329   MONOABS 1.3* 05/28/2015 1329   EOSABS 0.2 05/28/2015 1329   BASOSABS 0.0 05/28/2015 1329      Chemistry      Component Value Date/Time   NA 135 05/28/2015 1329   K 4.2 05/28/2015 1329   K 4.5 11/08/2012   CL 97* 05/28/2015 1329   CO2 29 05/28/2015 1329   BUN 24* 05/28/2015 1329   BUN 23* 11/08/2012   CREATININE 1.23 05/28/2015 1329   CREATININE 1.4* 11/08/2012   CREATININE 0.85 07/07/2011 1419      Component Value Date/Time   CALCIUM 8.6* 05/28/2015 1329   ALKPHOS 75 05/28/2015 1329   ALKPHOS 83 11/04/2012   AST 28 05/28/2015 1329   AST 19 11/04/2012   ALT 29 05/28/2015 1329   BILITOT 0.5 05/28/2015 1329   BILITOT 0.5 11/04/2012       RADIOGRAPHY:  CLINICAL DATA: Abdominal pain and distention.  EXAM: CT ABDOMEN AND PELVIS WITH  CONTRAST  TECHNIQUE: Multidetector CT imaging of the abdomen and pelvis was performed using the standard protocol following bolus administration of intravenous contrast.  CONTRAST: 116mL OMNIPAQUE IOHEXOL 300 MG/ML SOLN  COMPARISON: CT scans dated 06/27/2009 and 02/09/2009  FINDINGS: Lower chest: Extensive aortic atherosclerosis and coronary artery calcification. Slight atelectasis at the left lung base.  Hepatobiliary: Numerous small benign hepatic cyst. Focal new area of fatty infiltration of the posterior superior aspect of the left lobe of the liver. New 6 mm stone in the gallbladder. Bile ducts are normal.  Pancreas: The patient has developed a 3.1 x 1.7 x 2.4 cm low-density mass in the pancreatic head consistent with primary carcinoma of the pancreas. No bile duct dilatation. No pancreatic duct dilatation.  Spleen: Normal.  Adrenals/Urinary Tract: Adrenal glands are normal. 14 mm cyst on the mid right kidney. Small bilateral renal calculi. Slight malrotation of the right kidney. Normal ureters and bladder.  Stomach/Bowel: Previous right hemicolectomy. Diverticulosis of the sigmoid portion of the colon.  Vascular/Lymphatic: Extensive aortic and iliac and femoral atherosclerosis. No adenopathy.  Reproductive: Normal.  Other: No free air or free fluid.  Musculoskeletal: No acute abnormality. 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  Chronic spondylolisthesis at L5-S1, unchanged. Chronic sclerosis of the T10 vertebral body.  IMPRESSION: 1. New mass in the pancreatic head consistent with primary carcinoma of the pancreas. No discrete metastases. 2. New small stone in the gallbladder. 3. New focal fatty infiltration in the left lobe of the liver.   Electronically Signed  By: Lorriane Shire M.D.  On: 05/18/2015 11:41  PATHOLOGY:   N/A  ASSESSMENT/PLAN:   Pancreatic mass Pancreatic mass, measuring 3.1 cm in head of pancreas concerning for pancreatic cancer.  No clear evidence of metastatic disease within the abd/pelvis without chest imaging at this time.  Multiple co-morbidities including cardiac history extensive CAD, extensive PVD/PAD, chronic respiratory failure, diastolic CHF, ischemic cardiomyopathy, and dementia.  Patient is not a surgical candidate and not likely a systemic chemotherapy candidate (given his performance status and other co-morbidities).  We broached the topic of palliative systemic chemotherapy, but not pursuing palliative treatment would be reasonable given his current status.    We broached the topic of code status.  No changes at this time regarding his FULL CODE status.  He is interested to develop a living will and we will facilitate this at his upcoming follow-up appointment with our notary and clinical social worker.  We will further discuss goals-of-care in the future. I provided his son with a copy of living will paperwork.  He is hoping that his brother can be at his father's next follow-up visit.   Of note, his HCPOA (son) had a stroke yesterday and is not present at today's appointment.  Labs today: CBC diff, CMET, CA 19-9.  Will refer to Dr. Eugenia Pancoast for consideration of EUS for biopsy of pancreatic mass. I do not feel that establishing a diagnosis for the patient and family is unreasonable.  Point of contact for the patient is Lamount Cohen at [facility] for scheduling of this procedure.  Given patient's complaint of abdominal pain, I would ask his attending physician/APP to consider discontinuing or holding his ferrous sulfate as this may be contributing to his abdominal pain.  Patient will return in ~ 3 weeks for follow-up.  Anemia No recent labs available to me that would be beneficial at this time.  Macrocytic anemia dates back to at least 2014 with anemia dating  back to at least 2013.  Labs today: CBC diff, CMET, anemia panel, retic count, pathology smear review.   Leukocytosis Chronic leukocytosis dates back to at least 2014.  Labs today: CBC diff, CMET.  All questions were answered. The patient knows to call the clinic with any problems, questions or concerns. We can certainly see the patient much sooner if necessary.  This note is electronically signed by  Molli Hazard, MD  05/28/2015 5:08 PM

## 2015-05-28 NOTE — Assessment & Plan Note (Signed)
Chronic leukocytosis dates back to at least 2014.  Labs today: CBC diff, CMET.

## 2015-05-28 NOTE — Assessment & Plan Note (Addendum)
Pancreatic mass, measuring 3.1 cm in head of pancreas concerning for pancreatic cancer.  No clear evidence of metastatic disease within the abd/pelvis without chest imaging at this time.  Multiple co-morbidities including cardiac history extensive CAD, extensive PVD/PAD, chronic respiratory failure, diastolic CHF, ischemic cardiomyopathy, and dementia.  Patient is not a surgical candidate and not likely a systemic chemotherapy candidate (given his performance status and other co-morbidities).  We broached the topic of palliative systemic chemotherapy, but not pursuing palliative treatment would be reasonable given his current status.    We broached the topic of code status.  No changes at this time regarding his FULL CODE status.  He is interested to develop a living will and we will facilitate this at his upcoming follow-up appointment with our notary and clinical social worker.  We will further discuss goals-of-care in the future.  Of note, his HCPOA (son) had a stroke yesterday and is not present at today's appointment.  Labs today: CBC diff, CMET, CA 19-9.  Will refer to Dr. Eugenia Pancoast for consideration of EUS for biopsy of pancreatic mass.  Point of contact for the patient is Lamount Cohen at [facility] for scheduling of this procedure.  Given patient's complaint of abdominal pain, I would ask his attending physician/APP to consider discontinuing or holding his ferrous sulfate as this may be contributing to his abdominal pain.  Patient will return in ~ 3 weeks for follow-up.

## 2015-05-29 ENCOUNTER — Telehealth: Payer: Self-pay

## 2015-05-29 LAB — PATHOLOGIST SMEAR REVIEW

## 2015-05-29 LAB — CANCER ANTIGEN 19-9: CA 19 9: 46 U/mL — AB (ref 0–35)

## 2015-05-29 NOTE — Telephone Encounter (Signed)
-----   Message from Milus Banister, MD sent at 05/29/2015  7:08 AM EDT ----- Gershon Mussel, Thanks. We'll get this scheduled.  I am out of town next week so it will probably be Thursday April 20th.  Woodroe Chen, He needs upper EUS, radial +/- linear, Thursday April 20th with mac for pancreatic mass.  Thanks  DJ  ----- Message -----    From: Baird Cancer, PA-C    Sent: 05/28/2015   1:08 PM      To: Milus Banister, MD  Good afternoon,   New patient with pancreatic mass.  He is not a surgical candidate and not a chemotherapy candidate (highly unlikely).  Patient is interested getting a diagnosis. Many co-morbidities.  Do you think we can get an EUS for diagnosis?  Thanks in advance.  Kirby Crigler, PA-C [facility]

## 2015-05-31 ENCOUNTER — Other Ambulatory Visit: Payer: Self-pay

## 2015-05-31 DIAGNOSIS — K8689 Other specified diseases of pancreas: Secondary | ICD-10-CM

## 2015-05-31 NOTE — Telephone Encounter (Signed)
EUS has been scheduled for 06/14/15 at 930 am, Dr Ardis Hughs the pt is on plavix, should he hold?

## 2015-05-31 NOTE — Telephone Encounter (Signed)
RE: Howard [name] DOB: January 31, 1930 MRN: [id number]   Dear Dr Lovena Le,    We have scheduled the above patient for an endoscopic procedure. Our records show that he is on anticoagulation therapy.   Please advise as to how long the patient may come off his therapy of Plavix prior to the procedure, which is scheduled for 06/14/15.  Please fax back/ or route response to Colisha Redler at [phone].   Sincerely,    Christian Mate RN

## 2015-05-31 NOTE — Telephone Encounter (Signed)
Yes, he will need to hold it for 5 days prior.  Please ask that his PCp or cardiologist OK that.  Thanks

## 2015-06-01 NOTE — Telephone Encounter (Signed)
I spoke to Howard [name] at the rehab facility where the pt is currently a resident.  She asked for the instructions to be faxed to [phone] to her attn.  I asked that she call back to confirm receipt.  Waiting for anticoag response.

## 2015-06-04 NOTE — Telephone Encounter (Signed)
May hold plavix for 5 days prior to the procedure and restart plavix when safe from your perspective. GT  Recommendation for plavix hold faxed to [phone] and called and spoke to Claudean Kinds and gave a verbal order.

## 2015-06-05 ENCOUNTER — Encounter: Payer: Self-pay | Admitting: *Deleted

## 2015-06-05 NOTE — Progress Notes (Signed)
[place] Clinical Social Work  Clinical Social Work was referred by PA for assistance with ADRS/HCPOA paperwork completion. Clinical Social Worker attempted to contact patient  to offer support and assess for needs. Pt currently resides at [facility] and is "confused" per nursing at that facility. They state son, Audry Pili, is POA. CSW further reviewed chart and found documentation from [facility] that lists both son's as emergency contacts as listed on pt's facesheet. Pt would not be able to sign POA paperwork currently due to dementia and confused mental status. Family will have to make decisions on his behalf regarding his care. Son's are currently next of kin and are legal decision makers for pt.     Loren Racer, [place] [employer]   [phone]

## 2015-06-07 ENCOUNTER — Encounter (HOSPITAL_COMMUNITY): Payer: Self-pay | Admitting: *Deleted

## 2015-06-07 NOTE — Progress Notes (Signed)
Preop instructions for:  Howard [name]                       Date of Birth : 04/17/1929                           Date of Procedure:  06-14-2015     Doctor: Howard [name] Time to arrive at [facility]: 0800 AM Report to: Admitting Procedure time:  0930-1030 AM          Procedure:Upper Endoscopy Ultrasound Any procedure time changes, MD office will notify you!   Do not eat or drink past midnight the night before your procedure.(To include any tube feedings-must be discontinued) Reminder:Follow bowel prep instructions per MD office!   Take these morning medications only with sips of water.(or give through gastrostomy or feeding tube). Cymbalta.Carvedilol. Meclizine. Eye drops-usual. Use Inhalers/Nebulizer-usual. Lantus(1/2 usual PM dose) night before. No Insulin or diabetic meds AM of procedure. Note: No Insulin or Diabetic meds should be given or taken the morning of the procedure!   Facility contact:  Howard [name]                Phone:  [facility] [id number]" Howard [name],Howard [name] [phone]  Transportation contact phone#: Howard [name] / [phone]  Please send day of procedure:current med list and time meds last taken that day, confirm nothing by mouth status from what time, Patient Demographic info( to include DNR status, problem list, allergies)   RN contact name/phone#:   Howard [name] [phone]                          and Fax #: [phone]  Bring Insurance card and picture ID Leave all jewelry and other valuables at place where living( no metal or rings to be worn) No contact lens Women-no make-up, no lotions,perfumes,powders Men-no colognes,lotions  Any questions day of procedure,call Endoscopy unit-[phone]   Sent from :[facility] Presurgical Testing                   [place]                   [phone]  Sent by :Howard [name] Department [facility]

## 2015-06-07 NOTE — Progress Notes (Addendum)
06-07-15 1215 PM Pre procedure instructions faxed to [facility] Nursing/Rehab attention Matthew Saras, RN- asked to acknowledge receipt and understanding. Spoke with patients son - Jeneen Rinks "Audry Pili" Danza, responsible party - "will plan to be here".

## 2015-06-14 ENCOUNTER — Ambulatory Visit (HOSPITAL_COMMUNITY): Payer: Medicare Other | Admitting: Anesthesiology

## 2015-06-14 ENCOUNTER — Ambulatory Visit (HOSPITAL_COMMUNITY)
Admission: RE | Admit: 2015-06-14 | Discharge: 2015-06-14 | Disposition: A | Discharge status: Skilled Nursing Facility | Payer: Medicare Other | Source: Ambulatory Visit | Attending: Gastroenterology | Admitting: Gastroenterology

## 2015-06-14 ENCOUNTER — Encounter (HOSPITAL_COMMUNITY): Payer: Self-pay | Admitting: Anesthesiology

## 2015-06-14 ENCOUNTER — Encounter (HOSPITAL_COMMUNITY)
Admission: RE | Disposition: A | Discharge status: Skilled Nursing Facility | Payer: Self-pay | Source: Ambulatory Visit | Attending: Gastroenterology

## 2015-06-14 DIAGNOSIS — I5030 Unspecified diastolic (congestive) heart failure: Secondary | ICD-10-CM | POA: Diagnosis not present

## 2015-06-14 DIAGNOSIS — Z794 Long term (current) use of insulin: Secondary | ICD-10-CM | POA: Insufficient documentation

## 2015-06-14 DIAGNOSIS — E785 Hyperlipidemia, unspecified: Secondary | ICD-10-CM | POA: Insufficient documentation

## 2015-06-14 DIAGNOSIS — K862 Cyst of pancreas: Secondary | ICD-10-CM

## 2015-06-14 DIAGNOSIS — Z6832 Body mass index (BMI) 32.0-32.9, adult: Secondary | ICD-10-CM | POA: Diagnosis not present

## 2015-06-14 DIAGNOSIS — Z87891 Personal history of nicotine dependence: Secondary | ICD-10-CM | POA: Diagnosis not present

## 2015-06-14 DIAGNOSIS — I2510 Atherosclerotic heart disease of native coronary artery without angina pectoris: Secondary | ICD-10-CM | POA: Diagnosis not present

## 2015-06-14 DIAGNOSIS — F419 Anxiety disorder, unspecified: Secondary | ICD-10-CM | POA: Diagnosis not present

## 2015-06-14 DIAGNOSIS — E1151 Type 2 diabetes mellitus with diabetic peripheral angiopathy without gangrene: Secondary | ICD-10-CM | POA: Insufficient documentation

## 2015-06-14 DIAGNOSIS — Z79899 Other long term (current) drug therapy: Secondary | ICD-10-CM | POA: Diagnosis not present

## 2015-06-14 DIAGNOSIS — Z7951 Long term (current) use of inhaled steroids: Secondary | ICD-10-CM | POA: Diagnosis not present

## 2015-06-14 DIAGNOSIS — M170 Bilateral primary osteoarthritis of knee: Secondary | ICD-10-CM | POA: Insufficient documentation

## 2015-06-14 DIAGNOSIS — K8689 Other specified diseases of pancreas: Secondary | ICD-10-CM | POA: Diagnosis present

## 2015-06-14 DIAGNOSIS — K219 Gastro-esophageal reflux disease without esophagitis: Secondary | ICD-10-CM | POA: Insufficient documentation

## 2015-06-14 DIAGNOSIS — R933 Abnormal findings on diagnostic imaging of other parts of digestive tract: Secondary | ICD-10-CM | POA: Diagnosis not present

## 2015-06-14 DIAGNOSIS — I340 Nonrheumatic mitral (valve) insufficiency: Secondary | ICD-10-CM | POA: Diagnosis not present

## 2015-06-14 DIAGNOSIS — I110 Hypertensive heart disease with heart failure: Secondary | ICD-10-CM | POA: Insufficient documentation

## 2015-06-14 DIAGNOSIS — I255 Ischemic cardiomyopathy: Secondary | ICD-10-CM | POA: Diagnosis not present

## 2015-06-14 DIAGNOSIS — Z7902 Long term (current) use of antithrombotics/antiplatelets: Secondary | ICD-10-CM | POA: Insufficient documentation

## 2015-06-14 DIAGNOSIS — D649 Anemia, unspecified: Secondary | ICD-10-CM | POA: Diagnosis not present

## 2015-06-14 DIAGNOSIS — J449 Chronic obstructive pulmonary disease, unspecified: Secondary | ICD-10-CM | POA: Insufficient documentation

## 2015-06-14 DIAGNOSIS — K449 Diaphragmatic hernia without obstruction or gangrene: Secondary | ICD-10-CM | POA: Diagnosis not present

## 2015-06-14 DIAGNOSIS — F0390 Unspecified dementia without behavioral disturbance: Secondary | ICD-10-CM | POA: Insufficient documentation

## 2015-06-14 DIAGNOSIS — Z9981 Dependence on supplemental oxygen: Secondary | ICD-10-CM | POA: Insufficient documentation

## 2015-06-14 DIAGNOSIS — E669 Obesity, unspecified: Secondary | ICD-10-CM | POA: Insufficient documentation

## 2015-06-14 DIAGNOSIS — D72829 Elevated white blood cell count, unspecified: Secondary | ICD-10-CM | POA: Insufficient documentation

## 2015-06-14 HISTORY — DX: Dysphagia, unspecified: R13.10

## 2015-06-14 HISTORY — DX: Other reduced mobility: Z74.09

## 2015-06-14 HISTORY — DX: Dependence on supplemental oxygen: Z99.81

## 2015-06-14 HISTORY — DX: Hypothyroidism, unspecified: E03.9

## 2015-06-14 HISTORY — PX: EUS: SHX5427

## 2015-06-14 HISTORY — DX: Anemia, unspecified: D64.9

## 2015-06-14 LAB — PANC CYST FLD ANLYS-PATHFNDR-TG

## 2015-06-14 LAB — GLUCOSE, CAPILLARY: GLUCOSE-CAPILLARY: 158 mg/dL — AB (ref 65–99)

## 2015-06-14 SURGERY — UPPER ENDOSCOPIC ULTRASOUND (EUS) LINEAR
Anesthesia: Monitor Anesthesia Care

## 2015-06-14 MED ORDER — PROPOFOL 10 MG/ML IV BOLUS
INTRAVENOUS | Status: AC
  Filled 2015-06-14: qty 40

## 2015-06-14 MED ORDER — LIDOCAINE HCL (CARDIAC) 20 MG/ML IV SOLN
INTRAVENOUS | Status: AC
  Filled 2015-06-14: qty 5

## 2015-06-14 MED ORDER — LACTATED RINGERS IV SOLN
INTRAVENOUS | Status: DC
  Administered 2015-06-14: 1000 mL via INTRAVENOUS

## 2015-06-14 MED ORDER — CIPROFLOXACIN IN D5W 400 MG/200ML IV SOLN
INTRAVENOUS | Status: DC | PRN
  Administered 2015-06-14: 400 mg via INTRAVENOUS

## 2015-06-14 MED ORDER — GLYCOPYRROLATE 0.2 MG/ML IJ SOLN
INTRAMUSCULAR | Status: DC | PRN
  Administered 2015-06-14: 0.2 mg via INTRAVENOUS

## 2015-06-14 MED ORDER — SODIUM CHLORIDE 0.9 % IV SOLN: INTRAVENOUS | Status: DC

## 2015-06-14 MED ORDER — CIPROFLOXACIN HCL 500 MG PO TABS: 500.0000 mg | ORAL_TABLET | Freq: Two times a day (BID) | ORAL | Status: DC

## 2015-06-14 MED ORDER — LIDOCAINE HCL (CARDIAC) 20 MG/ML IV SOLN
INTRAVENOUS | Status: DC | PRN
  Administered 2015-06-14: 50 mg via INTRAVENOUS

## 2015-06-14 MED ORDER — PROPOFOL 500 MG/50ML IV EMUL
INTRAVENOUS | Status: DC | PRN
  Administered 2015-06-14: 150 ug/kg/min via INTRAVENOUS

## 2015-06-14 MED ORDER — CIPROFLOXACIN IN D5W 400 MG/200ML IV SOLN
INTRAVENOUS | Status: AC
  Filled 2015-06-14: qty 200

## 2015-06-14 MED ORDER — GLYCOPYRROLATE 0.2 MG/ML IJ SOLN
INTRAMUSCULAR | Status: AC
  Filled 2015-06-14: qty 1

## 2015-06-14 NOTE — Op Note (Signed)
[facility] Patient Name: Howard [name] Procedure Date: 06/14/2015 MRN: [id number] Attending MD: Milus Banister , MD Date of Birth: [birth year] CSN:  Age: 82 Admit Type: Outpatient Procedure:                Upper EUS Indications:              Suspected mass in pancreas on CT scan Providers:                Milus Banister, MD, Cleda Daub, RN, Corliss Parish, Technician Referring MD:             Stevan Born, MD Medicines:                Monitored Anesthesia Care Complications:            No immediate complications. Estimated blood loss:                            None. Estimated Blood Loss:     Estimated blood loss: none. Procedure:                Pre-Anesthesia Assessment:                           - Prior to the procedure, a History and Physical                            was performed, and patient medications and                            allergies were reviewed. The patient's tolerance of                            previous anesthesia was also reviewed. The risks                            and benefits of the procedure and the sedation                            options and risks were discussed with the patient.                            All questions were answered, and informed consent                            was obtained. Prior Anticoagulants: The patient has                            taken Plavix (clopidogrel). ASA Grade Assessment:                            III - A patient with severe systemic disease. After  reviewing the risks and benefits, the patient was                            deemed in satisfactory condition to undergo the                            procedure.                           After obtaining informed consent, the endoscope was                            passed under direct vision. Throughout the                            procedure, the patient's blood pressure, pulse, and                            oxygen saturations were monitored continuously. The                            [id number]) scope was introduced through                            the mouth, and advanced to the second part of                            duodenum. The [id number]) scope was                            introduced through the mouth, and advanced to the                            second part of duodenum. The upper EUS was                            accomplished without difficulty. The patient                            tolerated the procedure well. Scope In: Scope Out: Findings:      Endoscopic Finding :      The examined esophagus was endoscopically normal.      The entire examined stomach was endoscopically normal.      The examined duodenum was endoscopically normal.      Endosonographic Finding :      1. An anechoic lesion suggestive of a cyst was identified in the       pancreatic head. It is not in obvious communication with the main       pancreatic duct. The lesion measured 25 mm in maximal cross-sectional       diameter. There were a few compartments thinly septated. The outer wall       of the lesion was thin. There was no associated mass. There was no       internal debris within the fluid-filled cavity. Diagnostic needle       aspiration  for fluid was performed. Color Doppler imaging was utilized       prior to needle puncture to confirm a lack of significant vascular       structures within the needle path. One pass was made with the 22 gauge       needle using a transduodenal approach. No stylet was used. The amount of       fluid collected was 5 mL. The fluid was clear and thin. Sample(s) were       sent for amylase concentration, cytology and CEA.      2. No peripancreatic adenopathy      3. Pancreatic parenchyma was atrophic and somewhat fatty replaced but       otherwise was normal.      4. Main pancreatic duct was normal; non-dilated.      5. CBD  was normal, non-dilated. Impression:               - Normal esophagus.                           - Normal stomach.                           - Normal examined duodenum.                           - A cystic lesion was seen in the pancreatic head.                            Fine needle aspiration for fluid performed, sent                            for CEA, amylase and cytology. The cyst is not                            malignant or pre-malignant appearing. Moderate Sedation:      N/A- Per Anesthesia Care Recommendation:           - Discharge patient to a nursing home.                           - Cipro (ciprofloxacin) 500 mg PO BID for 3 days. Procedure Code(s):        --- Professional ---                           [phone], Esophagogastroduodenoscopy, flexible,                            transoral; with transendoscopic ultrasound-guided                            intramural or transmural fine needle                            aspiration/biopsy(s), (includes endoscopic                            ultrasound examination limited to the esophagus,  stomach or duodenum, and adjacent structures) Diagnosis Code(s):        --- Professional ---                           K86.2, Cyst of pancreas                           R93.3, Abnormal findings on diagnostic imaging of                            other parts of digestive tract CPT copyright 2016 American [employer]. All rights reserved. The codes documented in this report are preliminary and upon coder review may  be revised to meet current compliance requirements. Milus Banister, MD 06/14/2015 9:53:46 AM This report has been signed electronically. Number of Addenda: 0

## 2015-06-14 NOTE — Interval H&P Note (Signed)
History and Physical Interval Note:  06/14/2015 8:45 AM  Howard [name]  has presented today for surgery, with the diagnosis of pancreatic mass  The various methods of treatment have been discussed with the patient and family. After consideration of risks, benefits and other options for treatment, the patient has consented to  Procedure(s): UPPER ENDOSCOPIC ULTRASOUND (EUS) LINEAR (N/A) as a surgical intervention .  The patient's history has been reviewed, patient examined, no change in status, stable for surgery.  I have reviewed the patient's chart and labs.  Questions were answered to the patient's satisfaction.     Milus Banister

## 2015-06-14 NOTE — H&P (View-Only) (Signed)
[facility] Hematology/Oncology Consultation   Name: Howard [name]      MRN: [id number]   Date: 05/28/2015 Time:5:08 PM   REFERRING PHYSICIAN:  Victorino December, NP  REASON FOR CONSULT:  Pancreatic mass   DIAGNOSIS:  3.1 x 1.7 x 2.4 cm (low-density) mass in the pancreatic head, concerning for primary pancreatic carcinoma.  HISTORY OF PRESENT ILLNESS:   [name] is a 82 year old white American man who is a resident at [employer] [employer] with a past medical history significant for CAD (extensive), PVD/PAD (extensive), chronic spondylolisthesis at L5-S1, chronic respiratory failure, diastolic CHF, GERD, hyperlipidemia, ischemic cardiomyopathy, type II DM who is referred to [facility] urgently for newly discovered pancreatic mass. He also has an extensive GI history which includes a right hemicolectomy, pancolonic diverticulosis, tubular adenoma, hyperplastic polyp, and microscopic colitis. He also has a history of pseudomembranous colitis.   I personally reviewed and went over laboratory results with the patient.  The results are noted within this dictation.  No recent labs available to me that would be beneficial at this time.  Chronic leukocytosis dates back to at least 2014, macrocytic anemia dates back to at least 2014 with anemia dating back to at least 2013.  I personally reviewed and went over radiographic studies with the patient.  The results are noted within this dictation.  CT abd/pelvis w contrast performed on 05/18/2015 for abdominal pain demonstrated a 3.1 x 1.7 x 2.4 cm (low-density) mass in the pancreatic head, concerning for primary pancreatic carcinoma.  History difficult to ascertain secondary to patient's dementia and family members who accompany the patient are not clear on his medical history either.   (e: Me to the patient: "Do you have your gallbladder?"  Patient:"No, that's gone! Isn't it [looking at his son]?"  Son's response  is "I guess so.  Mine is gone.  If mine is gone, yours must be.")  He reports that his appetite is stable.  He notes that his weight fluctuates regularly secondary to his heart disease.  He admits to a decreased energy level x 6 months.  He reports light colored urine.  [name] who is his HCPOA has recently had a CVA and is unavailable at our visit today.   Review of Systems  Constitutional: Positive for malaise/fatigue. Negative for fever, chills and weight loss.  HENT: Negative.   Eyes: Negative.   Respiratory: Positive for shortness of breath.   Cardiovascular: Negative.   Gastrointestinal: Negative.   Genitourinary: Negative.   Musculoskeletal: Negative.   Skin: Negative.   Neurological: Negative.   Endo/Heme/Allergies: Negative.   Psychiatric/Behavioral: Negative.    The patient is educated regarding his imaging tests and the meaning of the findings.  His CT scan is concerning for pancreatic cancer.  We discussed this and what it may entail for the patient.  The patient seems to be interested in diagnosis confirmation.  The patient's family concurs.  The questionable aspect of this patient's case is his ability to be treated palliatively with chemotherapy (gemcitabine single agent versus gemcitabine/Abraxane).  Not pursuing treatment would certainly be reasonable given his performance status and co-morbidities.  PAST MEDICAL HISTORY:   Past Medical History  Diagnosis Date  . Hypertension   . Hyperlipidemia   . Adenocarcinoma ([place]) 2003    COLON  . PVD (peripheral vascular disease) ([place])     ABI 0.78 RIGHT AND 0.80 LEFT 4/11  . Osteoarthritis  OF KNEES  . Kidney stone   . Schatzki's ring 08/05/10    egd with Dr. Gala Romney  . Hiatal hernia 08/05/10    egd with Dr. Gala Romney  . Esophageal dysmotility   . Microscopic colitis   . Internal hemorrhoids   . Diverticula of colon   . CHF (congestive heart failure) ([place])   . Coronary artery stenosis     a. 06/2011 Cath: LM 20, LAD  min irregs, D1 95 - small,  LCX 60p, RCA 49m, 50d, PLA 50ost;  b. 06/2011 PCI/Rota  RCA  -> 3.0x34mm Promus Element DES  . Angina   . COPD (chronic obstructive pulmonary disease) ([place])   . Asthma   . Pneumonia ~ 2005; 09/2005  . Shortness of breath 07/17/11    "laying down"  . Type II diabetes mellitus ([place])   . GERD (gastroesophageal reflux disease)   . Anxiety   . Ischemic cardiomyopathy     a. 05/2011 Echo: EF 45-50%, inf/post HK  . H. pylori infection 03/01/12    treated with prevpac  . Tubular adenoma   . Hyperplastic polyp of intestine   . Pancreatic mass 05/28/2015    ALLERGIES: No Known Allergies    MEDICATIONS: I have reviewed the patient's current medications.    Current Outpatient Prescriptions on File Prior to Visit  Medication Sig Dispense Refill  . budesonide-formoterol (SYMBICORT) 160-4.5 MCG/ACT inhaler Inhale 2 puffs into the lungs 2 (two) times daily.    . carvedilol (COREG) 3.125 MG tablet Take 3.125 mg by mouth 2 (two) times daily with a meal.     . Cinnamon 500 MG TABS Take 500 mg by mouth daily.    . clopidogrel (PLAVIX) 75 MG tablet Take 1 tablet (75 mg total) by mouth daily. 90 tablet 3  . DULoxetine (CYMBALTA) 30 MG capsule Take 30 mg by mouth daily.    . ferrous sulfate 325 (65 FE) MG EC tablet Take 325 mg by mouth 2 (two) times daily with a meal.    . insulin aspart (NOVOLOG) 100 UNIT/ML injection Inject 5 Units into the skin 3 (three) times daily before meals.    . insulin glargine (LANTUS) 100 UNIT/ML injection Inject 20 Units into the skin at bedtime.     . lansoprazole (PREVACID) 30 MG capsule Take 1 capsule (30 mg total) by mouth daily before breakfast. 30 capsule 11  . lisinopril (PRINIVIL,ZESTRIL) 2.5 MG tablet Take 2.5 mg by mouth daily.    Marland Kitchen loperamide (IMODIUM A-D) 2 MG tablet Take 2 mg by mouth daily as needed for diarrhea or loose stools.     Marland Kitchen LORazepam (ATIVAN) 0.5 MG tablet Take 0.5 mg by mouth every 4 (four) hours as needed.     . meclizine  (ANTIVERT) 12.5 MG tablet Take 12.5 mg by mouth 3 (three) times daily.    . polyethylene glycol (MIRALAX / GLYCOLAX) packet Take 17 g by mouth daily.    . potassium chloride SA (K-DUR,KLOR-CON) 20 MEQ tablet Take 20 mEq by mouth daily.    Marland Kitchen Propylene Glycol (SYSTANE BALANCE) 0.6 % SOLN Apply 1 drop to eye 2 (two) times daily.    . simvastatin (ZOCOR) 40 MG tablet Take 40 mg by mouth every evening.    . temazepam (RESTORIL) 15 MG capsule Take 15 mg by mouth at bedtime as needed for sleep.    Marland Kitchen tiotropium (SPIRIVA) 18 MCG inhalation capsule Place 18 mcg into inhaler and inhale daily.    Marland Kitchen torsemide (DEMADEX) 20 MG  tablet Take 2 tablets (40 mg total) by mouth daily. 60 tablet 6  . traMADol (ULTRAM) 50 MG tablet Take 1 tablet (50 mg total) by mouth every 6 (six) hours as needed. 30 tablet 0   No current facility-administered medications on file prior to visit.     PAST SURGICAL HISTORY Past Surgical History  Procedure Laterality Date  . Hemicolectomy  2003  . Appendectomy    . Colonoscopy  04/2001    Dr. Laural Golden- colon carcinoma  . Esophagogastroduodenoscopy  04/2001    Dr. Laural Golden  . Esophagogastroduodenoscopy  08/05/10    Dr. Evalee Mutton ring, hiatal hernia  . Colonoscopy  09/04/09    Dr. Matthew Folks, hemorrhoids  . Atherectomy  07/17/11  . Coronary angioplasty  07/17/11    rotablator  . Colon surgery    . Cataract extraction, bilateral    . Esophagogastroduodenoscopy  03/01/2012    Dr. Gala Romney- gastric erosions, small hiatal hernia, +hpylori,=treated with prevpac  . Colonoscopy  03/11/2012    Dr. Sabino Gasser R hemicolectomy, pancolonic diverticulosis, tubular adenoma, hyperplastic polyp  . Esophagogastroduodenoscopy N/A 08/11/2013    [id number] Schatzki's ring- s/p dilation as described above. Some retained gastric contents-query delay in gastric emptying. Abnormal gastric mucosa- s/p gastric biopsy (chronic gastritis, intestional metaplasia, no definite h.pylori)  . Savory dilation N/A  08/11/2013    Procedure: SAVORY DILATION;  Surgeon: Daneil Dolin, MD;  [facility];  Service: Endoscopy;  Laterality: N/A;  Venia Minks dilation N/A 08/11/2013    Procedure: Venia Minks DILATION;  Surgeon: Daneil Dolin, MD;  Location: [facility];  Service: Endoscopy;  Laterality: N/A;  . Percutaneous coronary stent intervention (pci-s) N/A 07/17/2011    Procedure: PERCUTANEOUS CORONARY STENT INTERVENTION (PCI-S);  Surgeon: Burnell Blanks, MD;  Location: [facility] CATH LAB;  Service: Cardiovascular;  Laterality: N/A;    FAMILY HISTORY: Family History  Problem Relation Age of Onset  . Coronary artery disease Father   . Coronary artery disease Brother     CABG  . Diabetes Brother   . Coronary artery disease Brother   . Heart Problems Mother   Mother is deceased in her 65 from "old age." Father is deceased at 93 yo from "old age." He has 7 siblings in total, only 1 sister living.   He has 2 sons: 56yrs old with HTN and gouth   71 yo with stroke.  SOCIAL HISTORY: Not likely accurate, but, patient quit smoking 50 years ago after smoking 1/4 ppd x 10 years (son seems confused by his father's response), he denies any EtOH abuse, and illicit drug abuse.  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  He is a widower but married x 63 years.  PERFORMANCE STATUS: The patient's performance status is 3 - Symptomatic, >50% confined to bed  PHYSICAL EXAM: Most Recent Vital Signs: Blood pressure 145/80, pulse 92, temperature 97.7 F (36.5 C), temperature source Oral, resp. rate 22, height 5\' 10"  (1.778 m), weight 225 lb (102.059 kg), SpO2 94 %. General appearance: alert, cooperative, appears stated age, no distress, moderately obese and pleasantly demented/confused, accompanied by son and grandson Head: Normocephalic, without obvious abnormality, atraumatic Eyes: negative findings: lids and lashes normal, conjunctivae and sclerae normal and corneas  clear Throat: normal findings: lips normal without lesions, buccal mucosa normal and tongue midline and normal Neck: no adenopathy and supple, symmetrical, trachea midline Lungs: diminished breath sounds bilaterally Heart: regular rate and rhythm, S1, S2 normal and  systolic murmur: systolic ejection 2/6, blowing at 2nd right intercostal space Extremities: Homans sign is negative, no sign of DVT Skin: Skin color, texture, turgor normal. No rashes or lesions Lymph nodes: Cervical, supraclavicular, and axillary nodes normal. Neurologic: Mental status: alertness: alert, orientation: time, person, affect: normal, pleasantly confused and unable to completely follow conversation or understand difficult material.  LABORATORY DATA:  CBC    Component Value Date/Time   WBC 11.9* 05/28/2015 1329   RBC 2.88* 05/28/2015 1329   RBC 2.88* 05/28/2015 1329   HGB 10.0* 05/28/2015 1329   HGB 11.5 11/04/2012   HCT 29.9* 05/28/2015 1329   HCT 35 11/04/2012   PLT 210 05/28/2015 1329   MCV 103.8* 05/28/2015 1329   MCV 98.3 11/04/2012   MCH 34.7* 05/28/2015 1329   MCHC 33.4 05/28/2015 1329   RDW 15.1 05/28/2015 1329   LYMPHSABS 1.4 05/28/2015 1329   MONOABS 1.3* 05/28/2015 1329   EOSABS 0.2 05/28/2015 1329   BASOSABS 0.0 05/28/2015 1329      Chemistry      Component Value Date/Time   NA 135 05/28/2015 1329   K 4.2 05/28/2015 1329   K 4.5 11/08/2012   CL 97* 05/28/2015 1329   CO2 29 05/28/2015 1329   BUN 24* 05/28/2015 1329   BUN 23* 11/08/2012   CREATININE 1.23 05/28/2015 1329   CREATININE 1.4* 11/08/2012   CREATININE 0.85 07/07/2011 1419      Component Value Date/Time   CALCIUM 8.6* 05/28/2015 1329   ALKPHOS 75 05/28/2015 1329   ALKPHOS 83 11/04/2012   AST 28 05/28/2015 1329   AST 19 11/04/2012   ALT 29 05/28/2015 1329   BILITOT 0.5 05/28/2015 1329   BILITOT 0.5 11/04/2012       RADIOGRAPHY:  CLINICAL DATA: Abdominal pain and distention.  EXAM: CT ABDOMEN AND PELVIS WITH  CONTRAST  TECHNIQUE: Multidetector CT imaging of the abdomen and pelvis was performed using the standard protocol following bolus administration of intravenous contrast.  CONTRAST: 117mL OMNIPAQUE IOHEXOL 300 MG/ML SOLN  COMPARISON: CT scans dated 06/27/2009 and 02/09/2009  FINDINGS: Lower chest: Extensive aortic atherosclerosis and coronary artery calcification. Slight atelectasis at the left lung base.  Hepatobiliary: Numerous small benign hepatic cyst. Focal new area of fatty infiltration of the posterior superior aspect of the left lobe of the liver. New 6 mm stone in the gallbladder. Bile ducts are normal.  Pancreas: The patient has developed a 3.1 x 1.7 x 2.4 cm low-density mass in the pancreatic head consistent with primary carcinoma of the pancreas. No bile duct dilatation. No pancreatic duct dilatation.  Spleen: Normal.  Adrenals/Urinary Tract: Adrenal glands are normal. 14 mm cyst on the mid right kidney. Small bilateral renal calculi. Slight malrotation of the right kidney. Normal ureters and bladder.  Stomach/Bowel: Previous right hemicolectomy. Diverticulosis of the sigmoid portion of the colon.  Vascular/Lymphatic: Extensive aortic and iliac and femoral atherosclerosis. No adenopathy.  Reproductive: Normal.  Other: No free air or free fluid.  Musculoskeletal: No acute abnormality. 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  Chronic spondylolisthesis at L5-S1, unchanged. Chronic sclerosis of the T10 vertebral body.  IMPRESSION: 1. New mass in the pancreatic head consistent with primary carcinoma of the pancreas. No discrete metastases. 2. New small stone in the gallbladder. 3. New focal fatty infiltration in the left lobe of the liver.   Electronically Signed  By: Lorriane Shire M.D.  On: 05/18/2015 11:41  PATHOLOGY:   N/A  ASSESSMENT/PLAN:   Pancreatic mass Pancreatic mass, measuring 3.1 cm in head of pancreas concerning for pancreatic cancer.  No clear evidence of metastatic disease within the abd/pelvis without chest imaging at this time.  Multiple co-morbidities including cardiac history extensive CAD, extensive PVD/PAD, chronic respiratory failure, diastolic CHF, ischemic cardiomyopathy, and dementia.  Patient is not a surgical candidate and not likely a systemic chemotherapy candidate (given his performance status and other co-morbidities).  We broached the topic of palliative systemic chemotherapy, but not pursuing palliative treatment would be reasonable given his current status.    We broached the topic of code status.  No changes at this time regarding his FULL CODE status.  He is interested to develop a living will and we will facilitate this at his upcoming follow-up appointment with our notary and clinical social worker.  We will further discuss goals-of-care in the future. I provided his son with a copy of living will paperwork.  He is hoping that his brother can be at his father's next follow-up visit.   Of note, his HCPOA (son) had a stroke yesterday and is not present at today's appointment.  Labs today: CBC diff, CMET, CA 19-9.  Will refer to Dr. Eugenia Pancoast for consideration of EUS for biopsy of pancreatic mass. I do not feel that establishing a diagnosis for the patient and family is unreasonable.  Point of contact for the patient is Lamount Cohen at [facility] for scheduling of this procedure.  Given patient's complaint of abdominal pain, I would ask his attending physician/APP to consider discontinuing or holding his ferrous sulfate as this may be contributing to his abdominal pain.  Patient will return in ~ 3 weeks for follow-up.  Anemia No recent labs available to me that would be beneficial at this time.  Macrocytic anemia dates back to at least 2014 with anemia dating  back to at least 2013.  Labs today: CBC diff, CMET, anemia panel, retic count, pathology smear review.   Leukocytosis Chronic leukocytosis dates back to at least 2014.  Labs today: CBC diff, CMET.  All questions were answered. The patient knows to call the clinic with any problems, questions or concerns. We can certainly see the patient much sooner if necessary.  This note is electronically signed by  Molli Hazard, MD  05/28/2015 5:08 PM

## 2015-06-14 NOTE — Transfer of Care (Signed)
Immediate Anesthesia Transfer of Care Note  Patient: Howard [name]  Procedure(s) Performed: Procedure(s): UPPER ENDOSCOPIC ULTRASOUND (EUS) LINEAR (N/A)  Patient Location: PACU  Anesthesia Type:MAC  Level of Consciousness: awake, alert , oriented and patient cooperative  Airway & Oxygen Therapy: Patient Spontanous Breathing and Patient connected to nasal cannula oxygen  Post-op Assessment: Report given to RN, Post -op Vital signs reviewed and stable and Patient moving all extremities X 4  Post vital signs: stable  Last Vitals:  Filed Vitals:   06/14/15 0809 06/14/15 0952  BP: 153/64 123/45  Pulse: 72 88  Temp: 36.7 C   Resp:  16    Complications: No apparent anesthesia complications

## 2015-06-14 NOTE — Anesthesia Postprocedure Evaluation (Signed)
Anesthesia Post Note  Patient: Howard [name]  Procedure(s) Performed: Procedure(s) (LRB): UPPER ENDOSCOPIC ULTRASOUND (EUS) LINEAR (N/A)  Patient location during evaluation: PACU Anesthesia Type: MAC Level of consciousness: awake and alert Pain management: pain level controlled Vital Signs Assessment: post-procedure vital signs reviewed and stable Respiratory status: spontaneous breathing, nonlabored ventilation, respiratory function stable and patient connected to nasal cannula oxygen Cardiovascular status: stable and blood pressure returned to baseline Anesthetic complications: no    Last Vitals:  Filed Vitals:   06/14/15 1020 06/14/15 1030  BP: 131/46   Pulse: 81 81  Temp:    Resp: 13 18    Last Pain:  Filed Vitals:   06/14/15 1030  PainSc: 2                  Howard [name]

## 2015-06-14 NOTE — Anesthesia Preprocedure Evaluation (Addendum)
Anesthesia Evaluation  Patient identified by MRN, date of birth, ID band Patient awake    Reviewed: Allergy & Precautions, NPO status , Patient's Chart, lab work & pertinent test results  Airway Mallampati: II  TM Distance: >3 FB Neck ROM: Full    Dental no notable dental hx.    Pulmonary shortness of breath, asthma , pneumonia, COPD,  COPD inhaler and oxygen dependent, former smoker,  Oxygen at home always.States breathing is at baseline today.   Pulmonary exam normal breath sounds clear to auscultation       Cardiovascular hypertension, Pt. on medications and Pt. on home beta blockers + angina + CAD, + Peripheral Vascular Disease and +CHF  Normal cardiovascular exam Rhythm:Regular Rate:Normal  ECHO 06-08-13: - Normal LV chamber size with mild LVH and LVEF 60-65%, mid to basal inferolateral hypokinesis noted. There has been improvement in LVEF compared to the prior study. Grade 1 diastolic dysfunction. Moderate left atrial enlargement. MAC with thickened mitral valve and trivial mitral regurgitation. Moderate calcific aortic stenosis as outlined above. PASP 30 mm mercury.    Neuro/Psych Anxiety  Neuromuscular disease    GI/Hepatic Neg liver ROS, hiatal hernia, GERD  ,  Endo/Other  diabetesHypothyroidism   Renal/GU Renal disease  negative genitourinary   Musculoskeletal  (+) Arthritis ,   Abdominal (+) + obese,   Peds negative pediatric ROS (+)  Hematology  (+) anemia ,   Anesthesia Other Findings   Reproductive/Obstetrics negative OB ROS                          Anesthesia Physical Anesthesia Plan  ASA: IV  Anesthesia Plan: MAC   Post-op Pain Management:    Induction: Intravenous  Airway Management Planned: Natural Airway  Additional Equipment:   Intra-op Plan:   Post-operative Plan:   Informed Consent: I have reviewed the patients History and Physical, chart,  labs and discussed the procedure including the risks, benefits and alternatives for the proposed anesthesia with the patient or authorized representative who has indicated his/her understanding and acceptance.   Dental advisory given  Plan Discussed with: CRNA  Anesthesia Plan Comments:        Anesthesia Quick Evaluation

## 2015-06-14 NOTE — Discharge Instructions (Signed)
YOU HAD AN ENDOSCOPIC PROCEDURE TODAY: Refer to the procedure report that was given to you for any specific questions about what was found during the examination.  If the procedure report does not answer your questions, please call your gastroenterologist to clarify. ° °YOU SHOULD EXPECT: Some feelings of bloating in the abdomen. Passage of more gas than usual.  Walking can help get rid of the air that was put into your GI tract during the procedure and reduce the bloating. If you had a lower endoscopy (such as a colonoscopy or flexible sigmoidoscopy) you may notice spotting of blood in your stool or on the toilet paper.  ° °DIET: Your first meal following the procedure should be a light meal and then it is ok to progress to your normal diet.  A half-sandwich or bowl of soup is an example of a good first meal.  Heavy or fried foods are harder to digest and may make you feel nasueas or bloated.  Drink plenty of fluids but you should avoid alcoholic beverages for 24 hours. ° °ACTIVITY: Your care partner should take you home directly after the procedure.  You should plan to take it easy, moving slowly for the rest of the day.  You can resume normal activity the day after the procedure however you should NOT DRIVE or use heavy machinery for 24 hours (because of the sedation medicines used during the test).   ° °SYMPTOMS TO REPORT IMMEDIATELY  °A gastroenterologist can be reached at any hour.  Please call your doctor's office for any of the following symptoms: ° °· Following lower endoscopy (colonoscopy, flexible sigmoidoscopy) ° Excessive amounts of blood in the stool ° Significant tenderness, worsening of abdominal pains ° Swelling of the abdomen that is new, acute ° Fever of 100° or higher °· Following upper endoscopy (EGD, EUS, ERCP) ° Vomiting of blood or coffee ground material ° New, significant abdominal pain ° New, significant chest pain or pain under the shoulder blades ° Painful or persistently difficult  swallowing ° New shortness of breath ° Black, tarry-looking stools ° °FOLLOW UP: °If any biopsies were taken you will be contacted by phone or by letter within the next 1-3 weeks.  Call your gastroenterologist if you have not heard about the biopsies in 3 weeks.  °Please also call your gastroenterologist's office with any specific questions about appointments or follow up tests. ° ° °Monitored Anesthesia Care °Monitored anesthesia care is an anesthesia service for a medical procedure. Anesthesia is the loss of the ability to feel pain. It is produced by medicines called anesthetics. It may affect a small area of your body (local anesthesia), a large area of your body (regional anesthesia), or your entire body (general anesthesia). The need for monitored anesthesia care depends your procedure, your condition, and the potential need for regional or general anesthesia. It is often provided during procedures where:  °· General anesthesia may be needed if there are complications. This is because you need special care when you are under general anesthesia.   °· You will be under local or regional anesthesia. This is so that you are able to have higher levels of anesthesia if needed.   °· You will receive calming medicines (sedatives). This is especially the case if sedatives are given to put you in a semi-conscious state of relaxation (deep sedation). This is because the amount of sedative needed to produce this state can be hard to predict. Too much of a sedative can produce general anesthesia. °Monitored anesthesia care   is performed by one or more health care providers who have special training in all types of anesthesia. You will need to meet with these health care providers before your procedure. During this meeting, they will ask you about your medical history. They will also give you instructions to follow. (For example, you will need to stop eating and drinking before your procedure. You may also need to stop or  change medicines you are taking.) During your procedure, your health care providers will stay with you. They will:  °· Watch your condition. This includes watching your blood pressure, breathing, and level of pain.   °· Diagnose and treat problems that occur.   °· Give medicines if they are needed. These may include calming medicines (sedatives) and anesthetics.   °· Make sure you are comfortable.   °Having monitored anesthesia care does not necessarily mean that you will be under anesthesia. It does mean that your health care providers will be able to manage anesthesia if you need it or if it occurs. It also means that you will be able to have a different type of anesthesia than you are having if you need it. When your procedure is complete, your health care providers will continue to watch your condition. They will make sure any medicines wear off before you are allowed to go home.  °  °This information is not intended to replace advice given to you by your health care provider. Make sure you discuss any questions you have with your health care provider. °  °Document Released: 11/06/2004 Document Revised: 03/03/2014 Document Reviewed: 03/24/2012 °Elsevier Interactive Patient Education ©2016 [employer] ° °

## 2015-06-19 ENCOUNTER — Encounter (HOSPITAL_BASED_OUTPATIENT_CLINIC_OR_DEPARTMENT_OTHER): Payer: Medicare Other | Admitting: Hematology & Oncology

## 2015-06-19 ENCOUNTER — Encounter (HOSPITAL_COMMUNITY): Payer: Self-pay | Admitting: Gastroenterology

## 2015-06-19 VITALS — BP 117/46 | HR 78 | Temp 98.2°F | Resp 22 | Wt 226.0 lb

## 2015-06-19 DIAGNOSIS — D72829 Elevated white blood cell count, unspecified: Secondary | ICD-10-CM | POA: Diagnosis not present

## 2015-06-19 DIAGNOSIS — K8689 Other specified diseases of pancreas: Secondary | ICD-10-CM

## 2015-06-19 DIAGNOSIS — K869 Disease of pancreas, unspecified: Secondary | ICD-10-CM | POA: Diagnosis not present

## 2015-06-19 DIAGNOSIS — D649 Anemia, unspecified: Secondary | ICD-10-CM

## 2015-06-19 NOTE — Patient Instructions (Signed)
[place] at [facility] Discharge Instructions  RECOMMENDATIONS MADE BY THE CONSULTANT AND ANY TEST RESULTS WILL BE SENT TO YOUR REFERRING PHYSICIAN.   Exam and discussion by Dr Whitney Muse today CT scans in June Return to see the doctor after CT scans  Please call the clinic if you have any questions or concerns     Thank you for choosing [place] at [facility] to provide your oncology and hematology care.  To afford each patient quality time with our provider, please arrive at least 15 minutes before your scheduled appointment time.   Beginning January 23rd 2017 lab work for the [facility] will be done in the  Main lab at [facility] on 1st floor. If you have a lab appointment with the [facility] please come in thru the  Main Entrance and check in at the main information desk  You need to re-schedule your appointment should you arrive 10 or more minutes late.  We strive to give you quality time with our providers, and arriving late affects you and other patients whose appointments are after yours.  Also, if you no show three or more times for appointments you may be dismissed from the clinic at the providers discretion.     Again, thank you for choosing [facility].  Our hope is that these requests will decrease the amount of time that you wait before being seen by our physicians.       _____________________________________________________________  Should you have questions after your visit to [facility], please contact our office at [phone] between the hours of 8:30 a.m. and 4:30 p.m.  Voicemails left after 4:30 p.m. will not be returned until the following business day.  For prescription refill requests, have your pharmacy contact our office.         Resources For Cancer Patients and their Caregivers ? American [employer] Society: Can assist with transportation, wigs, general needs, runs Look Good  Feel Better.        (334)525-6565 ? Cancer Care: Provides financial assistance, online support groups, medication/co-pay assistance.  1-800-813-HOPE 873-596-8660) ? [place] Assists [place] cancer patients and their families through emotional , educational and financial support.  762-557-2076 ? [place] DSS Where to apply for food stamps, Medicaid and utility assistance. 276-374-0582 ? [facility]: Transportation to medical appointments. (534)596-6578 ? Social Security Administration: May apply for disability if have a Stage IV cancer. 9027218810 9058040941 ? [employer], Disability and Transit Services: Assists with nutrition, care and transit needs. [phone]

## 2015-06-19 NOTE — Progress Notes (Signed)
[facility] Hematology/Oncology Progress Note  Name: Howard [name]      MRN: [id number]   Date: 06/19/2015 Time:4:19 PM   REFERRING PHYSICIAN:  Victorino December, NP  REASON FOR CONSULT:  Pancreatic mass   DIAGNOSIS:   3.1 x 1.7 x 2.4 cm (low-density) mass in the pancreatic head, concerning for primary pancreatic carcinoma. Pancreatic Cyst Endoscopic Ultrasound 06/14/2015 with Dr. Ardis Hughs cystic lesion in pancreatic head, not malignant or pre-malignant appearing Aspirate with atypical cells favoring reactive mesothelial cells  HISTORY OF PRESENT ILLNESS:   Mr. Bosarge is a 82 year old white American man who is a resident at [employer] with a past medical history significant for CAD (extensive), PVD/PAD (extensive), chronic spondylolisthesis at L5-S1, chronic respiratory failure, diastolic CHF, GERD, hyperlipidemia, ischemic cardiomyopathy, type II DM who is referred to [facility] urgently for newly discovered pancreatic mass. He also has an extensive GI history which includes a right hemicolectomy, pancolonic diverticulosis, tubular adenoma, hyperplastic polyp, and microscopic colitis. He also has a history of pseudomembranous colitis.   He is here today to review recent EUS and for further recommendations.  The patient's dementia is obvious throughout various points of our conversation.  Family is inquiring today about HCPOA and living will. He is here today with his son, grandson and caretaker from his [place].    Review of Systems  Constitutional: Positive for malaise/fatigue. Negative for fever, chills and weight loss.  HENT: Negative.   Eyes: Negative.   Respiratory: Positive for shortness of breath.   Cardiovascular: Negative.   Gastrointestinal: Negative.   Genitourinary: Negative.   Musculoskeletal: Negative.   Skin: Negative.   Neurological: Negative.   Endo/Heme/Allergies: Negative.   Psychiatric/Behavioral:  Negative.      PAST MEDICAL HISTORY:   Past Medical History  Diagnosis Date  . Hypertension   . Hyperlipidemia   . Adenocarcinoma ([place]) 2003    COLON  . PVD (peripheral vascular disease) ([place])     ABI 0.78 RIGHT AND 0.80 LEFT 4/11  . Osteoarthritis     OF KNEES  . Kidney stone   . Schatzki's ring 08/05/10    egd with Dr. Gala Romney  . Hiatal hernia 08/05/10    egd with Dr. Gala Romney  . Esophageal dysmotility   . Microscopic colitis   . Internal hemorrhoids   . Diverticula of colon   . Coronary artery stenosis     a. 06/2011 Cath: LM 20, LAD min irregs, D1 95 - small,  LCX 60p, RCA 40m, 50d, PLA 50ost;  b. 06/2011 PCI/Rota  RCA  -> 3.0x66mm Promus Element DES  . Angina   . COPD (chronic obstructive pulmonary disease) ([place])   . Asthma   . Pneumonia ~ 2005; 09/2005  . Shortness of breath 07/17/11    "laying down"  . Type II diabetes mellitus ([place])   . GERD (gastroesophageal reflux disease)   . Anxiety   . Ischemic cardiomyopathy     a. 05/2011 Echo: EF 45-50%, inf/post HK  . H. pylori infection 03/01/12    treated with prevpac  . Tubular adenoma   . Hyperplastic polyp of intestine   . Pancreatic mass 05/28/2015    neoplasm of pancreas  . Mobility impaired     wheelchair dependent  . Hypothyroidism   . CHF (congestive heart failure) ([facility])     chronic diastolic  . Dysphagia   . Anemia   . On supplemental oxygen therapy  oxygen @ 2 l/m nasally    ALLERGIES: No Known Allergies    MEDICATIONS: I have reviewed the patient's current medications.    Current Outpatient Prescriptions on File Prior to Visit  Medication Sig Dispense Refill  . albuterol (PROVENTIL) (2.5 MG/3ML) 0.083% nebulizer solution Take 2.5 mg by nebulization every 6 (six) hours.    . budesonide-formoterol (SYMBICORT) 160-4.5 MCG/ACT inhaler Inhale 2 puffs into the lungs 2 (two) times daily.    . carvedilol (COREG) 3.125 MG tablet Take 3.125 mg by mouth 2 (two) times daily with a meal.     . Cinnamon 500 MG TABS  Take 500 mg by mouth daily.    . clopidogrel (PLAVIX) 75 MG tablet Take 1 tablet (75 mg total) by mouth daily. 90 tablet 3  . DULoxetine (CYMBALTA) 60 MG capsule Take 60 mg by mouth daily.    Marland Kitchen gabapentin (NEURONTIN) 100 MG capsule Take 100 mg by mouth at bedtime.    Marland Kitchen HYDROcodone-acetaminophen (NORCO/VICODIN) 5-325 MG tablet Take 1 tablet by mouth every 8 (eight) hours.    . insulin aspart (NOVOLOG) 100 UNIT/ML injection Inject 5 Units into the skin 3 (three) times daily before meals.    . insulin glargine (LANTUS) 100 UNIT/ML injection Inject 25 Units into the skin at bedtime.     Marland Kitchen ipratropium-albuterol (DUONEB) 0.5-2.5 (3) MG/3ML SOLN Take 3 mLs by nebulization every 6 (six) hours as needed (wheezing/shortness of breathe.).    Marland Kitchen levothyroxine (SYNTHROID, LEVOTHROID) 50 MCG tablet Take 50 mcg by mouth daily before breakfast.    . lisinopril (PRINIVIL,ZESTRIL) 2.5 MG tablet Take 2.5 mg by mouth daily.    Marland Kitchen LORazepam (ATIVAN) 0.5 MG tablet Take 0.5 mg by mouth daily.     . meclizine (ANTIVERT) 12.5 MG tablet Take 12.5 mg by mouth 2 (two) times daily.     . Menthol, Topical Analgesic, (BIOFREEZE EX) Apply 1 application topically 2 (two) times daily. Left elbow x30 days    . OXYGEN Inhale 2 L into the lungs continuous.    . polyethylene glycol (MIRALAX / GLYCOLAX) packet Take 17 g by mouth daily.    . potassium chloride SA (K-DUR,KLOR-CON) 20 MEQ tablet Take 20 mEq by mouth daily.    Marland Kitchen Propylene Glycol (SYSTANE BALANCE) 0.6 % SOLN Apply 1 drop to eye 3 (three) times daily.     . simvastatin (ZOCOR) 40 MG tablet Take 40 mg by mouth every evening.    . temazepam (RESTORIL) 7.5 MG capsule Take 7.5 mg by mouth at bedtime as needed for sleep.    Marland Kitchen tiotropium (SPIRIVA) 18 MCG inhalation capsule Place 18 mcg into inhaler and inhale daily.    Marland Kitchen torsemide (DEMADEX) 20 MG tablet Take 2 tablets (40 mg total) by mouth daily. 60 tablet 6   No current facility-administered medications on file prior to visit.       PAST SURGICAL HISTORY Past Surgical History  Procedure Laterality Date  . Hemicolectomy  2003  . Appendectomy    . Colonoscopy  04/2001    Dr. Laural Golden- colon carcinoma  . Esophagogastroduodenoscopy  04/2001    Dr. Laural Golden  . Esophagogastroduodenoscopy  08/05/10    Dr. Evalee Mutton ring, hiatal hernia  . Colonoscopy  09/04/09    Dr. Matthew Folks, hemorrhoids  . Atherectomy  07/17/11  . Coronary angioplasty  07/17/11    rotablator  . Colon surgery    . Cataract extraction, bilateral    . Esophagogastroduodenoscopy  03/01/2012    Dr. Gala Romney- gastric erosions, small  hiatal hernia, +hpylori,=treated with prevpac  . Colonoscopy  03/11/2012    Dr. Sabino Gasser R hemicolectomy, pancolonic diverticulosis, tubular adenoma, hyperplastic polyp  . Esophagogastroduodenoscopy N/A 08/11/2013    [id number] Schatzki's ring- s/p dilation as described above. Some retained gastric contents-query delay in gastric emptying. Abnormal gastric mucosa- s/p gastric biopsy (chronic gastritis, intestional metaplasia, no definite h.pylori)  . Savory dilation N/A 08/11/2013    Procedure: SAVORY DILATION;  Surgeon: Daneil Dolin, MD;  Location: [facility];  Service: Endoscopy;  Laterality: N/A;  Venia Minks dilation N/A 08/11/2013    Procedure: Venia Minks DILATION;  Surgeon: Daneil Dolin, MD;  Location: [facility];  Service: Endoscopy;  Laterality: N/A;  . Percutaneous coronary stent intervention (pci-s) N/A 07/17/2011    Procedure: PERCUTANEOUS CORONARY STENT INTERVENTION (PCI-S);  Surgeon: Burnell Blanks, MD;  Location: [facility] CATH LAB;  Service: Cardiovascular;  Laterality: N/A;  . Eus N/A 06/14/2015    Procedure: UPPER ENDOSCOPIC ULTRASOUND (EUS) LINEAR;  Surgeon: Milus Banister, MD;  Location: [facility] ENDOSCOPY;  Service: Endoscopy;  Laterality: N/A;    FAMILY HISTORY: Family History  Problem Relation Age of Onset  . Coronary artery disease Father   . Coronary artery disease Brother     CABG  . Diabetes  Brother   . Coronary artery disease Brother   . Heart Problems Mother   Mother is deceased in her 20 from "old age." Father is deceased at 88 yo from "old age." He has 7 siblings in total, only 1 sister living.   He has 2 sons: 60yrs old with HTN and gouth   24 yo with stroke.  SOCIAL HISTORY: Not likely accurate, but, patient quit smoking 50 years ago after smoking 1/4 ppd x 10 years (son seems confused by his father's response), he denies any EtOH abuse, and illicit drug abuse.  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  He is a widower but married x 63 years.  Review of Systems  Constitutional: Positive for malaise/fatigue. Negative for fever, chills and weight loss.  HENT: Negative.   Eyes: Negative.   Respiratory: Positive for shortness of breath.   Cardiovascular: Negative.   Gastrointestinal: Negative.   Genitourinary: Negative.   Musculoskeletal: Negative.   Skin: Negative.   Neurological: Negative.   Endo/Heme/Allergies: Negative.   Psychiatric/Behavioral: Negative.    14 point review of systems was performed and is negative except as detailed under history of present illness and above    PERFORMANCE STATUS: The patient's performance status is 3 - Symptomatic, >50% confined to bed  PHYSICAL EXAM: Most Recent Vital Signs: Blood pressure 117/46, pulse 78, temperature 98.2 F (36.8 C), temperature source Oral, resp. rate 22, weight 226 lb (102.513 kg), SpO2 97 %, peak flow 2 L/min. General appearance: alert, cooperative, appears stated age, no distress, moderately obese and pleasantly demented/confused, accompanied by son and grandson Head: Normocephalic, without obvious abnormality, atraumatic Eyes: negative findings: lids and lashes normal, conjunctivae and sclerae normal and corneas clear Throat: normal findings: lips normal without lesions, buccal mucosa normal and tongue midline and normal Neck: no adenopathy and supple,  symmetrical, trachea midline Lungs: diminished breath sounds bilaterally Heart: regular rate and rhythm, S1, S2 normal and systolic murmur: systolic ejection 2/6, blowing at 2nd right intercostal space Extremities: Homans sign is negative, no sign of DVT Skin: Skin color, texture, turgor normal. No rashes or lesions Lymph nodes: Cervical, supraclavicular, and axillary nodes normal. Neurologic: Mental status: alertness:  alert, orientation: time, person, affect: normal, pleasantly confused and unable to completely follow conversation or understand difficult material.  LABORATORY DATA:  I have reviewed the data as listed.  CBC    Component Value Date/Time   WBC 11.9* 05/28/2015 1329   RBC 2.88* 05/28/2015 1329   RBC 2.88* 05/28/2015 1329   HGB 10.0* 05/28/2015 1329   HGB 11.5 11/04/2012   HCT 29.9* 05/28/2015 1329   HCT 35 11/04/2012   PLT 210 05/28/2015 1329   MCV 103.8* 05/28/2015 1329   MCV 98.3 11/04/2012   MCH 34.7* 05/28/2015 1329   MCHC 33.4 05/28/2015 1329   RDW 15.1 05/28/2015 1329   LYMPHSABS 1.4 05/28/2015 1329   MONOABS 1.3* 05/28/2015 1329   EOSABS 0.2 05/28/2015 1329   BASOSABS 0.0 05/28/2015 1329      Chemistry      Component Value Date/Time   NA 135 05/28/2015 1329   K 4.2 05/28/2015 1329   K 4.5 11/08/2012   CL 97* 05/28/2015 1329   CO2 29 05/28/2015 1329   BUN 24* 05/28/2015 1329   BUN 23* 11/08/2012   CREATININE 1.23 05/28/2015 1329   CREATININE 1.4* 11/08/2012   CREATININE 0.85 07/07/2011 1419      Component Value Date/Time   CALCIUM 8.6* 05/28/2015 1329   ALKPHOS 75 05/28/2015 1329   ALKPHOS 83 11/04/2012   AST 28 05/28/2015 1329   AST 19 11/04/2012   ALT 29 05/28/2015 1329   BILITOT 0.5 05/28/2015 1329   BILITOT 0.5 11/04/2012      CA 19-9 Latest Ref Range: 0-35 U/mL 46 (H)    RADIOGRAPHY:  CLINICAL DATA: Abdominal pain and distention.  EXAM: CT ABDOMEN AND PELVIS WITH CONTRAST  TECHNIQUE: Multidetector CT imaging of the  abdomen and pelvis was performed using the standard protocol following bolus administration of intravenous contrast.  CONTRAST: 154mL OMNIPAQUE IOHEXOL 300 MG/ML SOLN  COMPARISON: CT scans dated 06/27/2009 and 02/09/2009  FINDINGS: Lower chest: Extensive aortic atherosclerosis and coronary artery calcification. Slight atelectasis at the left lung base.  Hepatobiliary: Numerous small benign hepatic cyst. Focal new area of fatty infiltration of the posterior superior aspect of the left lobe of the liver. New 6 mm stone in the gallbladder. Bile ducts are normal.  Pancreas: The patient has developed a 3.1 x 1.7 x 2.4 cm low-density mass in the pancreatic head consistent with primary carcinoma of the pancreas. No bile duct dilatation. No pancreatic duct dilatation.  Spleen: Normal.  Adrenals/Urinary Tract: Adrenal glands are normal. 14 mm cyst on the mid right kidney. Small bilateral renal calculi. Slight malrotation of the right kidney. Normal ureters and bladder.  Stomach/Bowel: Previous right hemicolectomy. Diverticulosis of the sigmoid portion of the colon.  Vascular/Lymphatic: Extensive aortic and iliac and femoral atherosclerosis. No adenopathy.  Reproductive: Normal.  Other: No free air or free fluid.  Musculoskeletal: No acute abnormality. 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  Chronic spondylolisthesis at L5-S1, unchanged. Chronic sclerosis of the T10 vertebral body.  IMPRESSION: 1. New mass in the pancreatic head consistent with primary carcinoma of the pancreas. No discrete metastases. 2. New small stone in the gallbladder. 3. New focal fatty infiltration in the left lobe of the liver.   Electronically Signed  By: Lorriane Shire M.D.  On: 05/18/2015 11:41    PATHOLOGY:  N/A  ASSESSMENT/PLAN:   Pancreatic mass Pancreatic mass, measuring 3.1 cm  in head of pancreas concerning for pancreatic cancer.  EUS c/w pancreatic cyst that is not malignant nor pre-malignant appearing.  I discussed the aspirate results with Dr. Saralyn Pilar, he does not feel malignancy is likely. I have recommended follow-up CT in several months. Family is agreeable.    We broached the topic of code status.  No changes at this time regarding his FULL CODE status.  Family wishes to proceed with HCPOA and living will. We will have them meet with social work today.  CA 19-9 was mildly elevated and not unexpected even with a cystic lesion  Anemia Labs reviewed with family. Fairly unremarkable would not pursue further workup at this time.   Leukocytosis Chronic leukocytosis dates back to at least 2014.  Follow-up after repeat CT imaging in 3 months.   Orders Placed This Encounter  Procedures  . CT Abdomen W Contrast    PT DOES NOT DRINK READICAT PER MARY P IN CT, JH/06/19/15    Standing Status: Future     Number of Occurrences:      Standing Expiration Date: 06/18/2016    Order Specific Question:  If indicated for the ordered procedure, I authorize the administration of contrast media per Radiology protocol    Answer:  Yes    Order Specific Question:  Reason for Exam (SYMPTOM  OR DIAGNOSIS REQUIRED)    Answer:  re-evaluate pancreatic mass    Order Specific Question:  Preferred imaging location?    Answer:  [facility]   All questions were answered. The patient knows to call the clinic with any problems, questions or concerns. We can certainly see the patient much sooner if necessary.  This document serves as a record of services personally performed by Ancil Linsey, MD. It was created on her behalf by Toni Amend, a trained medical scribe. The creation of this record is based on the scribe's personal observations and the provider's statements to them. This document has been checked and approved by the attending provider.  I have reviewed the above  documentation for accuracy and completeness and I agree with the above.  This note is electronically signed by  Molli Hazard, MD  06/19/2015 4:19 PM

## 2015-06-20 ENCOUNTER — Ambulatory Visit: Payer: Medicare Other | Admitting: Nurse Practitioner

## 2015-06-21 ENCOUNTER — Encounter (HOSPITAL_COMMUNITY): Payer: Self-pay | Admitting: Hematology & Oncology

## 2015-07-02 ENCOUNTER — Encounter: Payer: Self-pay | Admitting: Gastroenterology

## 2015-07-03 ENCOUNTER — Encounter: Payer: Self-pay | Admitting: Gastroenterology

## 2015-08-20 ENCOUNTER — Ambulatory Visit (HOSPITAL_COMMUNITY)
Admission: RE | Admit: 2015-08-20 | Discharge: 2015-08-20 | Disposition: A | Discharge status: Home / Self Care | Payer: Medicare Other | Source: Ambulatory Visit | Attending: Hematology & Oncology | Admitting: Hematology & Oncology

## 2015-08-20 DIAGNOSIS — K8689 Other specified diseases of pancreas: Secondary | ICD-10-CM

## 2015-08-20 DIAGNOSIS — I700 Atherosclerosis of aorta: Secondary | ICD-10-CM | POA: Insufficient documentation

## 2015-08-20 DIAGNOSIS — K8020 Calculus of gallbladder without cholecystitis without obstruction: Secondary | ICD-10-CM | POA: Diagnosis not present

## 2015-08-20 DIAGNOSIS — K869 Disease of pancreas, unspecified: Secondary | ICD-10-CM | POA: Insufficient documentation

## 2015-08-20 LAB — POCT I-STAT CREATININE: CREATININE: 1.4 mg/dL — AB (ref 0.61–1.24)

## 2015-08-20 MED ORDER — IOPAMIDOL (ISOVUE-300) INJECTION 61%
100.0000 mL | Freq: Once | INTRAVENOUS | Status: AC | PRN
  Administered 2015-08-20: 100 mL via INTRAVENOUS

## 2015-08-21 ENCOUNTER — Encounter (HOSPITAL_COMMUNITY): Payer: Self-pay | Admitting: Hematology & Oncology

## 2015-08-21 ENCOUNTER — Encounter (HOSPITAL_COMMUNITY): Payer: Medicare Other | Attending: Oncology | Admitting: Hematology & Oncology

## 2015-08-21 VITALS — BP 122/51 | HR 87 | Temp 97.8°F | Resp 16 | Wt 194.3 lb

## 2015-08-21 DIAGNOSIS — Z87442 Personal history of urinary calculi: Secondary | ICD-10-CM | POA: Insufficient documentation

## 2015-08-21 DIAGNOSIS — E119 Type 2 diabetes mellitus without complications: Secondary | ICD-10-CM | POA: Insufficient documentation

## 2015-08-21 DIAGNOSIS — Z79899 Other long term (current) drug therapy: Secondary | ICD-10-CM | POA: Insufficient documentation

## 2015-08-21 DIAGNOSIS — Z87891 Personal history of nicotine dependence: Secondary | ICD-10-CM | POA: Insufficient documentation

## 2015-08-21 DIAGNOSIS — K869 Disease of pancreas, unspecified: Secondary | ICD-10-CM | POA: Insufficient documentation

## 2015-08-21 DIAGNOSIS — J449 Chronic obstructive pulmonary disease, unspecified: Secondary | ICD-10-CM | POA: Insufficient documentation

## 2015-08-21 DIAGNOSIS — I10 Essential (primary) hypertension: Secondary | ICD-10-CM | POA: Insufficient documentation

## 2015-08-21 DIAGNOSIS — Z794 Long term (current) use of insulin: Secondary | ICD-10-CM | POA: Insufficient documentation

## 2015-08-21 DIAGNOSIS — I255 Ischemic cardiomyopathy: Secondary | ICD-10-CM | POA: Insufficient documentation

## 2015-08-21 DIAGNOSIS — K449 Diaphragmatic hernia without obstruction or gangrene: Secondary | ICD-10-CM | POA: Insufficient documentation

## 2015-08-21 DIAGNOSIS — R109 Unspecified abdominal pain: Secondary | ICD-10-CM | POA: Insufficient documentation

## 2015-08-21 DIAGNOSIS — E785 Hyperlipidemia, unspecified: Secondary | ICD-10-CM | POA: Insufficient documentation

## 2015-08-21 DIAGNOSIS — M1990 Unspecified osteoarthritis, unspecified site: Secondary | ICD-10-CM | POA: Insufficient documentation

## 2015-08-21 DIAGNOSIS — D649 Anemia, unspecified: Secondary | ICD-10-CM | POA: Diagnosis not present

## 2015-08-21 DIAGNOSIS — Z833 Family history of diabetes mellitus: Secondary | ICD-10-CM | POA: Insufficient documentation

## 2015-08-21 DIAGNOSIS — I5032 Chronic diastolic (congestive) heart failure: Secondary | ICD-10-CM | POA: Insufficient documentation

## 2015-08-21 DIAGNOSIS — Z9889 Other specified postprocedural states: Secondary | ICD-10-CM | POA: Insufficient documentation

## 2015-08-21 DIAGNOSIS — D72829 Elevated white blood cell count, unspecified: Secondary | ICD-10-CM | POA: Insufficient documentation

## 2015-08-21 DIAGNOSIS — K219 Gastro-esophageal reflux disease without esophagitis: Secondary | ICD-10-CM | POA: Insufficient documentation

## 2015-08-21 DIAGNOSIS — I2510 Atherosclerotic heart disease of native coronary artery without angina pectoris: Secondary | ICD-10-CM | POA: Insufficient documentation

## 2015-08-21 DIAGNOSIS — I739 Peripheral vascular disease, unspecified: Secondary | ICD-10-CM | POA: Insufficient documentation

## 2015-08-21 DIAGNOSIS — F0390 Unspecified dementia without behavioral disturbance: Secondary | ICD-10-CM

## 2015-08-21 DIAGNOSIS — Z8489 Family history of other specified conditions: Secondary | ICD-10-CM | POA: Insufficient documentation

## 2015-08-21 DIAGNOSIS — J45909 Unspecified asthma, uncomplicated: Secondary | ICD-10-CM | POA: Insufficient documentation

## 2015-08-21 DIAGNOSIS — M4316 Spondylolisthesis, lumbar region: Secondary | ICD-10-CM | POA: Insufficient documentation

## 2015-08-21 DIAGNOSIS — K8689 Other specified diseases of pancreas: Secondary | ICD-10-CM

## 2015-08-21 NOTE — Progress Notes (Signed)
[facility] Hematology/Oncology Progress Note  Name: Howard [name]      MRN: [id number]   Date: 08/21/2015 Time:2:51 PM   REFERRING PHYSICIAN:  Victorino December, NP  REASON FOR CONSULT:  Pancreatic mass   DIAGNOSIS:   3.1 x 1.7 x 2.4 cm (low-density) mass in the pancreatic head, concerning for primary pancreatic carcinoma. Pancreatic Cyst Endoscopic Ultrasound 06/14/2015 with Dr. Ardis Hughs cystic lesion in pancreatic head, not malignant or pre-malignant appearing Aspirate with atypical cells favoring reactive mesothelial cells CT scans 08/20/2015 persistent but slightly less conspicuous hypo-attenuating lesion identified in the head of the pancreas, could be cystic abnormality  HISTORY OF PRESENT ILLNESS:   Howard [name] is a 82 year old white American man who is a resident at [employer] [employer] with a past medical history significant for CAD (extensive), PVD/PAD (extensive), chronic spondylolisthesis at L5-S1, chronic respiratory failure, diastolic CHF, GERD, hyperlipidemia, ischemic cardiomyopathy, type II DM who is referred to [facility] urgently for newly discovered pancreatic mass. He also has an extensive GI history which includes a right hemicolectomy, pancolonic diverticulosis, tubular adenoma, hyperplastic polyp, and microscopic colitis. He also has a history of pseudomembranous colitis.   Howard [name] returns to the [facility] today accompanied by his son, who was unable to attend in the past due to having a stroke. He is also accompanied by a younger male caregiver.  He asks if we can take care of his eye. He notes something about shots in his legs, which his caregiver notes were given in [place]. He was reminded that, last time we were here, we talked about what a bummer it is to get old.   His son notes "they think he's too old for a knee replacement."  Howard [name] says that he he had all of his teeth out. He also indicates that he  isn't a huge fan of the food they serve him at the home. Again, his dementia is obvious throughout the appointment.  He and family present today to review recent CT scans.    PAST MEDICAL HISTORY:   Past Medical History  Diagnosis Date  . Hypertension   . Hyperlipidemia   . Adenocarcinoma ([place]) 2003    COLON  . PVD (peripheral vascular disease) ([place])     ABI 0.78 RIGHT AND 0.80 LEFT 4/11  . Osteoarthritis     OF KNEES  . Kidney stone   . Schatzki's ring 08/05/10    egd with Dr. Gala Romney  . Hiatal hernia 08/05/10    egd with Dr. Gala Romney  . Esophageal dysmotility   . Microscopic colitis   . Internal hemorrhoids   . Diverticula of colon   . Coronary artery stenosis     a. 06/2011 Cath: LM 20, LAD min irregs, D1 95 - small,  LCX 60p, RCA 56m 50d, PLA 50ost;  b. 06/2011 PCI/Rota  RCA  -> 3.0x278mPromus Element DES  . Angina   . COPD (chronic obstructive pulmonary disease) ([place]  . Asthma   . Pneumonia ~ 2005; 09/2005  . Shortness of breath 07/17/11    "laying down"  . Type II diabetes mellitus [place]  . GERD (gastroesophageal reflux disease)   . Anxiety   . Ischemic cardiomyopathy     a. 05/2011 Echo: EF 45-50%, inf/post HK  . H. pylori infection 03/01/12    treated with prevpac  . Tubular adenoma   . Hyperplastic polyp of intestine   .  Pancreatic mass 05/28/2015    neoplasm of pancreas  . Mobility impaired     wheelchair dependent  . Hypothyroidism   . CHF (congestive heart failure) (HCC)     chronic diastolic  . Dysphagia   . Anemia   . On supplemental oxygen therapy     oxygen @ 2 l/m nasally    ALLERGIES: No Known Allergies    MEDICATIONS: I have reviewed the patient's current medications.    Current Outpatient Prescriptions on File Prior to Visit  Medication Sig Dispense Refill  . acetaminophen (TYLENOL) 325 MG tablet Take 650 mg by mouth every 6 (six) hours as needed.    Marland Kitchen albuterol (PROVENTIL) (2.5 MG/3ML) 0.083% nebulizer solution Take 2.5 mg by nebulization every  6 (six) hours.    . budesonide-formoterol (SYMBICORT) 160-4.5 MCG/ACT inhaler Inhale 2 puffs into the lungs 2 (two) times daily.    . carvedilol (COREG) 3.125 MG tablet Take 3.125 mg by mouth 2 (two) times daily with a meal.     . Cinnamon 500 MG TABS Take 500 mg by mouth daily.    . clopidogrel (PLAVIX) 75 MG tablet Take 1 tablet (75 mg total) by mouth daily. 90 tablet 3  . dexlansoprazole (DEXILANT) 60 MG capsule Take 60 mg by mouth daily.    . DULoxetine (CYMBALTA) 60 MG capsule Take 60 mg by mouth daily.    Marland Kitchen gabapentin (NEURONTIN) 100 MG capsule Take 100 mg by mouth at bedtime.    Marland Kitchen HYDROcodone-acetaminophen (NORCO/VICODIN) 5-325 MG tablet Take 1 tablet by mouth every 8 (eight) hours.    . insulin aspart (NOVOLOG) 100 UNIT/ML injection Inject 5 Units into the skin 3 (three) times daily before meals.    . insulin glargine (LANTUS) 100 UNIT/ML injection Inject 25 Units into the skin at bedtime.     Marland Kitchen ipratropium-albuterol (DUONEB) 0.5-2.5 (3) MG/3ML SOLN Take 3 mLs by nebulization every 6 (six) hours as needed (wheezing/shortness of breathe.).    Marland Kitchen levothyroxine (SYNTHROID, LEVOTHROID) 50 MCG tablet Take 50 mcg by mouth daily before breakfast.    . lisinopril (PRINIVIL,ZESTRIL) 2.5 MG tablet Take 2.5 mg by mouth daily.    Marland Kitchen LORazepam (ATIVAN) 0.5 MG tablet Take 0.5 mg by mouth daily.     . meclizine (ANTIVERT) 12.5 MG tablet Take 12.5 mg by mouth 2 (two) times daily.     . Menthol, Topical Analgesic, (BIOFREEZE EX) Apply 1 application topically 2 (two) times daily. Left elbow x30 days    . OXYGEN Inhale 2 L into the lungs continuous.    . polyethylene glycol (MIRALAX / GLYCOLAX) packet Take 17 g by mouth daily.    . potassium chloride SA (K-DUR,KLOR-CON) 20 MEQ tablet Take 20 mEq by mouth daily.    Marland Kitchen Propylene Glycol (SYSTANE BALANCE) 0.6 % SOLN Apply 1 drop to eye 3 (three) times daily.     . simvastatin (ZOCOR) 40 MG tablet Take 40 mg by mouth every evening.    . temazepam (RESTORIL) 7.5  MG capsule Take 7.5 mg by mouth at bedtime as needed for sleep.    Marland Kitchen tiotropium (SPIRIVA) 18 MCG inhalation capsule Place 18 mcg into inhaler and inhale daily.    Marland Kitchen torsemide (DEMADEX) 20 MG tablet Take 2 tablets (40 mg total) by mouth daily. 60 tablet 6   No current facility-administered medications on file prior to visit.     PAST SURGICAL HISTORY Past Surgical History  Procedure Laterality Date  . Hemicolectomy  2003  . Appendectomy    .  Colonoscopy  04/2001    Dr. Laural Golden- colon carcinoma  . Esophagogastroduodenoscopy  04/2001    Dr. Laural Golden  . Esophagogastroduodenoscopy  08/05/10    Dr. Evalee Mutton ring, hiatal hernia  . Colonoscopy  09/04/09    Dr. Matthew Folks, hemorrhoids  . Atherectomy  07/17/11  . Coronary angioplasty  07/17/11    rotablator  . Colon surgery    . Cataract extraction, bilateral    . Esophagogastroduodenoscopy  03/01/2012    Dr. Gala Romney- gastric erosions, small hiatal hernia, +hpylori,=treated with prevpac  . Colonoscopy  03/11/2012    Dr. Sabino Gasser R hemicolectomy, pancolonic diverticulosis, tubular adenoma, hyperplastic polyp  . Esophagogastroduodenoscopy N/A 08/11/2013    EKC:MKLKJZ Schatzki's ring- s/p dilation as described above. Some retained gastric contents-query delay in gastric emptying. Abnormal gastric mucosa- s/p gastric biopsy (chronic gastritis, intestional metaplasia, no definite h.pylori)  . Savory dilation N/A 08/11/2013    Procedure: SAVORY DILATION;  Surgeon: Daneil Dolin, MD;  [facility];  Service: Endoscopy;  Laterality: N/A;  Venia Minks dilation N/A 08/11/2013    Procedure: Venia Minks DILATION;  Surgeon: Daneil Dolin, MD;  Location: [facility] ENDO SUITE;  Service: Endoscopy;  Laterality: N/A;  . Percutaneous coronary stent intervention (pci-s) N/A 07/17/2011    Procedure: PERCUTANEOUS CORONARY STENT INTERVENTION (PCI-S);  Surgeon: Burnell Blanks, MD;  [facility] CATH LAB;  Service: Cardiovascular;  Laterality: N/A;  . Eus N/A  06/14/2015    Procedure: UPPER ENDOSCOPIC ULTRASOUND (EUS) LINEAR;  Surgeon: Milus Banister, MD;  Location: [facility] ENDOSCOPY;  Service: Endoscopy;  Laterality: N/A;    FAMILY HISTORY: Family History  Problem Relation Age of Onset  . Coronary artery disease Father   . Coronary artery disease Brother     CABG  . Diabetes Brother   . Coronary artery disease Brother   . Heart Problems Mother   Mother is deceased in her 69 from "old age." Father is deceased at 38 yo from "old age." He has 7 siblings in total, only 1 sister living.   He has 2 sons: 43yr old with HTN and gouth   661yo with stroke.  SOCIAL HISTORY: Not likely accurate, but, patient quit smoking 50 years ago after smoking 1/4 ppd x 10 years (son seems confused by his father's response), he denies any EtOH abuse, and illicit drug abuse.  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  Review of Systems  Constitutional: Positive for malaise/fatigue. Negative for fever, chills and weight loss.  HENT: Negative.   Eyes: Negative.   Respiratory: Positive for shortness of breath.   Cardiovascular: Negative.   Gastrointestinal: Negative.   Genitourinary: Negative.   Musculoskeletal: Negative.   Skin: Negative.   Neurological: Negative.   Endo/Heme/Allergies: Negative.   Psychiatric/Behavioral: Dementia   14 point review of systems was performed and is negative except as detailed under history of present illness and above    PERFORMANCE STATUS: The patient's performance status is 3 - Symptomatic, >50% confined to bed  PHYSICAL EXAM: Most Recent Vital Signs: Blood pressure 122/51, pulse 87, temperature 97.8 F (36.6 C), temperature source Oral, resp. rate 16, weight 194 lb 4.8 oz (88.134 kg), SpO2 98 %. General appearance: alert, cooperative, appears stated age, no distress, moderately obese and pleasantly demented/confused, accompanied by son and  caregiver Head: Normocephalic, without obvious abnormality, atraumatic Eyes: negative findings: lids and lashes normal, conjunctivae and sclerae normal  and corneas clear Throat: normal findings: lips normal without lesions, buccal mucosa normal and tongue midline and normal Neck: no adenopathy and supple, symmetrical, trachea midline Lungs: diminished breath sounds bilaterally Heart: regular rate and rhythm, S1, S2 normal and systolic murmur: systolic ejection 2/6, blowing at 2nd right intercostal space Extremities: Homans sign is negative, no sign of DVT Skin: Skin color, texture, turgor normal. No rashes or lesions Lymph nodes: Cervical, supraclavicular, and axillary nodes normal. Neurologic: Mental status: alertness: alert, orientation: time, person, affect: normal, pleasantly confused and unable to completely follow conversation or understand difficult material.  LABORATORY DATA:  I have reviewed the data as listed. Results for LENG, MONTESDEOCA (MRN [id number]) as of 09/08/2015 22:21  Ref. Range 05/28/2015 13:29  Sodium Latest Ref Range: 135-145 mmol/L 135  Potassium Latest Ref Range: 3.5-5.1 mmol/L 4.2  Chloride Latest Ref Range: 101-111 mmol/L 97 (L)  CO2 Latest Ref Range: 22-32 mmol/L 29  BUN Latest Ref Range: 6-20 mg/dL 24 (H)  Creatinine Latest Ref Range: 0.61-1.24 mg/dL 1.23  Calcium Latest Ref Range: 8.9-10.3 mg/dL 8.6 (L)  EGFR (Non-African Amer.) Latest Ref Range: >60 mL/min 52 (L)  EGFR (African American) Latest Ref Range: >60 mL/min >60  Glucose Latest Ref Range: 65-99 mg/dL 153 (H)  Anion gap Latest Ref Range: 5-15  9  Alkaline Phosphatase Latest Ref Range: 38-126 U/L 75  Albumin Latest Ref Range: 3.5-5.0 g/dL 3.7  AST Latest Ref Range: 15-41 U/L 28  ALT Latest Ref Range: 17-63 U/L 29  Total Protein Latest Ref Range: 6.5-8.1 g/dL 6.9  Total Bilirubin Latest Ref Range: 0.3-1.2 mg/dL 0.5   Results for HEROLD, SALGUERO (MRN [id number]) as of 09/08/2015 22:21  Ref. Range 05/28/2015  13:29  WBC Latest Ref Range: 4.0-10.5 K/uL 11.9 (H)  RBC Latest Ref Range: 4.22-5.81 MIL/uL 2.88 (L)  Hemoglobin Latest Ref Range: 13.0-17.0 g/dL 10.0 (L)  HCT Latest Ref Range: 39.0-52.0 % 29.9 (L)  MCV Latest Ref Range: 78.0-100.0 fL 103.8 (H)  MCH Latest Ref Range: 26.0-34.0 pg 34.7 (H)  MCHC Latest Ref Range: 30.0-36.0 g/dL 33.4  RDW Latest Ref Range: 11.5-15.5 % 15.1  Platelets Latest Ref Range: 150-400 K/uL 210  Neutrophils Latest Units: % 75  Lymphocytes Latest Units: % 12  Monocytes Relative Latest Units: % 11  Eosinophil Latest Units: % 2  Basophil Latest Units: % 0  NEUT# Latest Ref Range: 1.7-7.7 K/uL 8.9 (H)  Lymphocyte # Latest Ref Range: 0.7-4.0 K/uL 1.4  Monocyte # Latest Ref Range: 0.1-1.0 K/uL 1.3 (H)  Eosinophils Absolute Latest Ref Range: 0.0-0.7 K/uL 0.2  Basophils Absolute Latest Ref Range: 0.0-0.1 K/uL 0.0  RBC. Latest Ref Range: 4.22-5.81 MIL/uL 2.88 (L)  Retic Ct Pct Latest Ref Range: 0.4-3.1 % 2.1  Retic Count, Manual Latest Ref Range: 19.0-186.0 K/uL 60.5    RADIOGRAPHY:   Study Result     CLINICAL DATA: Pancreatic mass.  EXAM: CT ABDOMEN WITHOUT AND WITH CONTRAST  TECHNIQUE: Multidetector CT imaging of the abdomen was performed following the standard protocol before and following the bolus administration of intravenous contrast.  CONTRAST: 132m ISOVUE-300 IOPAMIDOL (ISOVUE-300) INJECTION 61%  COMPARISON: 05/18/2015  FINDINGS: Lower chest: Left basilar subsegmental atelectasis.  Hepatobiliary: Stable appearance scattered tiny hypodensities in the liver parenchyma, likely cysts. Probable small stone in the dependent gallbladder (image 23 series 4). No intrahepatic or extrahepatic biliary dilation.  Pancreas: There is been slight interval decrease and cons acuity in size of the hypo attenuating lesion identified in the head of the pancreas (  see image 76 series 12 today. Lesion measures approximately 1.6 x 1.7 x 2.3 cm on  today's study, comparing to 3.1 x 1.7 x 2.4 cm previously. Average attenuation centrally within this lesion approaches water density on today's exam. There is no associated dilatation of the main pancreatic duct with diffuse overlying atrophy of the pancreatic parenchyma, as before.  Spleen: No splenomegaly. No focal mass lesion.  Adrenals/Urinary Tract: 10 mm left adrenal nodule stable in the interval. Bilateral nonobstructing renal stones evident with 15 mm exophytic lesion identified in the interpolar right kidney, likely a cyst. No hydronephrosis on either side.  Stomach/Bowel: Stomach is nondistended. No gastric wall thickening. No evidence of outlet obstruction. Duodenum is normally positioned as is the ligament of Treitz. Visualize small bowel loops and colonic segments of the abdomen are nondilated.  Vascular/Lymphatic: Abdominal aortic atherosclerosis noted without aneurysm. There is no gastrohepatic or hepatoduodenal ligament lymphadenopathy. No intraperitoneal or retroperitoneal lymphadenopathy.  Other: No intraperitoneal free fluid.  Musculoskeletal: Incompletely visualized coarsening of trabeculation noted in the iliac crest bilaterally, as before.  IMPRESSION: 1. Persistent but slightly less conspicuous hypo attenuating lesion identified in the head of the pancreas with no associated biliary or pancreatic duct dilatation. Attenuation of this lesion approaches water density and this could be a cystic abnormality. MRI of the abdomen without and with contrast would be the study of choice to further evaluate. 2. Stable left adrenal nodule measuring 10 mm. 3. Abdominal aortic atherosclerosis. 4. Probable hepatic cysts. 5. Cholelithiasis.   Electronically Signed  By: Misty Stanley M.D.  On: 08/20/2015 12:22       PATHOLOGY:       ASSESSMENT/PLAN:   Pancreatic mass We reviewed CT imaging in detail which shows the lesion to be persistent but  less conspicuous. Family wishes to continue to follow. I have ordered repeat imaging in 3 months with follow-up post.   CA 19-9 was mildly elevated and not unexpected even with a cystic lesion. Will repeat CA 19-9 at follow-up.   Anemia Labs reviewed with family. Fairly unremarkable would not pursue further workup at this time.   Leukocytosis Chronic leukocytosis dates back to at least 2014.  Follow-up after repeat CT imaging in 3 months.   Orders Placed This Encounter  Procedures  . CT ABDOMEN W WO CONTRAST    Standing Status: Future     Number of Occurrences:      Standing Expiration Date: 11/20/2016    Order Specific Question:  If indicated for the ordered procedure, I authorize the administration of contrast media per Radiology protocol    Answer:  Yes    Order Specific Question:  Reason for Exam (SYMPTOM  OR DIAGNOSIS REQUIRED)    Answer:  pancreatic mass    Order Specific Question:  Preferred imaging location?    Answer:  [facility]   All questions were answered. The patient knows to call the clinic with any problems, questions or concerns. We can certainly see the patient much sooner if necessary.  This document serves as a record of services personally performed by Ancil Linsey, MD. It was created on her behalf by Toni Amend, a trained medical scribe. The creation of this record is based on the scribe's personal observations and the provider's statements to them. This document has been checked and approved by the attending provider.  I have reviewed the above documentation for accuracy and completeness and I agree with the above.  This note is electronically signed by  Molli Hazard, MD  08/21/2015 2:51 PM

## 2015-08-21 NOTE — Patient Instructions (Signed)
[place] at [facility] Discharge Instructions  RECOMMENDATIONS MADE BY THE CONSULTANT AND ANY TEST RESULTS WILL BE SENT TO YOUR REFERRING PHYSICIAN.  CT abdomen in 3 months  Return to clinic in 3 months  CA 19.9, CBC diff, CMP in 3 months  Thank you for choosing [place] at [facility] to provide your oncology and hematology care.  To afford each patient quality time with our provider, please arrive at least 15 minutes before your scheduled appointment time.   Beginning January 23rd 2017 lab work for the [facility] will be done in the  Main lab at [facility] on 1st floor. If you have a lab appointment with the [facility] please come in thru the  Main Entrance and check in at the main information desk  You need to re-schedule your appointment should you arrive 10 or more minutes late.  We strive to give you quality time with our providers, and arriving late affects you and other patients whose appointments are after yours.  Also, if you no show three or more times for appointments you may be dismissed from the clinic at the providers discretion.     Again, thank you for choosing [facility].  Our hope is that these requests will decrease the amount of time that you wait before being seen by our physicians.       _____________________________________________________________  Should you have questions after your visit to [facility], please contact our office at [phone] between the hours of 8:30 a.m. and 4:30 p.m.  Voicemails left after 4:30 p.m. will not be returned until the following business day.  For prescription refill requests, have your pharmacy contact our office.         Resources For Cancer Patients and their Caregivers ? American Cancer Society: Can assist with transportation, wigs, general needs, runs Look Good Feel Better.        424-595-0318 ? Cancer Care: Provides financial  assistance, online support groups, medication/co-pay assistance.  1-800-813-HOPE 564-636-2345) ? [facility] Assists [place] cancer patients and their families through emotional , educational and financial support.  352-340-8122 ? [place] DSS Where to apply for food stamps, Medicaid and utility assistance. (770)495-8396 ? RCATS: Transportation to medical appointments. (628)251-7382 ? Social Security Administration: May apply for disability if have a Stage IV cancer. 217-017-1939 (315) 706-0438 ? [employer], Disability and Transit Services: Assists with nutrition, care and transit needs. [place] Support Programs: @10RELATIVEDAYS @ > Cancer Support Group  2nd Tuesday of the month 1pm-2pm, Journey Room  > Creative Journey  3rd Tuesday of the month 1130am-1pm, Journey Room  > Look Good Feel Better  1st Wednesday of the month 10am-12 noon, Journey Room (Call [facility] to register [phone])

## 2015-08-23 MED ORDER — CYANOCOBALAMIN 1000 MCG/ML IJ SOLN
INTRAMUSCULAR | Status: AC
  Filled 2015-08-23: qty 1

## 2015-09-08 ENCOUNTER — Encounter (HOSPITAL_COMMUNITY): Payer: Self-pay | Admitting: Hematology & Oncology

## 2015-11-21 ENCOUNTER — Ambulatory Visit (HOSPITAL_COMMUNITY)
Admission: RE | Admit: 2015-11-21 | Discharge: 2015-11-21 | Disposition: A | Discharge status: Home / Self Care | Payer: Medicare Other | Source: Ambulatory Visit | Attending: Hematology & Oncology | Admitting: Hematology & Oncology

## 2015-11-21 DIAGNOSIS — K869 Disease of pancreas, unspecified: Secondary | ICD-10-CM | POA: Insufficient documentation

## 2015-11-21 DIAGNOSIS — I700 Atherosclerosis of aorta: Secondary | ICD-10-CM | POA: Insufficient documentation

## 2015-11-21 DIAGNOSIS — I2510 Atherosclerotic heart disease of native coronary artery without angina pectoris: Secondary | ICD-10-CM | POA: Insufficient documentation

## 2015-11-21 DIAGNOSIS — I358 Other nonrheumatic aortic valve disorders: Secondary | ICD-10-CM | POA: Diagnosis not present

## 2015-11-21 DIAGNOSIS — K8689 Other specified diseases of pancreas: Secondary | ICD-10-CM

## 2015-11-21 LAB — POCT I-STAT CREATININE: Creatinine, Ser: 1.3 mg/dL — ABNORMAL HIGH (ref 0.61–1.24)

## 2015-11-21 MED ORDER — IOPAMIDOL (ISOVUE-300) INJECTION 61%
100.0000 mL | Freq: Once | INTRAVENOUS | Status: AC | PRN
  Administered 2015-11-21: 100 mL via INTRAVENOUS

## 2015-11-27 ENCOUNTER — Encounter (HOSPITAL_COMMUNITY): Payer: Self-pay | Admitting: Hematology & Oncology

## 2015-11-27 ENCOUNTER — Encounter (HOSPITAL_COMMUNITY): Payer: Medicare Other

## 2015-11-27 ENCOUNTER — Encounter (HOSPITAL_COMMUNITY): Payer: Medicare Other | Attending: Hematology & Oncology | Admitting: Hematology & Oncology

## 2015-11-27 VITALS — BP 135/43 | HR 75 | Temp 98.1°F | Resp 20 | Ht 65.75 in | Wt 203.8 lb

## 2015-11-27 DIAGNOSIS — D649 Anemia, unspecified: Secondary | ICD-10-CM

## 2015-11-27 DIAGNOSIS — K8689 Other specified diseases of pancreas: Secondary | ICD-10-CM

## 2015-11-27 DIAGNOSIS — D72828 Other elevated white blood cell count: Secondary | ICD-10-CM

## 2015-11-27 DIAGNOSIS — Z7189 Other specified counseling: Secondary | ICD-10-CM | POA: Diagnosis not present

## 2015-11-27 DIAGNOSIS — D72829 Elevated white blood cell count, unspecified: Secondary | ICD-10-CM | POA: Insufficient documentation

## 2015-11-27 DIAGNOSIS — K869 Disease of pancreas, unspecified: Secondary | ICD-10-CM | POA: Diagnosis present

## 2015-11-27 LAB — CBC WITH DIFFERENTIAL/PLATELET
Basophils Absolute: 0 10*3/uL (ref 0.0–0.1)
Basophils Relative: 0 %
EOS PCT: 2 %
Eosinophils Absolute: 0.3 10*3/uL (ref 0.0–0.7)
HCT: 30.9 % — ABNORMAL LOW (ref 39.0–52.0)
Hemoglobin: 9.9 g/dL — ABNORMAL LOW (ref 13.0–17.0)
LYMPHS ABS: 1.4 10*3/uL (ref 0.7–4.0)
LYMPHS PCT: 12 %
MCH: 33.7 pg (ref 26.0–34.0)
MCHC: 32 g/dL (ref 30.0–36.0)
MCV: 105.1 fL — AB (ref 78.0–100.0)
Monocytes Absolute: 1.5 10*3/uL — ABNORMAL HIGH (ref 0.1–1.0)
Monocytes Relative: 13 %
NEUTROS PCT: 73 %
Neutro Abs: 8.4 10*3/uL — ABNORMAL HIGH (ref 1.7–7.7)
PLATELETS: 187 10*3/uL (ref 150–400)
RBC: 2.94 MIL/uL — AB (ref 4.22–5.81)
RDW: 15.3 % (ref 11.5–15.5)
WBC: 11.6 10*3/uL — AB (ref 4.0–10.5)

## 2015-11-27 LAB — COMPREHENSIVE METABOLIC PANEL
ALT: 16 U/L — AB (ref 17–63)
ANION GAP: 6 (ref 5–15)
AST: 20 U/L (ref 15–41)
Albumin: 3.4 g/dL — ABNORMAL LOW (ref 3.5–5.0)
Alkaline Phosphatase: 65 U/L (ref 38–126)
BUN: 21 mg/dL — ABNORMAL HIGH (ref 6–20)
CHLORIDE: 100 mmol/L — AB (ref 101–111)
CO2: 30 mmol/L (ref 22–32)
CREATININE: 1.12 mg/dL (ref 0.61–1.24)
Calcium: 8.6 mg/dL — ABNORMAL LOW (ref 8.9–10.3)
GFR, EST NON AFRICAN AMERICAN: 58 mL/min — AB (ref >60)
Glucose, Bld: 150 mg/dL — ABNORMAL HIGH (ref 65–99)
Potassium: 4.1 mmol/L (ref 3.5–5.1)
SODIUM: 136 mmol/L (ref 135–145)
Total Bilirubin: 0.3 mg/dL (ref 0.3–1.2)
Total Protein: 6.7 g/dL (ref 6.5–8.1)

## 2015-11-27 NOTE — Assessment & Plan Note (Addendum)
Pancreatic mass, measuring 3.1 cm in head of pancreas concerning for pancreatic cancer.  EUS c/w pancreatic cyst that is not malignant nor pre-malignant appearing.  I discussed the aspirate results with Dr. Saralyn Pilar, he does not feel malignancy is likely.  Repeat CT today is reviewed in detail. A copy is in the chart. Family is not present. Discussed with caregiver that I will discuss with son and Dr. Ardis Hughs to see if further testing is needed/recomended at this point.   CA 19-9 was mildly elevated and not unexpected even with a cystic lesion

## 2015-11-27 NOTE — Patient Instructions (Addendum)
[facility] at [facility] Discharge Instructions  RECOMMENDATIONS MADE BY THE CONSULTANT AND ANY TEST RESULTS WILL BE SENT TO YOUR REFERRING PHYSICIAN.  You saw Dr. Penland today. Follow up in 3 months with lab work.  Thank you for choosing [facility] at [facility] to provide your oncology and hematology care.  To afford each patient quality time with our provider, please arrive at least 15 minutes before your scheduled appointment time.   Beginning January 23rd 2017 lab work for the Cancer Center will be done in the  Main lab at [facility] on 1st floor. If you have a lab appointment with the Cancer Center please come in thru the  Main Entrance and check in at the main information desk  You need to re-schedule your appointment should you arrive 10 or more minutes late.  We strive to give you quality time with our providers, and arriving late affects you and other patients whose appointments are after yours.  Also, if you no show three or more times for appointments you may be dismissed from the clinic at the providers discretion.     Again, thank you for choosing [facility].  Our hope is that these requests will decrease the amount of time that you wait before being seen by our physicians.       _____________________________________________________________  Should you have questions after your visit to [facility], please contact our office at [phone] between the hours of 8:30 a.m. and 4:30 p.m.  Voicemails left after 4:30 p.m. will not be returned until the following business day.  For prescription refill requests, have your pharmacy contact our office.         Resources For Cancer Patients and their Caregivers ? American Cancer Society: Can assist with transportation, wigs, general needs, runs Look Good Feel Better.        1-888-227-6333 ? Cancer Care: Provides financial assistance, online support  groups, medication/co-pay assistance.  1-800-813-HOPE (4673) ? [facility] Assists [place] cancer patients and their families through emotional , educational and financial support.  336-427-4357 ? [place] DSS Where to apply for food stamps, Medicaid and utility assistance. 336-342-1394 ? RCATS: Transportation to medical appointments. 336-347-2287 ? Social Security Administration: May apply for disability if have a Stage IV cancer. 336-342-7796 1-800-772-1213 ? [place] Aging, Disability and Transit Services: Assists with nutrition, care and transit needs. 336-349-2343  Cancer Center Support Programs: @10RELATIVEDAYS@ > Cancer Support Group  2nd Tuesday of the month 1pm-2pm, Journey Room  > Creative Journey  3rd Tuesday of the month 1130am-1pm, Journey Room  > Look Good Feel Better  1st Wednesday of the month 10am-12 noon, Journey Room (Call American Cancer Society to register 1-800-395-5775)    

## 2015-11-27 NOTE — Assessment & Plan Note (Signed)
We broached the topic of code status at prior visits..  No changes at this time regarding his FULL CODE status. Family did meet with services here at [facility] regarding HCPOA, living will etc. Patients sons are not present today for further discussion

## 2015-11-27 NOTE — Progress Notes (Signed)
[facility] Hematology/Oncology Progress Note  Name: Howard [name]      MRN: [id number]   Date: 11/27/2015 Time:1:12 PM   REFERRING PHYSICIAN:  Victorino December, NP  REASON FOR CONSULT:  Pancreatic mass   DIAGNOSIS:   3.1 x 1.7 x 2.4 cm (low-density) mass in the pancreatic head, concerning for primary pancreatic carcinoma. Pancreatic Cyst Endoscopic Ultrasound 06/14/2015 with Dr. Ardis Hughs cystic lesion in pancreatic head, not malignant or pre-malignant appearing Aspirate with atypical cells favoring reactive mesothelial cells CT scans 08/20/2015 persistent but slightly less conspicuous hypo-attenuating lesion identified in the head of the pancreas, could be cystic abnormality  HISTORY OF PRESENT ILLNESS:   Howard [name] is a 82 year old white man with a past medical history significant for CAD (extensive), PVD/PAD (extensive), chronic spondylolisthesis at L5-S1, chronic respiratory failure, diastolic CHF, GERD, hyperlipidemia, ischemic cardiomyopathy, type II DM who presents for ongoing follow-up of a  pancreatic mass. Aspiration of this mass shoed atypical cells favoring reactive mesothelial cells, no malignancy noted.   Patient is demented and accompanied by caregiver.   He notes that he thought he was coming to the dentist. His sons are not with him today.  NO change is reported in baseline PS.  Presents today to review repeat imaging of the abdomen.    PAST MEDICAL HISTORY:   Past Medical History:  Diagnosis Date  . Adenocarcinoma ([place]) 2003   COLON  . Anemia   . Angina   . Anxiety   . Asthma   . CHF (congestive heart failure) (HCC)    chronic diastolic  . COPD (chronic obstructive pulmonary disease) ([place])   . Coronary artery stenosis    a. 06/2011 Cath: LM 20, LAD min irregs, D1 95 - small,  LCX 60p, RCA 109m 50d, PLA 50ost;  b. 06/2011 PCI/Rota  RCA  -> 3.0x286mPromus Element DES  . Diverticula of colon   . Dysphagia   . Esophageal dysmotility   . GERD  (gastroesophageal reflux disease)   . H. pylori infection 03/01/12   treated with prevpac  . Hiatal hernia 08/05/10   egd with Dr. RoGala Romney. Hyperlipidemia   . Hyperplastic polyp of intestine   . Hypertension   . Hypothyroidism   . Internal hemorrhoids   . Ischemic cardiomyopathy    a. 05/2011 Echo: EF 45-50%, inf/post HK  . Kidney stone   . Microscopic colitis   . Mobility impaired    wheelchair dependent  . On supplemental oxygen therapy    oxygen @ 2 l/m nasally  . Osteoarthritis    OF KNEES  . Pancreatic mass 05/28/2015   neoplasm of pancreas  . Pneumonia ~ 2005; 09/2005  . PVD [place]peripheral vascular disease) [place]   ABI 0.78 RIGHT AND 0.80 LEFT 4/11  . Schatzki's ring 08/05/10   egd with Dr. RoGala Romney. Shortness of breath 07/17/11   "laying down"  . Tubular adenoma   . Type II diabetes mellitus (HCC)     ALLERGIES: No Known Allergies    MEDICATIONS: I have reviewed the patient's current medications.    Current Outpatient Prescriptions on File Prior to Visit  Medication Sig Dispense Refill  . acetaminophen (TYLENOL) 325 MG tablet Take 650 mg by mouth every 6 (six) hours as needed.    . Marland Kitchenlbuterol (PROVENTIL) (2.5 MG/3ML) 0.083% nebulizer solution Take 2.5 mg by nebulization every 6 (six) hours.    . budesonide-formoterol (SYMBICORT) 160-4.5 MCG/ACT inhaler Inhale  2 puffs into the lungs 2 (two) times daily.    . carvedilol (COREG) 3.125 MG tablet Take 3.125 mg by mouth 2 (two) times daily with a meal.     . Cinnamon 500 MG TABS Take 500 mg by mouth daily.    . clopidogrel (PLAVIX) 75 MG tablet Take 1 tablet (75 mg total) by mouth daily. 90 tablet 3  . dexlansoprazole (DEXILANT) 60 MG capsule Take 60 mg by mouth daily.    . DULoxetine (CYMBALTA) 60 MG capsule Take 60 mg by mouth daily.    Marland Kitchen gabapentin (NEURONTIN) 100 MG capsule Take 100 mg by mouth at bedtime.    Marland Kitchen HYDROcodone-acetaminophen (NORCO/VICODIN) 5-325 MG tablet Take 1 tablet by mouth every 8 (eight) hours.    .  insulin aspart (NOVOLOG) 100 UNIT/ML injection Inject 5 Units into the skin 3 (three) times daily before meals.    . insulin glargine (LANTUS) 100 UNIT/ML injection Inject 25 Units into the skin at bedtime.     Marland Kitchen ipratropium-albuterol (DUONEB) 0.5-2.5 (3) MG/3ML SOLN Take 3 mLs by nebulization every 6 (six) hours as needed (wheezing/shortness of breathe.).    Marland Kitchen levothyroxine (SYNTHROID, LEVOTHROID) 50 MCG tablet Take 50 mcg by mouth daily before breakfast.    . lisinopril (PRINIVIL,ZESTRIL) 2.5 MG tablet Take 2.5 mg by mouth daily.    Marland Kitchen LORazepam (ATIVAN) 0.5 MG tablet Take 0.5 mg by mouth daily.     . meclizine (ANTIVERT) 12.5 MG tablet Take 12.5 mg by mouth 2 (two) times daily.     . Menthol, Topical Analgesic, (BIOFREEZE EX) Apply 1 application topically 2 (two) times daily. Left elbow x30 days    . OXYGEN Inhale 2 L into the lungs continuous.    . polyethylene glycol (MIRALAX / GLYCOLAX) packet Take 17 g by mouth daily.    . potassium chloride SA (K-DUR,KLOR-CON) 20 MEQ tablet Take 20 mEq by mouth daily.    Marland Kitchen Propylene Glycol (SYSTANE BALANCE) 0.6 % SOLN Apply 1 drop to eye 3 (three) times daily.     . simvastatin (ZOCOR) 40 MG tablet Take 40 mg by mouth every evening.    . temazepam (RESTORIL) 7.5 MG capsule Take 7.5 mg by mouth at bedtime as needed for sleep.    Marland Kitchen tiotropium (SPIRIVA) 18 MCG inhalation capsule Place 18 mcg into inhaler and inhale daily.    Marland Kitchen torsemide (DEMADEX) 20 MG tablet Take 2 tablets (40 mg total) by mouth daily. 60 tablet 6   No current facility-administered medications on file prior to visit.      PAST SURGICAL HISTORY Past Surgical History:  Procedure Laterality Date  . APPENDECTOMY    . ATHERECTOMY  07/17/11  . CATARACT EXTRACTION, BILATERAL    . COLON SURGERY    . COLONOSCOPY  04/2001   Dr. Laural Golden- colon carcinoma  . COLONOSCOPY  09/04/09   Dr. Matthew Folks, hemorrhoids  . COLONOSCOPY  03/11/2012   Dr. Sabino Gasser R hemicolectomy, pancolonic  diverticulosis, tubular adenoma, hyperplastic polyp  . CORONARY ANGIOPLASTY  07/17/11   rotablator  . ESOPHAGOGASTRODUODENOSCOPY  04/2001   Dr. Laural Golden  . ESOPHAGOGASTRODUODENOSCOPY  08/05/10   Dr. Evalee Mutton ring, hiatal hernia  . ESOPHAGOGASTRODUODENOSCOPY  03/01/2012   Dr. Gala Romney- gastric erosions, small hiatal hernia, +hpylori,=treated with prevpac  . ESOPHAGOGASTRODUODENOSCOPY N/A 08/11/2013   BSW:HQPRFF Schatzki's ring- s/p dilation as described above. Some retained gastric contents-query delay in gastric emptying. Abnormal gastric mucosa- s/p gastric biopsy (chronic gastritis, intestional metaplasia, no definite h.pylori)  . EUS N/A 06/14/2015  Procedure: UPPER ENDOSCOPIC ULTRASOUND (EUS) LINEAR;  Surgeon: Milus Banister, MD;  Location: [facility] ENDOSCOPY;  Service: Endoscopy;  Laterality: N/A;  . HEMICOLECTOMY  2003  . MALONEY DILATION N/A 08/11/2013   Procedure: Venia Minks DILATION;  Surgeon: Daneil Dolin, MD;  Location: [facility] ENDO SUITE;  Service: Endoscopy;  Laterality: N/A;  . PERCUTANEOUS CORONARY STENT INTERVENTION (PCI-S) N/A 07/17/2011   Procedure: PERCUTANEOUS CORONARY STENT INTERVENTION (PCI-S);  Surgeon: Burnell Blanks, MD;  Location: [facility] CATH LAB;  Service: Cardiovascular;  Laterality: N/A;  . SAVORY DILATION N/A 08/11/2013   Procedure: SAVORY DILATION;  Surgeon: Daneil Dolin, MD;  Location: [facility] ENDO SUITE;  Service: Endoscopy;  Laterality: N/A;    FAMILY HISTORY: Family History  Problem Relation Age of Onset  . Coronary artery disease Father   . Heart Problems Mother   . Coronary artery disease Brother     CABG  . Diabetes Brother   . Coronary artery disease Brother   Mother is deceased in her 72 from "old age." Father is deceased at 2 yo from "old age." He has 7 siblings in total, only 1 sister living.   He has 2 sons: 48yr old with HTN and gouth   616yo with stroke.  SOCIAL HISTORY: Not likely accurate, but, patient quit smoking 50 years ago after smoking 1/4  ppd x 10 years (son seems confused by his father's response), he denies any EtOH abuse, and illicit drug abuse.  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  Review of Systems  Constitutional: Positive for malaise/fatigue. Negative for fever, chills and weight loss.  HENT: Negative.   Eyes: Negative.   Respiratory: Positive for shortness of breath and cough.   Cardiovascular: Negative.   Gastrointestinal: Negative.   Genitourinary: Negative.   Musculoskeletal: Negative.   Skin: Negative.   Neurological: Negative.   Endo/Heme/Allergies: Negative.   Psychiatric/Behavioral: Dementia  14 point review of systems was performed and is negative except as detailed under history of present illness and above   PERFORMANCE STATUS: The patient's performance status is 3 - Symptomatic, >50% confined to bed  PHYSICAL EXAM: Most Recent Vital Signs: Blood pressure (!) 135/43, pulse 75, temperature 98.1 F (36.7 C), temperature source Oral, resp. rate 20, height 5' 5.75" (1.67 m), weight 203 lb 12.8 oz (92.4 kg), SpO2 97 %. General appearance: alert, cooperative, appears stated age, no distress, moderately obese and pleasantly demented/confused, accompanied by caregiver, physical exam preformed in wheelchair. Patient is actively moving around room in wheelchair throughout our exam and conversation Head: Normocephalic, without obvious abnormality, atraumatic Eyes: negative findings: lids and lashes normal, conjunctivae and sclerae normal and corneas clear Throat: normal findings: lips normal without lesions, buccal mucosa normal and tongue midline and normal Neck: no adenopathy and supple, symmetrical, trachea midline Lungs: diminished breath sounds bilaterally Heart: regular rate and rhythm, S1, S2 normal and systolic murmur: systolic ejection 2/6, blowing at 2nd right intercostal space Extremities: Homans sign is negative, no sign  of DVT Skin: Skin color, texture, turgor normal. No rashes or lesions Lymph nodes: Cervical, supraclavicular, and axillary nodes normal. Neurologic: Mental status: alertness: alert, orientation: time, person, affect: normal, pleasantly confused and unable to completely follow conversation or understand difficult material.  LABORATORY DATA:  I have reviewed the data as listed. Results for PMONTERRIO, GERST(MRN [id number] as of 11/28/2015 18:07  Ref. Range 11/27/2015 12:40  Sodium  Latest Ref Range: 135 - 145 mmol/L 136  Potassium Latest Ref Range: 3.5 - 5.1 mmol/L 4.1  Chloride Latest Ref Range: 101 - 111 mmol/L 100 (L)  CO2 Latest Ref Range: 22 - 32 mmol/L 30  BUN Latest Ref Range: 6 - 20 mg/dL 21 (H)  Creatinine Latest Ref Range: 0.61 - 1.24 mg/dL 1.12  Calcium Latest Ref Range: 8.9 - 10.3 mg/dL 8.6 (L)  EGFR (Non-African Amer.) Latest Ref Range: >60 mL/min 58 (L)  EGFR (African American) Latest Ref Range: >60 mL/min >60  Glucose Latest Ref Range: 65 - 99 mg/dL 150 (H)  Anion gap Latest Ref Range: 5 - 15  6  Alkaline Phosphatase Latest Ref Range: 38 - 126 U/L 65  Albumin Latest Ref Range: 3.5 - 5.0 g/dL 3.4 (L)  AST Latest Ref Range: 15 - 41 U/L 20  ALT Latest Ref Range: 17 - 63 U/L 16 (L)  Total Protein Latest Ref Range: 6.5 - 8.1 g/dL 6.7  Total Bilirubin Latest Ref Range: 0.3 - 1.2 mg/dL 0.3  WBC Latest Ref Range: 4.0 - 10.5 K/uL 11.6 (H)  RBC Latest Ref Range: 4.22 - 5.81 MIL/uL 2.94 (L)  Hemoglobin Latest Ref Range: 13.0 - 17.0 g/dL 9.9 (L)  HCT Latest Ref Range: 39.0 - 52.0 % 30.9 (L)  MCV Latest Ref Range: 78.0 - 100.0 fL 105.1 (H)  MCH Latest Ref Range: 26.0 - 34.0 pg 33.7  MCHC Latest Ref Range: 30.0 - 36.0 g/dL 32.0  RDW Latest Ref Range: 11.5 - 15.5 % 15.3  Platelets Latest Ref Range: 150 - 400 K/uL 187  Neutrophils Latest Units: % 73  Lymphocytes Latest Units: % 12  Monocytes Relative Latest Units: % 13  Eosinophil Latest Units: % 2  Basophil Latest Units: % 0  NEUT#  Latest Ref Range: 1.7 - 7.7 K/uL 8.4 (H)  Lymphocyte # Latest Ref Range: 0.7 - 4.0 K/uL 1.4  Monocyte # Latest Ref Range: 0.1 - 1.0 K/uL 1.5 (H)  Eosinophils Absolute Latest Ref Range: 0.0 - 0.7 K/uL 0.3  Basophils Absolute Latest Ref Range: 0.0 - 0.1 K/uL 0.0  CA 19-9 Latest Ref Range: 0 - 35 U/mL 40 (H)     RADIOGRAPHY:  Study Result   CLINICAL DATA:  82 year old male with history of pancreatic mass. Follow-up study.  EXAM: CT ABDOMEN WITHOUT AND WITH CONTRAST  TECHNIQUE: Multidetector CT imaging of the abdomen was performed following the standard protocol before and following the bolus administration of intravenous contrast.  CONTRAST:  174m ISOVUE-300 IOPAMIDOL (ISOVUE-300) INJECTION 61%  COMPARISON:  CT the abdomen 08/20/2015.  FINDINGS: Lower chest: Scarring in the lung bases bilaterally (left greater than right). Calcifications of the aortic valve. Atherosclerotic calcifications throughout the right coronary artery.  Hepatobiliary: Multiple sub cm low-attenuation lesions are again noted throughout the liver, too small to characterize, but similar in size, number and pattern of distribution compared to prior examinations, favored to represent benign lesions such as cysts or small biliary hamartomas. No intra or extrahepatic biliary ductal dilatation. Gallbladder is normal in appearance.  Pancreas: The lesion in the head of the pancreas noted on prior examinations appears slightly larger, currently measuring 2.4 x 2.2 x 2.9 cm (axial image 49 of series 3 and coronal image 45 of series 6). This lesion does not make contact with major arteries or veins around the head of the pancreas. No other pancreatic lesions are noted. No pancreatic or peripancreatic inflammatory changes. No definite pancreatic ductal dilatation.  Spleen: Small splenule anterior  to the inferior aspect of the spleen. Otherwise, unremarkable.  Adrenals/Urinary Tract: Mild atrophy of  the kidneys bilaterally. 1.5 cm exophytic lesion in the upper pole the right kidney is compatible with a simple cyst. 1 cm low-attenuation (-12 HU) left adrenal nodule is unchanged, compatible with an adenoma. Left kidney and right adrenal gland are normal in appearance.  Stomach/Bowel: The appearance of the stomach is normal. Postoperative changes of right hemicolectomy are noted. No pathologic dilatation of visualized portions of small bowel or colon.  Vascular/Lymphatic: Aortic atherosclerosis, without definite aneurysm or dissection in the abdominal vasculature. No lymphadenopathy noted in the abdomen.  Other: No significant volume of ascites noted in the visualized portions of the peritoneal cavity. No pneumoperitoneum.  Musculoskeletal: There are no aggressive appearing lytic or blastic lesions noted in the visualized portions of the skeleton.  IMPRESSION: 1. Slight interval enlargement of a cystic appearing lesion in the head of the pancreas, which again is not associated with pancreatic ductal dilatation or biliary tract ductal dilatation. The growth of this lesion is concerning for potential side branch intraductal papillary mucinous neoplasm (IPMN). No definite mural nodularity or enhancement identified at this time. Further evaluation with ERCP for endoscopic ultrasound and potential biopsy should be considered. This recommendation follows ACR consensus guidelines: Management of Incidental Pancreatic Cysts: A White Paper of the ACR Incidental Findings Committee. J Am Coll Radiol 6578;46:962-952. 2. There are calcifications of the aortic valve. Echocardiographic correlation for evaluation of potential valvular dysfunction may be warranted if clinically indicated. 3. Aortic atherosclerosis, in addition to at least right coronary artery disease. 4. Additional incidental findings, as above.   Electronically Signed   By: Vinnie Langton M.D.   On: 11/21/2015  17:41      PATHOLOGY:       ASSESSMENT/PLAN:   Pancreatic mass We reviewed CT imaging in detail which shows the lesion to be persistent and slightly larger. IPMN's can progress to pancreatic carcinoma. The issue is realistically what to do for Mr. Dapolito given his multiple co-morbidities.  I will discuss with Dr. Ardis Hughs and then with the patient's sons. Ongoing follow-up may not be unrealistic. I do not feel that he could tolerate surgery.  CA 19-9 was mildly elevated and not unexpected even with a cystic lesion. CA 19-9 is stable today.   Lesion measures approximately 1.6 x 1.7 x 2.3 cm on today's study, comparing to 3.1 x 1.7 x 2.4 cm previously   Anemia Labs reviewed with family. Stable, no further evaluation currently. If counts worsen will discuss with family further workup.   Leukocytosis Chronic leukocytosis dates back to at least 2014.  Caregiver will be notified of any future appointments or procedures.  Follow up tentatively in 3 months.   Orders Placed This Encounter  Procedures  . CBC with Differential    Standing Status:   Future    Standing Expiration Date:   11/26/2016  . Comprehensive metabolic panel    Standing Status:   Future    Standing Expiration Date:   11/26/2016  . Cancer antigen 19-9    Standing Status:   Future    Standing Expiration Date:   11/26/2016   All questions were answered. The patient knows to call the clinic with any problems, questions or concerns. We can certainly see the patient much sooner if necessary.  This document serves as a record of services personally performed by Ancil Linsey, MD. It was created on her behalf by Elmyra Ricks, a trained medical scribe. The  creation of this record is based on the scribe's personal observations and the provider's statements to them. This document has been checked and approved by the attending provider.  I have reviewed the above documentation for accuracy and completeness and I agree with the  above.  This note is electronically signed by  Molli Hazard, MD  11/27/2015 1:12 PM

## 2015-11-28 ENCOUNTER — Encounter (HOSPITAL_COMMUNITY): Payer: Self-pay | Admitting: Hematology & Oncology

## 2015-11-28 LAB — CANCER ANTIGEN 19-9: CA 19-9: 40 U/mL — ABNORMAL HIGH (ref 0–35)

## 2015-12-27 ENCOUNTER — Ambulatory Visit (INDEPENDENT_AMBULATORY_CARE_PROVIDER_SITE_OTHER): Payer: Medicare Other | Admitting: Internal Medicine

## 2015-12-27 ENCOUNTER — Encounter: Payer: Self-pay | Admitting: Internal Medicine

## 2015-12-27 VITALS — BP 128/58 | HR 78 | Ht 69.0 in | Wt 201.0 lb

## 2015-12-27 DIAGNOSIS — I428 Other cardiomyopathies: Secondary | ICD-10-CM | POA: Diagnosis not present

## 2015-12-27 NOTE — Patient Instructions (Signed)
Your physician wants you to follow-up in: 1 Year with Dr. Taylor. You will receive a reminder letter in the mail two months in advance. If you don't receive a letter, please call our office to schedule the follow-up appointment.  Your physician recommends that you continue on your current medications as directed. Please refer to the Current Medication list given to you today.  If you need a refill on your cardiac medications before your next appointment, please call your pharmacy.  Thank you for choosing [facility]!    

## 2015-12-27 NOTE — Progress Notes (Signed)
HPI [name] returns today for followup. He is a very pleasant 82 year old man with a history of coronary artery disease, status post stenting in the past. He has dyslipidemia, and hypertension. He has diastolic heart failure. He is diabetic. Despite his multiple problems, he is stable from a cardiovascular perspective.  He denies chest pain, shortness of breath, or peripheral edema. He is bothered by arthritis and is now mostly in a wheelchair but he can transfer. No syncope. He denies fevers or chills. He has a pancreatic mass but no clear malignancy diagnosis. No Known Allergies   Current Outpatient Prescriptions  Medication Sig Dispense Refill  . acetaminophen (TYLENOL) 325 MG tablet Take 650 mg by mouth every 6 (six) hours as needed.    Marland Kitchen albuterol (PROVENTIL) (2.5 MG/3ML) 0.083% nebulizer solution Take 2.5 mg by nebulization every 6 (six) hours.    . budesonide-formoterol (SYMBICORT) 160-4.5 MCG/ACT inhaler Inhale 2 puffs into the lungs 2 (two) times daily.    . carvedilol (COREG) 3.125 MG tablet Take 3.125 mg by mouth 2 (two) times daily with a meal.     . Cinnamon 500 MG TABS Take 500 mg by mouth daily.    . clopidogrel (PLAVIX) 75 MG tablet Take 1 tablet (75 mg total) by mouth daily. 90 tablet 3  . dexlansoprazole (DEXILANT) 60 MG capsule Take 60 mg by mouth daily.    . DULoxetine (CYMBALTA) 60 MG capsule Take 60 mg by mouth daily.    . ferrous sulfate 325 (65 FE) MG EC tablet Take 325 mg by mouth 3 (three) times daily with meals.    . gabapentin (NEURONTIN) 100 MG capsule Take 100 mg by mouth at bedtime.    Marland Kitchen guaiFENesin (MUCINEX) 600 MG 12 hr tablet Take 600 mg by mouth 2 (two) times daily.    Marland Kitchen HYDROcodone-acetaminophen (NORCO/VICODIN) 5-325 MG tablet Take 1 tablet by mouth every 8 (eight) hours.    . insulin aspart (NOVOLOG) 100 UNIT/ML injection Inject 5 Units into the skin 3 (three) times daily before meals.    . insulin glargine (LANTUS) 100 UNIT/ML injection Inject 25 Units  into the skin at bedtime.     Marland Kitchen ipratropium-albuterol (DUONEB) 0.5-2.5 (3) MG/3ML SOLN Take 3 mLs by nebulization every 6 (six) hours as needed (wheezing/shortness of breathe.).    Marland Kitchen levothyroxine (SYNTHROID, LEVOTHROID) 50 MCG tablet Take 50 mcg by mouth daily before breakfast.    . lisinopril (PRINIVIL,ZESTRIL) 2.5 MG tablet Take 2.5 mg by mouth daily.    Marland Kitchen LORazepam (ATIVAN) 0.5 MG tablet Take 0.5 mg by mouth daily.     . meclizine (ANTIVERT) 12.5 MG tablet Take 12.5 mg by mouth 2 (two) times daily.     . OXYGEN Inhale 2 L into the lungs continuous.    Vladimir Faster Glycol-Propyl Glycol (SYSTANE) 0.4-0.3 % SOLN Apply 1 drop to eye.    . polyethylene glycol (MIRALAX / GLYCOLAX) packet Take 17 g by mouth daily.    . potassium chloride SA (K-DUR,KLOR-CON) 20 MEQ tablet Take 20 mEq by mouth daily.    Marland Kitchen Propylene Glycol (SYSTANE BALANCE) 0.6 % SOLN Apply 1 drop to eye 3 (three) times daily.     . simvastatin (ZOCOR) 20 MG tablet Take 20 mg by mouth daily.    . temazepam (RESTORIL) 7.5 MG capsule Take 7.5 mg by mouth at bedtime as needed for sleep.    Marland Kitchen tiotropium (SPIRIVA) 18 MCG inhalation capsule Place 18 mcg into inhaler and inhale daily.    Marland Kitchen  torsemide (DEMADEX) 20 MG tablet Take 2 tablets (40 mg total) by mouth daily. 60 tablet 6   No current facility-administered medications for this visit.      Past Medical History:  Diagnosis Date  . Adenocarcinoma ([place]) 2003   COLON  . Anemia   . Angina   . Anxiety   . Asthma   . CHF (congestive heart failure) (H[facility])    chronic diastolic  . COPD (chronic obstructive pulmonary disease) ([place])   . Coronary artery stenosis    a. 06/2011 Cath: LM 20, LAD min irregs, D1 95 - small,  LCX 60p, RCA 34m, 50d, PLA 50ost;  b. 06/2011 PCI/Rota  RCA  -> 3.0x62mm Promus Element DES  . Diverticula of colon   . Dysphagia   . Esophageal dysmotility   . GERD (gastroesophageal reflux disease)   . H. pylori infection 03/01/12   treated with prevpac  . Hiatal hernia  08/05/10   egd with Dr. Gala Romney  . Hyperlipidemia   . Hyperplastic polyp of intestine   . Hypertension   . Hypothyroidism   . Internal hemorrhoids   . Ischemic cardiomyopathy    a. 05/2011 Echo: EF 45-50%, inf/post HK  . Kidney stone   . Microscopic colitis   . Mobility impaired    wheelchair dependent  . On supplemental oxygen therapy    oxygen @ 2 l/m nasally  . Osteoarthritis    OF KNEES  . Pancreatic mass 05/28/2015   neoplasm of pancreas  . Pneumonia ~ 2005; 09/2005  . PVD (peripheral vascular disease) ([place])    ABI 0.78 RIGHT AND 0.80 LEFT 4/11  . Schatzki's ring 08/05/10   egd with Dr. Gala Romney  . Shortness of breath 07/17/11   "laying down"  . Tubular adenoma   . Type II diabetes mellitus (H[facility])     ROS:   All systems reviewed and negative except as noted in the HPI.   Past Surgical History:  Procedure Laterality Date  . APPENDECTOMY    . ATHERECTOMY  07/17/11  . CATARACT EXTRACTION, BILATERAL    . COLON SURGERY    . COLONOSCOPY  04/2001   Dr. Laural Golden- colon carcinoma  . COLONOSCOPY  09/04/09   Dr. Matthew Folks, hemorrhoids  . COLONOSCOPY  03/11/2012   Dr. Sabino Gasser R hemicolectomy, pancolonic diverticulosis, tubular adenoma, hyperplastic polyp  . CORONARY ANGIOPLASTY  07/17/11   rotablator  . ESOPHAGOGASTRODUODENOSCOPY  04/2001   Dr. Laural Golden  . ESOPHAGOGASTRODUODENOSCOPY  08/05/10   Dr. Evalee Mutton ring, hiatal hernia  . ESOPHAGOGASTRODUODENOSCOPY  03/01/2012   Dr. Gala Romney- gastric erosions, small hiatal hernia, +hpylori,=treated with prevpac  . ESOPHAGOGASTRODUODENOSCOPY N/A 08/11/2013   [id number] Schatzki's ring- s/p dilation as described above. Some retained gastric contents-query delay in gastric emptying. Abnormal gastric mucosa- s/p gastric biopsy (chronic gastritis, intestional metaplasia, no definite h.pylori)  . EUS N/A 06/14/2015   Procedure: UPPER ENDOSCOPIC ULTRASOUND (EUS) LINEAR;  Surgeon: Milus Banister, MD;  Location: [facility] ENDOSCOPY;  Service: Endoscopy;   Laterality: N/A;  . HEMICOLECTOMY  2003  . MALONEY DILATION N/A 08/11/2013   Procedure: Venia Minks DILATION;  Surgeon: Daneil Dolin, MD;  Location: [facility] ENDO [facility];  Service: Endoscopy;  Laterality: N/A;  . PERCUTANEOUS CORONARY STENT INTERVENTION (PCI-S) N/A 07/17/2011   Procedure: PERCUTANEOUS CORONARY STENT INTERVENTION (PCI-S);  Surgeon: Burnell Blanks, MD;  Location: [facility] CATH LAB;  Service: Cardiovascular;  Laterality: N/A;  . SAVORY DILATION N/A 08/11/2013   Procedure: SAVORY DILATION;  Surgeon: Daneil Dolin, MD;  Location: [facility]  ENDO SUITE;  Service: Endoscopy;  Laterality: N/A;     Family History  Problem Relation Age of Onset  . Coronary artery disease Father   . Heart Problems Mother   . Coronary artery disease Brother     CABG  . Diabetes Brother   . Coronary artery disease Brother      Social History   Social History  . Marital status: Married    Spouse name: N/A  . Number of children: N/A  . Years of education: N/A   Occupational History  . Not on file.   Social History Main Topics  . Smoking status: Former Smoker    Years: 20.00    Types: Cigarettes    Quit date: 02/25/2008  . Smokeless tobacco: Never Used  . Alcohol use No  . Drug use: No  . Sexual activity: Not Currently   Other Topics Concern  . Not on file   Social History Narrative   Lives in [place]. Married. Wife in [place].   Son lives with him   Caffeine Use-     BP (!) 128/58   Pulse 78   Ht 5\' 9"  (1.753 m)   Wt 201 lb (91.2 kg)   SpO2 95%   BMI 29.68 kg/m   Physical Exam:  stable appearing elderly man, NAD HEENT: Unremarkable Neck:  7 cm JVD,diskempt appearing, no thyromegally Lungs:  Clear with no wheezes, rales, or rhonchi. HEART:  Regular rate rhythm, no murmurs, no rubs, no clicks, heart sounds are distant. Abd:  soft, soft, obese,positive bowel sounds, no organomegally, no rebound, no guarding Ext:  2 plus pulses, no edema, no cyanosis, no clubbing Skin:  No rashes no  nodules Neuro:  CN II through XII intact, motor grossly intact, he confabulates and has flight of ideas with many non-sensical statements  ECG - NSR with PVC's  Assess/Plan: 1. CAD - he denies anginal symptoms. Continue his current meds. 2. HTN - his blood pressure is controlled. Continue current meds 3. Obesity - he is losing weight.  Will see him back in a year.

## 2016-02-27 ENCOUNTER — Encounter (HOSPITAL_COMMUNITY): Payer: Medicare Other | Attending: Hematology & Oncology | Admitting: Hematology & Oncology

## 2016-02-27 ENCOUNTER — Encounter (HOSPITAL_COMMUNITY): Payer: Medicare Other

## 2016-02-27 ENCOUNTER — Encounter (HOSPITAL_COMMUNITY): Payer: Self-pay | Admitting: Hematology & Oncology

## 2016-02-27 DIAGNOSIS — Z87442 Personal history of urinary calculi: Secondary | ICD-10-CM | POA: Diagnosis not present

## 2016-02-27 DIAGNOSIS — Z955 Presence of coronary angioplasty implant and graft: Secondary | ICD-10-CM | POA: Diagnosis not present

## 2016-02-27 DIAGNOSIS — Z85038 Personal history of other malignant neoplasm of large intestine: Secondary | ICD-10-CM | POA: Insufficient documentation

## 2016-02-27 DIAGNOSIS — D649 Anemia, unspecified: Secondary | ICD-10-CM | POA: Insufficient documentation

## 2016-02-27 DIAGNOSIS — Z9981 Dependence on supplemental oxygen: Secondary | ICD-10-CM | POA: Insufficient documentation

## 2016-02-27 DIAGNOSIS — E1151 Type 2 diabetes mellitus with diabetic peripheral angiopathy without gangrene: Secondary | ICD-10-CM | POA: Diagnosis not present

## 2016-02-27 DIAGNOSIS — I2510 Atherosclerotic heart disease of native coronary artery without angina pectoris: Secondary | ICD-10-CM | POA: Diagnosis not present

## 2016-02-27 DIAGNOSIS — F419 Anxiety disorder, unspecified: Secondary | ICD-10-CM | POA: Diagnosis not present

## 2016-02-27 DIAGNOSIS — I255 Ischemic cardiomyopathy: Secondary | ICD-10-CM | POA: Diagnosis not present

## 2016-02-27 DIAGNOSIS — F0390 Unspecified dementia without behavioral disturbance: Secondary | ICD-10-CM | POA: Diagnosis not present

## 2016-02-27 DIAGNOSIS — Z7902 Long term (current) use of antithrombotics/antiplatelets: Secondary | ICD-10-CM | POA: Insufficient documentation

## 2016-02-27 DIAGNOSIS — Z87891 Personal history of nicotine dependence: Secondary | ICD-10-CM | POA: Insufficient documentation

## 2016-02-27 DIAGNOSIS — I110 Hypertensive heart disease with heart failure: Secondary | ICD-10-CM | POA: Insufficient documentation

## 2016-02-27 DIAGNOSIS — Z993 Dependence on wheelchair: Secondary | ICD-10-CM | POA: Insufficient documentation

## 2016-02-27 DIAGNOSIS — D7589 Other specified diseases of blood and blood-forming organs: Secondary | ICD-10-CM | POA: Diagnosis not present

## 2016-02-27 DIAGNOSIS — I739 Peripheral vascular disease, unspecified: Secondary | ICD-10-CM | POA: Diagnosis not present

## 2016-02-27 DIAGNOSIS — M1990 Unspecified osteoarthritis, unspecified site: Secondary | ICD-10-CM | POA: Diagnosis not present

## 2016-02-27 DIAGNOSIS — K219 Gastro-esophageal reflux disease without esophagitis: Secondary | ICD-10-CM | POA: Diagnosis not present

## 2016-02-27 DIAGNOSIS — M4317 Spondylolisthesis, lumbosacral region: Secondary | ICD-10-CM | POA: Diagnosis not present

## 2016-02-27 DIAGNOSIS — E039 Hypothyroidism, unspecified: Secondary | ICD-10-CM | POA: Insufficient documentation

## 2016-02-27 DIAGNOSIS — K210 Gastro-esophageal reflux disease with esophagitis: Secondary | ICD-10-CM | POA: Diagnosis not present

## 2016-02-27 DIAGNOSIS — K8689 Other specified diseases of pancreas: Secondary | ICD-10-CM

## 2016-02-27 DIAGNOSIS — Z7189 Other specified counseling: Secondary | ICD-10-CM

## 2016-02-27 DIAGNOSIS — D72829 Elevated white blood cell count, unspecified: Secondary | ICD-10-CM | POA: Insufficient documentation

## 2016-02-27 DIAGNOSIS — I5030 Unspecified diastolic (congestive) heart failure: Secondary | ICD-10-CM | POA: Insufficient documentation

## 2016-02-27 DIAGNOSIS — Z7951 Long term (current) use of inhaled steroids: Secondary | ICD-10-CM | POA: Insufficient documentation

## 2016-02-27 DIAGNOSIS — J449 Chronic obstructive pulmonary disease, unspecified: Secondary | ICD-10-CM | POA: Diagnosis not present

## 2016-02-27 DIAGNOSIS — Z794 Long term (current) use of insulin: Secondary | ICD-10-CM | POA: Insufficient documentation

## 2016-02-27 DIAGNOSIS — E785 Hyperlipidemia, unspecified: Secondary | ICD-10-CM | POA: Insufficient documentation

## 2016-02-27 DIAGNOSIS — Z79899 Other long term (current) drug therapy: Secondary | ICD-10-CM | POA: Diagnosis not present

## 2016-02-27 DIAGNOSIS — Z833 Family history of diabetes mellitus: Secondary | ICD-10-CM | POA: Insufficient documentation

## 2016-02-27 DIAGNOSIS — K869 Disease of pancreas, unspecified: Secondary | ICD-10-CM

## 2016-02-27 DIAGNOSIS — Z8249 Family history of ischemic heart disease and other diseases of the circulatory system: Secondary | ICD-10-CM | POA: Insufficient documentation

## 2016-02-27 LAB — CBC WITH DIFFERENTIAL/PLATELET
Basophils Absolute: 0 10*3/uL (ref 0.0–0.1)
Basophils Relative: 0 %
EOS PCT: 2 %
Eosinophils Absolute: 0.3 10*3/uL (ref 0.0–0.7)
HCT: 32.8 % — ABNORMAL LOW (ref 39.0–52.0)
HEMOGLOBIN: 10.7 g/dL — AB (ref 13.0–17.0)
LYMPHS PCT: 9 %
Lymphs Abs: 1.2 10*3/uL (ref 0.7–4.0)
MCH: 35 pg — ABNORMAL HIGH (ref 26.0–34.0)
MCHC: 32.6 g/dL (ref 30.0–36.0)
MCV: 107.2 fL — AB (ref 78.0–100.0)
Monocytes Absolute: 1.4 10*3/uL — ABNORMAL HIGH (ref 0.1–1.0)
Monocytes Relative: 11 %
Neutro Abs: 10.2 10*3/uL — ABNORMAL HIGH (ref 1.7–7.7)
Neutrophils Relative %: 78 %
PLATELETS: 203 10*3/uL (ref 150–400)
RBC: 3.06 MIL/uL — AB (ref 4.22–5.81)
RDW: 15 % (ref 11.5–15.5)
WBC: 13.1 10*3/uL — AB (ref 4.0–10.5)

## 2016-02-27 LAB — COMPREHENSIVE METABOLIC PANEL
ALK PHOS: 59 U/L (ref 38–126)
ALT: 35 U/L (ref 17–63)
AST: 27 U/L (ref 15–41)
Albumin: 3.5 g/dL (ref 3.5–5.0)
Anion gap: 6 (ref 5–15)
BILIRUBIN TOTAL: 0.4 mg/dL (ref 0.3–1.2)
BUN: 40 mg/dL — ABNORMAL HIGH (ref 6–20)
CALCIUM: 8.8 mg/dL — AB (ref 8.9–10.3)
CO2: 31 mmol/L (ref 22–32)
CREATININE: 1.4 mg/dL — AB (ref 0.61–1.24)
Chloride: 99 mmol/L — ABNORMAL LOW (ref 101–111)
GFR, EST AFRICAN AMERICAN: 52 mL/min — AB (ref >60)
GFR, EST NON AFRICAN AMERICAN: 45 mL/min — AB (ref >60)
Glucose, Bld: 230 mg/dL — ABNORMAL HIGH (ref 65–99)
Potassium: 4.6 mmol/L (ref 3.5–5.1)
Sodium: 136 mmol/L (ref 135–145)
Total Protein: 6.4 g/dL — ABNORMAL LOW (ref 6.5–8.1)

## 2016-02-27 NOTE — Progress Notes (Signed)
[facility] Hematology/Oncology Progress Note  Name: Howard [name]      MRN: [id number]   Date: 03/02/2016 Time:9:57 PM   REFERRING PHYSICIAN:  Victorino December, NP  REASON FOR CONSULT:  Pancreatic mass   DIAGNOSIS:   3.1 x 1.7 x 2.4 cm (low-density) mass in the pancreatic head, concerning for primary pancreatic carcinoma. Pancreatic Cyst Endoscopic Ultrasound 06/14/2015 with Dr. Ardis Hughs cystic lesion in pancreatic head, not malignant or pre-malignant appearing Aspirate with atypical cells favoring reactive mesothelial cells CT scans 08/20/2015 persistent but slightly less conspicuous hypo-attenuating lesion identified in the head of the pancreas, could be cystic abnormality  HISTORY OF PRESENT ILLNESS:   Howard [name] is a 83 year old white man with a past medical history significant for CAD (extensive), PVD/PAD (extensive), chronic spondylolisthesis at L5-S1, chronic respiratory failure, diastolic CHF, GERD, hyperlipidemia, ischemic cardiomyopathy, type II DM who presents for ongoing follow-up of a  pancreatic mass. Aspiration of this mass shoed atypical cells favoring reactive mesothelial cells, no malignancy noted.   Patient has dementia and is accompanied by his son. He presents in a wheelchair and is using a nasal cannula.   Denies abdominal pain. Eating well. No complaints. His son would like another CT scan.   His hands have been bluish purple. He has been bruising easily. His son notes that this is nothing new.   He sees Dr. Luan Pulling and Dr. Gala Romney next week. He continues to reside in a local NH.   PAST MEDICAL HISTORY:   Past Medical History:  Diagnosis Date  . Adenocarcinoma ([place]) 2003   COLON  . Anemia   . Angina   . Anxiety   . Asthma   . CHF (congestive heart failure) (HCC)    chronic diastolic  . COPD (chronic obstructive pulmonary disease) ([place])   . Coronary artery stenosis    a. 06/2011 Cath: LM 20, LAD min irregs, D1 95 - small,  LCX 60p, RCA 38m  50d, PLA 50ost;  b. 06/2011 PCI/Rota  RCA  -> 3.0x227mPromus Element DES  . Diverticula of colon   . Dysphagia   . Esophageal dysmotility   . GERD (gastroesophageal reflux disease)   . H. pylori infection 03/01/12   treated with prevpac  . Hiatal hernia 08/05/10   egd with Dr. RoGala Romney. Hyperlipidemia   . Hyperplastic polyp of intestine   . Hypertension   . Hypothyroidism   . Internal hemorrhoids   . Ischemic cardiomyopathy    a. 05/2011 Echo: EF 45-50%, inf/post HK  . Kidney stone   . Microscopic colitis   . Mobility impaired    wheelchair dependent  . On supplemental oxygen therapy    oxygen @ 2 l/m nasally  . Osteoarthritis    OF KNEES  . Pancreatic mass 05/28/2015   neoplasm of pancreas  . Pneumonia ~ 2005; 09/2005  . PVD (peripheral vascular disease) ([place]   ABI 0.78 RIGHT AND 0.80 LEFT 4/11  . Schatzki's ring 08/05/10   egd with Dr. RoGala Romney. Shortness of breath 07/17/11   "laying down"  . Tubular adenoma   . Type II diabetes mellitus (HCC)     ALLERGIES: No Known Allergies    MEDICATIONS: I have reviewed the patient's current medications.    Current Outpatient Prescriptions on File Prior to Visit  Medication Sig Dispense Refill  . acetaminophen (TYLENOL) 325 MG tablet Take 650 mg by mouth every 6 (six) hours as needed.    .Marland Kitchen  albuterol (PROVENTIL) (2.5 MG/3ML) 0.083% nebulizer solution Take 2.5 mg by nebulization every 6 (six) hours.    . budesonide-formoterol (SYMBICORT) 160-4.5 MCG/ACT inhaler Inhale 2 puffs into the lungs 2 (two) times daily.    . carvedilol (COREG) 3.125 MG tablet Take 3.125 mg by mouth 2 (two) times daily with a meal.     . Cinnamon 500 MG TABS Take 500 mg by mouth daily.    . clopidogrel (PLAVIX) 75 MG tablet Take 1 tablet (75 mg total) by mouth daily. 90 tablet 3  . dexlansoprazole (DEXILANT) 60 MG capsule Take 60 mg by mouth daily.    . DULoxetine (CYMBALTA) 60 MG capsule Take 60 mg by mouth daily.    Marland Kitchen gabapentin (NEURONTIN) 100 MG capsule Take  100 mg by mouth at bedtime.    Marland Kitchen guaiFENesin (MUCINEX) 600 MG 12 hr tablet Take 600 mg by mouth 2 (two) times daily.    Marland Kitchen HYDROcodone-acetaminophen (NORCO/VICODIN) 5-325 MG tablet Take 1 tablet by mouth every 8 (eight) hours.    . insulin aspart (NOVOLOG) 100 UNIT/ML injection Inject 5 Units into the skin 3 (three) times daily before meals.    . insulin glargine (LANTUS) 100 UNIT/ML injection Inject 25 Units into the skin at bedtime.     Marland Kitchen ipratropium-albuterol (DUONEB) 0.5-2.5 (3) MG/3ML SOLN Take 3 mLs by nebulization every 6 (six) hours as needed (wheezing/shortness of breathe.).    Marland Kitchen levothyroxine (SYNTHROID, LEVOTHROID) 50 MCG tablet Take 50 mcg by mouth daily before breakfast.    . lisinopril (PRINIVIL,ZESTRIL) 2.5 MG tablet Take 2.5 mg by mouth daily.    Marland Kitchen LORazepam (ATIVAN) 0.5 MG tablet Take 0.5 mg by mouth daily.     . meclizine (ANTIVERT) 12.5 MG tablet Take 12.5 mg by mouth 2 (two) times daily.     . OXYGEN Inhale 2 L into the lungs continuous.    Vladimir Faster Glycol-Propyl Glycol (SYSTANE) 0.4-0.3 % SOLN Apply 1 drop to eye.    . polyethylene glycol (MIRALAX / GLYCOLAX) packet Take 17 g by mouth daily.    . potassium chloride SA (K-DUR,KLOR-CON) 20 MEQ tablet Take 20 mEq by mouth daily.    Marland Kitchen Propylene Glycol (SYSTANE BALANCE) 0.6 % SOLN Apply 1 drop to eye 3 (three) times daily.     . simvastatin (ZOCOR) 20 MG tablet Take 20 mg by mouth daily.    . temazepam (RESTORIL) 7.5 MG capsule Take 7.5 mg by mouth at bedtime as needed for sleep.    Marland Kitchen tiotropium (SPIRIVA) 18 MCG inhalation capsule Place 18 mcg into inhaler and inhale daily.    Marland Kitchen torsemide (DEMADEX) 20 MG tablet Take 2 tablets (40 mg total) by mouth daily. 60 tablet 6   No current facility-administered medications on file prior to visit.      PAST SURGICAL HISTORY Past Surgical History:  Procedure Laterality Date  . APPENDECTOMY    . ATHERECTOMY  07/17/11  . CATARACT EXTRACTION, BILATERAL    . COLON SURGERY    .  COLONOSCOPY  04/2001   Dr. Laural Golden- colon carcinoma  . COLONOSCOPY  09/04/09   Dr. Matthew Folks, hemorrhoids  . COLONOSCOPY  03/11/2012   Dr. Sabino Gasser R hemicolectomy, pancolonic diverticulosis, tubular adenoma, hyperplastic polyp  . CORONARY ANGIOPLASTY  07/17/11   rotablator  . ESOPHAGOGASTRODUODENOSCOPY  04/2001   Dr. Laural Golden  . ESOPHAGOGASTRODUODENOSCOPY  08/05/10   Dr. Evalee Mutton ring, hiatal hernia  . ESOPHAGOGASTRODUODENOSCOPY  03/01/2012   Dr. Gala Romney- gastric erosions, small hiatal hernia, +hpylori,=treated with prevpac  .  ESOPHAGOGASTRODUODENOSCOPY N/A 08/11/2013   YNW:GNFAOZ Schatzki's ring- s/p dilation as described above. Some retained gastric contents-query delay in gastric emptying. Abnormal gastric mucosa- s/p gastric biopsy (chronic gastritis, intestional metaplasia, no definite h.pylori)  . EUS N/A 06/14/2015   Procedure: UPPER ENDOSCOPIC ULTRASOUND (EUS) LINEAR;  Surgeon: Milus Banister, MD;  Location: [facility] ENDOSCOPY;  Service: Endoscopy;  Laterality: N/A;  . HEMICOLECTOMY  2003  . MALONEY DILATION N/A 08/11/2013   Procedure: Venia Minks DILATION;  Surgeon: Daneil Dolin, MD;  Location: [facility] END[facility] SUITE;  Service: Endoscopy;  Laterality: N/A;  . PERCUTANEOUS CORONARY STENT INTERVENTION (PCI-S) N/A 07/17/2011   Procedure: PERCUTANEOUS CORONARY STENT INTERVENTION (PCI-S);  Surgeon: Burnell Blanks, MD;  Location[facility] LAB;  Service: Cardiovascular;  Laterality: N/A;  . SAVORY DILATION N/A 08/11/2013   Procedure: SAVORY DILATION;  Surgeon: Daneil Dolin, MD;  Location: [facility] END[facility] SU[facility];  Service: Endoscopy;  Laterality: N/A;    FAMILY HISTORY: Family History  Problem Relation Age of Onset  . Coronary artery disease Father   . Heart Problems Mother   . Coronary artery disease Brother     CABG  . Diabetes Brother   . Coronary artery disease Brother   Mother is deceased in her 38 from "old age." Father is deceased at 12 yo from "old age." He has 7 siblings in total, only  1 sister living.   He has 2 sons: 20yr old with HTN and gouth   89yo with stroke.  SOCIAL HISTORY: Not likely accurate, but, patient quit smoking 50 years ago after smoking 1/4 ppd x 10 years (son seems confused by his father's response), he denies any EtOH abuse, and illicit drug abuse.  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  Review of Systems  Constitutional: Negative.   HENT: Negative.   Eyes: Negative.   Respiratory: Negative.   Cardiovascular: Negative.   Gastrointestinal: Negative.  Negative for abdominal pain.  Genitourinary: Negative.   Musculoskeletal: Negative.   Skin: Negative.   Neurological: Negative.   Endo/Heme/Allergies: Bruises/bleeds easily.  Psychiatric/Behavioral: Positive for memory loss (dementia).  All other systems reviewed and are negative. 14 point review of systems was performed and is negative except as detailed under history of present illness and above   PERFORMANCE STATUS: The patient's performance status is 3 - Symptomatic, >50% confined to bed  PHYSICAL EXAM: Most Recent Vital Signs: There were no vitals taken for this visit.    Physical Exam  Constitutional: He is well-developed, well-nourished, and in no distress.  Exam done from wheelchair.   HENT:  Head: Normocephalic and atraumatic.  Mouth/Throat: Oropharynx is clear and moist.  Eyes: Conjunctivae and EOM are normal. Pupils are equal, round, and reactive to light. No scleral icterus.  Neck: Normal range of motion. Neck supple.  Cardiovascular: Normal rate, regular rhythm and normal heart sounds.   Pulmonary/Chest: Effort normal and breath sounds normal.  Abdominal: Soft. Bowel sounds are normal. He exhibits no distension and no mass. There is no tenderness. There is no rebound and no guarding.  Musculoskeletal: Normal range of motion.  Lymphadenopathy:    He has no cervical adenopathy.  Neurological: He  is alert.  Clearly confused at times, pleasant  Skin: Skin is warm and dry.  Psychiatric: Mood normal.  Nursing note and vitals reviewed.   LABORATORY DATA:  I have reviewed the data as listed. Results for  KELLAN, RAFFIELD (MRN [id number]) as of 02/27/2016 14:45  Ref. Range 02/27/2016 13:18  Anion gap Latest Ref Range: 5 - 15  6  BUN Latest Ref Range: 6 - 20 mg/dL 40 (H)  Calcium Latest Ref Range: 8.9 - 10.3 mg/dL 8.8 (L)  Chloride Latest Ref Range: 101 - 111 mmol/L 99 (L)  CO2 Latest Ref Range: 22 - 32 mmol/L 31  Creatinine Latest Ref Range: 0.61 - 1.24 mg/dL 1.40 (H)  EGFR (Non-African Amer.) Latest Ref Range: >60 mL/min 45 (L)  EGFR (African American) Latest Ref Range: >60 mL/min 52 (L)  Glucose Latest Ref Range: 65 - 99 mg/dL 230 (H)  Alkaline Phosphatase Latest Ref Range: 38 - 126 U/L 59  Albumin Latest Ref Range: 3.5 - 5.0 g/dL 3.5  AST Latest Ref Range: 15 - 41 U/L 27  ALT Latest Ref Range: 17 - 63 U/L 35  Total Protein Latest Ref Range: 6.5 - 8.1 g/dL 6.4 (L)  Total Bilirubin Latest Ref Range: 0.3 - 1.2 mg/dL 0.4  Potassium Latest Ref Range: 3.5 - 5.1 mmol/L 4.6  Sodium Latest Ref Range: 135 - 145 mmol/L 136  WBC Latest Ref Range: 4.0 - 10.5 K/uL 13.1 (H)  RBC Latest Ref Range: 4.22 - 5.81 MIL/uL 3.06 (L)  Hemoglobin Latest Ref Range: 13.0 - 17.0 g/dL 10.7 (L)  HCT Latest Ref Range: 39.0 - 52.0 % 32.8 (L)  MCV Latest Ref Range: 78.0 - 100.0 fL 107.2 (H)  MCH Latest Ref Range: 26.0 - 34.0 pg 35.0 (H)  MCHC Latest Ref Range: 30.0 - 36.0 g/dL 32.6  RDW Latest Ref Range: 11.5 - 15.5 % 15.0  Platelets Latest Ref Range: 150 - 400 K/uL 203  Neutrophils Latest Units: % 78  Lymphocytes Latest Units: % 9  Monocytes Relative Latest Units: % 11  Eosinophil Latest Units: % 2  Basophil Latest Units: % 0  NEUT# Latest Ref Range: 1.7 - 7.7 K/uL 10.2 (H)  Lymphocyte # Latest Ref Range: 0.7 - 4.0 K/uL 1.2  Monocyte # Latest Ref Range: 0.1 - 1.0 K/uL 1.4 (H)  Eosinophils Absolute Latest  Ref Range: 0.0 - 0.7 K/uL 0.3  Basophils Absolute Latest Ref Range: 0.0 - 0.1 K/uL 0.0      RADIOGRAPHY:  Study Result   CLINICAL DATA:  83 year old male with history of pancreatic mass. Follow-up study.  EXAM: CT ABDOMEN WITHOUT AND WITH CONTRAST  TECHNIQUE: Multidetector CT imaging of the abdomen was performed following the standard protocol before and following the bolus administration of intravenous contrast.  CONTRAST:  173m ISOVUE-300 IOPAMIDOL (ISOVUE-300) INJECTION 61%  COMPARISON:  CT the abdomen 08/20/2015.  FINDINGS: Lower chest: Scarring in the lung bases bilaterally (left greater than right). Calcifications of the aortic valve. Atherosclerotic calcifications throughout the right coronary artery.  Hepatobiliary: Multiple sub cm low-attenuation lesions are again noted throughout the liver, too small to characterize, but similar in size, number and pattern of distribution compared to prior examinations, favored to represent benign lesions such as cysts or small biliary hamartomas. No intra or extrahepatic biliary ductal dilatation. Gallbladder is normal in appearance.  Pancreas: The lesion in the head of the pancreas noted on prior examinations appears slightly larger, currently measuring 2.4 x 2.2 x 2.9 cm (axial image 49 of series 3 and coronal image 45 of series 6). This lesion does not make contact with major arteries or veins around the head of the pancreas. No other pancreatic lesions are noted. No pancreatic or peripancreatic inflammatory changes. No definite pancreatic  ductal dilatation.  Spleen: Small splenule anterior to the inferior aspect of the spleen. Otherwise, unremarkable.  Adrenals/Urinary Tract: Mild atrophy of the kidneys bilaterally. 1.5 cm exophytic lesion in the upper pole the right kidney is compatible with a simple cyst. 1 cm low-attenuation (-12 HU) left adrenal nodule is unchanged, compatible with an adenoma. Left  kidney and right adrenal gland are normal in appearance.  Stomach/Bowel: The appearance of the stomach is normal. Postoperative changes of right hemicolectomy are noted. No pathologic dilatation of visualized portions of small bowel or colon.  Vascular/Lymphatic: Aortic atherosclerosis, without definite aneurysm or dissection in the abdominal vasculature. No lymphadenopathy noted in the abdomen.  Other: No significant volume of ascites noted in the visualized portions of the peritoneal cavity. No pneumoperitoneum.  Musculoskeletal: There are no aggressive appearing lytic or blastic lesions noted in the visualized portions of the skeleton.  IMPRESSION: 1. Slight interval enlargement of a cystic appearing lesion in the head of the pancreas, which again is not associated with pancreatic ductal dilatation or biliary tract ductal dilatation. The growth of this lesion is concerning for potential side branch intraductal papillary mucinous neoplasm (IPMN). No definite mural nodularity or enhancement identified at this time. Further evaluation with ERCP for endoscopic ultrasound and potential biopsy should be considered. This recommendation follows ACR consensus guidelines: Management of Incidental Pancreatic Cysts: A White Paper of the ACR Incidental Findings Committee. J Am Coll Radiol 5277;82:423-536. 2. There are calcifications of the aortic valve. Echocardiographic correlation for evaluation of potential valvular dysfunction may be warranted if clinically indicated. 3. Aortic atherosclerosis, in addition to at least right coronary artery disease. 4. Additional incidental findings, as above.   Electronically Signed   By: Vinnie Langton M.D.   On: 11/21/2015 17:41      PATHOLOGY:       ASSESSMENT/PLAN:   Pancreatic mass Prior CT imaging was reviewed. I again discussed with the patient's son that  IPMN's can progress to pancreatic carcinoma. The issue is  realistically what to do for Mr. Zapanta given his multiple co-morbidities. Ongoing follow-up may not be unrealistic. I do not feel that he could tolerate surgery.  CA 19-9 was mildly elevated and not unexpected even with a cystic lesion.  Anemia Labs reviewed with family. Stable, no further evaluation currently. If counts worsen will discuss with family further workup.   Macrocytosis  Normal B12 and folate in April. Will continue to monitor.   Leukocytosis Chronic leukocytosis dates back to at least 2014. Stable.   Repeat scans in 2 weeks. Will call with results.   He will return for a follow up in 3 months.   Orders Placed This Encounter  Procedures  . CT Abdomen W Contrast    Standing Status:   Future    Standing Expiration Date:   02/26/2017    Order Specific Question:   If indicated for the ordered procedure, I authorize the administration of contrast media per Radiology protocol    Answer:   Yes    Order Specific Question:   Reason for Exam (SYMPTOM  OR DIAGNOSIS REQUIRED)    Answer:   re-evaluate pancreatic mass    Order Specific Question:   Preferred imaging location?    Answer:   [place] antigen 19-9    Standing Status:   Future    Standing Expiration Date:   02/26/2017  . CBC with Differential    Standing Status:   Future    Standing Expiration Date:  02/26/2017  . Comprehensive metabolic panel    Standing Status:   Future    Standing Expiration Date:   02/26/2017   All questions were answered. The patient knows to call the clinic with any problems, questions or concerns. We can certainly see the patient much sooner if necessary.  This document serves as a record of services personally performed by Ancil Linsey, MD. It was created on her behalf by Martinique Casey, a trained medical scribe. The creation of this record is based on the scribe's personal observations and the provider's statements to them. This document has been checked and approved by the  attending provider.  I have reviewed the above documentation for accuracy and completeness and I agree with the above.  This note is electronically signed by  Molli Hazard, MD  03/02/2016 9:57 PM

## 2016-02-27 NOTE — Patient Instructions (Addendum)
[place] at [facility] Discharge Instructions  RECOMMENDATIONS MADE BY THE CONSULTANT AND ANY TEST RESULTS WILL BE SENT TO YOUR REFERRING PHYSICIAN.  You were seen today by Dr. Whitney Muse CT scan in 2 weeks Follow up in 3 months with labwork   Thank you for choosing [place] at [facility] to provide your oncology and hematology care.  To afford each patient quality time with our provider, please arrive at least 15 minutes before your scheduled appointment time.    If you have a lab appointment with the [facility] please come in thru the  Main Entrance and check in at the main information desk  You need to re-schedule your appointment should you arrive 10 or more minutes late.  We strive to give you quality time with our providers, and arriving late affects you and other patients whose appointments are after yours.  Also, if you no show three or more times for appointments you may be dismissed from the clinic at the providers discretion.     Again, thank you for choosing [facility].  Our hope is that these requests will decrease the amount of time that you wait before being seen by our physicians.       _____________________________________________________________  Should you have questions after your visit to [facility], please contact our office at [phone] between the hours of 8:30 a.m. and 4:30 p.m.  Voicemails left after 4:30 p.m. will not be returned until the following business day.  For prescription refill requests, have your pharmacy contact our office.       Resources For Cancer Patients and their Caregivers ? American Cancer Society: Can assist with transportation, wigs, general needs, runs Look Good Feel Better.        (406)794-7966 ? Cancer Care: Provides financial assistance, online support groups, medication/co-pay assistance.  1-800-813-HOPE 469-547-7546) ? [facility] Assists [place] cancer patients and their families through emotional , educational and financial support.  (343)083-0732 ? [place] DSS Where to apply for food stamps, Medicaid and utility assistance. 620-344-8082 ? [facility]: Transportation to medical appointments. (470)666-9600 ? Social Security Administration: May apply for disability if have a Stage IV cancer. 403-466-4278 951-206-3281 ? [employer], Disability and Transit Services: Assists with nutrition, care and transit needs. [place] Support Programs: @10RELATIVEDAYS @ > Cancer Support Group  2nd Tuesday of the month 1pm-2pm, Journey Room  > Creative Journey  3rd Tuesday of the month 1130am-1pm, Journey Room  > Look Good Feel Better  1st Wednesday of the month 10am-12 noon, Journey Room (Call [place] to register [phone])

## 2016-02-28 LAB — CANCER ANTIGEN 19-9: CA 19-9: 50 U/mL — ABNORMAL HIGH (ref 0–35)

## 2016-03-02 ENCOUNTER — Encounter (HOSPITAL_COMMUNITY): Payer: Self-pay | Admitting: Hematology & Oncology

## 2016-03-06 ENCOUNTER — Encounter: Payer: Self-pay | Admitting: Nurse Practitioner

## 2016-03-06 ENCOUNTER — Ambulatory Visit (INDEPENDENT_AMBULATORY_CARE_PROVIDER_SITE_OTHER): Payer: Medicare Other | Admitting: Nurse Practitioner

## 2016-03-06 VITALS — BP 128/57 | HR 81 | Temp 98.4°F | Ht 62.0 in | Wt 208.0 lb

## 2016-03-06 DIAGNOSIS — K5900 Constipation, unspecified: Secondary | ICD-10-CM | POA: Diagnosis not present

## 2016-03-06 DIAGNOSIS — R1084 Generalized abdominal pain: Secondary | ICD-10-CM

## 2016-03-06 NOTE — Assessment & Plan Note (Signed)
His abdominal pain is described as "not bad." And typically resolves on its own if he lays down briefly. He does note that his abdominal pain improves after having a bowel movement and better control of his constipation likely produce improved symptoms. However, his abdominal pain is likely contributed to also by his pancreatic mass which she is following with oncology. He is having a CAT scan done for follow-up next week. Recommend he continue following with hematology/oncology as recommended. Return for follow-up in 6 months.  Also, as an aside, he is due for recall colonoscopy in 2019 and he is on the recall list for this.

## 2016-03-06 NOTE — Patient Instructions (Addendum)
1. Give MiraLAX 17 g , mixed into a beverage of the patients choice, once a day, in the morning. 2. Keep Milk of Magnesia on prn dosing for breakthrough constipation 3. Notify us if the patient's constipation does not improve with Miralax 4. He will need a repeat colonoscopy in 2019 5. Otherwise, follow-up in 6 months. 6. Call if any problems or worsening symptoms

## 2016-03-06 NOTE — Assessment & Plan Note (Signed)
The patient has ongoing chronic constipation. This typically been managed on a when necessary basis with milk of magnesia which works well, but takes a day to work. Also, he typically does not ask for dose until he has not gone to the bathroom for about 2-3 days. At this point we'll change his regimen to daily MiraLAX 17 g in the morning. Keep milk of magnesia for when necessary dosing for breakthrough constipation. Return for follow-up in 6 weeks. The facility is to notify us if he is not responding to MiraLAX and we can change regimens. Notify of any worsening symptoms.

## 2016-03-06 NOTE — Progress Notes (Signed)
Referring Provider: Cleda Mccreedy, MD Primary Care Physician:  Carlynn Herald, MD Primary GI:  Dr. Gala Romney  Chief Complaint  Patient presents with  . Abdominal Pain    HPI:   Howard [name] is a 83 y.o. male who resides at [facility] and presents For follow-up. The patient has a remote history of colon cancer with last colonoscopy in 2014 and noted tubular adenoma with recommended surveillance in 2019. Gallbladder ultrasound noted polyp and recommended repeat ultrasound 2018 for surveillance. HIDA scan decreased to 28%. At his last visit on 04/19/2015 he was noted to be a very difficult historian but noted spells of lower abdominal pain described as crampy and typically improved after bowel movement, some constipation requiring straining which is intermittent in nature, some nausea, noted overt GI bleeding.  A follow-up CT of the abdomen completed at the facility due to continued abdominal pain found a new mass in the pancreatic head consistent with primary pancreatic carcinoma without noted metastasis and measuring 3.1 x 1.7 x 2.4 cm. Recommended urgent referral to cancer center for further workup and recommendations. The patient was evaluated by oncology for 05/14/2015 and deemed not a surgical candidate and not likely a systemic chemotherapy candidate given comorbidities. He was referred for endoscopic ultrasound.  EUS completed 06/14/2015 which found normal esophagus, normal stomach, normal duodenum. Cystic lesion in the pancreatic head with fine-needle aspiration for fluid sent for testing. A fluid aspirate found atypical cells tapering reactive mesothelial cells. On exam during EUS the cyst was not malignant or premalignant appearing and it is not felt malignancy is likely. Recommended follow-up CT in several months. CA 19-9 mildly elevated which is not out of the ordinary even for cystic lesion. Follow-up CT found persistent cystic lesion but less conspicuous. Repeat imaging scheduled  for 3 months. Another repeat CT 3 months afterward found enlargement of the cyst, noted IPM and can progress to pancreatic carcinoma although he has not a surgical candidate. Last seen by oncology 02/27/2016 and again noted likely IPM and which could progress to carcinoma although the patient is not a surgical candidate. Anemia stable, leukocytosis stable and chronic. Recommended repeat scans in 2 weeks and call with results.  Today he is accompanied by facility staff who is familiar with him and his medications. He states he's doing ok. Occasional abdominal pain which is tolerable and will pass on its own if he lays down and rests. Denies N/V, hematochezia, melena, weight loss. His weight is up a few pounds. He will have some abdominal pain when he needs to have a bowel movement. Has a bowel movement about every 3-4 days, will be given milk of mag when he asks which tends to work the next day and abdominal pain improves. Has some straining. Denies hematochezia and melena. Denies any other upper or lower GI symptoms.  Past Medical History:  Diagnosis Date  . Adenocarcinoma ([place]) 2003   COLON  . Anemia   . Angina   . Anxiety   . Asthma   . CHF (congestive heart failure) (HCC)    chronic diastolic  . COPD (chronic obstructive pulmonary disease) ([place])   . Coronary artery stenosis    a. 06/2011 Cath: LM 20, LAD min irregs, D1 95 - small,  LCX 60p, RCA 38m, 50d, PLA 50ost;  b. 06/2011 PCI/Rota  RCA  -> 3.0x4mm Promus Element DES  . Diverticula of colon   . Dysphagia   . Esophageal dysmotility   . GERD (gastroesophageal reflux disease)   .  H. pylori infection 03/01/12   treated with prevpac  . Hiatal hernia 08/05/10   egd with Dr. Gala Romney  . Hyperlipidemia   . Hyperplastic polyp of intestine   . Hypertension   . Hypothyroidism   . Internal hemorrhoids   . Ischemic cardiomyopathy    a. 05/2011 Echo: EF 45-50%, inf/post HK  . Kidney stone   . Microscopic colitis   . Mobility impaired     wheelchair dependent  . On supplemental oxygen therapy    oxygen @ 2 l/m nasally  . Osteoarthritis    OF KNEES  . Pancreatic mass 05/28/2015   neoplasm of pancreas  . Pneumonia ~ 2005; 09/2005  . PVD (peripheral vascular disease) ([place])    ABI 0.78 RIGHT AND 0.80 LEFT 4/11  . Schatzki's ring 08/05/10   egd with Dr. Gala Romney  . Shortness of breath 07/17/11   "laying down"  . Tubular adenoma   . Type II diabetes mellitus ([place])     Past Surgical History:  Procedure Laterality Date  . APPENDECTOMY    . ATHERECTOMY  07/17/11  . CATARACT EXTRACTION, BILATERAL    . COLON SURGERY    . COLONOSCOPY  04/2001   Dr. Laural Golden- colon carcinoma  . COLONOSCOPY  09/04/09   Dr. Matthew Folks, hemorrhoids  . COLONOSCOPY  03/11/2012   Dr. Sabino Gasser R hemicolectomy, pancolonic diverticulosis, tubular adenoma, hyperplastic polyp  . CORONARY ANGIOPLASTY  07/17/11   rotablator  . ESOPHAGOGASTRODUODENOSCOPY  04/2001   Dr. Laural Golden  . ESOPHAGOGASTRODUODENOSCOPY  08/05/10   Dr. Evalee Mutton ring, hiatal hernia  . ESOPHAGOGASTRODUODENOSCOPY  03/01/2012   Dr. Gala Romney- gastric erosions, small hiatal hernia, +hpylori,=treated with prevpac  . ESOPHAGOGASTRODUODENOSCOPY N/A 08/11/2013   [id number] Schatzki's ring- s/p dilation as described above. Some retained gastric contents-query delay in gastric emptying. Abnormal gastric mucosa- s/p gastric biopsy (chronic gastritis, intestional metaplasia, no definite h.pylori)  . EUS N/A 06/14/2015   Procedure: UPPER ENDOSCOPIC ULTRASOUND (EUS) LINEAR;  Surgeon: Milus Banister, MD;  Location: [facility];  Service: Endoscopy;  Laterality: N/A;  . HEMICOLECTOMY  2003  . MALONEY DILATION N/A 08/11/2013   Procedure: Venia Minks DILATION;  Surgeon: Daneil Dolin, MD;  [facility];  Service: Endoscopy;  Laterality: N/A;  . PERCUTANEOUS CORONARY STENT INTERVENTION (PCI-S) N/A 07/17/2011   Procedure: PERCUTANEOUS CORONARY STENT INTERVENTION (PCI-S);  Surgeon: Burnell Blanks, MD;  Location: [facility] CATH LAB;  Service: Cardiovascular;  Laterality: N/A;  . SAVORY DILATION N/A 08/11/2013   Procedure: SAVORY DILATION;  Surgeon: Daneil Dolin, MD;  [facility];  Service: Endoscopy;  Laterality: N/A;    Current Outpatient Prescriptions  Medication Sig Dispense Refill  . acetaminophen (TYLENOL) 325 MG tablet Take 650 mg by mouth every 6 (six) hours as needed.    . budesonide-formoterol (SYMBICORT) 160-4.5 MCG/ACT inhaler Inhale 2 puffs into the lungs 2 (two) times daily.    . carvedilol (COREG) 3.125 MG tablet Take 3.125 mg by mouth 2 (two) times daily with a meal.     . Cinnamon 500 MG TABS Take 500 mg by mouth daily.    . clopidogrel (PLAVIX) 75 MG tablet Take 1 tablet (75 mg total) by mouth daily. 90 tablet 3  . dexlansoprazole (DEXILANT) 60 MG capsule Take 60 mg by mouth daily.    . DULoxetine (CYMBALTA) 60 MG capsule Take 60 mg by mouth daily.    Marland Kitchen guaiFENesin (MUCINEX) 600 MG 12 hr tablet Take 600 mg by mouth 2 (two) times daily.    Marland Kitchen  ipratropium-albuterol (DUONEB) 0.5-2.5 (3) MG/3ML SOLN Take 3 mLs by nebulization every 6 (six) hours as needed (wheezing/shortness of breathe.).    Marland Kitchen levothyroxine (SYNTHROID, LEVOTHROID) 50 MCG tablet Take 50 mcg by mouth daily before breakfast.    . lisinopril (PRINIVIL,ZESTRIL) 2.5 MG tablet Take 2.5 mg by mouth daily.    Marland Kitchen LORazepam (ATIVAN) 0.5 MG tablet Take 0.5 mg by mouth daily.     . meclizine (ANTIVERT) 12.5 MG tablet Take 12.5 mg by mouth 2 (two) times daily.     . OXYGEN Inhale 2 L into the lungs continuous.    Vladimir Faster Glycol-Propyl Glycol (SYSTANE) 0.4-0.3 % SOLN Apply 1 drop to eye.    . polyethylene glycol (MIRALAX / GLYCOLAX) packet Take 17 g by mouth daily.    . potassium chloride SA (K-DUR,KLOR-CON) 20 MEQ tablet Take 20 mEq by mouth daily.    Marland Kitchen Propylene Glycol (SYSTANE BALANCE) 0.6 % SOLN Apply 1 drop to eye 3 (three) times daily.     . simvastatin (ZOCOR) 20 MG tablet Take 20 mg by mouth  daily.    . temazepam (RESTORIL) 7.5 MG capsule Take 7.5 mg by mouth at bedtime as needed for sleep.    Marland Kitchen tiotropium (SPIRIVA) 18 MCG inhalation capsule Place 18 mcg into inhaler and inhale daily.    Marland Kitchen torsemide (DEMADEX) 20 MG tablet Take 2 tablets (40 mg total) by mouth daily. 60 tablet 6  . Vitamin D, Ergocalciferol, (DRISDOL) 50000 units CAPS capsule Take 50,000 Units by mouth every 7 (seven) days.    Marland Kitchen albuterol (PROVENTIL) (2.5 MG/3ML) 0.083% nebulizer solution Take 2.5 mg by nebulization every 6 (six) hours.    . gabapentin (NEURONTIN) 100 MG capsule Take 100 mg by mouth at bedtime.    Marland Kitchen HYDROcodone-acetaminophen (NORCO/VICODIN) 5-325 MG tablet Take 1 tablet by mouth every 8 (eight) hours.    . insulin aspart (NOVOLOG) 100 UNIT/ML injection Inject 5 Units into the skin 3 (three) times daily before meals.    . insulin glargine (LANTUS) 100 UNIT/ML injection Inject 25 Units into the skin at bedtime.      No current facility-administered medications for this visit.     Allergies as of 03/06/2016  . (No Known Allergies)    Family History  Problem Relation Age of Onset  . Coronary artery disease Father   . Heart Problems Mother   . Coronary artery disease Brother     CABG  . Diabetes Brother   . Coronary artery disease Brother     Social History   Social History  . Marital status: Married    Spouse name: N/A  . Number of children: N/A  . Years of education: N/A   Social History Main Topics  . Smoking status: Former Smoker    Years: 20.00    Types: Cigarettes    Quit date: 02/25/2008  . Smokeless tobacco: Never Used  . Alcohol use No  . Drug use: No  . Sexual activity: Not Currently   Other Topics Concern  . None   Social History Narrative   Lives in [place]. Mar[place]. Wife in [place].   Son lives with him   Caffeine Use-    Review of Systems: General: Negative for anorexia, weight loss, fever, chills, fatigue, weakness. ENT: Negative for hoarseness, difficulty  swallowing. CV: Negative for chest pain, angina, palpitations, peripheral edema.  Respiratory: Negative for dyspnea at rest, cough, sputum, wheezing.  GI: See history of present illness. Endo: Negative for unusual weight change.  Heme: Negative for bruising or bleeding.   Physical Exam: BP (!) 128/57   Pulse 81   Temp 98.4 F (36.9 C) (Oral)   Ht 5\' 2"  (1.575 m)   Wt 208 lb (94.3 kg)   BMI 38.04 kg/m  General:   Alert and oriented. Pleasant and cooperative. Well-nourished and well-developed.  Eyes:  Without icterus, sclera clear and conjunctiva pink.  Ears:  Normal auditory acuity. Cardiovascular:  S1, S2 present without murmurs appreciated. Extremities without clubbing or edema. Respiratory:  Clear to auscultation bilaterally. No wheezes, rales, or rhonchi. No distress.  Gastrointestinal:  +BS, rounded but soft, non-tender and non-distended. No HSM noted. No guarding or rebound. No masses appreciated.  Rectal:  Deferred  Musculoskalatal:  Symmetrical without gross deformities. Neurologic:  Alert and oriented x4;  grossly normal neurologically. Psych:  Alert and cooperative. Normal mood and affect. Heme/Lymph/Immune: No excessive bruising noted.    03/06/2016 10:48 AM   Disclaimer: This note was dictated with voice recognition software. Similar sounding words can inadvertently be transcribed and may not be corrected upon review.

## 2016-03-06 NOTE — Progress Notes (Signed)
CC'D TO PCP °

## 2016-03-12 ENCOUNTER — Ambulatory Visit (HOSPITAL_COMMUNITY): Payer: Medicare Other

## 2016-05-27 ENCOUNTER — Encounter (HOSPITAL_COMMUNITY): Payer: Medicare Other

## 2016-05-27 ENCOUNTER — Encounter (HOSPITAL_COMMUNITY): Payer: Medicare Other | Attending: Hematology & Oncology | Admitting: Oncology

## 2016-05-27 ENCOUNTER — Encounter (HOSPITAL_COMMUNITY): Payer: Self-pay | Admitting: Oncology

## 2016-05-27 VITALS — BP 125/54 | HR 77 | Temp 98.4°F | Resp 22

## 2016-05-27 DIAGNOSIS — E785 Hyperlipidemia, unspecified: Secondary | ICD-10-CM | POA: Diagnosis not present

## 2016-05-27 DIAGNOSIS — K8689 Other specified diseases of pancreas: Secondary | ICD-10-CM

## 2016-05-27 DIAGNOSIS — F419 Anxiety disorder, unspecified: Secondary | ICD-10-CM | POA: Insufficient documentation

## 2016-05-27 DIAGNOSIS — F0280 Dementia in other diseases classified elsewhere without behavioral disturbance: Secondary | ICD-10-CM

## 2016-05-27 DIAGNOSIS — Z9981 Dependence on supplemental oxygen: Secondary | ICD-10-CM | POA: Insufficient documentation

## 2016-05-27 DIAGNOSIS — Z833 Family history of diabetes mellitus: Secondary | ICD-10-CM | POA: Insufficient documentation

## 2016-05-27 DIAGNOSIS — R630 Anorexia: Secondary | ICD-10-CM | POA: Diagnosis not present

## 2016-05-27 DIAGNOSIS — D649 Anemia, unspecified: Secondary | ICD-10-CM

## 2016-05-27 DIAGNOSIS — Z993 Dependence on wheelchair: Secondary | ICD-10-CM | POA: Insufficient documentation

## 2016-05-27 DIAGNOSIS — D7589 Other specified diseases of blood and blood-forming organs: Secondary | ICD-10-CM | POA: Diagnosis not present

## 2016-05-27 DIAGNOSIS — R634 Abnormal weight loss: Secondary | ICD-10-CM | POA: Diagnosis not present

## 2016-05-27 DIAGNOSIS — I2510 Atherosclerotic heart disease of native coronary artery without angina pectoris: Secondary | ICD-10-CM | POA: Insufficient documentation

## 2016-05-27 DIAGNOSIS — K869 Disease of pancreas, unspecified: Secondary | ICD-10-CM

## 2016-05-27 DIAGNOSIS — R109 Unspecified abdominal pain: Secondary | ICD-10-CM

## 2016-05-27 DIAGNOSIS — M4317 Spondylolisthesis, lumbosacral region: Secondary | ICD-10-CM | POA: Insufficient documentation

## 2016-05-27 DIAGNOSIS — Z955 Presence of coronary angioplasty implant and graft: Secondary | ICD-10-CM | POA: Insufficient documentation

## 2016-05-27 DIAGNOSIS — R112 Nausea with vomiting, unspecified: Secondary | ICD-10-CM | POA: Diagnosis not present

## 2016-05-27 DIAGNOSIS — I110 Hypertensive heart disease with heart failure: Secondary | ICD-10-CM | POA: Insufficient documentation

## 2016-05-27 DIAGNOSIS — E039 Hypothyroidism, unspecified: Secondary | ICD-10-CM | POA: Insufficient documentation

## 2016-05-27 DIAGNOSIS — M25562 Pain in left knee: Secondary | ICD-10-CM | POA: Diagnosis not present

## 2016-05-27 DIAGNOSIS — I739 Peripheral vascular disease, unspecified: Secondary | ICD-10-CM | POA: Insufficient documentation

## 2016-05-27 DIAGNOSIS — Z794 Long term (current) use of insulin: Secondary | ICD-10-CM | POA: Insufficient documentation

## 2016-05-27 DIAGNOSIS — Z8249 Family history of ischemic heart disease and other diseases of the circulatory system: Secondary | ICD-10-CM | POA: Insufficient documentation

## 2016-05-27 DIAGNOSIS — K219 Gastro-esophageal reflux disease without esophagitis: Secondary | ICD-10-CM | POA: Diagnosis not present

## 2016-05-27 DIAGNOSIS — K5900 Constipation, unspecified: Secondary | ICD-10-CM

## 2016-05-27 DIAGNOSIS — Z79899 Other long term (current) drug therapy: Secondary | ICD-10-CM | POA: Insufficient documentation

## 2016-05-27 DIAGNOSIS — D72829 Elevated white blood cell count, unspecified: Secondary | ICD-10-CM

## 2016-05-27 DIAGNOSIS — I5030 Unspecified diastolic (congestive) heart failure: Secondary | ICD-10-CM | POA: Diagnosis not present

## 2016-05-27 DIAGNOSIS — M1990 Unspecified osteoarthritis, unspecified site: Secondary | ICD-10-CM | POA: Insufficient documentation

## 2016-05-27 DIAGNOSIS — F0390 Unspecified dementia without behavioral disturbance: Secondary | ICD-10-CM | POA: Diagnosis not present

## 2016-05-27 DIAGNOSIS — Z7902 Long term (current) use of antithrombotics/antiplatelets: Secondary | ICD-10-CM | POA: Insufficient documentation

## 2016-05-27 DIAGNOSIS — I255 Ischemic cardiomyopathy: Secondary | ICD-10-CM | POA: Insufficient documentation

## 2016-05-27 DIAGNOSIS — G309 Alzheimer's disease, unspecified: Secondary | ICD-10-CM

## 2016-05-27 DIAGNOSIS — E1151 Type 2 diabetes mellitus with diabetic peripheral angiopathy without gangrene: Secondary | ICD-10-CM | POA: Insufficient documentation

## 2016-05-27 DIAGNOSIS — Z7189 Other specified counseling: Secondary | ICD-10-CM

## 2016-05-27 DIAGNOSIS — Z87442 Personal history of urinary calculi: Secondary | ICD-10-CM | POA: Diagnosis not present

## 2016-05-27 DIAGNOSIS — Z87891 Personal history of nicotine dependence: Secondary | ICD-10-CM | POA: Insufficient documentation

## 2016-05-27 DIAGNOSIS — Z85038 Personal history of other malignant neoplasm of large intestine: Secondary | ICD-10-CM | POA: Insufficient documentation

## 2016-05-27 DIAGNOSIS — K210 Gastro-esophageal reflux disease with esophagitis: Secondary | ICD-10-CM | POA: Insufficient documentation

## 2016-05-27 DIAGNOSIS — J449 Chronic obstructive pulmonary disease, unspecified: Secondary | ICD-10-CM | POA: Diagnosis not present

## 2016-05-27 DIAGNOSIS — Z7951 Long term (current) use of inhaled steroids: Secondary | ICD-10-CM | POA: Insufficient documentation

## 2016-05-27 HISTORY — DX: Unspecified dementia, unspecified severity, without behavioral disturbance, psychotic disturbance, mood disturbance, and anxiety: F03.90

## 2016-05-27 LAB — CBC WITH DIFFERENTIAL/PLATELET
BASOS PCT: 0 %
Basophils Absolute: 0 10*3/uL (ref 0.0–0.1)
Eosinophils Absolute: 0.4 10*3/uL (ref 0.0–0.7)
Eosinophils Relative: 3 %
HEMATOCRIT: 31.4 % — AB (ref 39.0–52.0)
Hemoglobin: 10.3 g/dL — ABNORMAL LOW (ref 13.0–17.0)
LYMPHS ABS: 1.4 10*3/uL (ref 0.7–4.0)
LYMPHS PCT: 12 %
MCH: 34 pg (ref 26.0–34.0)
MCHC: 32.8 g/dL (ref 30.0–36.0)
MCV: 103.6 fL — AB (ref 78.0–100.0)
MONO ABS: 1.2 10*3/uL — AB (ref 0.1–1.0)
MONOS PCT: 10 %
NEUTROS ABS: 8.8 10*3/uL — AB (ref 1.7–7.7)
NEUTROS PCT: 75 %
Platelets: 210 10*3/uL (ref 150–400)
RBC: 3.03 MIL/uL — ABNORMAL LOW (ref 4.22–5.81)
RDW: 15.1 % (ref 11.5–15.5)
WBC: 11.8 10*3/uL — ABNORMAL HIGH (ref 4.0–10.5)

## 2016-05-27 LAB — COMPREHENSIVE METABOLIC PANEL
ALT: 16 U/L — ABNORMAL LOW (ref 17–63)
ANION GAP: 7 (ref 5–15)
AST: 19 U/L (ref 15–41)
Albumin: 3.6 g/dL (ref 3.5–5.0)
Alkaline Phosphatase: 62 U/L (ref 38–126)
BILIRUBIN TOTAL: 0.4 mg/dL (ref 0.3–1.2)
BUN: 25 mg/dL — ABNORMAL HIGH (ref 6–20)
CALCIUM: 9.1 mg/dL (ref 8.9–10.3)
CO2: 33 mmol/L — ABNORMAL HIGH (ref 22–32)
Chloride: 97 mmol/L — ABNORMAL LOW (ref 101–111)
Creatinine, Ser: 1.32 mg/dL — ABNORMAL HIGH (ref 0.61–1.24)
GFR, EST AFRICAN AMERICAN: 55 mL/min — AB (ref >60)
GFR, EST NON AFRICAN AMERICAN: 47 mL/min — AB (ref >60)
GLUCOSE: 136 mg/dL — AB (ref 65–99)
POTASSIUM: 4.2 mmol/L (ref 3.5–5.1)
Sodium: 137 mmol/L (ref 135–145)
TOTAL PROTEIN: 6.6 g/dL (ref 6.5–8.1)

## 2016-05-27 NOTE — Assessment & Plan Note (Signed)
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   Labs today: CBC diff, CMET, CA19-9.  I personally reviewed and went over laboratory results with the patient.  The results are noted within this dictation.  Labs are still in process.  Labs in 2 months: CBC diff, CMET, CA19-9.  He was scheduled for CT imaging in Jan 2018, but this was cancelled due to inclement weather and was not rescheduled.  Order is placed for CT abd with contrast for further evaluation of pancreatic mass.  There is a risk of IPMNs transforming into pancreatic malignancy, but realistically, I do not think Treg is a candidate for surgical resection given his many co-morbidities.  Return in 8 weeks for follow-up.  Problem list reviewed with patient and edited accordingly.  Medications are reviewed with the patient and edited accordingly.  More than 50% of the time spent with the patient was utilized for counseling and coordination of care.

## 2016-05-27 NOTE — Assessment & Plan Note (Signed)
Dementia, stable

## 2016-05-27 NOTE — Progress Notes (Signed)
Howard Herald, MD [place] [address]  Pancreatic mass - Plan: CBC with Differential, Comprehensive metabolic panel, Cancer antigen 19-9, CT Abdomen W Contrast  Anemia, unspecified type  Leukocytosis, unspecified type  Goals of care, counseling/discussion  Alzheimer's dementia without behavioral disturbance, unspecified timing of dementia onset  CURRENT THERAPY:  INTERVAL HISTORY: Howard [name] 83 y.o. 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 AND Anemia with macrocytosis AND Leukocytosis, dating back to at least 2014 AND Dementia.  Howard [name] is accompanied by an aid from [facility] who has provided the patient's transportation.  No family members are present today.  Patient has a number of complaints including constipation that is controlled with MiraLAX.  He also reports abdominal pain.  He notes that this too is improved with MiraLAX.  He notes nausea and vomiting occurring infrequently.  He notes episodes of dry heaving.  He also reports left knee pain.  He notes bilateral big toe pain as well.  He reports a decrease in appetite and associated weight loss.  He was scheduled for CT imaging in January but this was canceled due to inclement weather.  Does not appear as though has been rescheduled.  Review of Systems  Constitutional: Positive for weight loss. Negative for chills and fever.  HENT: Negative.   Eyes: Negative.   Respiratory: Negative.  Negative for cough, hemoptysis and shortness of breath.   Cardiovascular: Negative.  Negative for chest pain and leg swelling.  Gastrointestinal: Positive for abdominal pain, constipation, nausea and vomiting. Negative for blood in stool, diarrhea and melena.  Genitourinary: Negative.   Musculoskeletal: Positive for joint pain.  Skin: Negative.   Neurological: Negative.  Negative for weakness.  Endo/Heme/Allergies: Negative.     Psychiatric/Behavioral: Negative.     Past Medical History:  Diagnosis Date  . Adenocarcinoma ([place]) 2003   COLON  . Anemia   . Angina   . Anxiety   . Asthma   . CHF (congestive heart failure) (HCC)    chronic diastolic  . COPD (chronic obstructive pulmonary disease) ([place])   . Coronary artery stenosis    a. 06/2011 Cath: LM 20, LAD min irregs, D1 95 - small,  LCX 60p, RCA 26m, 50d, PLA 50ost;  b. 06/2011 PCI/Rota  RCA  -> 3.0x1mm Promus Element DES  . Dementia 05/27/2016  . Diverticula of colon   . Dysphagia   . Esophageal dysmotility   . GERD (gastroesophageal reflux disease)   . H. pylori infection 03/01/12   treated with prevpac  . Hiatal hernia 08/05/10   egd with Dr. Gala Romney  . Hyperlipidemia   . Hyperplastic polyp of intestine   . Hypertension   . Hypothyroidism   . Internal hemorrhoids   . Ischemic cardiomyopathy    a. 05/2011 Echo: EF 45-50%, inf/post HK  . Kidney stone   . Microscopic colitis   . Mobility impaired    wheelchair dependent  . On supplemental oxygen therapy    oxygen @ 2 l/m nasally  . Osteoarthritis    OF KNEES  . Pancreatic mass 05/28/2015   neoplasm of pancreas  . Pneumonia ~ 2005; 09/2005  . PVD (peripheral vascular disease) ([place])    ABI 0.78 RIGHT AND 0.80 LEFT 4/11  . Schatzki's ring 08/05/10   egd with Dr. Gala Romney  . Shortness of breath 07/17/11   "laying down"  . Tubular adenoma   . Type II diabetes  [facility]ellitus [facility])     Past Surgical History:  Procedure Laterality Date  . APPENDECTOMY    . ATHERECTOMY  07/17/11  . CATARACT EXTRACTION, BILATERAL    . COLON SURGERY    . COLONOSCOPY  04/2001   Dr. Laural Golden- colon carcinoma  . COLONOSCOPY  09/04/09   Dr. Matthew Folks, hemorrhoids  . COLONOSCOPY  03/11/2012   Dr. Sabino Gasser R hemicolectomy, pancolonic diverticulosis, tubular adenoma, hyperplastic polyp  . CORONARY ANGIOPLASTY  07/17/11   rotablator  . ESOPHAGOGASTRODUODENOSCOPY  04/2001   Dr. Laural Golden  . ESOPHAGOGASTRODUODENOSCOPY  08/05/10    Dr. Evalee Mutton ring, hiatal hernia  . ESOPHAGOGASTRODUODENOSCOPY  03/01/2012   Dr. Gala Romney- gastric erosions, small hiatal hernia, +hpylori,=treated with prevpac  . ESOPHAGOGASTRODUODENOSCOPY N/A 08/11/2013   [id number] Schatzki's ring- s/p dilation as described above. Some retained gastric contents-query delay in gastric emptying. Abnormal gastric mucosa- s/p gastric biopsy (chronic gastritis, intestional metaplasia, no definite h.pylori)  . EUS N/A 06/14/2015   Procedure: UPPER ENDOSCOPIC ULTRASOUND (EUS) LINEAR;  Surgeon: Milus Banister, MD;  [facility];  Service: Endoscopy;  Laterality: N/A;  . HEMICOLECTOMY  2003  . MALONEY DILATION N/A 08/11/2013   Procedure: Venia Minks DILATION;  Surgeon: Daneil Dolin, MD;  [facility];  Service: Endoscopy;  Laterality: N/A;  . PERCUTANEOUS CORONARY STENT INTERVENTION (PCI-S) N/A 07/17/2011   Procedure: PERCUTANEOUS CORONARY STENT INTERVENTION (PCI-S);  Surgeon: Burnell Blanks, MD;  Location: [facility] CATH LAB;  Service: Cardiovascular;  Laterality: N/A;  . SAVORY DILATION N/A 08/11/2013   Procedure: SAVORY DILATION;  Surgeon: Daneil Dolin, MD;  [facility];  Service: Endoscopy;  Laterality: N/A;    Family History  Problem Relation Age of Onset  . Coronary artery disease Father   . Heart Problems Mother   . Coronary artery disease Brother     CABG  . Diabetes Brother   . Coronary artery disease Brother     Social History   Social History  . Marital status: Married    Spouse name: N/A  . Number of children: N/A  . Years of education: N/A   Social History Main Topics  . Smoking status: Former Smoker    Years: 20.00    Types: Cigarettes    Quit date: 02/25/2008  . Smokeless tobacco: Never Used  . Alcohol use No  . Drug use: No  . Sexual activity: Not Currently   Other Topics Concern  . None   Social History Narrative   Lives in [place]. Mar[place]. Wife in [place].   Son lives with him   Caffeine Use-      PHYSICAL EXAMINATION  ECOG PERFORMANCE STATUS: 2 - Symptomatic, <50% confined to bed  Vitals:   05/27/16 1455  BP: (!) 125/54  Pulse: 77  Resp: (!) 22  Temp: 98.4 F (36.9 C)    GENERAL:alert, no distress, well nourished, well developed, comfortable, cooperative, obese, smiling and demented, in wheelchair, accompanied by aid from nursing facility, [place] in place for O2 delivery. SKIN: skin color, texture, turgor are normal, no rashes or significant lesions HEAD: Normocephalic, No masses, lesions, tenderness or abnormalities EYES: normal, EOMI, Conjunctiva are pink and non-injected EARS: External ears normal OROPHARYNX:lips, buccal mucosa, and tongue normal and mucous membranes are moist  NECK: supple, no adenopathy, trachea midline LYMPH:  no palpable lymphadenopathy BREAST:not examined LUNGS: clear to auscultation  HEART: regular rate & rhythm ABDOMEN:abdomen soft, obese and normal bowel sounds BACK: Back symmetric, no curvature. EXTREMITIES:less then 2 second capillary refill, no  joint deformities, effusion, or inflammation, no skin discoloration, no cyanosis  NEURO: alert & oriented x 3 with fluent speech, no focal motor/sensory deficits   LABORATORY DATA: CBC    Component Value Date/Time   WBC 13.1 (H) 02/27/2016 1318   RBC 3.06 (L) 02/27/2016 1318   HGB 10.7 (L) 02/27/2016 1318   HGB 11.5 11/04/2012   HCT 32.8 (L) 02/27/2016 1318   HCT 35 11/04/2012   PLT 203 02/27/2016 1318   MCV 107.2 (H) 02/27/2016 1318   MCV 98.3 11/04/2012   MCH 35.0 (H) 02/27/2016 1318   MCHC 32.6 02/27/2016 1318   RDW 15.0 02/27/2016 1318   LYMPHSABS 1.2 02/27/2016 1318   MONOABS 1.4 (H) 02/27/2016 1318   EOSABS 0.3 02/27/2016 1318   BASOSABS 0.0 02/27/2016 1318      Chemistry      Component Value Date/Time   NA 136 02/27/2016 1318   K 4.6 02/27/2016 1318   K 4.5 11/08/2012   CL 99 (L) 02/27/2016 1318   CO2 31 02/27/2016 1318   BUN 40 (H) 02/27/2016 1318   BUN 23 (A)  11/08/2012   CREATININE 1.40 (H) 02/27/2016 1318   CREATININE 0.85 07/07/2011 1419      Component Value Date/Time   CALCIUM 8.8 (L) 02/27/2016 1318   ALKPHOS 59 02/27/2016 1318   ALKPHOS 83 11/04/2012   AST 27 02/27/2016 1318   AST 19 11/04/2012   ALT 35 02/27/2016 1318   BILITOT 0.4 02/27/2016 1318   BILITOT 0.5 11/04/2012        PENDING LABS:   RADIOGRAPHIC STUDIES:  No results found.   PATHOLOGY:    ASSESSMENT AND PLAN:  Pancreatic mass Pancreatic mass suspicious for intraductal papillary mucinous neoplasm (IPMN) with a mildly elevated CA 19-9 (which is not uncommon with a cystic lesion).   Labs today: CBC diff, CMET, CA19-9.  I personally reviewed and went over laboratory results with the patient.  The results are noted within this dictation.  Labs are still in process.  Labs in 2 months: CBC diff, CMET, CA19-9.  He was scheduled for CT imaging in Jan 2018, but this was cancelled due to inclement weather and was not rescheduled.  Order is placed for CT abd with contrast for further evaluation of pancreatic mass.  There is a risk of IPMNs transforming into pancreatic malignancy, but realistically, I do not think [place] is a candidate for surgical resection given his many co-morbidities.  Return in 8 weeks for follow-up.  Problem list reviewed with patient and edited accordingly.  Medications are reviewed with the patient and edited accordingly.  More than 50% of the time spent with the patient was utilized for counseling and coordination of care.  Leukocytosis Leukocytosis, dating back to at least 2014   Anemia Anemia with macrocytosis- stable  Dementia Dementia, stable   ORDERS PLACED FOR THIS ENCOUNTER: Orders Placed This Encounter  Procedures  . CT Abdomen W Contrast  . CBC with Differential  . Comprehensive metabolic panel  . Cancer antigen 19-9    MEDICATIONS PRESCRIBED THIS ENCOUNTER: No orders of the defined types were placed in this  encounter.   THERAPY PLAN:  Follow-up CT scan in near future.  Ongoing observation.  Realistically, he is not a candidate for any intervention/treatment.   All questions were answered. The patient knows to call the clinic with any problems, questions or concerns. We can certainly see the patient much sooner if necessary.  Patient and plan discussed with Dr. Twana First  and she is in agreement with the aforementioned.   This note is electronically signed by: Doy Mince 05/27/2016 3:40 PM

## 2016-05-27 NOTE — Assessment & Plan Note (Signed)
Anemia with macrocytosis- stable

## 2016-05-27 NOTE — Assessment & Plan Note (Signed)
Leukocytosis, dating back to at least 2014

## 2016-05-27 NOTE — Patient Instructions (Addendum)
[place] at [facility] Discharge Instructions  RECOMMENDATIONS MADE BY THE CONSULTANT AND ANY TEST RESULTS WILL BE SENT TO YOUR REFERRING PHYSICIAN.  You were seen today by Kirby Crigler PA-C. CT scan in 3-4 weeks. Return in 2 months for labs and follow up.    Thank you for choosing [place] at [facility] to provide your oncology and hematology care.  To afford each patient quality time with our provider, please arrive at least 15 minutes before your scheduled appointment time.    If you have a lab appointment with the [facility] please come in thru the  Main Entrance and check in at the main information desk  You need to re-schedule your appointment should you arrive 10 or more minutes late.  We strive to give you quality time with our providers, and arriving late affects you and other patients whose appointments are after yours.  Also, if you no show three or more times for appointments you may be dismissed from the clinic at the providers discretion.     Again, thank you for choosing [facility].  Our hope is that these requests will decrease the amount of time that you wait before being seen by our physicians.       _____________________________________________________________  Should you have questions after your visit to [facility], please contact our office at [phone] between the hours of 8:30 a.m. and 4:30 p.m.  Voicemails left after 4:30 p.m. will not be returned until the following business day.  For prescription refill requests, have your pharmacy contact our office.       Resources For Cancer Patients and their Caregivers ? American Cancer Society: Can assist with transportation, wigs, general needs, runs Look Good Feel Better.        567-479-6950 ? Cancer Care: Provides financial assistance, online support groups, medication/co-pay assistance.  1-800-813-HOPE (681) 446-7724) ? [place] Assists [place] cancer patients and their families through emotional , educational and financial support.  (660) 309-9253 ? [place] DSS Where to apply for food stamps, Medicaid and utility assistance. (332) 797-3097 ? [employer]: Transportation to medical appointments. (781)599-9496 ? Social Security Administration: May apply for disability if have a Stage IV cancer. 646-012-5201 (585) 434-7946 ? [employer], Disability and Transit Services: Assists with nutrition, care and transit needs. [place] Support Programs: @10RELATIVEDAYS @ > Cancer Support Group  2nd Tuesday of the month 1pm-2pm, Journey Room  > Creative Journey  3rd Tuesday of the month 1130am-1pm, Journey Room  > Look Good Feel Better  1st Wednesday of the month 10am-12 noon, Journey Room (Call [place] to register [phone])

## 2016-05-28 LAB — CANCER ANTIGEN 19-9: CA 19 9: 50 U/mL — AB (ref 0–35)

## 2016-06-24 ENCOUNTER — Ambulatory Visit (HOSPITAL_COMMUNITY): Admission: RE | Admit: 2016-06-24 | Payer: Medicare Other | Source: Ambulatory Visit

## 2016-07-09 ENCOUNTER — Ambulatory Visit (HOSPITAL_COMMUNITY): Payer: Medicare Other

## 2016-07-11 ENCOUNTER — Ambulatory Visit (HOSPITAL_COMMUNITY): Admission: RE | Admit: 2016-07-11 | Payer: Medicare Other | Source: Ambulatory Visit

## 2016-07-14 ENCOUNTER — Other Ambulatory Visit (HOSPITAL_COMMUNITY): Payer: Medicare Other

## 2016-07-22 ENCOUNTER — Ambulatory Visit (HOSPITAL_COMMUNITY): Payer: Medicare Other | Admitting: Hematology

## 2016-07-22 ENCOUNTER — Other Ambulatory Visit (HOSPITAL_COMMUNITY): Payer: Medicare Other

## 2016-07-29 ENCOUNTER — Ambulatory Visit (HOSPITAL_COMMUNITY)
Admission: RE | Admit: 2016-07-29 | Discharge: 2016-07-29 | Disposition: A | Discharge status: Home / Self Care | Payer: Medicare Other | Source: Ambulatory Visit | Attending: Oncology | Admitting: Oncology

## 2016-07-29 ENCOUNTER — Encounter (HOSPITAL_COMMUNITY): Payer: Self-pay

## 2016-07-29 DIAGNOSIS — K869 Disease of pancreas, unspecified: Secondary | ICD-10-CM | POA: Diagnosis not present

## 2016-07-29 DIAGNOSIS — K8689 Other specified diseases of pancreas: Secondary | ICD-10-CM

## 2016-07-29 LAB — POCT I-STAT CREATININE: Creatinine, Ser: 1.5 mg/dL — ABNORMAL HIGH (ref 0.61–1.24)

## 2016-07-29 MED ORDER — IOPAMIDOL (ISOVUE-300) INJECTION 61%
100.0000 mL | Freq: Once | INTRAVENOUS | Status: AC | PRN
  Administered 2016-07-29: 80 mL via INTRAVENOUS

## 2016-08-11 ENCOUNTER — Encounter (HOSPITAL_COMMUNITY): Payer: Self-pay

## 2016-08-11 ENCOUNTER — Encounter (HOSPITAL_COMMUNITY): Payer: Medicare Other | Attending: Hematology & Oncology

## 2016-08-11 ENCOUNTER — Encounter (HOSPITAL_BASED_OUTPATIENT_CLINIC_OR_DEPARTMENT_OTHER): Payer: Medicare Other | Admitting: Oncology

## 2016-08-11 VITALS — BP 114/61 | HR 89 | Resp 16

## 2016-08-11 DIAGNOSIS — Z7902 Long term (current) use of antithrombotics/antiplatelets: Secondary | ICD-10-CM | POA: Insufficient documentation

## 2016-08-11 DIAGNOSIS — Z794 Long term (current) use of insulin: Secondary | ICD-10-CM | POA: Insufficient documentation

## 2016-08-11 DIAGNOSIS — Z9981 Dependence on supplemental oxygen: Secondary | ICD-10-CM | POA: Insufficient documentation

## 2016-08-11 DIAGNOSIS — N189 Chronic kidney disease, unspecified: Secondary | ICD-10-CM | POA: Diagnosis not present

## 2016-08-11 DIAGNOSIS — E1151 Type 2 diabetes mellitus with diabetic peripheral angiopathy without gangrene: Secondary | ICD-10-CM | POA: Diagnosis not present

## 2016-08-11 DIAGNOSIS — F0390 Unspecified dementia without behavioral disturbance: Secondary | ICD-10-CM | POA: Diagnosis not present

## 2016-08-11 DIAGNOSIS — Z955 Presence of coronary angioplasty implant and graft: Secondary | ICD-10-CM | POA: Diagnosis not present

## 2016-08-11 DIAGNOSIS — D7589 Other specified diseases of blood and blood-forming organs: Secondary | ICD-10-CM | POA: Diagnosis not present

## 2016-08-11 DIAGNOSIS — Z993 Dependence on wheelchair: Secondary | ICD-10-CM | POA: Diagnosis not present

## 2016-08-11 DIAGNOSIS — F419 Anxiety disorder, unspecified: Secondary | ICD-10-CM | POA: Insufficient documentation

## 2016-08-11 DIAGNOSIS — K210 Gastro-esophageal reflux disease with esophagitis: Secondary | ICD-10-CM | POA: Insufficient documentation

## 2016-08-11 DIAGNOSIS — M1990 Unspecified osteoarthritis, unspecified site: Secondary | ICD-10-CM | POA: Diagnosis not present

## 2016-08-11 DIAGNOSIS — K8689 Other specified diseases of pancreas: Secondary | ICD-10-CM

## 2016-08-11 DIAGNOSIS — D509 Iron deficiency anemia, unspecified: Secondary | ICD-10-CM | POA: Diagnosis not present

## 2016-08-11 DIAGNOSIS — Z87442 Personal history of urinary calculi: Secondary | ICD-10-CM | POA: Insufficient documentation

## 2016-08-11 DIAGNOSIS — I110 Hypertensive heart disease with heart failure: Secondary | ICD-10-CM | POA: Insufficient documentation

## 2016-08-11 DIAGNOSIS — Z8249 Family history of ischemic heart disease and other diseases of the circulatory system: Secondary | ICD-10-CM | POA: Insufficient documentation

## 2016-08-11 DIAGNOSIS — Z87891 Personal history of nicotine dependence: Secondary | ICD-10-CM | POA: Insufficient documentation

## 2016-08-11 DIAGNOSIS — E785 Hyperlipidemia, unspecified: Secondary | ICD-10-CM | POA: Insufficient documentation

## 2016-08-11 DIAGNOSIS — D72829 Elevated white blood cell count, unspecified: Secondary | ICD-10-CM | POA: Insufficient documentation

## 2016-08-11 DIAGNOSIS — K869 Disease of pancreas, unspecified: Secondary | ICD-10-CM | POA: Insufficient documentation

## 2016-08-11 DIAGNOSIS — I255 Ischemic cardiomyopathy: Secondary | ICD-10-CM | POA: Diagnosis not present

## 2016-08-11 DIAGNOSIS — I739 Peripheral vascular disease, unspecified: Secondary | ICD-10-CM | POA: Insufficient documentation

## 2016-08-11 DIAGNOSIS — Z85038 Personal history of other malignant neoplasm of large intestine: Secondary | ICD-10-CM | POA: Insufficient documentation

## 2016-08-11 DIAGNOSIS — Z833 Family history of diabetes mellitus: Secondary | ICD-10-CM | POA: Insufficient documentation

## 2016-08-11 DIAGNOSIS — Z7951 Long term (current) use of inhaled steroids: Secondary | ICD-10-CM | POA: Insufficient documentation

## 2016-08-11 DIAGNOSIS — I2510 Atherosclerotic heart disease of native coronary artery without angina pectoris: Secondary | ICD-10-CM | POA: Diagnosis not present

## 2016-08-11 DIAGNOSIS — I5030 Unspecified diastolic (congestive) heart failure: Secondary | ICD-10-CM | POA: Insufficient documentation

## 2016-08-11 DIAGNOSIS — D649 Anemia, unspecified: Secondary | ICD-10-CM | POA: Diagnosis not present

## 2016-08-11 DIAGNOSIS — M4317 Spondylolisthesis, lumbosacral region: Secondary | ICD-10-CM | POA: Diagnosis not present

## 2016-08-11 DIAGNOSIS — Z79899 Other long term (current) drug therapy: Secondary | ICD-10-CM | POA: Insufficient documentation

## 2016-08-11 DIAGNOSIS — K219 Gastro-esophageal reflux disease without esophagitis: Secondary | ICD-10-CM | POA: Diagnosis not present

## 2016-08-11 DIAGNOSIS — J449 Chronic obstructive pulmonary disease, unspecified: Secondary | ICD-10-CM | POA: Diagnosis not present

## 2016-08-11 DIAGNOSIS — E039 Hypothyroidism, unspecified: Secondary | ICD-10-CM | POA: Diagnosis not present

## 2016-08-11 LAB — CBC WITH DIFFERENTIAL/PLATELET
Basophils Absolute: 0 10*3/uL (ref 0.0–0.1)
Basophils Relative: 0 %
EOS ABS: 0.4 10*3/uL (ref 0.0–0.7)
EOS PCT: 3 %
HCT: 28.3 % — ABNORMAL LOW (ref 39.0–52.0)
Hemoglobin: 9.1 g/dL — ABNORMAL LOW (ref 13.0–17.0)
LYMPHS ABS: 1.5 10*3/uL (ref 0.7–4.0)
LYMPHS PCT: 14 %
MCH: 33.1 pg (ref 26.0–34.0)
MCHC: 32.2 g/dL (ref 30.0–36.0)
MCV: 102.9 fL — AB (ref 78.0–100.0)
MONO ABS: 1.1 10*3/uL — AB (ref 0.1–1.0)
Monocytes Relative: 11 %
Neutro Abs: 7.6 10*3/uL (ref 1.7–7.7)
Neutrophils Relative %: 72 %
PLATELETS: 170 10*3/uL (ref 150–400)
RBC: 2.75 MIL/uL — ABNORMAL LOW (ref 4.22–5.81)
RDW: 14.3 % (ref 11.5–15.5)
WBC: 10.6 10*3/uL — AB (ref 4.0–10.5)

## 2016-08-11 LAB — COMPREHENSIVE METABOLIC PANEL
ALT: 16 U/L — AB (ref 17–63)
AST: 21 U/L (ref 15–41)
Albumin: 3.3 g/dL — ABNORMAL LOW (ref 3.5–5.0)
Alkaline Phosphatase: 53 U/L (ref 38–126)
Anion gap: 9 (ref 5–15)
BUN: 32 mg/dL — ABNORMAL HIGH (ref 6–20)
CHLORIDE: 96 mmol/L — AB (ref 101–111)
CO2: 32 mmol/L (ref 22–32)
CREATININE: 1.4 mg/dL — AB (ref 0.61–1.24)
Calcium: 8.7 mg/dL — ABNORMAL LOW (ref 8.9–10.3)
GFR, EST AFRICAN AMERICAN: 51 mL/min — AB (ref >60)
GFR, EST NON AFRICAN AMERICAN: 44 mL/min — AB (ref >60)
Glucose, Bld: 164 mg/dL — ABNORMAL HIGH (ref 65–99)
POTASSIUM: 4.2 mmol/L (ref 3.5–5.1)
SODIUM: 137 mmol/L (ref 135–145)
Total Bilirubin: 0.5 mg/dL (ref 0.3–1.2)
Total Protein: 6 g/dL — ABNORMAL LOW (ref 6.5–8.1)

## 2016-08-11 NOTE — Progress Notes (Signed)
Howard [name], [place] [address]  No diagnosis found.  CURRENT THERAPY:  INTERVAL HISTORY: Howard [name] 83 y.o. 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 AND Anemia with macrocytosis AND Leukocytosis, dating back to at least 2014 AND Dementia.  [name] presents today for continued follow-up. He complains of pain in his legs from varicose veins. He complains of constipation but otherwise has no complaints today.   He had a CT of the pelvis with and without contrast on 07/29/16 which demonstrated no significant change in the small cystic lesion the pancreatic head.  Review of Systems  Constitutional: Negative for chills, fever and weight loss.  HENT: Negative.   Eyes: Negative.   Respiratory: Negative.  Negative for cough, hemoptysis and shortness of breath.   Cardiovascular: Negative.  Negative for chest pain and leg swelling.  Gastrointestinal: Positive for constipation. Negative for abdominal pain, blood in stool, diarrhea, melena, nausea and vomiting.  Genitourinary: Negative.   Musculoskeletal: Negative for joint pain.  Skin: Negative.   Neurological: Negative.  Negative for weakness.  Endo/Heme/Allergies: Negative.   Psychiatric/Behavioral: Negative.     Past Medical History:  Diagnosis Date  . Adenocarcinoma ([place]) 2003   COLON  . Anemia   . Angina   . Anxiety   . Asthma   . CHF (congestive heart failure) (HCC)    chronic diastolic  . COPD (chronic obstructive pulmonary disease) ([place])   . Coronary artery stenosis    a. 06/2011 Cath: LM 20, LAD min irregs, D1 95 - small,  LCX 60p, RCA 27m, 50d, PLA 50ost;  b. 06/2011 PCI/Rota  RCA  -> 3.0x34mm Promus Element DES  . Dementia 05/27/2016  . Diverticula of colon   . Dysphagia   . Esophageal dysmotility   . GERD (gastroesophageal reflux disease)   . H. pylori infection 03/01/12   treated with prevpac  .  Hiatal hernia 08/05/10   egd with Dr. Gala Romney  . Hyperlipidemia   . Hyperplastic polyp of intestine   . Hypertension   . Hypothyroidism   . Internal hemorrhoids   . Ischemic cardiomyopathy    a. 05/2011 Echo: EF 45-50%, inf/post HK  . Kidney stone   . Microscopic colitis   . Mobility impaired    wheelchair dependent  . On supplemental oxygen therapy    oxygen @ 2 l/m nasally  . Osteoarthritis    OF KNEES  . Pancreatic mass 05/28/2015   neoplasm of pancreas  . Pneumonia ~ 2005; 09/2005  . PVD (peripheral vascular disease) ([place])    ABI 0.78 RIGHT AND 0.80 LEFT 4/11  . Schatzki's ring 08/05/10   egd with Dr. Gala Romney  . Shortness of breath 07/17/11   "laying down"  . Tubular adenoma   . Type II diabetes mellitus ([place])     Past Surgical History:  Procedure Laterality Date  . APPENDECTOMY    . ATHERECTOMY  07/17/11  . CATARACT EXTRACTION, BILATERAL    . COLON SURGERY    . COLONOSCOPY  04/2001   Dr. Laural Golden- colon carcinoma  . COLONOSCOPY  09/04/09   Dr. Matthew Folks, hemorrhoids  . COLONOSCOPY  03/11/2012   Dr. Sabino Gasser R hemicolectomy, pancolonic diverticulosis, tubular adenoma, hyperplastic polyp  . CORONARY ANGIOPLASTY  07/17/11   rotablator  . ESOPHAGOGASTRODUODENOSCOPY  04/2001   Dr. Laural Golden  . ESOPHAGOGASTRODUODENOSCOPY  08/05/10   Dr. Evalee Mutton ring, hiatal hernia  . ESOPHAGOGASTRODUODENOSCOPY  03/01/2012   Dr. Gala Romney- gastric erosions, small hiatal hernia, +hpylori,=treated with prevpac  . ESOPHAGOGASTRODUODENOSCOPY N/A 08/11/2013   [id number] Schatzki's ring- s/p dilation as described above. Some retained gastric contents-query delay in gastric emptying. Abnormal gastric mucosa- s/p gastric biopsy (chronic gastritis, intestional metaplasia, no definite h.pylori)  . EUS N/A 06/14/2015   Procedure: UPPER ENDOSCOPIC ULTRASOUND (EUS) LINEAR;  Surgeon: Milus Banister, MD;  Location: [facility] END[facility];  Service: Endoscopy;  Laterality: N/A;  . HEMICOLECTOMY  2003  . MALONEY DILATION  N/A 08/11/2013   Procedure: Venia Minks DILATION;  Surgeon: Daneil Dolin, MD;  [facility];  Service: Endoscopy;  Laterality: N/A;  . PERCUTANEOUS CORONARY STENT INTERVENTION (PCI-S) N/A 07/17/2011   Procedure: PERCUTANEOUS CORONARY STENT INTERVENTION (PCI-S);  Surgeon: Burnell Blanks, MD;  Location: [facility] CATH LAB;  Service: Cardiovascular;  Laterality: N/A;  . SAVORY DILATION N/A 08/11/2013   Procedure: SAVORY DILATION;  Surgeon: Daneil Dolin, MD;  [facility];  Service: Endoscopy;  Laterality: N/A;    Family History  Problem Relation Age of Onset  . Coronary artery disease Father   . Heart Problems Mother   . Coronary artery disease Brother        CABG  . Diabetes Brother   . Coronary artery disease Brother     Social History   Social History  . Marital status: Married    Spouse name: N/A  . Number of children: N/A  . Years of education: N/A   Social History Main Topics  . Smoking status: Former Smoker    Years: 20.00    Types: Cigarettes    Quit date: 02/25/2008  . Smokeless tobacco: Never Used  . Alcohol use No  . Drug use: No  . Sexual activity: Not Currently   Other Topics Concern  . Not on file   Social History Narrative   Lives in [place]. Mar[place]. Wife in [place].   Son lives with him   Caffeine Use-     PHYSICAL EXAMINATION  ECOG PERFORMANCE STATUS: 2 - Symptomatic, <50% confined to bed  Physical Exam  Constitutional: He is well-developed, well-nourished, and in no distress. No distress.  In wheelchair, nasal oxygen   HENT:  Head: Normocephalic and atraumatic.  Mouth/Throat: No oropharyngeal exudate.  Eyes: Conjunctivae are normal. Pupils are equal, round, and reactive to light. No scleral icterus.  Neck: Normal range of motion. Neck supple. No JVD present.  Cardiovascular: Normal rate, regular rhythm and normal heart sounds.  Exam reveals no gallop and no friction rub.   No murmur heard. Pulmonary/Chest: Breath sounds  normal. No respiratory distress. He has no wheezes. He has no rales.  Abdominal: Soft. Bowel sounds are normal. He exhibits no distension. There is no tenderness. There is no guarding.  Musculoskeletal: He exhibits no edema or tenderness.  Lymphadenopathy:    He has no cervical adenopathy.  Neurological: He is alert. No cranial nerve deficit.  Not oriented to time  Skin: Skin is warm and dry. No rash noted. No erythema. No pallor.  Psychiatric: Affect and judgment normal.     LABORATORY DATA: CBC    Component Value Date/Time   WBC 10.6 (H) 08/11/2016 1442   RBC 2.75 (L) 08/11/2016 1442   HGB 9.1 (L) 08/11/2016 1442   HGB 11.5 11/04/2012   HCT 28.3 (L) 08/11/2016 1442   HCT 35 11/04/2012   PLT 170 08/11/2016 1442   MCV 102.9 (H) 08/11/2016 1442   MCV 98.3 11/04/2012   MCH  33.1 08/11/2016 1442   MCHC 32.2 08/11/2016 1442   RDW 14.3 08/11/2016 1442   LYMPHSABS 1.5 08/11/2016 1442   MONOABS 1.1 (H) 08/11/2016 1442   EOSABS 0.4 08/11/2016 1442   BASOSABS 0.0 08/11/2016 1442  CA 19-9  Cancer antigen 19-9  Collected: 02/27/16 1318  Resulting lab: [facility]  Reference range: 0 - 35 U/mL  Value: 50 (High)  Comment: (NOTE)  Roche ECLIA methodology  Performed At: [facility]  [place], [place] [id number]  Lindon Romp MD NL:[id number]   *Additional information available - comment   Cancer antigen 19-9 (Order [id number])  Cancer antigen 19-9  Order: [id number]  Status:  Final result Visible to patient:  No (Not Released) Next appt:  09/03/2016 at 10:00 AM in Gastroenterology Annitta Needs, NP) Dx:  Goals of care, counseling/discussion  Newer results are available. Click to view them now.    Ref Range & Units 4mo ago 66mo ago 1yr ago   CA 19-9 0 - 35 U/mL 50   40CM   46CM    Comment: (NOTE)  Roche ECLIA methodology  Performed At: [facility]  [place], [place] [id number]  Lindon Romp MD NL:[id number]   Resulting Agency   [facility]    Specimen Collected: 02/27/16 13:18 Last Resulted: 02/28/16 07:37            CM=Additional comments        Other Results from 02/27/2016       Chemistry      Component Value Date/Time   NA 137 08/11/2016 1442   K 4.2 08/11/2016 1442   K 4.5 11/08/2012   CL 96 (L) 08/11/2016 1442   CO2 32 08/11/2016 1442   BUN 32 (H) 08/11/2016 1442   BUN 23 (A) 11/08/2012   CREATININE 1.40 (H) 08/11/2016 1442   CREATININE 0.85 07/07/2011 1419      Component Value Date/Time   CALCIUM 8.7 (L) 08/11/2016 1442   ALKPHOS 53 08/11/2016 1442   ALKPHOS 83 11/04/2012   AST 21 08/11/2016 1442   AST 19 11/04/2012   ALT 16 (L) 08/11/2016 1442   BILITOT 0.5 08/11/2016 1442   BILITOT 0.5 11/04/2012        PENDING LABS:   RADIOGRAPHIC STUDIES:  Ct Abdomen W Wo Contrast  Result Date: 07/29/2016 CLINICAL DATA:  Followup pancreatic mass. EXAM: CT ABDOMEN WITHOUT AND WITH CONTRAST TECHNIQUE: Multidetector CT imaging of the abdomen was performed following the standard protocol before and following the bolus administration of intravenous contrast. CONTRAST:  101mL ISOVUE-300 IOPAMIDOL (ISOVUE-300) INJECTION 61% COMPARISON:  11/21/2015, 05/18/2015, and 06/27/2009 FINDINGS: Lower chest: No acute findings. Hepatobiliary: No masses identified. Tiny sub-cm cysts in the right and left lobes shows no significant change. Tiny calcified gallstone noted, however there is no evidence of cholecystitis or biliary ductal dilatation. Pancreas: A cystic lesion is again seen in the pancreatic head which measures 2.3 x 1.9 cm on image 45/4. This remains stable in size compared to prior studies in 2017, but appears new compared to earlier exam 2011. No evidence of pancreatic ductal dilatation or peripancreatic inflammatory changes. This lesion does not involve the superior mesenteric artery or vein, or portal vein. Spleen:  Within normal limits in size and appearance. Adrenals/Urinary Tract: No  adrenal masses identified. Extensive renal vascular calcification noted bilaterally. Possible small nonobstructing right renal calculus remains stable. Stable small benign-appearing right renal cyst and bilateral renal parenchymal atrophy. No evidence of hydronephrosis. Stomach/Bowel: Postop  changes again seen from previous right colectomy. Visualized portions within the abdomen are unremarkable. Vascular/Lymphatic: No pathologically enlarged lymph nodes identified. No abdominal aortic aneurysm. Aortic atherosclerosis. Other:  None. Musculoskeletal:  No suspicious bone lesions identified. IMPRESSION: No significant change in small cystic lesion in pancreatic head, suspicious for indolent cystic pancreatic neoplasm. Continued imaging follow-up recommended in 12 months. Abdomen MRI without and with contrast is the preferred exam. Abdomen CT without and with contrast would be appropriate if patient has contraindication to MRI or cannot cooperate with breath-holding. Electronically Signed   By: Earle Gell M.D.   On: 07/29/2016 16:27         ASSESSMENT AND PLAN:  Pancreatic mass 1. 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 Clinically stable on recent CT abd/pelvis on 07/29/16. Continue obervation with repeat CT abd w/ and w/o contrast in 12 months. Patient is a poor surgical candidate.   2. Macrocytic anemia- may be multifactorial from CKD as well. Will check labs on next visit. Orders Placed This Encounter  Procedures  . CBC with Differential    Standing Status:   Future    Standing Expiration Date:   08/11/2017  . Comprehensive metabolic panel    Standing Status:   Future    Standing Expiration Date:   08/11/2017  . Immunofixation electrophoresis    Standing Status:   Future    Standing Expiration Date:   08/11/2017  . Erythropoietin    Standing Status:   Future    Standing Expiration  Date:   08/11/2017  . Iron and TIBC    Standing Status:   Future    Standing Expiration Date:   08/11/2017  . Ferritin    Standing Status:   Future    Standing Expiration Date:   08/11/2017  . Vitamin B12    Standing Status:   Future    Standing Expiration Date:   08/11/2017  . Folate    Standing Status:   Future    Standing Expiration Date:   08/11/2017     RTC in 3 months for follow up of his anemia labs.    This note is electronically signed by: Twana First, MD 08/11/2016 3:35 PM

## 2016-08-12 LAB — CANCER ANTIGEN 19-9: CA 19-9: 44 U/mL — ABNORMAL HIGH (ref 0–35)

## 2016-09-03 ENCOUNTER — Encounter: Payer: Self-pay | Admitting: Gastroenterology

## 2016-09-03 ENCOUNTER — Ambulatory Visit (INDEPENDENT_AMBULATORY_CARE_PROVIDER_SITE_OTHER): Payer: Medicare Other | Admitting: Gastroenterology

## 2016-09-03 VITALS — BP 123/57 | HR 78 | Temp 97.6°F | Ht 67.0 in | Wt 198.8 lb

## 2016-09-03 DIAGNOSIS — K5900 Constipation, unspecified: Secondary | ICD-10-CM

## 2016-09-03 NOTE — Patient Instructions (Signed)
Let's try Linzess 145 micrograms each morning, 30 minutes before breakfast. This may cause loose stool the first few days but should get better. If not, call us.   Stop Miralax for now only taking Linzess. If needed, you can take Miralax as needed.   We will see you back in 6-8 weeks.

## 2016-09-03 NOTE — Assessment & Plan Note (Signed)
Not ideally managed with Miralax daily. Start Linzess 145 mcg once daily. Use Miralax only as needed. Return in 6-8 weeks. Colonoscopy if health permits in 3614.

## 2016-09-03 NOTE — Progress Notes (Signed)
Referring Provider: [name]* Primary Care Physician:  [name], MD Primary GI: Dr. Gala Romney   Chief Complaint  Patient presents with  . Abdominal Pain  . Constipation    HPI:   Howard [name] is a 83 y.o. male presenting today with a remote history of colon cancer with last TCS in 2014 and surveillance needed in 2019. Followed by Oncology for pancreatic mass suspicious for IPMN, clnically stable on CT abd/pelvis June 2018. Continued surveillance via CT in 12 months. Poor surgical candidate. Dealing with constipation at last visit. Poor historian.   "some days feels 85%, some days 40%". Difficulty sleeping. Rare abdominal pain. States "not as bad as I used to be". Appetite not that great. States he will go 2-3 days and not have a BM till he can have something to break it loose. Taking Miralax daily per medical record.   Past Medical History:  Diagnosis Date  . Adenocarcinoma ([place]) 2003   COLON  . Anemia   . Angina   . Anxiety   . Asthma   . CHF (congestive heart failure) (HCC)    chronic diastolic  . COPD (chronic obstructive pulmonary disease) ([place])   . Coronary artery stenosis    a. 06/2011 Cath: LM 20, LAD min irregs, D1 95 - small,  LCX 60p, RCA 37m, 50d, PLA 50ost;  b. 06/2011 PCI/Rota  RCA  -> 3.0x77mm Promus Element DES  . Dementia 05/27/2016  . Diverticula of colon   . Dysphagia   . Esophageal dysmotility   . GERD (gastroesophageal reflux disease)   . H. pylori infection 03/01/12   treated with prevpac  . Hiatal hernia 08/05/10   egd with Dr. Gala Romney  . Hyperlipidemia   . Hyperplastic polyp of intestine   . Hypertension   . Hypothyroidism   . Internal hemorrhoids   . Ischemic cardiomyopathy    a. 05/2011 Echo: EF 45-50%, inf/post HK  . Kidney stone   . Microscopic colitis   . Mobility impaired    wheelchair dependent  . On supplemental oxygen therapy    oxygen @ 2 l/m nasally  . Osteoarthritis    OF KNEES  . Pancreatic mass 05/28/2015   neoplasm of pancreas  . Pneumonia ~ 2005; 09/2005  . PVD (peripheral vascular disease) ([place])    ABI 0.78 RIGHT AND 0.80 LEFT 4/11  . Schatzki's ring 08/05/10   egd with Dr. Gala Romney  . Shortness of breath 07/17/11   "laying down"  . Tubular adenoma   . Type II diabetes mellitus ([place])     Past Surgical History:  Procedure Laterality Date  . APPENDECTOMY    . ATHERECTOMY  07/17/11  . CATARACT EXTRACTION, BILATERAL    . COLON SURGERY    . COLONOSCOPY  04/2001   Dr. Laural Golden- colon carcinoma  . COLONOSCOPY  09/04/09   Dr. Matthew Folks, hemorrhoids  . COLONOSCOPY  03/11/2012   Dr. Sabino Gasser R hemicolectomy, pancolonic diverticulosis, tubular adenoma, hyperplastic polyp  . CORONARY ANGIOPLASTY  07/17/11   rotablator  . ESOPHAGOGASTRODUODENOSCOPY  04/2001   Dr. Laural Golden  . ESOPHAGOGASTRODUODENOSCOPY  08/05/10   Dr. Evalee Mutton ring, hiatal hernia  . ESOPHAGOGASTRODUODENOSCOPY  03/01/2012   Dr. Gala Romney- gastric erosions, small hiatal hernia, +hpylori,=treated with prevpac  . ESOPHAGOGASTRODUODENOSCOPY N/A 08/11/2013   [id number] Schatzki's ring- s/p dilation as described above. Some retained gastric contents-query delay in gastric emptying. Abnormal gastric mucosa- s/p gastric biopsy (chronic gastritis, intestional metaplasia, no definite h.pylori)  . EUS N/A 06/14/2015  Procedure: UPPER ENDOSCOPIC ULTRASOUND (EUS) LINEAR;  Surgeon: Milus Banister, MD;  [facility];  Service: Endoscopy;  Laterality: N/A;  . HEMICOLECTOMY  2003  . MALONEY DILATION N/A 08/11/2013   Procedure: Venia Minks DILATION;  Surgeon: Daneil Dolin, MD;  [facility];  Service: Endoscopy;  Laterality: N/A;  . PERCUTANEOUS CORONARY STENT INTERVENTION (PCI-S) N/A 07/17/2011   Procedure: PERCUTANEOUS CORONARY STENT INTERVENTION (PCI-S);  Surgeon: Burnell Blanks, MD;  [facility] CATH LAB;  Service: Cardiovascular;  Laterality: N/A;  . SAVORY DILATION N/A 08/11/2013   Procedure: SAVORY DILATION;   Surgeon: Daneil Dolin, MD;  [facility];  Service: Endoscopy;  Laterality: N/A;    Current Outpatient Prescriptions  Medication Sig Dispense Refill  . acetaminophen (TYLENOL) 325 MG tablet Take 650 mg by mouth every 6 (six) hours as needed.    Marland Kitchen albuterol (PROVENTIL) (2.5 MG/3ML) 0.083% nebulizer solution Take 2.5 mg by nebulization every 6 (six) hours.    . budesonide-formoterol (SYMBICORT) 160-4.5 MCG/ACT inhaler Inhale 2 puffs into the lungs 2 (two) times daily.    . carvedilol (COREG) 3.125 MG tablet Take 3.125 mg by mouth 2 (two) times daily with a meal.     . clopidogrel (PLAVIX) 75 MG tablet Take 1 tablet (75 mg total) by mouth daily. 90 tablet 3  . dexlansoprazole (DEXILANT) 60 MG capsule Take 60 mg by mouth daily.    . DULoxetine (CYMBALTA) 60 MG capsule Take 60 mg by mouth daily.    Marland Kitchen gabapentin (NEURONTIN) 100 MG capsule Take 100 mg by mouth at bedtime.    Marland Kitchen guaiFENesin (MUCINEX) 600 MG 12 hr tablet Take 600 mg by mouth 2 (two) times daily.    Marland Kitchen HYDROcodone-acetaminophen (NORCO/VICODIN) 5-325 MG tablet Take 1 tablet by mouth every 8 (eight) hours.    . insulin aspart (NOVOLOG) 100 UNIT/ML injection Inject 5 Units into the skin 3 (three) times daily before meals.    . insulin glargine (LANTUS) 100 UNIT/ML injection Inject 22 Units into the skin at bedtime.     Marland Kitchen ipratropium-albuterol (DUONEB) 0.5-2.5 (3) MG/3ML SOLN Take 3 mLs by nebulization every 6 (six) hours as needed (wheezing/shortness of breathe.).    Marland Kitchen levothyroxine (SYNTHROID, LEVOTHROID) 50 MCG tablet Take 75 mcg by mouth daily before breakfast.     . lisinopril (PRINIVIL,ZESTRIL) 2.5 MG tablet Take 2.5 mg by mouth daily.    Marland Kitchen LORazepam (ATIVAN) 0.5 MG tablet Take 0.5 mg by mouth daily.     . meclizine (ANTIVERT) 12.5 MG tablet Take 12.5 mg by mouth 2 (two) times daily.     . OXYGEN Inhale 2 L into the lungs continuous.    Vladimir Faster Glycol-Propyl Glycol (SYSTANE) 0.4-0.3 % SOLN Apply 1 drop to eye.    .  polyethylene glycol (MIRALAX / GLYCOLAX) packet Take 17 g by mouth daily.    Marland Kitchen Propylene Glycol (SYSTANE BALANCE) 0.6 % SOLN Apply 1 drop to eye 3 (three) times daily.     Marland Kitchen tiotropium (SPIRIVA) 18 MCG inhalation capsule Place 18 mcg into inhaler and inhale daily.    Marland Kitchen torsemide (DEMADEX) 20 MG tablet Take 2 tablets (40 mg total) by mouth daily. 60 tablet 6  . Vitamin D, Ergocalciferol, (DRISDOL) 50000 units CAPS capsule Take 50,000 Units by mouth every 7 (seven) days.    . Cinnamon 500 MG TABS Take 500 mg by mouth daily.    . potassium chloride SA (K-DUR,KLOR-CON) 20 MEQ tablet Take 20 mEq by mouth daily.    Marland Kitchen  temazepam (RESTORIL) 7.5 MG capsule Take 7.5 mg by mouth at bedtime as needed for sleep.     No current facility-administered medications for this visit.     Allergies as of 09/03/2016  . (No Known Allergies)    Family History  Problem Relation Age of Onset  . Coronary artery disease Father   . Heart Problems Mother   . Coronary artery disease Brother        CABG  . Diabetes Brother   . Coronary artery disease Brother     Social History   Social History  . Marital status: Married    Spouse name: N/A  . Number of children: N/A  . Years of education: N/A   Social History Main Topics  . Smoking status: Former Smoker    Years: 20.00    Types: Cigarettes    Quit date: 02/25/2008  . Smokeless tobacco: Never Used  . Alcohol use No  . Drug use: No  . Sexual activity: Not Currently   Other Topics Concern  . None   Social History Narrative   Lives in [place]. Married. Wife in [place].   Son lives with him   Caffeine Use-    Review of Systems: Limited due to some mild confusion   Physical Exam: BP (!) 123/57   Pulse 78   Temp 97.6 F (36.4 C) (Oral)   Ht 5\' 7"  (1.702 m)   Wt 198 lb 12.8 oz (90.2 kg)   BMI 31.14 kg/m  General:   Alert and oriented. No distress noted. Pleasant and cooperative.  Head:  Normocephalic and atraumatic. Eyes:  Conjuctiva clear    Mouth:  Oral mucosa pink and moist.  Abdomen:  +BS, soft, obese, sitting in wheelchair, mild TTP lower abdomen without rebound or guarding Extremities:  Without edema. Neurologic:  Alert and  oriented to person and place. Mild confusion and difficulty understanding my questions at times, but this appears to be more related to hearing deficits  Psych:  Alert and cooperative. Normal mood and affect.

## 2016-09-04 NOTE — Progress Notes (Signed)
cc'ed to pcp °

## 2016-11-03 ENCOUNTER — Other Ambulatory Visit: Payer: Self-pay

## 2016-11-03 DIAGNOSIS — K824 Cholesterolosis of gallbladder: Secondary | ICD-10-CM

## 2016-11-05 ENCOUNTER — Ambulatory Visit: Payer: Medicare Other | Admitting: Gastroenterology

## 2016-11-10 ENCOUNTER — Ambulatory Visit (INDEPENDENT_AMBULATORY_CARE_PROVIDER_SITE_OTHER): Payer: Medicare Other | Admitting: Gastroenterology

## 2016-11-10 ENCOUNTER — Encounter: Payer: Self-pay | Admitting: Gastroenterology

## 2016-11-10 VITALS — BP 124/56 | HR 75 | Temp 97.5°F | Ht 63.0 in | Wt 118.0 lb

## 2016-11-10 DIAGNOSIS — K5900 Constipation, unspecified: Secondary | ICD-10-CM

## 2016-11-10 NOTE — Progress Notes (Signed)
Referring Provider: [name]* Primary Care Physician:  [name], MD Primary GI: Dr. Gala Romney   Chief Complaint  Patient presents with  . Constipation    HPI:   Howard [name] is an 83 y.o. male presenting today with a history of colon cancer with last TCS in 2014 and surveillance needed in 2019. Followed by Oncology for pancreatic mass suspicious for IPMN, clinically stable on CT abd/pelvis June 2018. Continued surveillance via CT in 12 months. Poor surgical candidate. History of constipation.   Started on Linzess 145 mcg once daily at last visit, as Miralax was not working well. I have attempted to call the facility to see how he was doing, but I was unable to speak with nursing staff. He is quite confused today, not sure why he is here. He is unable to tell me much history. Caretaker is with him. From her understanding, he is not having issues. Will need to verify this with staff.   Past Medical History:  Diagnosis Date  . Adenocarcinoma ([place]) 2003   COLON  . Anemia   . Angina   . Anxiety   . Asthma   . CHF (congestive heart failure) (HCC)    chronic diastolic  . COPD (chronic obstructive pulmonary disease) ([place])   . Coronary artery stenosis    a. 06/2011 Cath: LM 20, LAD min irregs, D1 95 - small,  LCX 60p, RCA 59m, 50d, PLA 50ost;  b. 06/2011 PCI/Rota  RCA  -> 3.0x47mm Promus Element DES  . Dementia 05/27/2016  . Diverticula of colon   . Dysphagia   . Esophageal dysmotility   . GERD (gastroesophageal reflux disease)   . H. pylori infection 03/01/12   treated with prevpac  . Hiatal hernia 08/05/10   egd with Dr. Gala Romney  . Hyperlipidemia   . Hyperplastic polyp of intestine   . Hypertension   . Hypothyroidism   . Internal hemorrhoids   . Ischemic cardiomyopathy    a. 05/2011 Echo: EF 45-50%, inf/post HK  . Kidney stone   . Microscopic colitis   . Mobility impaired    wheelchair dependent  . On supplemental oxygen therapy    oxygen @ 2 l/m  nasally  . Osteoarthritis    OF KNEES  . Pancreatic mass 05/28/2015   neoplasm of pancreas  . Pneumonia ~ 2005; 09/2005  . PVD (peripheral vascular disease) ([place])    ABI 0.78 RIGHT AND 0.80 LEFT 4/11  . Schatzki's ring 08/05/10   egd with Dr. Gala Romney  . Shortness of breath 07/17/11   "laying down"  . Tubular adenoma   . Type II diabetes mellitus ([place])     Past Surgical History:  Procedure Laterality Date  . APPENDECTOMY    . ATHERECTOMY  07/17/11  . CATARACT EXTRACTION, BILATERAL    . COLON SURGERY    . COLONOSCOPY  04/2001   Dr. Laural Golden- colon carcinoma  . COLONOSCOPY  09/04/09   Dr. Matthew Folks, hemorrhoids  . COLONOSCOPY  03/11/2012   Dr. Sabino Gasser R hemicolectomy, pancolonic diverticulosis, tubular adenoma, hyperplastic polyp  . CORONARY ANGIOPLASTY  07/17/11   rotablator  . ESOPHAGOGASTRODUODENOSCOPY  04/2001   Dr. Laural Golden  . ESOPHAGOGASTRODUODENOSCOPY  08/05/10   Dr. Evalee Mutton ring, hiatal hernia  . ESOPHAGOGASTRODUODENOSCOPY  03/01/2012   Dr. Gala Romney- gastric erosions, small hiatal hernia, +hpylori,=treated with prevpac  . ESOPHAGOGASTRODUODENOSCOPY N/A 08/11/2013   GBT:DVVOHY Schatzki's ring- s/p dilation as described above. Some retained gastric contents-query delay in gastric emptying. Abnormal gastric  mucosa- s/p gastric biopsy (chronic gastritis, intestional metaplasia, no definite h.pylori)  . EUS N/A 06/14/2015   Procedure: UPPER ENDOSCOPIC ULTRASOUND (EUS) LINEAR;  Surgeon: Milus Banister, MD;  [facility];  Service: Endoscopy;  Laterality: N/A;  . HEMICOLECTOMY  2003  . MALONEY DILATION N/A 08/11/2013   Procedure: Venia Minks DILATION;  Surgeon: Daneil Dolin, MD;  [facility];  Service: Endoscopy;  Laterality: N/A;  . PERCUTANEOUS CORONARY STENT INTERVENTION (PCI-S) N/A 07/17/2011   Procedure: PERCUTANEOUS CORONARY STENT INTERVENTION (PCI-S);  Surgeon: Burnell Blanks, MD;  Location: [facility] CATH LAB;  Service: Cardiovascular;  Laterality: N/A;    . SAVORY DILATION N/A 08/11/2013   Procedure: SAVORY DILATION;  Surgeon: Daneil Dolin, MD;  [facility];  Service: Endoscopy;  Laterality: N/A;    Current Outpatient Prescriptions  Medication Sig Dispense Refill  . acetaminophen (TYLENOL) 325 MG tablet Take 650 mg by mouth every 6 (six) hours as needed.    Marland Kitchen albuterol (PROVENTIL) (2.5 MG/3ML) 0.083% nebulizer solution Take 2.5 mg by nebulization every 6 (six) hours.    . budesonide-formoterol (SYMBICORT) 160-4.5 MCG/ACT inhaler Inhale 2 puffs into the lungs 2 (two) times daily.    . carvedilol (COREG) 3.125 MG tablet Take 3.125 mg by mouth 2 (two) times daily with a meal.     . Cinnamon 500 MG TABS Take 500 mg by mouth daily.    . clopidogrel (PLAVIX) 75 MG tablet Take 1 tablet (75 mg total) by mouth daily. 90 tablet 3  . dexlansoprazole (DEXILANT) 60 MG capsule Take 60 mg by mouth daily.    . DULoxetine (CYMBALTA) 60 MG capsule Take 60 mg by mouth daily.    Marland Kitchen gabapentin (NEURONTIN) 100 MG capsule Take 100 mg by mouth at bedtime.    Marland Kitchen guaiFENesin (MUCINEX) 600 MG 12 hr tablet Take 600 mg by mouth 2 (two) times daily.    Marland Kitchen HYDROcodone-acetaminophen (NORCO/VICODIN) 5-325 MG tablet Take 1 tablet by mouth every 8 (eight) hours.    . insulin aspart (NOVOLOG) 100 UNIT/ML injection Inject 5 Units into the skin 3 (three) times daily before meals.    . insulin glargine (LANTUS) 100 UNIT/ML injection Inject 22 Units into the skin at bedtime.     Marland Kitchen ipratropium-albuterol (DUONEB) 0.5-2.5 (3) MG/3ML SOLN Take 3 mLs by nebulization every 6 (six) hours as needed (wheezing/shortness of breathe.).    Marland Kitchen levothyroxine (SYNTHROID, LEVOTHROID) 50 MCG tablet Take 75 mcg by mouth daily before breakfast.     . lisinopril (PRINIVIL,ZESTRIL) 2.5 MG tablet Take 2.5 mg by mouth daily.    Marland Kitchen LORazepam (ATIVAN) 0.5 MG tablet Take 0.5 mg by mouth daily.     . meclizine (ANTIVERT) 12.5 MG tablet Take 12.5 mg by mouth 2 (two) times daily.     . OXYGEN Inhale 2  L into the lungs continuous.    Vladimir Faster Glycol-Propyl Glycol (SYSTANE) 0.4-0.3 % SOLN Apply 1 drop to eye.    . polyethylene glycol (MIRALAX / GLYCOLAX) packet Take 17 g by mouth daily.    . potassium chloride SA (K-DUR,KLOR-CON) 20 MEQ tablet Take 20 mEq by mouth daily.    Marland Kitchen Propylene Glycol (SYSTANE BALANCE) 0.6 % SOLN Apply 1 drop to eye 3 (three) times daily.     . temazepam (RESTORIL) 7.5 MG capsule Take 7.5 mg by mouth at bedtime as needed for sleep.    Marland Kitchen tiotropium (SPIRIVA) 18 MCG inhalation capsule Place 18 mcg into inhaler and inhale daily.    Marland Kitchen  torsemide (DEMADEX) 20 MG tablet Take 2 tablets (40 mg total) by mouth daily. 60 tablet 6  . Vitamin D, Ergocalciferol, (DRISDOL) 50000 units CAPS capsule Take 50,000 Units by mouth every 7 (seven) days.     No current facility-administered medications for this visit.     Allergies as of 11/10/2016  . (No Known Allergies)    Family History  Problem Relation Age of Onset  . Coronary artery disease Father   . Heart Problems Mother   . Coronary artery disease Brother        CABG  . Diabetes Brother   . Coronary artery disease Brother     Social History   Social History  . Marital status: Married    Spouse name: N/A  . Number of children: N/A  . Years of education: N/A   Social History Main Topics  . Smoking status: Former Smoker    Years: 20.00    Types: Cigarettes    Quit date: 02/25/2008  . Smokeless tobacco: Never Used  . Alcohol use No  . Drug use: No  . Sexual activity: Not Currently   Other Topics Concern  . None   Social History Narrative   Lives in [place]. Mar[place]. Wife in [place].   Son lives with him   Caffeine Use-    Review of Systems: Limited due to cognitive status   Physical Exam: BP (!) 124/56   Pulse 75   Temp (!) 97.5 F (36.4 C) (Oral)   Ht 5\' 3"  (1.6 m)   Wt 118 lb (53.5 kg)   BMI 20.90 kg/m  General:   Alert and oriented to person only . No distress noted. Head:  Normocephalic and  atraumatic. Eyes:  Conjuctiva clear without scleral icterus. Mouth:  Oral mucosa pink and moist.  Abdomen:  +BS, obese, sitting in wheelchair, no TTP, rebound, or guarding Neurologic:  Alert and  oriented to person only  Psych:  Alert and cooperative. Normal mood and affect.

## 2016-11-10 NOTE — Patient Instructions (Signed)
Continue Linzess 145 microgram capsule once each morning, 30 minutes before breakfast.  We will see you in 6 months!

## 2016-11-11 ENCOUNTER — Encounter (HOSPITAL_COMMUNITY): Payer: Medicare Other | Attending: Oncology

## 2016-11-11 ENCOUNTER — Encounter (HOSPITAL_COMMUNITY): Payer: Self-pay

## 2016-11-11 ENCOUNTER — Encounter (HOSPITAL_BASED_OUTPATIENT_CLINIC_OR_DEPARTMENT_OTHER): Payer: Medicare Other | Admitting: Oncology

## 2016-11-11 VITALS — BP 134/61 | HR 87 | Temp 97.6°F | Resp 18

## 2016-11-11 DIAGNOSIS — D631 Anemia in chronic kidney disease: Secondary | ICD-10-CM | POA: Insufficient documentation

## 2016-11-11 DIAGNOSIS — Z9981 Dependence on supplemental oxygen: Secondary | ICD-10-CM | POA: Diagnosis not present

## 2016-11-11 DIAGNOSIS — Z9842 Cataract extraction status, left eye: Secondary | ICD-10-CM | POA: Diagnosis not present

## 2016-11-11 DIAGNOSIS — I509 Heart failure, unspecified: Secondary | ICD-10-CM | POA: Insufficient documentation

## 2016-11-11 DIAGNOSIS — E785 Hyperlipidemia, unspecified: Secondary | ICD-10-CM | POA: Insufficient documentation

## 2016-11-11 DIAGNOSIS — N183 Chronic kidney disease, stage 3 unspecified: Secondary | ICD-10-CM

## 2016-11-11 DIAGNOSIS — Z87442 Personal history of urinary calculi: Secondary | ICD-10-CM | POA: Diagnosis not present

## 2016-11-11 DIAGNOSIS — D539 Nutritional anemia, unspecified: Secondary | ICD-10-CM | POA: Insufficient documentation

## 2016-11-11 DIAGNOSIS — Z955 Presence of coronary angioplasty implant and graft: Secondary | ICD-10-CM | POA: Insufficient documentation

## 2016-11-11 DIAGNOSIS — F0390 Unspecified dementia without behavioral disturbance: Secondary | ICD-10-CM

## 2016-11-11 DIAGNOSIS — E1151 Type 2 diabetes mellitus with diabetic peripheral angiopathy without gangrene: Secondary | ICD-10-CM | POA: Insufficient documentation

## 2016-11-11 DIAGNOSIS — J449 Chronic obstructive pulmonary disease, unspecified: Secondary | ICD-10-CM | POA: Diagnosis not present

## 2016-11-11 DIAGNOSIS — Z9841 Cataract extraction status, right eye: Secondary | ICD-10-CM | POA: Insufficient documentation

## 2016-11-11 DIAGNOSIS — Z85038 Personal history of other malignant neoplasm of large intestine: Secondary | ICD-10-CM | POA: Insufficient documentation

## 2016-11-11 DIAGNOSIS — M1990 Unspecified osteoarthritis, unspecified site: Secondary | ICD-10-CM | POA: Diagnosis not present

## 2016-11-11 DIAGNOSIS — I130 Hypertensive heart and chronic kidney disease with heart failure and stage 1 through stage 4 chronic kidney disease, or unspecified chronic kidney disease: Secondary | ICD-10-CM | POA: Insufficient documentation

## 2016-11-11 DIAGNOSIS — D509 Iron deficiency anemia, unspecified: Secondary | ICD-10-CM | POA: Diagnosis not present

## 2016-11-11 DIAGNOSIS — Z87891 Personal history of nicotine dependence: Secondary | ICD-10-CM | POA: Insufficient documentation

## 2016-11-11 DIAGNOSIS — Z9889 Other specified postprocedural states: Secondary | ICD-10-CM | POA: Diagnosis not present

## 2016-11-11 DIAGNOSIS — D649 Anemia, unspecified: Secondary | ICD-10-CM

## 2016-11-11 DIAGNOSIS — R51 Headache: Secondary | ICD-10-CM

## 2016-11-11 DIAGNOSIS — K869 Disease of pancreas, unspecified: Secondary | ICD-10-CM | POA: Diagnosis present

## 2016-11-11 DIAGNOSIS — Z993 Dependence on wheelchair: Secondary | ICD-10-CM | POA: Insufficient documentation

## 2016-11-11 DIAGNOSIS — E1122 Type 2 diabetes mellitus with diabetic chronic kidney disease: Secondary | ICD-10-CM | POA: Diagnosis not present

## 2016-11-11 DIAGNOSIS — I255 Ischemic cardiomyopathy: Secondary | ICD-10-CM | POA: Insufficient documentation

## 2016-11-11 DIAGNOSIS — I2510 Atherosclerotic heart disease of native coronary artery without angina pectoris: Secondary | ICD-10-CM | POA: Diagnosis not present

## 2016-11-11 DIAGNOSIS — K8689 Other specified diseases of pancreas: Secondary | ICD-10-CM

## 2016-11-11 DIAGNOSIS — E039 Hypothyroidism, unspecified: Secondary | ICD-10-CM | POA: Insufficient documentation

## 2016-11-11 DIAGNOSIS — Z8249 Family history of ischemic heart disease and other diseases of the circulatory system: Secondary | ICD-10-CM | POA: Insufficient documentation

## 2016-11-11 DIAGNOSIS — R109 Unspecified abdominal pain: Secondary | ICD-10-CM

## 2016-11-11 DIAGNOSIS — Z833 Family history of diabetes mellitus: Secondary | ICD-10-CM | POA: Insufficient documentation

## 2016-11-11 LAB — IRON AND TIBC
IRON: 50 ug/dL (ref 45–182)
SATURATION RATIOS: 15 % — AB (ref 17.9–39.5)
TIBC: 323 ug/dL (ref 250–450)
UIBC: 273 ug/dL

## 2016-11-11 LAB — CBC WITH DIFFERENTIAL/PLATELET
Basophils Absolute: 0 10*3/uL (ref 0.0–0.1)
Basophils Relative: 0 %
EOS ABS: 0.4 10*3/uL (ref 0.0–0.7)
Eosinophils Relative: 4 %
HCT: 30.3 % — ABNORMAL LOW (ref 39.0–52.0)
HEMOGLOBIN: 9.9 g/dL — AB (ref 13.0–17.0)
LYMPHS PCT: 12 %
Lymphs Abs: 1.2 10*3/uL (ref 0.7–4.0)
MCH: 33.7 pg (ref 26.0–34.0)
MCHC: 32.7 g/dL (ref 30.0–36.0)
MCV: 103.1 fL — AB (ref 78.0–100.0)
Monocytes Absolute: 1.1 10*3/uL — ABNORMAL HIGH (ref 0.1–1.0)
Monocytes Relative: 11 %
NEUTROS PCT: 73 %
Neutro Abs: 7.5 10*3/uL (ref 1.7–7.7)
Platelets: 201 10*3/uL (ref 150–400)
RBC: 2.94 MIL/uL — AB (ref 4.22–5.81)
RDW: 15.1 % (ref 11.5–15.5)
WBC: 10.2 10*3/uL (ref 4.0–10.5)

## 2016-11-11 LAB — COMPREHENSIVE METABOLIC PANEL
ALBUMIN: 3.5 g/dL (ref 3.5–5.0)
ALT: 14 U/L — ABNORMAL LOW (ref 17–63)
ANION GAP: 9 (ref 5–15)
AST: 18 U/L (ref 15–41)
Alkaline Phosphatase: 61 U/L (ref 38–126)
BILIRUBIN TOTAL: 0.3 mg/dL (ref 0.3–1.2)
BUN: 25 mg/dL — ABNORMAL HIGH (ref 6–20)
CO2: 30 mmol/L (ref 22–32)
Calcium: 8.9 mg/dL (ref 8.9–10.3)
Chloride: 99 mmol/L — ABNORMAL LOW (ref 101–111)
Creatinine, Ser: 1.41 mg/dL — ABNORMAL HIGH (ref 0.61–1.24)
GFR calc Af Amer: 51 mL/min — ABNORMAL LOW (ref >60)
GFR calc non Af Amer: 44 mL/min — ABNORMAL LOW (ref >60)
GLUCOSE: 160 mg/dL — AB (ref 65–99)
POTASSIUM: 4.1 mmol/L (ref 3.5–5.1)
Sodium: 138 mmol/L (ref 135–145)
TOTAL PROTEIN: 6.5 g/dL (ref 6.5–8.1)

## 2016-11-11 LAB — FOLATE: FOLATE: 19.4 ng/mL (ref >5.9)

## 2016-11-11 LAB — FERRITIN: Ferritin: 58 ng/mL (ref 24–336)

## 2016-11-11 LAB — VITAMIN B12: Vitamin B-12: 649 pg/mL (ref 180–914)

## 2016-11-11 NOTE — Progress Notes (Signed)
Howard Corrigan, MD [address] [place] [address]  Pancreatic mass - Plan: Cancer antigen 19-9  Anemia in stage 3 chronic kidney disease - Plan: CBC with Differential, Comprehensive metabolic panel, Iron and TIBC, Erythropoietin, Ferritin, Vitamin B12, Cancer antigen 19-9  CURRENT THERAPY:  INTERVAL HISTORY: Howard [name] 83 y.o. 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 AND Anemia with macrocytosis AND Leukocytosis, dating back to at least 2014 AND Dementia.  [name] presents today for continued follow-up. Patient is accompanied by an aide from his nursing home. Patient states that he has not been eating much, however his aid states that patient eats all the time. Patient has been also been complaining about intermittent headaches. As per his aide, she states that patient's dementia has gotten progressively worse in the last few months. He denies any chest pain, shortness breath, focal weakness. He does complain of abdominal pain which he states is "all the time".  Review of Systems  Constitutional: Negative for chills, fever and weight loss.  HENT: Negative.   Eyes: Negative.   Respiratory: Negative.  Negative for cough, hemoptysis and shortness of breath.   Cardiovascular: Negative.  Negative for chest pain and leg swelling.  Gastrointestinal: Positive for abdominal pain. Negative for blood in stool, constipation, diarrhea, melena, nausea and vomiting.  Genitourinary: Negative.   Musculoskeletal: Negative for joint pain.  Skin: Negative.   Neurological: Positive for headaches. Negative for weakness.  Endo/Heme/Allergies: Negative.   Psychiatric/Behavioral: Negative.     Past Medical History:  Diagnosis Date  . Adenocarcinoma ([place]) 2003   COLON  . Anemia   . Angina   . Anxiety   . Asthma   . CHF (congestive heart failure) (HCC)    chronic diastolic  . COPD (chronic  obstructive pulmonary disease) ([place])   . Coronary artery stenosis    a. 06/2011 Cath: LM 20, LAD min irregs, D1 95 - small,  LCX 60p, RCA 27m, 50d, PLA 50ost;  b. 06/2011 PCI/Rota  RCA  -> 3.0x37mm Promus Element DES  . Dementia 05/27/2016  . Diverticula of colon   . Dysphagia   . Esophageal dysmotility   . GERD (gastroesophageal reflux disease)   . H. pylori infection 03/01/12   treated with prevpac  . Hiatal hernia 08/05/10   egd with Dr. Gala Romney  . Hyperlipidemia   . Hyperplastic polyp of intestine   . Hypertension   . Hypothyroidism   . Internal hemorrhoids   . Ischemic cardiomyopathy    a. 05/2011 Echo: EF 45-50%, inf/post HK  . Kidney stone   . Microscopic colitis   . Mobility impaired    wheelchair dependent  . On supplemental oxygen therapy    oxygen @ 2 l/m nasally  . Osteoarthritis    OF KNEES  . Pancreatic mass 05/28/2015   neoplasm of pancreas  . Pneumonia ~ 2005; 09/2005  . PVD (peripheral vascular disease) ([place])    ABI 0.78 RIGHT AND 0.80 LEFT 4/11  . Schatzki's ring 08/05/10   egd with Dr. Gala Romney  . Shortness of breath 07/17/11   "laying down"  . Tubular adenoma   . Type II diabetes mellitus ([place])     Past Surgical History:  Procedure Laterality Date  . APPENDECTOMY    . ATHERECTOMY  07/17/11  . CATARACT EXTRACTION, BILATERAL    . COLON SURGERY    . COLONOSCOPY  04/2001   Dr. Laural Golden- colon  carcinoma  . COLONOSCOPY  09/04/09   Dr. Matthew Folks, hemorrhoids  . COLONOSCOPY  03/11/2012   Dr. Sabino Gasser R hemicolectomy, pancolonic diverticulosis, tubular adenoma, hyperplastic polyp  . CORONARY ANGIOPLASTY  07/17/11   rotablator  . ESOPHAGOGASTRODUODENOSCOPY  04/2001   Dr. Laural Golden  . ESOPHAGOGASTRODUODENOSCOPY  08/05/10   Dr. Evalee Mutton ring, hiatal hernia  . ESOPHAGOGASTRODUODENOSCOPY  03/01/2012   Dr. Gala Romney- gastric erosions, small hiatal hernia, +hpylori,=treated with prevpac  . ESOPHAGOGASTRODUODENOSCOPY N/A 08/11/2013   DXA:JOINOM Schatzki's ring- s/p dilation  as described above. Some retained gastric contents-query delay in gastric emptying. Abnormal gastric mucosa- s/p gastric biopsy (chronic gastritis, intestional metaplasia, no definite h.pylori)  . EUS N/A 06/14/2015   Procedure: UPPER ENDOSCOPIC ULTRASOUND (EUS) LINEAR;  Surgeon: Milus Banister, MD;  [facility];  Service: Endoscopy;  Laterality: N/A;  . HEMICOLECTOMY  2003  . MALONEY DILATION N/A 08/11/2013   Procedure: Venia Minks DILATION;  Surgeon: Daneil Dolin, MD;  [facility];  Service: Endoscopy;  Laterality: N/A;  . PERCUTANEOUS CORONARY STENT INTERVENTION (PCI-S) N/A 07/17/2011   Procedure: PERCUTANEOUS CORONARY STENT INTERVENTION (PCI-S);  Surgeon: Burnell Blanks, MD;  Location: [facility] CATH LAB;  Service: Cardiovascular;  Laterality: N/A;  . SAVORY DILATION N/A 08/11/2013   Procedure: SAVORY DILATION;  Surgeon: Daneil Dolin, MD;  [facility];  Service: Endoscopy;  Laterality: N/A;    Family History  Problem Relation Age of Onset  . Coronary artery disease Father   . Heart Problems Mother   . Coronary artery disease Brother        CABG  . Diabetes Brother   . Coronary artery disease Brother     Social History   Social History  . Marital status: Married    Spouse name: N/A  . Number of children: N/A  . Years of education: N/A   Social History Main Topics  . Smoking status: Former Smoker    Years: 20.00    Types: Cigarettes    Quit date: 02/25/2008  . Smokeless tobacco: Never Used  . Alcohol use No  . Drug use: No  . Sexual activity: Not Currently   Other Topics Concern  . None   Social History Narrative   Lives in [place]. Married. Wife in [place].   Son lives with him   Caffeine Use-     PHYSICAL EXAMINATION  ECOG PERFORMANCE STATUS: 2 - Symptomatic, <50% confined to bed  Physical Exam  Constitutional: He is well-developed, well-nourished, and in no distress. No distress.  In wheelchair, nasal oxygen   HENT:  Head:  Normocephalic and atraumatic.  Mouth/Throat: No oropharyngeal exudate.  Eyes: Pupils are equal, round, and reactive to light. Conjunctivae are normal. No scleral icterus.  Neck: Normal range of motion. Neck supple. No JVD present.  Cardiovascular: Normal rate, regular rhythm and normal heart sounds.  Exam reveals no gallop and no friction rub.   No murmur heard. Pulmonary/Chest: Breath sounds normal. No respiratory distress. He has no wheezes. He has no rales.  Abdominal: Soft. Bowel sounds are normal. He exhibits no distension. There is tenderness (mid-abdomen). There is no guarding.  Musculoskeletal: He exhibits no edema or tenderness.  Lymphadenopathy:    He has no cervical adenopathy.  Neurological: He is alert. No cranial nerve deficit.  Not oriented to time  Skin: Skin is warm and dry. No rash noted. No erythema. No pallor.  Psychiatric: Affect normal.  Judgement is not normal.     LABORATORY DATA: CBC  Component Value Date/Time   WBC 10.2 11/11/2016 1401   RBC 2.94 (L) 11/11/2016 1401   HGB 9.9 (L) 11/11/2016 1401   HGB 11.5 11/04/2012   HCT 30.3 (L) 11/11/2016 1401   HCT 35 11/04/2012   PLT 201 11/11/2016 1401   MCV 103.1 (H) 11/11/2016 1401   MCV 98.3 11/04/2012   MCH 33.7 11/11/2016 1401   MCHC 32.7 11/11/2016 1401   RDW 15.1 11/11/2016 1401   LYMPHSABS 1.2 11/11/2016 1401   MONOABS 1.1 (H) 11/11/2016 1401   EOSABS 0.4 11/11/2016 1401   BASOSABS 0.0 11/11/2016 1401  CA 19-9  Cancer antigen 19-9  Collected: 02/27/16 1318  Resulting lab: [facility]  Reference range: 0 - 35 U/mL  Value: 50 (High)  Comment: (NOTE)  Roche ECLIA methodology  Performed At: [facility]  [place], [place] [id number]  Lindon Romp MD AL:[id number]   *Additional information available - comment   Cancer antigen 19-9 (Order [id number])  Cancer antigen 19-9  Order: [id number]  Status:  Final result Visible to patient:  No (Not Released) Next appt:   09/03/2016 at 10:00 AM in Gastroenterology Annitta Needs, NP) Dx:  Goals of care, counseling/discussion  Newer results are available. Click to view them now.    Ref Range & Units 61mo ago 58mo ago 53yr ago   CA 19-9 0 - 35 U/mL 50   40CM   46CM    Comment: (NOTE)  Roche ECLIA methodology  Performed At: [facility]  [place], [place] [id number]  Lindon Romp MD [id number]   Resulting Agency  [facility]    Specimen Collected: 02/27/16 13:18 Last Resulted: 02/28/16 07:37            CM=Additional comments        Other Results from 02/27/2016       Chemistry      Component Value Date/Time   NA 138 11/11/2016 1401   K 4.1 11/11/2016 1401   K 4.5 11/08/2012   CL 99 (L) 11/11/2016 1401   CO2 30 11/11/2016 1401   BUN 25 (H) 11/11/2016 1401   BUN 23 (A) 11/08/2012   CREATININE 1.41 (H) 11/11/2016 1401   CREATININE 0.85 07/07/2011 1419      Component Value Date/Time   CALCIUM 8.9 11/11/2016 1401   ALKPHOS 61 11/11/2016 1401   ALKPHOS 83 11/04/2012   AST 18 11/11/2016 1401   AST 19 11/04/2012   ALT 14 (L) 11/11/2016 1401   BILITOT 0.3 11/11/2016 1401   BILITOT 0.5 11/04/2012         ASSESSMENT AND PLAN:  Pancreatic mass 1. 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 Clinically stable on recent CT abd/pelvis on 07/29/16. Continue obervation with repeat MRI or CT abd w/ and w/o contrast in 12 months (june 2019), preferably MRI. Patient is a poor surgical candidate.   2. Macrocytic anemia- may be multifactorial from CKD as well. Hemoglobin stable at 9.9 g/dL today. Will defer to his nephrologist about when is the best time to start ESA. RTC in 6 months with repeat labs as stated below.  Orders Placed This Encounter  Procedures  . CBC with Differential    Standing Status:   Future    Standing Expiration Date:   11/11/2017  .  Comprehensive metabolic panel    Standing Status:   Future    Standing  Expiration Date:   11/11/2017  . Iron and TIBC    Standing Status:   Future    Standing Expiration Date:   11/11/2017  . Erythropoietin    Standing Status:   Future    Standing Expiration Date:   11/11/2017  . Ferritin    Standing Status:   Future    Standing Expiration Date:   11/11/2017  . Vitamin B12    Standing Status:   Future    Standing Expiration Date:   11/11/2017  . Cancer antigen 19-9    Standing Status:   Future    Standing Expiration Date:   11/11/2017    This note is electronically signed by: Twana First, MD 11/11/2016 2:49 PM

## 2016-11-12 LAB — ERYTHROPOIETIN: Erythropoietin: 46.6 m[IU]/mL — ABNORMAL HIGH (ref 2.6–18.5)

## 2016-11-13 LAB — IMMUNOFIXATION ELECTROPHORESIS
IGG (IMMUNOGLOBIN G), SERUM: 994 mg/dL (ref 700–1600)
IGM (IMMUNOGLOBULIN M), SRM: 89 mg/dL (ref 15–143)
IgA: 201 mg/dL (ref 61–437)
TOTAL PROTEIN ELP: 6 g/dL (ref 6.0–8.5)

## 2016-11-13 NOTE — Progress Notes (Signed)
cc'ed to pcp °

## 2016-11-13 NOTE — Assessment & Plan Note (Signed)
Continue Linzess 145 mcg once daily. Return in 6 months. Colonoscopy if health permits in [id number].

## 2016-11-25 ENCOUNTER — Ambulatory Visit (HOSPITAL_COMMUNITY): Admission: RE | Admit: 2016-11-25 | Payer: Medicare Other | Source: Ambulatory Visit

## 2016-11-27 ENCOUNTER — Ambulatory Visit (HOSPITAL_COMMUNITY)
Admission: RE | Admit: 2016-11-27 | Discharge: 2016-11-27 | Disposition: A | Discharge status: Home / Self Care | Payer: Medicare Other | Source: Ambulatory Visit | Attending: Internal Medicine | Admitting: Internal Medicine

## 2016-11-27 DIAGNOSIS — R932 Abnormal findings on diagnostic imaging of liver and biliary tract: Secondary | ICD-10-CM | POA: Diagnosis not present

## 2016-11-27 DIAGNOSIS — K8020 Calculus of gallbladder without cholecystitis without obstruction: Secondary | ICD-10-CM | POA: Insufficient documentation

## 2016-11-27 DIAGNOSIS — K824 Cholesterolosis of gallbladder: Secondary | ICD-10-CM | POA: Diagnosis present

## 2016-12-16 ENCOUNTER — Other Ambulatory Visit (HOSPITAL_COMMUNITY): Payer: Medicare Other

## 2016-12-19 NOTE — Progress Notes (Signed)
Stable ultrasound findings. This ultrasound was requested for follow-up of possible gallbladder polyp. Repeat in 1 year.

## 2016-12-23 ENCOUNTER — Ambulatory Visit (HOSPITAL_COMMUNITY): Payer: Medicare Other

## 2017-02-05 ENCOUNTER — Encounter: Payer: Self-pay | Admitting: Internal Medicine

## 2017-05-11 ENCOUNTER — Encounter: Payer: Self-pay | Admitting: Gastroenterology

## 2017-05-11 ENCOUNTER — Ambulatory Visit: Payer: Medicare Other | Admitting: Gastroenterology

## 2017-05-11 ENCOUNTER — Telehealth: Payer: Self-pay | Admitting: Gastroenterology

## 2017-05-11 NOTE — Telephone Encounter (Signed)
PATIENT WAS A NO SHOW AND LETTER SENT  °

## 2017-05-14 ENCOUNTER — Encounter (HOSPITAL_COMMUNITY): Payer: Self-pay | Admitting: Internal Medicine

## 2017-05-14 ENCOUNTER — Inpatient Hospital Stay (HOSPITAL_COMMUNITY): Payer: Medicare Other | Attending: Internal Medicine

## 2017-05-14 ENCOUNTER — Inpatient Hospital Stay (HOSPITAL_BASED_OUTPATIENT_CLINIC_OR_DEPARTMENT_OTHER): Payer: Medicare Other | Admitting: Internal Medicine

## 2017-05-14 ENCOUNTER — Other Ambulatory Visit: Payer: Self-pay

## 2017-05-14 DIAGNOSIS — Z79899 Other long term (current) drug therapy: Secondary | ICD-10-CM

## 2017-05-14 DIAGNOSIS — F0390 Unspecified dementia without behavioral disturbance: Secondary | ICD-10-CM

## 2017-05-14 DIAGNOSIS — E119 Type 2 diabetes mellitus without complications: Secondary | ICD-10-CM | POA: Insufficient documentation

## 2017-05-14 DIAGNOSIS — K5900 Constipation, unspecified: Secondary | ICD-10-CM | POA: Insufficient documentation

## 2017-05-14 DIAGNOSIS — K219 Gastro-esophageal reflux disease without esophagitis: Secondary | ICD-10-CM | POA: Insufficient documentation

## 2017-05-14 DIAGNOSIS — N183 Chronic kidney disease, stage 3 unspecified: Secondary | ICD-10-CM

## 2017-05-14 DIAGNOSIS — E785 Hyperlipidemia, unspecified: Secondary | ICD-10-CM | POA: Diagnosis not present

## 2017-05-14 DIAGNOSIS — I5032 Chronic diastolic (congestive) heart failure: Secondary | ICD-10-CM | POA: Diagnosis not present

## 2017-05-14 DIAGNOSIS — I429 Cardiomyopathy, unspecified: Secondary | ICD-10-CM

## 2017-05-14 DIAGNOSIS — Z794 Long term (current) use of insulin: Secondary | ICD-10-CM | POA: Diagnosis not present

## 2017-05-14 DIAGNOSIS — N189 Chronic kidney disease, unspecified: Secondary | ICD-10-CM | POA: Diagnosis not present

## 2017-05-14 DIAGNOSIS — Z85038 Personal history of other malignant neoplasm of large intestine: Secondary | ICD-10-CM

## 2017-05-14 DIAGNOSIS — R1907 Generalized intra-abdominal and pelvic swelling, mass and lump: Secondary | ICD-10-CM | POA: Diagnosis present

## 2017-05-14 DIAGNOSIS — I2510 Atherosclerotic heart disease of native coronary artery without angina pectoris: Secondary | ICD-10-CM

## 2017-05-14 DIAGNOSIS — D539 Nutritional anemia, unspecified: Secondary | ICD-10-CM | POA: Insufficient documentation

## 2017-05-14 DIAGNOSIS — I255 Ischemic cardiomyopathy: Secondary | ICD-10-CM | POA: Insufficient documentation

## 2017-05-14 DIAGNOSIS — Z8619 Personal history of other infectious and parasitic diseases: Secondary | ICD-10-CM | POA: Diagnosis not present

## 2017-05-14 DIAGNOSIS — K8689 Other specified diseases of pancreas: Secondary | ICD-10-CM

## 2017-05-14 DIAGNOSIS — D631 Anemia in chronic kidney disease: Secondary | ICD-10-CM

## 2017-05-14 LAB — CBC WITH DIFFERENTIAL/PLATELET
BASOS ABS: 0 10*3/uL (ref 0.0–0.1)
Basophils Relative: 0 %
Eosinophils Absolute: 0.8 10*3/uL — ABNORMAL HIGH (ref 0.0–0.7)
Eosinophils Relative: 9 %
HEMATOCRIT: 32.9 % — AB (ref 39.0–52.0)
Hemoglobin: 9.9 g/dL — ABNORMAL LOW (ref 13.0–17.0)
LYMPHS ABS: 1.3 10*3/uL (ref 0.7–4.0)
LYMPHS PCT: 15 %
MCH: 31.4 pg (ref 26.0–34.0)
MCHC: 30.1 g/dL (ref 30.0–36.0)
MCV: 104.4 fL — AB (ref 78.0–100.0)
Monocytes Absolute: 1.1 10*3/uL — ABNORMAL HIGH (ref 0.1–1.0)
Monocytes Relative: 12 %
NEUTROS ABS: 5.5 10*3/uL (ref 1.7–7.7)
Neutrophils Relative %: 64 %
Platelets: 192 10*3/uL (ref 150–400)
RBC: 3.15 MIL/uL — AB (ref 4.22–5.81)
RDW: 15.7 % — ABNORMAL HIGH (ref 11.5–15.5)
WBC: 8.7 10*3/uL (ref 4.0–10.5)

## 2017-05-14 LAB — COMPREHENSIVE METABOLIC PANEL
ALK PHOS: 70 U/L (ref 38–126)
ALT: 14 U/L — AB (ref 17–63)
AST: 19 U/L (ref 15–41)
Albumin: 3.6 g/dL (ref 3.5–5.0)
Anion gap: 10 (ref 5–15)
BILIRUBIN TOTAL: 0.5 mg/dL (ref 0.3–1.2)
BUN: 30 mg/dL — AB (ref 6–20)
CALCIUM: 9.2 mg/dL (ref 8.9–10.3)
CO2: 31 mmol/L (ref 22–32)
Chloride: 96 mmol/L — ABNORMAL LOW (ref 101–111)
Creatinine, Ser: 1.27 mg/dL — ABNORMAL HIGH (ref 0.61–1.24)
GFR calc Af Amer: 57 mL/min — ABNORMAL LOW (ref >60)
GFR, EST NON AFRICAN AMERICAN: 49 mL/min — AB (ref >60)
Glucose, Bld: 136 mg/dL — ABNORMAL HIGH (ref 65–99)
Potassium: 4 mmol/L (ref 3.5–5.1)
Sodium: 137 mmol/L (ref 135–145)
TOTAL PROTEIN: 6.9 g/dL (ref 6.5–8.1)

## 2017-05-14 LAB — FERRITIN: FERRITIN: 50 ng/mL (ref 24–336)

## 2017-05-14 LAB — IRON AND TIBC
IRON: 58 ug/dL (ref 45–182)
Saturation Ratios: 17 % — ABNORMAL LOW (ref 17.9–39.5)
TIBC: 347 ug/dL (ref 250–450)
UIBC: 289 ug/dL

## 2017-05-14 LAB — VITAMIN B12: Vitamin B-12: 604 pg/mL (ref 180–914)

## 2017-05-14 NOTE — Patient Instructions (Addendum)
[place] at [facility] Discharge Instructions  You were seen today by Dr. Walden Field. She went over your recent lab results and everything looked normal. You will need repeat scans in June and we will get you scheduled for those. Return in June for labs and follow up to review that scan result .    Thank you for choosing [place] at [facility] to provide your oncology and hematology care.  To afford each patient quality time with our provider, please arrive at least 15 minutes before your scheduled appointment time.   If you have a lab appointment with the [facility] please come in thru the  Main Entrance and check in at the main information desk  You need to re-schedule your appointment should you arrive 10 or more minutes late.  We strive to give you quality time with our providers, and arriving late affects you and other patients whose appointments are after yours.  Also, if you no show three or more times for appointments you may be dismissed from the clinic at the providers discretion.     Again, thank you for choosing [facility].  Our hope is that these requests will decrease the amount of time that you wait before being seen by our physicians.       _____________________________________________________________  Should you have questions after your visit to [facility], please contact our office at [phone] between the hours of 8:30 a.m. and 4:30 p.m.  Voicemails left after 4:30 p.m. will not be returned until the following business day.  For prescription refill requests, have your pharmacy contact our office.       Resources For Cancer Patients and their Caregivers ? American Cancer Society: Can assist with transportation, wigs, general needs, runs Look Good Feel Better.        6788440338 ? Cancer Care: Provides financial assistance, online support groups, medication/co-pay assistance.   1-800-813-HOPE 276-747-4888) ? [facility] Assists [place] cancer patients and their families through emotional , educational and financial support.  205-142-1634 ? [place] DSS Where to apply for food stamps, Medicaid and utility assistance. 404-422-7029 ? [facility]: Transportation to medical appointments. (978)229-3697 ? Social Security Administration: May apply for disability if have a Stage IV cancer. 5670323639 215-503-6854 ? [employer], Disability and Transit Services: Assists with nutrition, care and transit needs. [place] Support Programs:   > Cancer Support Group  2nd Tuesday of the month 1pm-2pm, Journey Room   > Creative Journey  3rd Tuesday of the month 1130am-1pm, Journey Room

## 2017-05-15 LAB — ERYTHROPOIETIN: Erythropoietin: 103.4 m[IU]/mL — ABNORMAL HIGH (ref 2.6–18.5)

## 2017-05-15 LAB — CANCER ANTIGEN 19-9: CA 19-9: 45 U/mL — ABNORMAL HIGH (ref 0–35)

## 2017-06-16 ENCOUNTER — Ambulatory Visit: Payer: Medicare Other | Admitting: Gastroenterology

## 2017-07-15 NOTE — Progress Notes (Signed)
Diagnosis Generalized intra-abdominal and pelvic swelling, mass and lump - Plan: CT Abdomen Pelvis W Wo Contrast, CT Chest W Contrast  Staging Cancer Staging No matching staging information was found for the patient.  Assessment and Plan:  1. 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  Pt was previously followed by Dr. Talbert Cage.  Clinically stable on CT abd/pelvis on 07/29/16. He will be set up for CT CAP in 07/2017 for interval follow-up.  He will RTC to go over results.   Patient is a poor surgical candidate.   2. Macrocytic anemia. Felt multifactorial from CKD.  Labs done 05/14/2017 showed WBC 8.7 hb 9.9 plts 192,000.   Cr 1.27.  Follow up with nephrologist about if  ESA indicated. RTC in 6 months with repeat labs as stated below.  Interval History:  84 yr old male previously followed by Dr. 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 Stable on recent CT abd/pelvis on 07/29/16.   Current Status:  Pt is seen today for follow-up.  He is here to go over labs  Problem List Patient Active Problem List   Diagnosis Date Noted  . Dementia [F03.90] 05/27/2016  . Constipation [K59.00] 03/06/2016  . Goals of care, counseling/discussion [Z71.89] 11/27/2015  . Pancreatic mass [K86.9] 05/28/2015  . Abdominal pain [R10.9] 04/19/2015  . HCAP (healthcare-associated pneumonia) [J18.9] 12/03/2013  . Acute on chronic respiratory failure ([place]) [J96.20] 12/03/2013  . Toxic metabolic encephalopathy [C37] 12/03/2013  . Chronic diastolic CHF (congestive heart failure) ([place]) [I50.32] 12/03/2013  . Leukocytosis [D72.829] 12/03/2013  . GERD (gastroesophageal reflux disease) [K21.9] 11/17/2012  . Fall at home [W19.Merril Abbe, S28.315] 08/18/2012  . Generalized weakness [R53.1] 08/18/2012   . Dehydration [E86.0] 08/18/2012  . Melena [K92.1] 02/27/2012  . Midsternal chest pain [R07.89] 07/18/2011  . CAD (coronary artery disease) [I25.10] 07/18/2011  . Hypertension [I10]   . Hyperlipidemia [E78.5]   . Ischemic cardiomyopathy [I25.5]   . Type II diabetes mellitus ([place]) [E11.9]   . Microscopic colitis [K52.839] 07/04/2011  . Anemia [D64.9] 06/03/2011  . Bronchitis, chronic ([place]) [J42] 06/02/2011  . Cardiomyopathy ([place]) [I42.9] 08/26/2010  . Esophageal dysphagia [R13.10] 07/26/2010  . Diarrhea [R19.7] 07/26/2010  . COLITIS [K52.89] 10/23/2009  . Malignant neoplasm of colon ([place]) [C18.9] 08/13/2009  . History of Helicobacter pylori infection [Z86.19] 08/13/2009  . Osteoarthrosis, unspecified whether generalized or localized, lower leg [M17.10] 05/30/2009    Past Medical History Past Medical History:  Diagnosis Date  . Adenocarcinoma ([place]) 2003   COLON  . Anemia   . Angina   . Anxiety   . Asthma   . CHF (congestive heart failure) ([facility])    chronic diastolic  . COPD (chronic obstructive pulmonary disease) ([place])   . Coronary artery stenosis    a. 06/2011 Cath: LM 20, LAD min irregs, D1 95 - small,  LCX 60p, RCA 39m 50d, PLA 50ost;  b. 06/2011 PCI/Rota  RCA  -> 3.0x220mPromus Element DES  . Dementia 05/27/2016  . Diverticula of colon   . Dysphagia   . Esophageal dysmotility   . GERD (gastroesophageal reflux disease)   . H. pylori infection 03/01/12   treated with prevpac  . Hiatal hernia 08/05/10   egd with Dr. RoGala Romney. Hyperlipidemia   . Hyperplastic polyp of intestine   .  Hypertension   . Hypothyroidism   . Internal hemorrhoids   . Ischemic cardiomyopathy    a. 05/2011 Echo: EF 45-50%, inf/post HK  . Kidney stone   . Microscopic colitis   . Mobility impaired    wheelchair dependent  . On supplemental oxygen therapy    oxygen @ 2 l/m nasally  . Osteoarthritis    OF KNEES  . Pancreatic mass 05/28/2015   neoplasm of pancreas  . Pneumonia ~ 2005; 09/2005  . PVD  (peripheral vascular disease) ([place])    ABI 0.78 RIGHT AND 0.80 LEFT 4/11  . Schatzki's ring 08/05/10   egd with Dr. Gala Romney  . Shortness of breath 07/17/11   "laying down"  . Tubular adenoma   . Type II diabetes mellitus ([place])     Past Surgical History Past Surgical History:  Procedure Laterality Date  . APPENDECTOMY    . ATHERECTOMY  07/17/11  . CATARACT EXTRACTION, BILATERAL    . COLON SURGERY    . COLONOSCOPY  04/2001   Dr. Laural Golden- colon carcinoma  . COLONOSCOPY  09/04/09   Dr. Matthew Folks, hemorrhoids  . COLONOSCOPY  03/11/2012   Dr. Sabino Gasser R hemicolectomy, pancolonic diverticulosis, tubular adenoma, hyperplastic polyp  . CORONARY ANGIOPLASTY  07/17/11   rotablator  . ESOPHAGOGASTRODUODENOSCOPY  04/2001   Dr. Laural Golden  . ESOPHAGOGASTRODUODENOSCOPY  08/05/10   Dr. Evalee Mutton ring, hiatal hernia  . ESOPHAGOGASTRODUODENOSCOPY  03/01/2012   Dr. Gala Romney- gastric erosions, small hiatal hernia, +hpylori,=treated with prevpac  . ESOPHAGOGASTRODUODENOSCOPY N/A 08/11/2013   FXO:VANVBT Schatzki's ring- s/p dilation as described above. Some retained gastric contents-query delay in gastric emptying. Abnormal gastric mucosa- s/p gastric biopsy (chronic gastritis, intestional metaplasia, no definite h.pylori)  . EUS N/A 06/14/2015   Procedure: UPPER ENDOSCOPIC ULTRASOUND (EUS) LINEAR;  Surgeon: Milus Banister, MD;  Location: [facility] ENDOSCOPY;  Service: Endoscopy;  Laterality: N/A;  . HEMICOLECTOMY  2003  . MALONEY DILATION N/A 08/11/2013   Procedure: Venia Minks DILATION;  Surgeon: Daneil Dolin, MD;  Location: [facility] ENDO SUITE;  Service: Endoscopy;  Laterality: N/A;  . PERCUTANEOUS CORONARY STENT INTERVENTION (PCI-S) N/A 07/17/2011   Procedure: PERCUTANEOUS CORONARY STENT INTERVENTION (PCI-S);  Surgeon: Burnell Blanks, MD;  Location: [facility] CATH LAB;  Service: Cardiovascular;  Laterality: N/A;  . SAVORY DILATION N/A 08/11/2013   Procedure: SAVORY DILATION;  Surgeon: Daneil Dolin, MD;  Location: [facility]  ENDO SUITE;  Service: Endoscopy;  Laterality: N/A;    Family History Family History  Problem Relation Age of Onset  . Coronary artery disease Father   . Heart Problems Mother   . Coronary artery disease Brother        CABG  . Diabetes Brother   . Coronary artery disease Brother      Social History  reports that he quit smoking about 9 years ago. His smoking use included cigarettes. He quit after 20.00 years of use. He has never used smokeless tobacco. He reports that he does not drink alcohol or use drugs.  Medications  Current Outpatient Medications:  .  albuterol (PROVENTIL) (2.5 MG/3ML) 0.083% nebulizer solution, Take 2.5 mg by nebulization every 6 (six) hours., Disp: , Rfl:  .  budesonide-formoterol (SYMBICORT) 160-4.5 MCG/ACT inhaler, Inhale 2 puffs into the lungs 2 (two) times daily., Disp: , Rfl:  .  carvedilol (COREG) 3.125 MG tablet, Take 3.125 mg by mouth 2 (two) times daily with a meal. , Disp: , Rfl:  .  Cinnamon 500 MG TABS, Take 500 mg by mouth daily., Disp: ,  Rfl:  .  clopidogrel (PLAVIX) 75 MG tablet, Take 1 tablet (75 mg total) by mouth daily., Disp: 90 tablet, Rfl: 3 .  dexlansoprazole (DEXILANT) 60 MG capsule, Take 60 mg by mouth daily., Disp: , Rfl:  .  DULoxetine (CYMBALTA) 60 MG capsule, Take 60 mg by mouth daily., Disp: , Rfl:  .  gabapentin (NEURONTIN) 100 MG capsule, Take 100 mg by mouth at bedtime., Disp: , Rfl:  .  guaiFENesin (MUCINEX) 600 MG 12 hr tablet, Take 600 mg by mouth 2 (two) times daily., Disp: , Rfl:  .  HYDROcodone-acetaminophen (NORCO/VICODIN) 5-325 MG tablet, Take 1 tablet by mouth every 8 (eight) hours., Disp: , Rfl:  .  insulin aspart (NOVOLOG) 100 UNIT/ML injection, Inject 5 Units into the skin 3 (three) times daily before meals., Disp: , Rfl:  .  insulin glargine (LANTUS) 100 UNIT/ML injection, Inject 22 Units into the skin at bedtime. , Disp: , Rfl:  .  ipratropium-albuterol (DUONEB) 0.5-2.5 (3) MG/3ML SOLN, Take 3 mLs by nebulization  every 6 (six) hours as needed (wheezing/shortness of breathe.)., Disp: , Rfl:  .  levothyroxine (SYNTHROID, LEVOTHROID) 50 MCG tablet, Take 75 mcg by mouth daily before breakfast. , Disp: , Rfl:  .  linaclotide (LINZESS) 145 MCG CAPS capsule, Take 145 mcg by mouth daily before breakfast., Disp: , Rfl:  .  lisinopril (PRINIVIL,ZESTRIL) 2.5 MG tablet, Take 2.5 mg by mouth daily., Disp: , Rfl:  .  LORazepam (ATIVAN) 0.5 MG tablet, Take 0.5 mg by mouth daily. , Disp: , Rfl:  .  meclizine (ANTIVERT) 12.5 MG tablet, Take 12.5 mg by mouth 2 (two) times daily. , Disp: , Rfl:  .  OXYGEN, Inhale 2 L into the lungs continuous., Disp: , Rfl:  .  Polyethyl Glycol-Propyl Glycol (SYSTANE) 0.4-0.3 % SOLN, Apply 1 drop to eye., Disp: , Rfl:  .  polyethylene glycol (MIRALAX / GLYCOLAX) packet, Take 17 g by mouth daily., Disp: , Rfl:  .  potassium chloride SA (K-DUR,KLOR-CON) 20 MEQ tablet, Take 20 mEq by mouth daily., Disp: , Rfl:  .  Propylene Glycol (SYSTANE BALANCE) 0.6 % SOLN, Apply 1 drop to eye 3 (three) times daily. , Disp: , Rfl:  .  temazepam (RESTORIL) 7.5 MG capsule, Take 7.5 mg by mouth at bedtime as needed for sleep., Disp: , Rfl:  .  tiotropium (SPIRIVA) 18 MCG inhalation capsule, Place 18 mcg into inhaler and inhale daily., Disp: , Rfl:  .  torsemide (DEMADEX) 20 MG tablet, Take 2 tablets (40 mg total) by mouth daily., Disp: 60 tablet, Rfl: 6 .  Vitamin D, Ergocalciferol, (DRISDOL) 50000 units CAPS capsule, Take 50,000 Units by mouth every 7 (seven) days., Disp: , Rfl:   Allergies Patient has no known allergies.  Review of Systems Review of Systems - Oncology ROS as per HPI otherwise 12 point ROS is negative.   Physical Exam  Vitals Wt Readings from Last 3 Encounters:  05/14/17 187 lb 14.4 oz (85.2 kg)  11/10/16 118 lb (53.5 kg)  09/03/16 198 lb 12.8 oz (90.2 kg)   Temp Readings from Last 3 Encounters:  05/14/17 97.7 F (36.5 C) (Oral)  11/11/16 97.6 F (36.4 C) (Oral)  11/10/16 (!)  97.5 F (36.4 C) (Oral)   BP Readings from Last 3 Encounters:  05/14/17 (!) 144/71  11/11/16 134/61  11/10/16 (!) 124/56   Pulse Readings from Last 3 Encounters:  05/14/17 80  11/11/16 87  11/10/16 75    Constitutional: Well-developed, well-nourished, and in no  distress.   HENT: Head: Normocephalic and atraumatic.  Mouth/Throat: No oropharyngeal exudate. Mucosa moist. Eyes: Pupils are equal, round, and reactive to light. Conjunctivae are normal. No scleral icterus.  Neck: Normal range of motion. Neck supple. No JVD present.  Cardiovascular: Normal rate, regular rhythm and normal heart sounds.  Exam reveals no gallop and no friction rub.   No murmur heard. Pulmonary/Chest: Effort normal and breath sounds normal. No respiratory distress. No wheezes.No rales.  Abdominal: Soft. Bowel sounds are normal. No distension. There is no tenderness. There is no guarding.  Musculoskeletal: No edema or tenderness.  Lymphadenopathy:No cervical, axillary  or supraclavicular adenopathy.  Neurological: Alert and oriented to person, place, and time. No cranial nerve deficit.  Skin: Skin is warm and dry. No rash noted. No erythema. No pallor.  Psychiatric: Affect and judgment normal.   Labs Appointment on 05/14/2017  Component Date Value Ref Range Status  . WBC 05/14/2017 8.7  4.0 - 10.5 K/uL Final  . RBC 05/14/2017 3.15* 4.22 - 5.81 MIL/uL Final  . Hemoglobin 05/14/2017 9.9* 13.0 - 17.0 g/dL Final  . HCT 05/14/2017 32.9* 39.0 - 52.0 % Final  . MCV 05/14/2017 104.4* 78.0 - 100.0 fL Final  . MCH 05/14/2017 31.4  26.0 - 34.0 pg Final  . MCHC 05/14/2017 30.1  30.0 - 36.0 g/dL Final  . RDW 05/14/2017 15.7* 11.5 - 15.5 % Final  . Platelets 05/14/2017 192  150 - 400 K/uL Final  . Neutrophils Relative % 05/14/2017 64  % Final  . Neutro Abs 05/14/2017 5.5  1.7 - 7.7 K/uL Final  . Lymphocytes Relative 05/14/2017 15  % Final  . Lymphs Abs 05/14/2017 1.3  0.7 - 4.0 K/uL Final  . Monocytes Relative  05/14/2017 12  % Final  . Monocytes Absolute 05/14/2017 1.1* 0.1 - 1.0 K/uL Final  . Eosinophils Relative 05/14/2017 9  % Final  . Eosinophils Absolute 05/14/2017 0.8* 0.0 - 0.7 K/uL Final  . Basophils Relative 05/14/2017 0  % Final  . Basophils Absolute 05/14/2017 0.0  0.0 - 0.1 K/uL Final   Performed at [facility], [address], [place], [place] [address]  . Sodium 05/14/2017 137  135 - 145 mmol/L Final  . Potassium 05/14/2017 4.0  3.5 - 5.1 mmol/L Final  . Chloride 05/14/2017 96* 101 - 111 mmol/L Final  . CO2 05/14/2017 31  22 - 32 mmol/L Final  . Glucose, Bld 05/14/2017 136* 65 - 99 mg/dL Final  . BUN 05/14/2017 30* 6 - 20 mg/dL Final  . Creatinine, Ser 05/14/2017 1.27* 0.61 - 1.24 mg/dL Final  . Calcium 05/14/2017 9.2  8.9 - 10.3 mg/dL Final  . Total Protein 05/14/2017 6.9  6.5 - 8.1 g/dL Final  . Albumin 05/14/2017 3.6  3.5 - 5.0 g/dL Final  . AST 05/14/2017 19  15 - 41 U/L Final  . ALT 05/14/2017 14* 17 - 63 U/L Final  . Alkaline Phosphatase 05/14/2017 70  38 - 126 U/L Final  . Total Bilirubin 05/14/2017 0.5  0.3 - 1.2 mg/dL Final  . GFR calc non Af Amer 05/14/2017 49* >60 mL/min Final  . GFR calc Af Amer 05/14/2017 57* >60 mL/min Final   Comment: (NOTE) The eGFR has been calculated using the CKD EPI equation. This calculation has not been validated in all clinical situations. eGFR's persistently <60 mL/min signify possible Chronic Kidney Disease.   Georgiann Hahn gap 05/14/2017 10  5 - 15 Final   Performed at [facility], [address],  [place], [place] [address]  . Iron 05/14/2017 58  45 - 182 ug/dL Final  . TIBC 05/14/2017 347  250 - 450 ug/dL Final  . Saturation Ratios 05/14/2017 17* 17.9 - 39.5 % Final  . UIBC 05/14/2017 289  ug/dL Final   Performed at [facility], [place] [address] [place], [place] [address]  . Erythropoietin 05/14/2017 103.4* 2.6 - 18.5 mIU/mL Final   Comment: (NOTE) [employer] Uni[employer]el DxI [place] obtained with  different assay methods or kits cannot be used interchangeably. Results cannot be interpreted as absolute evidence of the presence or absence of malignant disease. Performed At: [facility] [place], [place] [id number] Rush Farmer MD [id number] Performed at [facility], [address] [place], [place] [address]   . Ferritin 05/14/2017 50  24 - 336 ng/mL Final   Performed at [facility], [place] [address] [place], [place] [address]  . Vitamin B-12 05/14/2017 604  180 - 914 pg/mL Final   Comment: (NOTE) This assay is not validated for testing neonatal or myeloproliferative syndrome specimens for Vitamin B12 levels. Performed at [facility], [place] [address] [place], [place] [address]   . CA 19-9 05/14/2017 45* 0 - 35 U/mL Final   Comment: (NOTE) Roche [employer]ostics Electrochemiluminescence Immunoassay (ECLIA) Values obtained with different assay methods or kits cannot be used interchangeably.  Results cannot be interpreted as absolute evidence of the presence or absence of malignant disease. Performed At: [facility] [place], [place] [id number] Rush Farmer MD [id number] Performed at [facility], [address] [place],  [address]      Pathology Orders Placed This Encounter  Procedures  . CT Abdomen Pelvis W Wo Contrast    Standing Status:   Future    Standing Expiration Date:   05/15/2018    Order Specific Question:   If indicated for the ordered procedure, I authorize the administration of contrast media per Radiology protocol    Answer:   Yes    Order Specific Question:   Preferred imaging location?    Answer:   [facility]    Order Specific Question:   Radiology Contrast Protocol - do NOT remove file path    Answer:   \\[email or web address]  . CT Chest W Contrast    Standing Status:   Future    Standing Expiration Date:   05/14/2018    Order Specific Question:   If  indicated for the ordered procedure, I authorize the administration of contrast media per Radiology protocol    Answer:   Yes    Order Specific Question:   Preferred imaging location?    Answer:   [facility]    Order Specific Question:   Radiology Contrast Protocol - do NOT remove file path    Answer:   [email or web address]       Zoila Shutter MD

## 2017-07-23 ENCOUNTER — Encounter: Payer: Self-pay | Admitting: Gastroenterology

## 2017-07-23 ENCOUNTER — Ambulatory Visit (INDEPENDENT_AMBULATORY_CARE_PROVIDER_SITE_OTHER): Payer: Medicare Other | Admitting: Gastroenterology

## 2017-07-23 VITALS — BP 122/78 | HR 77 | Temp 97.0°F | Ht 63.0 in

## 2017-07-23 DIAGNOSIS — K5900 Constipation, unspecified: Secondary | ICD-10-CM

## 2017-07-23 DIAGNOSIS — Z85038 Personal history of other malignant neoplasm of large intestine: Secondary | ICD-10-CM | POA: Diagnosis not present

## 2017-07-23 NOTE — Patient Instructions (Signed)
Continue Linzess once daily.   We will see you back in 1 year!  No colonoscopy unless you have overt GI bleeding or other concerning symptoms.  It was a pleasure to see you today. I strive to create trusting relationships with patients to provide genuine, compassionate, and quality care. I value your feedback. If you receive a survey regarding your visit,  I greatly appreciate you taking time to fill this out.   Annitta Needs, PhD, ANP-BC [facility]

## 2017-07-23 NOTE — Progress Notes (Signed)
Primary Care Physician:  [name], MD  Primary GI: Dr. Gala Romney   Chief Complaint  Patient presents with  . Constipation    f/u.    HPI:   Howard [name] is a 84 y.o. male presenting today with a history of colon cancer with last TCS in 2014 and surveillance to be considered in 2019. Followed by Oncology for pancreatic mass suspicious for IPMN, Clinically stable on CT abd/pelvis on 07/29/16. He will be set up for CT CAP in 07/2017 for interval follow-up. History of gallbladder polyp in past. Last [place] RUQ in Oct 2018 with CBD diameter 8.4 mm, mildly increased from prior imaging. Hepatic cysts noted.    Weight 183 Jul 01, 2017 per nursing records from [place]. Linzess 145 mcg daily. He is in a wheelchair, limited mobility. Very confused today but quite pleasant. Unable to illicit much information from him appropriate for visit. Caretaker present with him but does not know how he is doing.    Past Medical History:  Diagnosis Date  . Adenocarcinoma ([place]) 2003   COLON  . Anemia   . Angina   . Anxiety   . Asthma   . CHF (congestive heart failure) ([facility])    chronic diastolic  . COPD (chronic obstructive pulmonary disease) ([place])   . Coronary artery stenosis    a. 06/2011 Cath: LM 20, LAD min irregs, D1 95 - small,  LCX 60p, RCA 9m, 50d, PLA 50ost;  b. 06/2011 PCI/Rota  RCA  -> 3.0x49mm Promus Element DES  . Dementia 05/27/2016  . Diverticula of colon   . Dysphagia   . Esophageal dysmotility   . GERD (gastroesophageal reflux disease)   . H. pylori infection 03/01/12   treated with prevpac  . Hiatal hernia 08/05/10   egd with Dr. Gala Romney  . Hyperlipidemia   . Hyperplastic polyp of intestine   . Hypertension   . Hypothyroidism   . Internal hemorrhoids   . Ischemic cardiomyopathy    a. 05/2011 Echo: EF 45-50%, inf/post HK  . Kidney stone   . Microscopic colitis   . Mobility impaired    wheelchair dependent  . On supplemental oxygen therapy    oxygen @ 2 l/m nasally  .  Osteoarthritis    OF KNEES  . Pancreatic mass 05/28/2015   neoplasm of pancreas  . Pneumonia ~ 2005; 09/2005  . PVD (peripheral vascular disease) ([place])    ABI 0.78 RIGHT AND 0.80 LEFT 4/11  . Schatzki's ring 08/05/10   egd with Dr. Gala Romney  . Shortness of breath 07/17/11   "laying down"  . Tubular adenoma   . Type II diabetes mellitus ([place])     Past Surgical History:  Procedure Laterality Date  . APPENDECTOMY    . ATHERECTOMY  07/17/11  . CATARACT EXTRACTION, BILATERAL    . COLON SURGERY    . COLONOSCOPY  04/2001   Dr. Laural Golden- colon carcinoma  . COLONOSCOPY  09/04/09   Dr. Matthew Folks, hemorrhoids  . COLONOSCOPY  03/11/2012   Dr. Sabino Gasser R hemicolectomy, pancolonic diverticulosis, tubular adenoma, hyperplastic polyp  . CORONARY ANGIOPLASTY  07/17/11   rotablator  . ESOPHAGOGASTRODUODENOSCOPY  04/2001   Dr. Laural Golden  . ESOPHAGOGASTRODUODENOSCOPY  08/05/10   Dr. Evalee Mutton ring, hiatal hernia  . ESOPHAGOGASTRODUODENOSCOPY  03/01/2012   Dr. Gala Romney- gastric erosions, small hiatal hernia, +hpylori,=treated with prevpac  . ESOPHAGOGASTRODUODENOSCOPY N/A 08/11/2013   WJX:[id number] Schatzki's ring- s/p dilation as described above. Some retained gastric contents-query delay in gastric  emptying. Abnormal gastric mucosa- s/p gastric biopsy (chronic gastritis, intestional metaplasia, no definite h.pylori)  . EUS N/A 06/14/2015   Procedure: UPPER ENDOSCOPIC ULTRASOUND (EUS) LINEAR;  Surgeon: Milus Banister, MD;  Location: [facility];  Service: Endoscopy;  Laterality: N/A;  . HEMICOLECTOMY  2003  . MALONEY DILATION N/A 08/11/2013   Procedure: Venia Minks DILATION;  Surgeon: Daneil Dolin, MD;  Location: [facility];  Service: Endoscopy;  Laterality: N/A;  . PERCUTANEOUS CORONARY STENT INTERVENTION (PCI-S) N/A 07/17/2011   Procedure: PERCUTANEOUS CORONARY STENT INTERVENTION (PCI-S);  Surgeon: Burnell Blanks, MD;  Location: [facility] CATH LAB;  Service: Cardiovascular;  Laterality: N/A;  . SAVORY  DILATION N/A 08/11/2013   Procedure: SAVORY DILATION;  Surgeon: Daneil Dolin, MD;  Location: [facility];  Service: Endoscopy;  Laterality: N/A;    Current Outpatient Medications  Medication Sig Dispense Refill  . acetaminophen (TYLENOL) 650 MG CR tablet Take 650 mg by mouth every 8 (eight) hours as needed for pain.    Marland Kitchen albuterol (PROVENTIL) (2.5 MG/3ML) 0.083% nebulizer solution Take 2.5 mg by nebulization every 6 (six) hours.    . budesonide-formoterol (SYMBICORT) 160-4.5 MCG/ACT inhaler Inhale 2 puffs into the lungs 2 (two) times daily.    . busPIRone (BUSPAR) 5 MG tablet Take 5 mg by mouth 2 (two) times daily.    . carvedilol (COREG) 3.125 MG tablet Take 3.125 mg by mouth 2 (two) times daily with a meal.     . clopidogrel (PLAVIX) 75 MG tablet Take 1 tablet (75 mg total) by mouth daily. 90 tablet 3  . dexlansoprazole (DEXILANT) 60 MG capsule Take 60 mg by mouth daily.    . diclofenac sodium (VOLTAREN) 1 % GEL Apply topically 2 (two) times daily.    . DULoxetine (CYMBALTA) 30 MG capsule Take 30 mg by mouth daily.     Marland Kitchen gabapentin (NEURONTIN) 100 MG capsule Take 200 mg by mouth at bedtime.     Marland Kitchen guaiFENesin (MUCINEX) 600 MG 12 hr tablet Take 600 mg by mouth 2 (two) times daily.    Marland Kitchen HYDROcodone-acetaminophen (NORCO/VICODIN) 5-325 MG tablet Take 1 tablet by mouth every 8 (eight) hours.    . insulin aspart (NOVOLOG) 100 UNIT/ML injection Inject 5 Units into the skin 3 (three) times daily before meals.    . insulin glargine (LANTUS) 100 UNIT/ML injection Inject 22 Units into the skin at bedtime.     Marland Kitchen levothyroxine (SYNTHROID, LEVOTHROID) 50 MCG tablet Take 75 mcg by mouth daily before breakfast.     . linaclotide (LINZESS) 145 MCG CAPS capsule Take 145 mcg by mouth daily before breakfast.    . lisinopril (PRINIVIL,ZESTRIL) 2.5 MG tablet Take 2.5 mg by mouth daily.    Marland Kitchen LORazepam (ATIVAN) 0.5 MG tablet Take 0.25 mg by mouth daily.     . meclizine (ANTIVERT) 12.5 MG tablet Take 12.5 mg by  mouth 2 (two) times daily.     . mirtazapine (REMERON) 15 MG tablet Take 15 mg by mouth at bedtime.    . OXYGEN Inhale 2 L into the lungs continuous.    Vladimir Faster Glycol-Propyl Glycol (SYSTANE) 0.4-0.3 % SOLN Apply 1 drop to eye.    . potassium chloride SA (K-DUR,KLOR-CON) 20 MEQ tablet Take 20 mEq by mouth daily.    Marland Kitchen Propylene Glycol (SYSTANE BALANCE) 0.6 % SOLN Apply 1 drop to eye 3 (three) times daily.     Marland Kitchen tiotropium (SPIRIVA) 18 MCG inhalation capsule Place 18 mcg into inhaler and inhale daily.    Marland Kitchen  torsemide (DEMADEX) 20 MG tablet Take 2 tablets (40 mg total) by mouth daily. 60 tablet 6  . Vitamin D, Ergocalciferol, (DRISDOL) 50000 units CAPS capsule Take 50,000 Units by mouth every 7 (seven) days.     No current facility-administered medications for this visit.     Allergies as of 07/23/2017  . (No Known Allergies)    Family History  Problem Relation Age of Onset  . Coronary artery disease Father   . Heart Problems Mother   . Coronary artery disease Brother        CABG  . Diabetes Brother   . Coronary artery disease Brother     Social History   Socioeconomic History  . Marital status: Married    Spouse name: Not on file  . Number of children: Not on file  . Years of education: Not on file  . Highest education level: Not on file  Occupational History  . Not on file  Social Needs  . Financial resource strain: Not on file  . Food insecurity:    Worry: Not on file    Inability: Not on file  . Transportation needs:    Medical: Not on file    Non-medical: Not on file  Tobacco Use  . Smoking status: Former Smoker    Years: 20.00    Types: Cigarettes    Last attempt to quit: 02/25/2008    Years since quitting: 9.4  . Smokeless tobacco: Never Used  Substance and Sexual Activity  . Alcohol use: No    Alcohol/week: 0.0 oz  . Drug use: No  . Sexual activity: Not Currently  Lifestyle  . Physical activity:    Days per week: Not on file    Minutes per session:  Not on file  . Stress: Not on file  Relationships  . Social connections:    Talks on phone: Not on file    Gets together: Not on file    Attends religious service: Not on file    Active member of club or organization: Not on file    Attends meetings of clubs or organizations: Not on file    Relationship status: Not on file  Other Topics Concern  . Not on file  Social History Narrative   Lives in [place]. Married. Wife in [place].   Son lives with him   Caffeine Use-    Review of Systems: Limited due to confusion   Physical Exam: BP 122/78   Pulse 77   Temp (!) 97 F (36.1 C) (Oral)   Ht 5\' 3"  (1.6 m)   BMI 33.28 kg/m  General:   Alert and oriented to person only  Head:  Normocephalic and atraumatic. Eyes:  Conjuctiva clear without scleral icterus. Mouth:  Oral mucosa pink and moist.  Abdomen:  +BS, soft, obese, non-tender and non-distended. No rebound or guarding. Limited exam with patient in chair.  Neurologic: pleasantly confused

## 2017-07-29 ENCOUNTER — Other Ambulatory Visit (HOSPITAL_COMMUNITY): Payer: Self-pay | Admitting: *Deleted

## 2017-07-29 DIAGNOSIS — K8689 Other specified diseases of pancreas: Secondary | ICD-10-CM

## 2017-07-30 ENCOUNTER — Inpatient Hospital Stay (HOSPITAL_COMMUNITY): Payer: Medicare Other | Attending: Hematology

## 2017-07-30 DIAGNOSIS — K869 Disease of pancreas, unspecified: Secondary | ICD-10-CM | POA: Insufficient documentation

## 2017-07-30 DIAGNOSIS — D631 Anemia in chronic kidney disease: Secondary | ICD-10-CM | POA: Insufficient documentation

## 2017-07-30 DIAGNOSIS — R978 Other abnormal tumor markers: Secondary | ICD-10-CM | POA: Insufficient documentation

## 2017-07-30 DIAGNOSIS — I129 Hypertensive chronic kidney disease with stage 1 through stage 4 chronic kidney disease, or unspecified chronic kidney disease: Secondary | ICD-10-CM | POA: Insufficient documentation

## 2017-07-30 DIAGNOSIS — N183 Chronic kidney disease, stage 3 (moderate): Secondary | ICD-10-CM | POA: Diagnosis not present

## 2017-07-30 DIAGNOSIS — K8689 Other specified diseases of pancreas: Secondary | ICD-10-CM

## 2017-07-30 LAB — CBC WITH DIFFERENTIAL/PLATELET
BASOS PCT: 0 %
Basophils Absolute: 0 10*3/uL (ref 0.0–0.1)
EOS ABS: 0.7 10*3/uL (ref 0.0–0.7)
Eosinophils Relative: 7 %
HEMATOCRIT: 35.7 % — AB (ref 39.0–52.0)
Hemoglobin: 10.9 g/dL — ABNORMAL LOW (ref 13.0–17.0)
Lymphocytes Relative: 16 %
Lymphs Abs: 1.5 10*3/uL (ref 0.7–4.0)
MCH: 30.6 pg (ref 26.0–34.0)
MCHC: 30.5 g/dL (ref 30.0–36.0)
MCV: 100.3 fL — ABNORMAL HIGH (ref 78.0–100.0)
MONO ABS: 1 10*3/uL (ref 0.1–1.0)
MONOS PCT: 10 %
Neutro Abs: 6.4 10*3/uL (ref 1.7–7.7)
Neutrophils Relative %: 67 %
Platelets: 191 10*3/uL (ref 150–400)
RBC: 3.56 MIL/uL — ABNORMAL LOW (ref 4.22–5.81)
RDW: 16.3 % — AB (ref 11.5–15.5)
WBC: 9.6 10*3/uL (ref 4.0–10.5)

## 2017-07-30 LAB — VITAMIN B12: Vitamin B-12: 520 pg/mL (ref 180–914)

## 2017-07-30 LAB — COMPREHENSIVE METABOLIC PANEL
ALBUMIN: 3.7 g/dL (ref 3.5–5.0)
ALT: 15 U/L — ABNORMAL LOW (ref 17–63)
ANION GAP: 8 (ref 5–15)
AST: 19 U/L (ref 15–41)
Alkaline Phosphatase: 72 U/L (ref 38–126)
BILIRUBIN TOTAL: 0.6 mg/dL (ref 0.3–1.2)
BUN: 25 mg/dL — ABNORMAL HIGH (ref 6–20)
CO2: 30 mmol/L (ref 22–32)
Calcium: 9.3 mg/dL (ref 8.9–10.3)
Chloride: 102 mmol/L (ref 101–111)
Creatinine, Ser: 1.29 mg/dL — ABNORMAL HIGH (ref 0.61–1.24)
GFR calc non Af Amer: 48 mL/min — ABNORMAL LOW (ref >60)
GFR, EST AFRICAN AMERICAN: 56 mL/min — AB (ref >60)
GLUCOSE: 160 mg/dL — AB (ref 65–99)
POTASSIUM: 4 mmol/L (ref 3.5–5.1)
SODIUM: 140 mmol/L (ref 135–145)
TOTAL PROTEIN: 6.7 g/dL (ref 6.5–8.1)

## 2017-07-30 LAB — FERRITIN: Ferritin: 42 ng/mL (ref 24–336)

## 2017-07-30 LAB — IRON AND TIBC
Iron: 65 ug/dL (ref 45–182)
SATURATION RATIOS: 20 % (ref 17.9–39.5)
TIBC: 330 ug/dL (ref 250–450)
UIBC: 265 ug/dL

## 2017-07-30 NOTE — Assessment & Plan Note (Signed)
Remote history, due for surveillance this year if health permits. He has declined over the past few visits and is very confused but pleasant. I am unable to obtain any real information pertaining to his health, and I feel a colonoscopy at this time would have more risks than benefits in light of his health. Will hold off on invasive procedure unless significant clinical changes.

## 2017-07-30 NOTE — Assessment & Plan Note (Signed)
Continue Linzess 145 mcg once daily. Return in 1 year.

## 2017-07-31 LAB — ERYTHROPOIETIN: Erythropoietin: 22.5 m[IU]/mL — ABNORMAL HIGH (ref 2.6–18.5)

## 2017-07-31 LAB — CANCER ANTIGEN 19-9: CAN 19-9: 46 U/mL — AB (ref 0–35)

## 2017-07-31 NOTE — Progress Notes (Signed)
CC'D TO PCP °

## 2017-08-06 ENCOUNTER — Ambulatory Visit (HOSPITAL_COMMUNITY)
Admission: RE | Admit: 2017-08-06 | Discharge: 2017-08-06 | Disposition: A | Discharge status: Home / Self Care | Payer: Medicare Other | Source: Ambulatory Visit | Attending: Internal Medicine | Admitting: Internal Medicine

## 2017-08-06 DIAGNOSIS — K862 Cyst of pancreas: Secondary | ICD-10-CM | POA: Diagnosis not present

## 2017-08-06 DIAGNOSIS — R1907 Generalized intra-abdominal and pelvic swelling, mass and lump: Secondary | ICD-10-CM | POA: Diagnosis not present

## 2017-08-06 DIAGNOSIS — Z85038 Personal history of other malignant neoplasm of large intestine: Secondary | ICD-10-CM | POA: Insufficient documentation

## 2017-08-06 DIAGNOSIS — K7689 Other specified diseases of liver: Secondary | ICD-10-CM | POA: Diagnosis not present

## 2017-08-06 DIAGNOSIS — Z9049 Acquired absence of other specified parts of digestive tract: Secondary | ICD-10-CM | POA: Insufficient documentation

## 2017-08-06 DIAGNOSIS — I700 Atherosclerosis of aorta: Secondary | ICD-10-CM | POA: Diagnosis not present

## 2017-08-06 MED ORDER — IOPAMIDOL (ISOVUE-300) INJECTION 61%
100.0000 mL | Freq: Once | INTRAVENOUS | Status: AC | PRN
  Administered 2017-08-06: 100 mL via INTRAVENOUS

## 2017-08-11 ENCOUNTER — Ambulatory Visit (HOSPITAL_COMMUNITY): Payer: Medicare Other | Admitting: Internal Medicine

## 2017-09-03 ENCOUNTER — Other Ambulatory Visit: Payer: Self-pay

## 2017-09-03 ENCOUNTER — Encounter (HOSPITAL_COMMUNITY): Payer: Self-pay | Admitting: Internal Medicine

## 2017-09-03 ENCOUNTER — Inpatient Hospital Stay (HOSPITAL_COMMUNITY): Payer: Medicare Other | Attending: Internal Medicine | Admitting: Internal Medicine

## 2017-09-03 DIAGNOSIS — N183 Chronic kidney disease, stage 3 (moderate): Secondary | ICD-10-CM | POA: Diagnosis not present

## 2017-09-03 DIAGNOSIS — Z79899 Other long term (current) drug therapy: Secondary | ICD-10-CM | POA: Diagnosis not present

## 2017-09-03 DIAGNOSIS — Z85038 Personal history of other malignant neoplasm of large intestine: Secondary | ICD-10-CM | POA: Diagnosis not present

## 2017-09-03 DIAGNOSIS — I129 Hypertensive chronic kidney disease with stage 1 through stage 4 chronic kidney disease, or unspecified chronic kidney disease: Secondary | ICD-10-CM | POA: Insufficient documentation

## 2017-09-03 DIAGNOSIS — I5032 Chronic diastolic (congestive) heart failure: Secondary | ICD-10-CM

## 2017-09-03 DIAGNOSIS — R109 Unspecified abdominal pain: Secondary | ICD-10-CM | POA: Diagnosis not present

## 2017-09-03 DIAGNOSIS — K7689 Other specified diseases of liver: Secondary | ICD-10-CM | POA: Diagnosis not present

## 2017-09-03 DIAGNOSIS — D631 Anemia in chronic kidney disease: Secondary | ICD-10-CM | POA: Diagnosis not present

## 2017-09-03 DIAGNOSIS — K5900 Constipation, unspecified: Secondary | ICD-10-CM | POA: Insufficient documentation

## 2017-09-03 DIAGNOSIS — I10 Essential (primary) hypertension: Secondary | ICD-10-CM | POA: Diagnosis not present

## 2017-09-03 DIAGNOSIS — Z87891 Personal history of nicotine dependence: Secondary | ICD-10-CM | POA: Diagnosis not present

## 2017-09-03 DIAGNOSIS — E119 Type 2 diabetes mellitus without complications: Secondary | ICD-10-CM | POA: Diagnosis not present

## 2017-09-03 DIAGNOSIS — K8689 Other specified diseases of pancreas: Secondary | ICD-10-CM

## 2017-09-03 DIAGNOSIS — K219 Gastro-esophageal reflux disease without esophagitis: Secondary | ICD-10-CM | POA: Diagnosis not present

## 2017-09-03 DIAGNOSIS — K869 Disease of pancreas, unspecified: Secondary | ICD-10-CM | POA: Insufficient documentation

## 2017-09-03 DIAGNOSIS — I2510 Atherosclerotic heart disease of native coronary artery without angina pectoris: Secondary | ICD-10-CM | POA: Diagnosis not present

## 2017-09-03 DIAGNOSIS — F0390 Unspecified dementia without behavioral disturbance: Secondary | ICD-10-CM | POA: Insufficient documentation

## 2017-09-03 DIAGNOSIS — E785 Hyperlipidemia, unspecified: Secondary | ICD-10-CM | POA: Diagnosis not present

## 2017-09-04 NOTE — Progress Notes (Signed)
Diagnosis Anemia in stage 3 chronic kidney disease ([place]) - Plan: CBC with Differential/Platelet, Comprehensive metabolic panel, Lactate dehydrogenase, Ferritin, Cancer antigen 19-9, CEA, CANCELED: CBC with Differential/Platelet, CANCELED: Comprehensive metabolic panel, CANCELED: Lactate dehydrogenase, CANCELED: Ferritin, CANCELED: CEA, CANCELED: Cancer antigen 19-9  Pancreatic mass - Plan: CBC with Differential/Platelet, Comprehensive metabolic panel, Lactate dehydrogenase, Ferritin, Cancer antigen 19-9, CEA, CANCELED: CBC with Differential/Platelet, CANCELED: Comprehensive metabolic panel, CANCELED: Lactate dehydrogenase, CANCELED: Ferritin, CANCELED: CEA, CANCELED: Cancer antigen 19-9  Staging Cancer Staging No matching staging information was found for the patient.  Assessment and Plan:  1. 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  Pt was previously followed by Dr. Talbert Cage.    Pt is here to go over CT CAP that was done 08/06/2017 which was reviewed with pt and showed  IMPRESSION: 1. Stable appearing cystic lesion in the pancreatic head, likely a benign process and possible postinflammatory change. Small cysts also noted in the pancreatic tail. No pancreatic ductal dilatation or peripancreatic adenopathy. 2. Stable small hepatic cysts. No worrisome hepatic lesions to suggest metastatic disease. 3. Status post right hemicolectomy for colon cancer. No findings for adenopathy or metastatic disease. 4. Severe atherosclerotic calcifications involving the thoracic and abdominal aorta and branch vessels. 5. Stable bony changes as detailed above.  Pt was felt to be a poor surgical candidate.  He will RTC in 1 yr for follow-up and repeat labs.  CA 19-9 stable at 46.    2. Macrocytic anemia. Felt multifactorial from CKD.  Labs done 08/06/2017 reviewed with pt and showed WBC 9.6 HB  10.9 Plts 191,000.  He will have repeat labs in 1 yr.  B12 level is normal.  Ferritin 42.    3.  HTN.  BP is 111/46.  Follow-up with PCP for monitoring.    4  Dementia.   Pt is accompanied by Caregiver and resides in nursing facility.    Interval History:  Historical data obtained from note dated 05/14/2017.  84 yr old male previously followed by Dr. 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 Stable on recent CT abd/pelvis on 07/29/16.   Current Status:  Pt is seen today for follow-up.  He is here to go over scans.  He is accompanied by caregiver.    Problem List Patient Active Problem List   Diagnosis Date Noted  . History of colon cancer [Z85.038] 07/23/2017  . Dementia [F03.90] 05/27/2016  . Constipation [K59.00] 03/06/2016  . Goals of care, counseling/discussion [Z71.89] 11/27/2015  . Pancreatic mass [K86.9] 05/28/2015  . Abdominal pain [R10.9] 04/19/2015  . HCAP (healthcare-associated pneumonia) [J18.9] 12/03/2013  . Acute on chronic respiratory failure ([place]) [J96.20] 12/03/2013  . Toxic metabolic encephalopathy [V37] 12/03/2013  . Chronic diastolic CHF (congestive heart failure) ([place]) [I50.32] 12/03/2013  . Leukocytosis [D72.829] 12/03/2013  . GERD (gastroesophageal reflux disease) [K21.9] 11/17/2012  . Fall at home [W19.Merril Abbe, S82.707] 08/18/2012  . Generalized weakness [R53.1] 08/18/2012  . Dehydration [E86.0] 08/18/2012  . Melena [K92.1] 02/27/2012  . Midsternal chest pain [R07.89] 07/18/2011  . CAD (coronary artery disease) [I25.10] 07/18/2011  . Hypertension [I10]   . Hyperlipidemia [E78.5]   . Ischemic cardiomyopathy [I25.5]   . Type II diabetes mellitus ([place]) [E11.9]   . Microscopic colitis [K52.839] 07/04/2011  . Anemia [D64.9] 06/03/2011  . Bronchitis, chronic (  [place]) [J42] 06/02/2011  . Cardiomyopathy ([place]) [I42.9] 08/26/2010  . Esophageal  dysphagia [R13.10] 07/26/2010  . Diarrhea [R19.7] 07/26/2010  . COLITIS [K52.89] 10/23/2009  . Malignant neoplasm of colon ([place]) [C18.9] 08/13/2009  . History of Helicobacter pylori infection [Z86.19] 08/13/2009  . Osteoarthrosis, unspecified whether generalized or localized, lower leg [M17.10] 05/30/2009    Past Medical History Past Medical History:  Diagnosis Date  . Adenocarcinoma ([place]) 2003   COLON  . Anemia   . Angina   . Anxiety   . Asthma   . CHF (congestive heart failure) (H[facility])    chronic diastolic  . COPD (chronic obstructive pulmonary disease) ([place] [facility])   . Coronary artery stenosis    a. 06/2011 Cath: LM 20, LAD min irregs, D1 95 - small,  LCX 60p, RCA 68m 50d, PLA 50ost;  b. 06/2011 PCI/Rota  RCA  -> 3.0x269mPromus Element DES  . Dementia 05/27/2016  . Diverticula of colon   . Dysphagia   . Esophageal dysmotility   . GERD (gastroesophageal reflux disease)   . H. pylori infection 03/01/12   treated with prevpac  . Hiatal hernia 08/05/10   egd with Dr. RoGala Romney. Hyperlipidemia   . Hyperplastic polyp of intestine   . Hypertension   . Hypothyroidism   . Internal hemorrhoids   . Ischemic cardiomyopathy    a. 05/2011 Echo: EF 45-50%, inf/post HK  . Kidney stone   . Microscopic colitis   . Mobility impaired    wheelchair dependent  . On supplemental oxygen therapy    oxygen @ 2 l/m nasally  . Osteoarthritis    OF KNEES  . Pancreatic mass 05/28/2015   neoplasm of pancreas  . Pneumonia ~ 2005; 09/2005  . PVD (peripheral vascular disease) ([facility]   ABI 0.78 RIGHT AND 0.80 LEFT 4/11  . Schatzki's ring 08/05/10   egd with Dr. RoGala Romney. Shortness of breath 07/17/11   "laying down"  . Tubular adenoma   . Type II diabetes mellitus ([facility]    Past Surgical History Past Surgical History:  Procedure Laterality Date  . APPENDECTOMY    . ATHERECTOMY  07/17/11  . CATARACT EXTRACTION, BILATERAL    . COLON SURGERY    . COLONOSCOPY  04/2001   Dr. ReLaural Goldencolon carcinoma  .  COLONOSCOPY  09/04/09   Dr. RoMatthew Folkshemorrhoids  . COLONOSCOPY  03/11/2012   Dr. RoSabino Gasser hemicolectomy, pancolonic diverticulosis, tubular adenoma, hyperplastic polyp  . CORONARY ANGIOPLASTY  07/17/11   rotablator  . ESOPHAGOGASTRODUODENOSCOPY  04/2001   Dr. ReLaural Golden. ESOPHAGOGASTRODUODENOSCOPY  08/05/10   Dr. RoEvalee Muttoning, hiatal hernia  . ESOPHAGOGASTRODUODENOSCOPY  03/01/2012   Dr. RoGala Romneygastric erosions, small hiatal hernia, +hpylori,=treated with prevpac  . ESOPHAGOGASTRODUODENOSCOPY N/A 08/11/2013   RMIRS:WNIOEVchatzki's ring- s/p dilation as described above. Some retained gastric contents-query delay in gastric emptying. Abnormal gastric mucosa- s/p gastric biopsy (chronic gastritis, intestional metaplasia, no definite h.pylori)  . EUS N/A 06/14/2015   Procedure: UPPER ENDOSCOPIC ULTRASOUND (EUS) LINEAR;  Surgeon: DaMilus BanisterMD;  Location: [facility];  Service: Endoscopy;  Laterality: N/A;  . HEMICOLECTOMY  2003  . MALONEY DILATION N/A 08/11/2013   Procedure: MAVenia MinksILATION;  Surgeon: RoDaneil DolinMD;  Location[facility];  Service: Endoscopy;  Laterality: N/A;  . PERCUTANEOUS CORONARY STENT INTERVENTION (PCI-S) N/A 07/17/2011   Procedure: PERCUTANEOUS CORONARY STENT INTERVENTION (PCI-S);  Surgeon: ChBurnell BlanksMD;  [facility];  Service: Cardiovascular;  Laterality:  N/A;  . Azzie Almas DILATION N/A 08/11/2013   Procedure: SAVORY DILATION;  Surgeon: Daneil Dolin, MD;  Location: [facility];  Service: Endoscopy;  Laterality: N/A;    Family History Family History  Problem Relation Age of Onset  . Coronary artery disease Father   . Heart Problems Mother   . Coronary artery disease Brother        CABG  . Diabetes Brother   . Coronary artery disease Brother      Social History  reports that he quit smoking about 9 years ago. His smoking use included cigarettes. He quit after 20.00 years of use. He has never used smokeless tobacco.  He reports that he does not drink alcohol or use drugs.  Medications  Current Outpatient Medications:  .  acetaminophen (TYLENOL) 650 MG CR tablet, Take 650 mg by mouth every 8 (eight) hours as needed for pain., Disp: , Rfl:  .  albuterol (PROVENTIL) (2.5 MG/3ML) 0.083% nebulizer solution, Take 2.5 mg by nebulization every 6 (six) hours., Disp: , Rfl:  .  budesonide-formoterol (SYMBICORT) 160-4.5 MCG/ACT inhaler, Inhale 2 puffs into the lungs 2 (two) times daily., Disp: , Rfl:  .  busPIRone (BUSPAR) 5 MG tablet, Take 5 mg by mouth 2 (two) times daily., Disp: , Rfl:  .  carvedilol (COREG) 3.125 MG tablet, Take 3.125 mg by mouth 2 (two) times daily with a meal. , Disp: , Rfl:  .  clopidogrel (PLAVIX) 75 MG tablet, Take 1 tablet (75 mg total) by mouth daily., Disp: 90 tablet, Rfl: 3 .  dexlansoprazole (DEXILANT) 60 MG capsule, Take 60 mg by mouth daily., Disp: , Rfl:  .  diclofenac sodium (VOLTAREN) 1 % GEL, Apply topically 2 (two) times daily., Disp: , Rfl:  .  DULoxetine (CYMBALTA) 30 MG capsule, Take 30 mg by mouth daily. , Disp: , Rfl:  .  gabapentin (NEURONTIN) 100 MG capsule, Take 200 mg by mouth at bedtime. , Disp: , Rfl:  .  guaiFENesin (MUCINEX) 600 MG 12 hr tablet, Take 600 mg by mouth 2 (two) times daily., Disp: , Rfl:  .  HYDROcodone-acetaminophen (NORCO/VICODIN) 5-325 MG tablet, Take 1 tablet by mouth every 8 (eight) hours., Disp: , Rfl:  .  insulin aspart (NOVOLOG) 100 UNIT/ML injection, Inject 5 Units into the skin 3 (three) times daily before meals., Disp: , Rfl:  .  insulin glargine (LANTUS) 100 UNIT/ML injection, Inject 22 Units into the skin at bedtime. , Disp: , Rfl:  .  levothyroxine (SYNTHROID, LEVOTHROID) 50 MCG tablet, Take 75 mcg by mouth daily before breakfast. , Disp: , Rfl:  .  linaclotide (LINZESS) 145 MCG CAPS capsule, Take 145 mcg by mouth daily before breakfast., Disp: , Rfl:  .  lisinopril (PRINIVIL,ZESTRIL) 2.5 MG tablet, Take 2.5 mg by mouth daily., Disp: , Rfl:   .  LORazepam (ATIVAN) 0.5 MG tablet, Take 0.25 mg by mouth daily. , Disp: , Rfl:  .  meclizine (ANTIVERT) 12.5 MG tablet, Take 12.5 mg by mouth 2 (two) times daily. , Disp: , Rfl:  .  mirtazapine (REMERON) 15 MG tablet, Take 15 mg by mouth at bedtime., Disp: , Rfl:  .  OXYGEN, Inhale 2 L into the lungs continuous., Disp: , Rfl:  .  Polyethyl Glycol-Propyl Glycol (SYSTANE) 0.4-0.3 % SOLN, Apply 1 drop to eye., Disp: , Rfl:  .  potassium chloride SA (K-DUR,KLOR-CON) 20 MEQ tablet, Take 20 mEq by mouth daily., Disp: , Rfl:  .  Propylene Glycol (SYSTANE BALANCE) 0.6 %  SOLN, Apply 1 drop to eye 3 (three) times daily. , Disp: , Rfl:  .  tiotropium (SPIRIVA) 18 MCG inhalation capsule, Place 18 mcg into inhaler and inhale daily., Disp: , Rfl:  .  torsemide (DEMADEX) 20 MG tablet, Take 2 tablets (40 mg total) by mouth daily., Disp: 60 tablet, Rfl: 6 .  Vitamin D, Ergocalciferol, (DRISDOL) 50000 units CAPS capsule, Take 50,000 Units by mouth every 7 (seven) days., Disp: , Rfl:   Allergies Patient has no known allergies.  Review of Systems Review of Systems - Oncology ROS negative   Physical Exam   Vitals:  T 98 HR 81 RR 18 BP 111/46 pulse ox 96% on room air  Wt Readings from Last 3 Encounters:  05/14/17 187 lb 14.4 oz (85.2 kg)  11/10/16 118 lb (53.5 kg)  09/03/16 198 lb 12.8 oz (90.2 kg)   Temp Readings from Last 3 Encounters:  07/23/17 (!) 97 F (36.1 C) (Oral)  05/14/17 97.7 F (36.5 C) (Oral)  11/11/16 97.6 F (36.4 C) (Oral)   BP Readings from Last 3 Encounters:  07/23/17 122/78  05/14/17 (!) 144/71  11/11/16 134/61   Pulse Readings from Last 3 Encounters:  07/23/17 77  05/14/17 80  11/11/16 87    Constitutional: Well-developed, well-nourished, and in no distress.  Chronically ill appearing male seated in wheelchair.   HENT: Head: Normocephalic and atraumatic.  Mouth/Throat: No oropharyngeal exudate. Mucosa moist. Eyes: Pupils are equal, round, and reactive to light.  Conjunctivae are normal. No scleral icterus.  Neck: Normal range of motion. Neck supple. No JVD present.  Cardiovascular: Normal rate, regular rhythm and normal heart sounds.  Exam reveals no gallop and no friction rub.   No murmur heard. Pulmonary/Chest: Effort normal and breath sounds normal. No respiratory distress. No wheezes.No rales.  Abdominal: Soft. Bowel sounds are normal. No distension. There is no tenderness. There is no guarding.  Musculoskeletal: No edema or tenderness.  Lymphadenopathy: No cervical, axillary or supraclavicular adenopathy.  Neurological: Alert and oriented to person, place, and time. No cranial nerve deficit.  Skin: Skin is warm and dry. No rash noted. No erythema. No pallor.  Psychiatric: Affect and judgment normal.   Labs No visits with results within 3 Day(s) from this visit.  Latest known visit with results is:  Appointment on 07/30/2017  Component Date Value Ref Range Status  . WBC 07/30/2017 9.6  4.0 - 10.5 K/uL Final  . RBC 07/30/2017 3.56* 4.22 - 5.81 MIL/uL Final  . Hemoglobin 07/30/2017 10.9* 13.0 - 17.0 g/dL Final  . HCT 07/30/2017 35.7* 39.0 - 52.0 % Final  . MCV 07/30/2017 100.3* 78.0 - 100.0 fL Final  . MCH 07/30/2017 30.6  26.0 - 34.0 pg Final  . MCHC 07/30/2017 30.5  30.0 - 36.0 g/dL Final  . RDW 07/30/2017 16.3* 11.5 - 15.5 % Final  . Platelets 07/30/2017 191  150 - 400 K/uL Final  . Neutrophils Relative % 07/30/2017 67  % Final  . Neutro Abs 07/30/2017 6.4  1.7 - 7.7 K/uL Final  . Lymphocytes Relative 07/30/2017 16  % Final  . Lymphs Abs 07/30/2017 1.5  0.7 - 4.0 K/uL Final  . Monocytes Relative 07/30/2017 10  % Final  . Monocytes Absolute 07/30/2017 1.0  0.1 - 1.0 K/uL Final  . Eosinophils Relative 07/30/2017 7  % Final  . Eosinophils Absolute 07/30/2017 0.7  0.0 - 0.7 K/uL Final  . Basophils Relative 07/30/2017 0  % Final  . Basophils Absolute 07/30/2017 0.0  0.0 - 0.1 K/uL Final   Performed at [facility], [address],  [place], [place] [address]  . Sodium 07/30/2017 140  135 - 145 mmol/L Final  . Potassium 07/30/2017 4.0  3.5 - 5.1 mmol/L Final  . Chloride 07/30/2017 102  101 - 111 mmol/L Final  . CO2 07/30/2017 30  22 - 32 mmol/L Final  . Glucose, Bld 07/30/2017 160* 65 - 99 mg/dL Final  . BUN 07/30/2017 25* 6 - 20 mg/dL Final  . Creatinine, Ser 07/30/2017 1.29* 0.61 - 1.24 mg/dL Final  . Calcium 07/30/2017 9.3  8.9 - 10.3 mg/dL Final  . Total Protein 07/30/2017 6.7  6.5 - 8.1 g/dL Final  . Albumin 07/30/2017 3.7  3.5 - 5.0 g/dL Final  . AST 07/30/2017 19  15 - 41 U/L Final  . ALT 07/30/2017 15* 17 - 63 U/L Final  . Alkaline Phosphatase 07/30/2017 72  38 - 126 U/L Final  . Total Bilirubin 07/30/2017 0.6  0.3 - 1.2 mg/dL Final  . GFR calc non Af Amer 07/30/2017 48* >60 mL/min Final  . GFR calc Af Amer 07/30/2017 56* >60 mL/min Final   Comment: (NOTE) The eGFR has been calculated using the CKD EPI equation. This calculation has not been validated in all clinical situations. eGFR's persistently <60 mL/min signify possible Chronic Kidney Disease.   Georgiann Hahn gap 07/30/2017 8  5 - 15 Final   Performed at [facility], [address] [place], [place] [address]  . Erythropoietin 07/30/2017 22.5* 2.6 - 18.5 mIU/mL Final   Comment: (NOTE) [employer] UniCel DxI [place] obtained with different assay methods or kits cannot be used interchangeably. Results cannot be interpreted as absolute evidence of the presence or absence of malignant disease. Performed At: [facility], [place] [id number] Rush Farmer MD [id number] Performed at [facility], [address] [place], [place] [address]   . Iron 07/30/2017 65  45 - 182 ug/dL Final  . TIBC 07/30/2017 330  250 - 450 ug/dL Final  . Saturation Ratios 07/30/2017 20  17.9 - 39.5 % Final  . UIBC 07/30/2017 265  ug/dL Final   Performed at [facility] [address], [place], [place] [address]  .  Ferritin 07/30/2017 42  24 - 336 ng/mL Final   Performed at [facility] [address] [place], [place] [address]  . Vitamin B-12 07/30/2017 520  180 - 914 pg/mL Final   Comment: (NOTE) This assay is not validated for testing neonatal or myeloproliferative syndrome specimens for Vitamin B12 levels. Performed at [facility], [place] [address], [place], [place] [address]   . CA 19-9 07/30/2017 46* 0 - 35 U/mL Final   Comment: (NOTE) Roche [employer]ostics Electrochemiluminescence Immunoassay (ECLIA) Values obtained with different assay methods or kits cannot be used interchangeably.  Results cannot be interpreted as absolute evidence of the presence or absence of malignant disease. Performed At: [facility] [place], [place] [id number] Rush Farmer MD [id number] Performed at [facility], [address], [place], [place] [address]      Pathology Orders Placed This Encounter  Procedures  . CBC with Differential/Platelet    Standing Status:   Future    Standing Expiration Date:   09/04/2019  . Comprehensive metabolic panel    Standing Status:   Future    Standing Expiration Date:   09/04/2019  . Lactate dehydrogenase    Standing Status:  Future    Standing Expiration Date:   09/04/2019  . Ferritin    Standing Status:   Future    Standing Expiration Date:   09/04/2019  . Cancer antigen 19-9    Standing Status:   Future    Standing Expiration Date:   09/04/2019  . CEA    Standing Status:   Future    Standing Expiration Date:   09/04/2019       Zoila Shutter MD

## 2017-10-14 ENCOUNTER — Telehealth: Payer: Self-pay | Admitting: Internal Medicine

## 2017-10-14 DIAGNOSIS — K824 Cholesterolosis of gallbladder: Secondary | ICD-10-CM

## 2017-10-14 NOTE — Telephone Encounter (Signed)
OCT RECALL FOR ULTRASOUND  °

## 2017-10-14 NOTE — Telephone Encounter (Signed)
Letter mailed

## 2017-10-22 NOTE — Telephone Encounter (Signed)
Howard [name], patient had CT abd/pelvis 07/2017. Does he still need the u/s? thanks

## 2017-10-23 NOTE — Telephone Encounter (Signed)
No, there was no obvious gallbladder polyp on last ultrasound. Will follow clinically for now.

## 2017-10-23 NOTE — Telephone Encounter (Signed)
Left message for carol making aware of below

## 2017-10-23 NOTE — Telephone Encounter (Signed)
Altha Harm from [facility] called to schedule U/S. [phone]. Called but NA to make aware.

## 2018-07-01 ENCOUNTER — Encounter: Payer: Self-pay | Admitting: Internal Medicine

## 2018-09-09 ENCOUNTER — Other Ambulatory Visit (HOSPITAL_COMMUNITY): Payer: Medicare Other

## 2018-09-16 ENCOUNTER — Ambulatory Visit (HOSPITAL_COMMUNITY): Payer: Medicare Other | Admitting: Hematology

## 2018-10-26 DEATH — deceased
